# Patient Record
Sex: Female | Born: 1950 | Race: White | Hispanic: No | Smoking: Never smoker
Health system: Southern US, Community
[De-identification: ages and names within clinical notes are randomized; demographics above are authoritative.]

## PROBLEM LIST (undated history)

## (undated) DIAGNOSIS — W5911XA Bitten by nonvenomous snake, initial encounter: Secondary | ICD-10-CM

## (undated) DIAGNOSIS — K649 Unspecified hemorrhoids: Secondary | ICD-10-CM

## (undated) DIAGNOSIS — E876 Hypokalemia: Secondary | ICD-10-CM

## (undated) DIAGNOSIS — F200 Paranoid schizophrenia: Secondary | ICD-10-CM

## (undated) DIAGNOSIS — I10 Essential (primary) hypertension: Secondary | ICD-10-CM

## (undated) DIAGNOSIS — E2740 Unspecified adrenocortical insufficiency: Secondary | ICD-10-CM

## (undated) DIAGNOSIS — R531 Weakness: Secondary | ICD-10-CM

## (undated) DIAGNOSIS — C541 Malignant neoplasm of endometrium: Secondary | ICD-10-CM

## (undated) DIAGNOSIS — B377 Candidal sepsis: Secondary | ICD-10-CM

## (undated) DIAGNOSIS — G4700 Insomnia, unspecified: Secondary | ICD-10-CM

## (undated) DIAGNOSIS — I8291 Chronic embolism and thrombosis of unspecified vein: Secondary | ICD-10-CM

## (undated) HISTORY — PX: TUBAL LIGATION: SHX77

---

## 2002-02-19 ENCOUNTER — Emergency Department (HOSPITAL_COMMUNITY): Admission: EM | Admit: 2002-02-19 | Discharge: 2002-02-19 | Payer: Self-pay | Admitting: Emergency Medicine

## 2002-10-17 ENCOUNTER — Inpatient Hospital Stay (HOSPITAL_COMMUNITY): Admission: AD | Admit: 2002-10-17 | Discharge: 2002-10-26 | Payer: Self-pay | Admitting: Psychiatry

## 2002-11-02 ENCOUNTER — Encounter: Admission: RE | Admit: 2002-11-02 | Discharge: 2002-11-02 | Payer: Self-pay | Admitting: Psychiatry

## 2003-11-26 ENCOUNTER — Inpatient Hospital Stay (HOSPITAL_COMMUNITY): Admission: RE | Admit: 2003-11-26 | Discharge: 2003-12-07 | Payer: Self-pay | Admitting: Psychiatry

## 2009-07-12 ENCOUNTER — Emergency Department (HOSPITAL_BASED_OUTPATIENT_CLINIC_OR_DEPARTMENT_OTHER): Admission: EM | Admit: 2009-07-12 | Discharge: 2009-07-12 | Payer: Self-pay | Admitting: Emergency Medicine

## 2009-07-13 ENCOUNTER — Ambulatory Visit: Payer: Self-pay | Admitting: Diagnostic Radiology

## 2009-07-13 ENCOUNTER — Emergency Department (HOSPITAL_BASED_OUTPATIENT_CLINIC_OR_DEPARTMENT_OTHER): Admission: EM | Admit: 2009-07-13 | Discharge: 2009-07-13 | Payer: Self-pay | Admitting: Emergency Medicine

## 2010-07-16 LAB — DIFFERENTIAL
Basophils Absolute: 0.1 10*3/uL (ref 0.0–0.1)
Basophils Relative: 1 % (ref 0–1)
Monocytes Relative: 4 % (ref 3–12)
Neutro Abs: 10.4 10*3/uL — ABNORMAL HIGH (ref 1.7–7.7)

## 2010-07-16 LAB — URINALYSIS, ROUTINE W REFLEX MICROSCOPIC
Bilirubin Urine: NEGATIVE
Glucose, UA: NEGATIVE mg/dL
Hgb urine dipstick: NEGATIVE
Protein, ur: NEGATIVE mg/dL
pH: 5 (ref 5.0–8.0)

## 2010-07-16 LAB — COMPREHENSIVE METABOLIC PANEL
AST: 28 U/L (ref 0–37)
Albumin: 4.5 g/dL (ref 3.5–5.2)
BUN: 18 mg/dL (ref 6–23)
CO2: 24 mEq/L (ref 19–32)
Chloride: 106 mEq/L (ref 96–112)
GFR calc non Af Amer: 51 mL/min — ABNORMAL LOW (ref >60)
Glucose, Bld: 113 mg/dL — ABNORMAL HIGH (ref 70–99)
Total Bilirubin: 0.5 mg/dL (ref 0.3–1.2)
Total Protein: 8 g/dL (ref 6.0–8.3)

## 2010-07-16 LAB — CBC
HCT: 41.1 % (ref 36.0–46.0)
Hemoglobin: 13.9 g/dL (ref 12.0–15.0)
MCHC: 33.7 g/dL (ref 30.0–36.0)
MCV: 89.4 fL (ref 78.0–100.0)
RBC: 4.59 MIL/uL (ref 3.87–5.11)

## 2010-07-16 LAB — ETHANOL: Alcohol, Ethyl (B): <5 mg/dL (ref 0–10)

## 2010-07-16 LAB — POCT TOXICOLOGY PANEL

## 2010-09-07 NOTE — H&P (Signed)
NAME:  Emily Duarte, Emily Duarte                    ACCOUNT NO.:  [id number]   MEDICAL RECORD NO.:  [id number]                   PATIENT TYPE:  IPS   LOCATION:  0405                                 FACILITY:  [facility]   PHYSICIAN:  Jeanice Lim, M.D.              DATE OF BIRTH:  [birth year]   DATE OF ADMISSION:  10/17/2002  DATE OF DISCHARGE:                         PSYCHIATRIC ADMISSION ASSESSMENT   IDENTIFYING INFORMATION:  This is a 60 year old single white female  involuntarily committed on October 17, 2002.   HISTORY OF PRESENT ILLNESS:  The patient presents with psychotic symptoms.  Commitment papers report that patient has a history of schizophrenia, has  been noncompliant, is manic, grandiosity, and patient is afraid of people  that she lives with.  The patient states that she seen a mouse in her home  for about a month and this gets her very concerned.  She reports she has  been having these clandestine experiences with the [employer], who had taken her  from her work to [place] on several occasions.  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  She denies any specific  stressors in her life.  She reports that she has been sleeping  satisfactorily.  Her appetite has been fair.  Denies any hallucinations,  auditory or perceiveral hallucinations and denies any current suicidal or  homicidal ideation.   PAST PSYCHIATRIC HISTORY:  Second visit to [facility].  At  [facility] for about 10 months about five years ago, when she states she  was waiting for her disability.  She reports no current outpatient  treatment.  She reports that she was diagnosed as bipolar when she was at  [employer].   SOCIAL HISTORY:  This is a 60 year old single white female.  She has a 13-  year-old son.  She lives alone.  She is retired.  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  She reports no legal problems.   FAMILY HISTORY:  None.   ALCOHOL/DRUG  HISTORY:  She is a nonsmoker.  She states her last beer was  five years ago and denies any recreational drug use.   PRIMARY CARE PHYSICIAN:  None.   MEDICAL PROBLEMS:  None.   MEDICATIONS:  None.  She was on lithium in the past, where she states she  had an out-of-body experience.   ALLERGIES:  No known allergies.   PHYSICAL EXAMINATION:  Done at [facility] Emergency Department.  The patient  presents overweight with no complaints.  Does not appear in any acute  distress.  Last available vital signs were temperature 98.7, heart rate 115,  respirations 22, blood pressure 144/84.  She is 232 pounds.  She is 5 feet 7-  1/2 inches tall.   LABORATORY DATA:  CMET shows glucose 164.  Alcohol level less than 5.  Urine  drug screen was negative.   MENTAL STATUS  EXAM:  She is an alert, cooperative female.  Good eye contact.  Dressed rather [employer].  Speech is clear with some flight of ideas.  She is  articulate and hyperverbal.  Mood, patient states she feels great.  Her  affect is constricted.  Thought processes are with paranoid ideation,  grandiose and with some delusions.  Does not appear to be responding to  internal stimuli.  No suicidal and homicidal thoughts.  Cognitive function  intact.  Memory is fair.  Judgment is fair.  Insight is poor.   DIAGNOSES:   AXIS I:  1. Psychosis not otherwise specified.  2. Rule out bipolar, manic.   AXIS II:  Deferred.   AXIS III:  None.   AXIS IV:  Other psychosocial problems.   AXIS V:  Current 25; estimated this past year 72.   PLAN:  Involuntary commitment for psychotic symptoms, manic behavior.  Contract for safety.  Check every 15 minutes.  Will stabilize mood and  thinking so patient can be safe and functional.  Will initiate an  antipsychotic to decrease psychotic symptoms and add a mood stabilizer for  mood stability.  The patient is to be medication-compliant.  Will have a  family session with son prior to discharge.  The patient  is to follow up  with mental health.   TENTATIVE LENGTH OF STAY:  Three to five days.     Landry Corporal, N.P.                       Jeanice Lim, M.D.    JO/MEDQ  D:  10/18/2002  T:  10/18/2002  Job:  [id number]

## 2010-09-07 NOTE — Discharge Summary (Signed)
NAME:  LAQUETA, BONAVENTURA                    ACCOUNT NO.:  [id number]   MEDICAL RECORD NO.:  [id number]                   PATIENT TYPE:  IPS   LOCATION:  0405                                 FACILITY:  [facility]   PHYSICIAN:  Geoffery Lyons, M.D.                   DATE OF BIRTH:  10-04-1950   DATE OF ADMISSION:  10/17/2002  DATE OF DISCHARGE:  10/26/2002                                 DISCHARGE SUMMARY   CHIEF COMPLAINT AND PRESENT ILLNESS:  This was the first admission to [facility] for this 60 year old single white female  involuntarily committed.  Presented with psychotic symptoms.  History of a  psychotic disorder.  Noncompliant.  Evidenced grandiosity, some paranoia  associated to the people that she was living with.  She stated that she saw  a mouse in her home for about a month.  This got her very concerned having  claimed that there is some clandestine experience with the FBI who had taken  her from her work to [place] on several occasions.  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  Denies any hallucinations or  suicidal or homicidal ideation.   PAST PSYCHIATRIC HISTORY:  [facility] for about 10 months five years ago.  Diagnosed bipolar when she was there.   ALCOHOL/DRUG HISTORY:  Denies the use or abuse of any substances.   PAST MEDICAL HISTORY:  Noncontributory.   MEDICATIONS:  Had been on lithium in the past.   PHYSICAL EXAMINATION:  Performed and failed to show any acute findings.   MENTAL STATUS EXAMINATION:  Alert, cooperative female with good eye contact.  Dressed rather [employer].  Speech is clear.  Some flight of ideas.  Articulate.  Hyperverbal.  Mood claims she feels great.  Her affect is constricted.  Some  pressured speech can be interrupted.  Thought processes with evidence of  paranoid ideation, some grandiosity.  Did not appear to be responding to  internal stimuli.  Denied any suicidal or homicidal ideation.   Cognition  well-preserved.   ADMISSION DIAGNOSES:   AXIS I:  Rule out schizoaffective disorder.   AXIS II:  No diagnosis.   AXIS III:  No diagnosis.   AXIS IV:  Moderate.   AXIS V:  Global Assessment of Functioning upon admission 25; highest Global  Assessment of Functioning in the last year 60.   LABORATORY DATA:  Blood chemistry within normal limits.  Thyroid profile  within normal limits.   HOSPITAL COURSE:  She was admitted and started intensive individual and  group psychotherapy.  She was placed on Risperdal and Depakote.  Depakote  dose was adjusted up to 500 mg twice a day and Risperdal was increased as  tolerated up to 4 mg at night.  She initially was evidencing pressured  speech, circumstantiality, flight of ideas, matter of fact discourse,  grandiosity, paranoia, wanting to reopen her case at work.  Initially a lot  of thoughts coming to her mind.  Issues having to do with her son's in-laws.  Worried about placement, going back to the apartment where she saw the mouse  versus a new place.  Maybe some insight that she could not control her  thinking.  Still grandiose, having special abilities, high IQ, out of the  ordinary ability to persist, extraordinary life experiences, concerned about  conflict with the son as he felt that he was going to be in the middle  between his wife and the in-laws and her.  Had a session with the son that  went well.  He was supportive.  He found another place for her.  As the  hospitalization progressed and we adjusted her medication, gradually she  started to get better.  The underlying paranoia seemed to be baseline and,  according to the son, she was basically at her baseline.  Willing to  continue her medications.  Looking forward to a new place to live.  On September 26, 2002, she was much better.  She admitted that she was thinking clearer.  Was going to stay on the medications.  Issues with the daughter-in-law still  present but she  was amiable to maybe consider working things out.   DISCHARGE DIAGNOSES:   AXIS I:  Schizoaffective disorder, manic.   AXIS II:  No diagnosis.   AXIS III:  No diagnosis.   AXIS IV:  Moderate.   AXIS V:  Global Assessment of Functioning upon discharge 55.   DISCHARGE MEDICATIONS:  1. Depakote ER 500 mg twice a day.  2. Risperdal 4 mg at night.   FOLLOW UP:  [facility].                                               Geoffery Lyons, M.D.    IL/MEDQ  D:  11/24/2002  T:  11/24/2002  Job:  [id number]

## 2010-09-07 NOTE — Discharge Summary (Signed)
NAME:  Emily Duarte, Emily Duarte                    ACCOUNT NO.:  [id number]   MEDICAL RECORD NO.:  [id number]                   PATIENT TYPE:  IPS   LOCATION:  0401                                 FACILITY:  [facility]   PHYSICIAN:  Geoffery Lyons, M.D.                   DATE OF BIRTH:  10-22-1950   DATE OF ADMISSION:  11/26/2003  DATE OF DISCHARGE:  12/07/2003                                 DISCHARGE SUMMARY   CHIEF COMPLAINT AND PRESENT ILLNESS:  This was the second admission to [facility] for this 60 year old divorced white female  voluntarily admitted.  Presented for admission walking to [facility].  Claimed I'm here to get calmed down.  Risperdal did such a great  job last year.  Currently manic, delusional, pressured speech, loose  associations, paranoid ideas.  Can be cooperative.  Does need mental health  treatment.  She was reporting positive suicidal ideation with no plan.  About a week after her prior admission here, she quit her medications.  She  thinks that snakes are trying to get her.  Stated that she did  ______________ for [employer].  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  She was an inpatient at [facility] some time  ago in 1999.  She was inpatient at [facility] on October 17, 2002 to October 26, 2002.  Discharged on Risperdal and Depakote that she has not been taking.   PAST PSYCHIATRIC HISTORY:  As already stated, last hospitalization June and  July of 2004.  Discharged stable on Risperdal and Depakote.  She did not  comply.   ALCOHOL/DRUG HISTORY:  Denies the use or abuse of any substances.   PAST MEDICAL HISTORY:  Noncontributory.   MEDICATIONS:  None.   PHYSICAL EXAMINATION:  Performed and failed to show any acute findings.   MENTAL STATUS EXAM:  Alert female, somewhat unkempt, oriented, pressured  speech, circumstantial, tangential, flight of ideas, delusional, some  underlying paranoia, some  grandiosity but no evidence of hallucinations.  Cognition well-preserved.   ADMISSION DIAGNOSES:   AXIS I:  Schizoaffective disorder versus bipolar disorder with psychotic  features.   AXIS II:  No diagnosis.   AXIS III:  No diagnosis.   AXIS IV:  Moderate.   AXIS V:  Global Assessment of Functioning upon admission 25-30; highest  Global Assessment of Functioning in the last year 70.   LABORATORY DATA:  CBC within normal limits.  Blood chemistry within normal  limits.  Hemoglobin A1C 5.4.  Triglycerides 310.   HOSPITAL COURSE:  She was admitted and started in individual and group  psychotherapy.  She was given Ambien for sleep, Risperdal 0.25 mg twice a  day and 0.5 mg at night.  Risperdal was changed to 2 mg M-Tab at night.  That was increased to 2.5 mg and then 3 mg.  It was later increased to  Risperdal M-Tab 3.5 mg and she was started on Depakote ER 500 mg at bedtime.  The Risperdal was finally increased to 4 mg at night and Depakote to 750 mg.  Initially, endorsed difficulties and conflicts with her daughter-in-law.  Said that she went off medications and she was doing very well.  As of  recently, she claimed that she was bit by a snake.  There has been multiple  events in her thought content.  There is evidence of paranoia, grandiose  ideas, a lot of changing delusional content, some pressured speech.  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  Thought there was a plan to kidnap her and ask for  money.  Apparently, the daughter-in-law and her family was involved in on  it.  There was some developed paranoid ideation, pressured speech,  grandiosity.  But as we started increasing the medications as already  described, she definitely got better.  She started sleeping better.  She was  less pressured.  Got to a point where she could not find things to talk  about.  There was a family session with the son.  She was  agreeable to allow  her son in her life and wanted to give the daughter-in-law another chance.  She was challenging her own misperceptions and willing to look at the way  she was looking at things.  On August 11, 200t, she was much better, in more  contact with reality, marked decrease in her delusional ideas, no suicidal  ideation, no homicidal ideation, no hallucinations, marked decrease in the  delusions, willing to continue to be in a relationship with the son and the  son's wife and willing to continue taking her medications.   DISCHARGE DIAGNOSES:   AXIS I:  Schizoaffective disorder.   AXIS II:  No diagnosis.   AXIS III:  No diagnosis.   AXIS IV:  Moderate.   AXIS V:  Global Assessment of Functioning upon discharge 50-55.   DISCHARGE MEDICATIONS:  1.  Depakote ER 750 mg at night.  2.  Risperdal 4 mg at bedtime.   FOLLOW UP:  Dr. Lolly Mustache at [facility].                                               Geoffery Lyons, M.D.    IL/MEDQ  D:  12/28/2003  T:  12/30/2003  Job:  [id number]

## 2010-09-07 NOTE — H&P (Signed)
NAME:  Emily Duarte, SIEVERS                    ACCOUNT NO.:  [id number]   MEDICAL RECORD NO.:  [id number]                   PATIENT TYPE:  IPS   LOCATION:  0406                                 FACILITY:  [facility]   PHYSICIAN:  Vic Ripper, P.A.-C.         DATE OF BIRTH:  17-Sep-1950   DATE OF ADMISSION:  11/26/2003  DATE OF DISCHARGE:                         PSYCHIATRIC ADMISSION ASSESSMENT   PATIENT IDENTIFICATION:  This is a 60 year old divorced white female.  This  is a voluntary admission.   HISTORY OF PRESENT ILLNESS:  The patient apparently presented for admission  as a walk in yesterday here at [facility].  She stated, I'm  here to get calmed down.  Risperdal did such a great job last year.  She is  currently manic.  She is delusional with pressured speech, loose  associations, and paranoid ideation.  She can be cooperative.  She realizes  that she does need mental health treatment.  Yesterday, she was reporting  passive suicidal ideation with no plan.  She states that about a week after  her prior admission here, she quit her medications.  She thinks that snakes  are trying to get her.  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  Apparently, she was an inpatient at [facility] some time ago, it  states 1999.  She is willing to sign a release to get those records.  She  was an inpatient here October 17, 2002, to October 26, 2002.  She discharged on  Risperdal and Depakote but she has not been taking.  She denies any family  history for mental illness.   SUBSTANCE ABUSE HISTORY:  She denies any drug or substance abuse.   PAST MEDICAL HISTORY:  She denies having a primary care Jairo Bellew.  She said  Dr. _________ was her gynecologist years ago.  He has retired.  She denied  any medical problems and she denied being prescribed any medication.   DRUG ALLERGIES:  She also has no  known drug allergies.   PHYSICAL EXAMINATION:  GENERAL:  She did allow me a cursory physical  examination.  Her heart and lungs were clear to auscultation and percussion.  She had a wig on.  She would not let me look under the wig.  Her glasses  were broken.  But other than being obese, there were no remarkable physical  findings.   MENTAL STATUS EXAM:  She is alert.  She is unkempt.  She is somewhat  oriented.  She knows what year it is and where she is.  Her speech is  pressured.  She is tangential.  She has flight of ideas.  She delusional and  somewhat paranoid.  She points to two little cherry spots on her legs and  claims that this is from a snake.  Her thought process is not clear.  She is  not rational.  She has some judgment and insight.  She knew she could come  here to get help.  Her concentration and memory are poor; due to her flight  of ideas, she keeps changing.  Her intelligence is probably average.   ADMISSION DIAGNOSES:   AXIS I:  Schizoaffective disorder bipolar versus bipolar manic.   AXIS II:  Deferred.   AXIS III:  Obese.   AXIS IV:  Moderate to severe.   AXIS V:   INITIAL PLAN OF CARE:  The plan is to address her manic symptoms.  She was  started on Risperdal yesterday.  She does not want medication if she can  help it, so she says.  I will increase the dosage of Risperdal tonight to  try to help control her mania.   ESTIMATED LENGTH OF STAY:  Tentative length of stay is probably a week.                                               Vic Ripper, P.A.-C.    MD/MEDQ  D:  11/27/2003  T:  11/28/2003  Job:  [id number]

## 2012-08-06 ENCOUNTER — Encounter (HOSPITAL_COMMUNITY): Payer: Self-pay | Admitting: Emergency Medicine

## 2012-08-06 ENCOUNTER — Emergency Department (HOSPITAL_COMMUNITY)
Admission: EM | Admit: 2012-08-06 | Discharge: 2012-08-06 | Disposition: A | Discharge status: Other Acute Inpatient Hospital | Payer: Medicare Other | Source: Home / Self Care | Attending: Emergency Medicine | Admitting: Emergency Medicine

## 2012-08-06 ENCOUNTER — Inpatient Hospital Stay (HOSPITAL_COMMUNITY)
Admission: AD | Admit: 2012-08-06 | Discharge: 2012-08-14 | DRG: 885 | Disposition: A | Discharge status: Home / Self Care | Payer: Medicare Other | Source: Intra-hospital | Attending: Psychiatry | Admitting: Psychiatry

## 2012-08-06 DIAGNOSIS — L723 Sebaceous cyst: Secondary | ICD-10-CM | POA: Diagnosis present

## 2012-08-06 DIAGNOSIS — L02818 Cutaneous abscess of other sites: Secondary | ICD-10-CM | POA: Insufficient documentation

## 2012-08-06 DIAGNOSIS — F3163 Bipolar disorder, current episode mixed, severe, without psychotic features: Secondary | ICD-10-CM | POA: Diagnosis present

## 2012-08-06 DIAGNOSIS — F3289 Other specified depressive episodes: Secondary | ICD-10-CM | POA: Insufficient documentation

## 2012-08-06 DIAGNOSIS — M7989 Other specified soft tissue disorders: Secondary | ICD-10-CM

## 2012-08-06 DIAGNOSIS — F319 Bipolar disorder, unspecified: Secondary | ICD-10-CM

## 2012-08-06 DIAGNOSIS — H5316 Psychophysical visual disturbances: Secondary | ICD-10-CM | POA: Insufficient documentation

## 2012-08-06 DIAGNOSIS — F22 Delusional disorders: Secondary | ICD-10-CM | POA: Diagnosis present

## 2012-08-06 DIAGNOSIS — L03818 Cellulitis of other sites: Secondary | ICD-10-CM | POA: Insufficient documentation

## 2012-08-06 DIAGNOSIS — F329 Major depressive disorder, single episode, unspecified: Secondary | ICD-10-CM | POA: Insufficient documentation

## 2012-08-06 DIAGNOSIS — Z79899 Other long term (current) drug therapy: Secondary | ICD-10-CM

## 2012-08-06 DIAGNOSIS — F309 Manic episode, unspecified: Secondary | ICD-10-CM

## 2012-08-06 DIAGNOSIS — F3164 Bipolar disorder, current episode mixed, severe, with psychotic features: Principal | ICD-10-CM | POA: Diagnosis present

## 2012-08-06 LAB — COMPREHENSIVE METABOLIC PANEL
CO2: 25 mEq/L (ref 19–32)
GFR calc non Af Amer: 59 mL/min — ABNORMAL LOW (ref >90)
Glucose, Bld: 94 mg/dL (ref 70–99)
Sodium: 137 mEq/L (ref 135–145)
Total Protein: 7.9 g/dL (ref 6.0–8.3)

## 2012-08-06 LAB — ACETAMINOPHEN LEVEL: Acetaminophen (Tylenol), Serum: <15 ug/mL (ref 10–30)

## 2012-08-06 LAB — CBC
HCT: 37.8 % (ref 36.0–46.0)
Hemoglobin: 12.9 g/dL (ref 12.0–15.0)
MCHC: 34.1 g/dL (ref 30.0–36.0)
MCV: 85.9 fL (ref 78.0–100.0)
Platelets: 320 10*3/uL (ref 150–400)
RBC: 4.4 MIL/uL (ref 3.87–5.11)
RDW: 13.4 % (ref 11.5–15.5)
WBC: 8.9 10*3/uL (ref 4.0–10.5)

## 2012-08-06 LAB — RAPID URINE DRUG SCREEN, HOSP PERFORMED
Amphetamines: NOT DETECTED
Barbiturates: NOT DETECTED
Benzodiazepines: NOT DETECTED
Cocaine: NOT DETECTED
Tetrahydrocannabinol: NOT DETECTED

## 2012-08-06 MED ORDER — CEPHALEXIN 250 MG PO CAPS
500.0000 mg | ORAL_CAPSULE | Freq: Three times a day (TID) | ORAL | Status: DC
  Administered 2012-08-06 (×2): 500 mg via ORAL
  Filled 2012-08-06 (×2): qty 2

## 2012-08-06 MED ORDER — ONDANSETRON HCL 8 MG PO TABS: 4.0000 mg | ORAL_TABLET | Freq: Three times a day (TID) | ORAL | Status: DC | PRN

## 2012-08-06 MED ORDER — ACETAMINOPHEN 325 MG PO TABS: 650.0000 mg | ORAL_TABLET | ORAL | Status: DC | PRN

## 2012-08-06 MED ORDER — ALUM & MAG HYDROXIDE-SIMETH 200-200-20 MG/5ML PO SUSP: 30.0000 mL | ORAL | Status: DC | PRN

## 2012-08-06 NOTE — ED Notes (Signed)
Pt here to have cyst checked on her head; pt with son; pt obviously manic acting at present with flight of ideas; per son pt with hx of same; per son pt worsening x 3 weeks; pt sts has secluded herself in bathroom because she feels safe there

## 2012-08-06 NOTE — ED Notes (Signed)
Report given to [employer]. Pt transferred to Pod C.

## 2012-08-06 NOTE — ED Notes (Signed)
Bed avalable in [facility]

## 2012-08-06 NOTE — ED Provider Notes (Signed)
History    This chart was scribed for non-physician practitioner working with Emily Cooper III, MD by Sofie Rower, ED Scribe. This patient was seen in room TR10C/TR10C and the patient's care was started at 15:57PM.   CSN: [id number]  Arrival date & time 08/06/12  1452   First MD Initiated Contact with Patient 08/06/12 1557      Chief Complaint  Patient presents with  . Medical Clearance  . Wound Check    (Consider location/radiation/quality/duration/timing/severity/associated sxs/prior treatment) Patient is a 62 y.o. female presenting with wound check and mental health disorder. The history is provided by the patient. No language interpreter was used.  Wound Check This is a new problem. The current episode started more than 1 week ago. The problem occurs rarely. The problem has not changed since onset.Pertinent negatives include no chest pain, no abdominal pain, no headaches and no shortness of breath. Nothing aggravates the symptoms. Nothing relieves the symptoms.  Mental Health Problem Severity:  Moderate Most recent episode:  More than 2 days ago Episode history:  Continuous Timing:  Constant Progression:  Worsening Chronicity:  Chronic Associated symptoms: depression   Associated symptoms: no abdominal pain and no headaches     Emily Duarte is a 62 y.o. female , with a hx of depression and bipolar disorder, who presents to the Emergency Department complaining of gradual, progressively worsening, medical clearance, onset 10 months ago (June, 2013). The pt reports she was bit by an animal on her neck and face in June, 2013, which has caused her a severe amount of distress. The pt's distress has become progressively worse, causing her to lock herself within her bathroom, where she informs she feels safe. Reportedly, animal control has responded to the claim, however, has yet to locate any such animal which may be responsible for the incident. The pt also complains of gradual,  progressively worsening depression. The pt lives at home, alone.  The pt does not smoke, however, she does drink alcohol.   Psychologist is Dr. Yetta Barre.    History reviewed. No pertinent past medical history.  History reviewed. No pertinent past surgical history.  History reviewed. No pertinent family history.  History  Substance Use Topics  . Smoking status: Never Smoker   . Smokeless tobacco: Not on file  . Alcohol Use: Yes    OB History   Grav Para Term Preterm Abortions TAB SAB Ect Mult Living                  Review of Systems  Respiratory: Negative for shortness of breath.   Cardiovascular: Negative for chest pain.  Gastrointestinal: Negative for abdominal pain.  Neurological: Negative for headaches.  All other systems reviewed and are negative.    Allergies  Review of patient's allergies indicates no known allergies.  Home Medications  No current outpatient prescriptions on file.  BP 145/71  Pulse 89  Temp(Src) 97.6 F (36.4 C) (Oral)  Resp 2  SpO2 96%  Physical Exam  Nursing note and vitals reviewed. Constitutional: She is oriented to person, place, and time. She appears well-developed and well-nourished. No distress.  HENT:  Head: Normocephalic.  Abscess located at the right parietal-occipital region of the scalp.   Eyes: EOM are normal.  Neck: Neck supple. No tracheal deviation present.  Cardiovascular: Normal rate.   Pulmonary/Chest: Effort normal. No respiratory distress.  Musculoskeletal: Normal range of motion.  Neurological: She is alert and oriented to person, place, and time.  Skin: Skin is warm and  dry.  Psychiatric: She is actively hallucinating. She exhibits a depressed mood.    ED Course  Procedures (including critical care time)  DIAGNOSTIC STUDIES: Oxygen Saturation is 96% on room air, normal by my interpretation.    COORDINATION OF CARE:   4:10 PM- Treatment plan discussed with patient. Pt agrees with  treatment.  INCISION AND DRAINAGE PROCEDURE NOTE: Patient identification was confirmed and verbal consent was obtained. This procedure was performed by Felicie Morn (NP) at 5:25 PM. Site: scalp Sterile procedures observed  Needle size: 27 Anesthetic used (type and amt): Lidocaine with 2% epi, 2cc Blade size: 11 Drainage: moderate Complexity: Complex Site anesthetized, incision made over site, wound drained and explored loculations, rinsed with copious amounts of normal saline. Covered with sterile gauze.  Pt tolerated procedure well without complications.    Results for orders placed during the hospital encounter of 08/06/12  ACETAMINOPHEN LEVEL      Result Value Range   Acetaminophen (Tylenol), Serum <15.0  10 - 30 ug/mL  CBC      Result Value Range   WBC 8.9  4.0 - 10.5 K/uL   RBC 4.40  3.87 - 5.11 MIL/uL   Hemoglobin 12.9  12.0 - 15.0 g/dL   HCT 45.4  09.8 - 11.9 %   MCV 85.9  78.0 - 100.0 fL   MCH 29.3  26.0 - 34.0 pg   MCHC 34.1  30.0 - 36.0 g/dL   RDW 14.7  82.9 - 56.2 %   Platelets 320  150 - 400 K/uL  COMPREHENSIVE METABOLIC PANEL      Result Value Range   Sodium 137  135 - 145 mEq/L   Potassium 4.3  3.5 - 5.1 mEq/L   Chloride 101  96 - 112 mEq/L   CO2 25  19 - 32 mEq/L   Glucose, Bld 94  70 - 99 mg/dL   BUN 15  6 - 23 mg/dL   Creatinine, Ser 1.30  0.50 - 1.10 mg/dL   Calcium 9.1  8.4 - 86.5 mg/dL   Total Protein 7.9  6.0 - 8.3 g/dL   Albumin 4.3  3.5 - 5.2 g/dL   AST 18  0 - 37 U/L   ALT 13  0 - 35 U/L   Alkaline Phosphatase 102  39 - 117 U/L   Total Bilirubin 0.4  0.3 - 1.2 mg/dL   GFR calc non Af Amer 59 (*) >90 mL/min   GFR calc Af Amer 69 (*) >90 mL/min  ETHANOL      Result Value Range   Alcohol, Ethyl (B) <11  0 - 11 mg/dL  SALICYLATE LEVEL      Result Value Range   Salicylate Lvl <2.0 (*) 2.8 - 20.0 mg/dL  URINE RAPID DRUG SCREEN (HOSP PERFORMED)      Result Value Range   Opiates NONE DETECTED  NONE DETECTED   Cocaine NONE DETECTED  NONE  DETECTED   Benzodiazepines NONE DETECTED  NONE DETECTED   Amphetamines NONE DETECTED  NONE DETECTED   Tetrahydrocannabinol NONE DETECTED  NONE DETECTED   Barbiturates NONE DETECTED  NONE DETECTED       No results found.   No diagnosis found.  Patient in manic phase of bipolar disorder.  Flight of ideas.   Scalp abscess, I&D with antibiotic coverage. Patient seen and evaluated by ACT, patient accepted for admission at [facility]. Patient moved to pod C awaiting disposition, temporary orders entered.   MDM      I  personally performed the services described in this documentation, which was scribed in my presence. The recorded information has been reviewed and is accurate.     Jimmye Norman, NP 08/07/12 [phone]

## 2012-08-06 NOTE — BH Assessment (Addendum)
Assessment Note   Emily Duarte is an 62 y.o. female with a hx of Bipolar Disorder, who presents to the ED complaining of bites from a snake with onset 10 months ago (June, 2013 per pt). The pt reports she was bitten by an animal on her neck (a rattle snake) and face in June, 2013, which has caused her a severe amount of distress. The pt's distress has become progressively worse, causing her to lock herself within her bathroom, where she informs she feels safe. Reportedly, animal control and police have responded to the claim, however, has yet to locate any such animal which may be responsible for the incident. The pt stated she has several wounds that have been bleeding that she has treated with Tide and bleach.  Pt smells of body odor and bleach.  Pt appears disheveled and has rapid, pressured, and tangential speech with flight of ideas.  Pt appears to have somatic, persecutory, and grandiose delusions, believing she has "special art" in her room that cannot be moved and stating such things as "I have industry needs."  Pt reports she has been drinking "1 cap full" of Wild [place] Rose to help "alter my mental status" so she can tolerate the snake that keeps getting into her home to bite her.  Pt believes she was "designed" into her apartment, but she must now "abandon" because the snake is too overwhelming for her.  Pt stated he bedspread heals her and that she has to choose colors of "grapevine" and she has no choice in the colors that are in her bedspread.  Pt stated she is surrounded by "milions of dollars of atmosphere" and "I appreciate commerce."  Pt stated she feels comfort locked in her bathroom.  Pt admits to paranoia.  Pt stated she sees shadows that "are not what they appear."  Per her son, she has taken no medications since being at [facility] in 2005 and he brought her out of concern.  Pt was prescribed Risperdal and Depakote, stating the medicines worked "well" but that she felt too sedated.  Pt was  previously at [facility] in 1999 for psychosis as well.  The pt also complains of gradual, progressively worsening depression. Pt stated she has been eating and sleeping well, stating "I crash great."  Pt denies SI or HI.  The pt lives at home alone, but her son is present and supportive.  Pt knows she needs inpatient treatment and is in agreement with being admitted to an inpatient psychiatric facility.  Completed assessment, assessment request form, and faxed to [facility] to run for possible admission.  Updated ED staff.  Pt placed under IVC by EDP Ignacia Palma for her safety.   Axis I: 296.64 Bipolar Disorder, Most Recent Episode Mixed, Severe With Psychotic Features Axis II: Deferred Axis III: History reviewed. No pertinent past medical history. Axis IV: other psychosocial or environmental problems Axis V: 11-20 some danger of hurting self or others possible OR occasionally fails to maintain minimal personal hygiene OR gross impairment in communication  Past Medical History: History reviewed. No pertinent past medical history.  History reviewed. No pertinent past surgical history.  Family History: History reviewed. No pertinent family history.  Social History:  reports that she has never smoked. She does not have any smokeless tobacco history on file. She reports that  drinks alcohol. She reports that she does not use illicit drugs.  Additional Social History:  Alcohol / Drug Use Pain Medications: none Prescriptions: none Over the Counter: none  History of alcohol / drug use?: Yes Longest period of sobriety (when/how long): unknown Negative Consequences of Use:  (pt denies) Withdrawal Symptoms:  (pt denies) Substance #1 Name of Substance 1: ETOH - Red [place] Rose 1 - Age of First Use: 4 years ago per pt 1 - Amount (size/oz): "1 cap full in 4 bottles" 1 - Frequency: unknown 1 - Duration: 4 years per pt 1 - Last Use / Amount: unknown  CIWA: CIWA-Ar BP: 145/71 mmHg Pulse Rate: 89 COWS:     Allergies: No Known Allergies  Home Medications:  (Not in a hospital admission)  OB/GYN Status:  No LMP recorded.  General Assessment Data Location of Assessment: [facility] ED Living Arrangements: Alone Can pt return to current living arrangement?: Yes Admission Status: Voluntary Is patient capable of signing voluntary admission?: Yes Transfer from: Acute Hospital Referral Source: Self/Family/Friend  Education Status Is patient currently in school?: No  Risk to self Suicidal Ideation: No Suicidal Intent: No Is patient at risk for suicide?: No Suicidal Plan?: No Access to Means: No What has been your use of drugs/alcohol within the last 12 months?: pt reports she drinks "a cap full" of [place] Rose at times Previous Attempts/Gestures: No How many times?: 0 Other Self Harm Risks: pt denies Triggers for Past Attempts: None known Intentional Self Injurious Behavior: None Family Suicide History: No Recent stressful life event(s): Recent negative physical changes;Turmoil (Comment) (Abscess, off of medications, decompensation) Persecutory voices/beliefs?: Yes Depression: Yes Depression Symptoms: Despondent;Insomnia;Isolating Substance abuse history and/or treatment for substance abuse?: No Suicide prevention information given to non-admitted patients: Not applicable  Risk to Others Homicidal Ideation: No Thoughts of Harm to Others: No Current Homicidal Intent: No Current Homicidal Plan: No Access to Homicidal Means: No Identified Victim: pt denies History of harm to others?: No Assessment of Violence: None Noted Violent Behavior Description: na - pt calm, cooperative Does patient have access to weapons?: No Criminal Charges Pending?: No Does patient have a court date: No  Psychosis Hallucinations: Tactile;Visual;With command (sees shadows, rattlesnake, thinks rattlesnake biting her) Delusions: Grandiose;Persecutory;Somatic (Paranoia, feels "intelligent" snake is biting  her)  Mental Status Report Appear/Hygiene: Bizarre;Disheveled;Body odor;Other (Comment) (Smells of bleach) Eye Contact: Good Motor Activity: Freedom of movement;Unremarkable Speech: Rapid;Pressured;Tangential Level of Consciousness: Alert Mood: Anxious;Suspicious;Preoccupied Affect: Anxious;Apprehensive;Frightened;Preoccupied Anxiety Level: Moderate Thought Processes: Circumstantial;Tangential;Flight of Ideas Judgement: Impaired Orientation: Person;Place Obsessive Compulsive Thoughts/Behaviors: Severe  Cognitive Functioning Concentration: Decreased Memory: Remote Impaired;Recent Intact IQ: Average Insight: Poor Impulse Control: Fair Appetite: Fair Weight Loss: 0 Weight Gain: 0 Sleep: No Change Total Hours of Sleep:  ("I crash great") Vegetative Symptoms: Not bathing;Decreased grooming  ADLScreening [facility] Assessment Services) Patient's cognitive ability adequate to safely complete daily activities?: Yes Patient able to express need for assistance with ADLs?: Yes Independently performs ADLs?: Yes (appropriate for developmental age)  Abuse/Neglect [facility]) Physical Abuse: Denies Verbal Abuse: Denies Sexual Abuse: Denies  Prior Inpatient Therapy Prior Inpatient Therapy: Yes Prior Therapy Dates: 2004, 2012 Prior Therapy Facilty/Provider(s): [facility], [facility] Reason for Treatment: Psychosis/Bipolar Disorder - ERROR - Pt at [facility] in 1999 and [facility] 2004 and 2005!!  Prior Outpatient Therapy Prior Outpatient Therapy: Yes Prior Therapy Dates: Unknown Prior Therapy Facilty/Provider(s): Dr. Yetta Barre - PCP? Reason for Treatment: Med mgnt  ADL Screening (condition at time of admission) Patient's cognitive ability adequate to safely complete daily activities?: Yes Patient able to express need for assistance with ADLs?: Yes Independently performs ADLs?: Yes (appropriate for developmental age)  Home Assistive Devices/Equipment Home Assistive Devices/Equipment: None  Abuse/Neglect Assessment (Assessment to be complete while patient is alone) Physical Abuse: Denies Verbal Abuse: Denies Sexual Abuse: Denies Exploitation of patient/patient's resources: Denies Self-Neglect: Denies Values / Beliefs Cultural Requests During Hospitalization: None Spiritual Requests During Hospitalization: None Consults Spiritual Care Consult Needed: No Social Work Consult Needed: No Merchant navy officer (For Healthcare) Advance Directive: Patient does not have advance directive;Patient would not like information    Additional Information 1:1 In Past 12 Months?: No CIRT Risk: No Elopement Risk: No Does patient have medical clearance?: Yes     Disposition:  Disposition Initial Assessment Completed for this Encounter: Yes Disposition of Patient: Referred to;Inpatient treatment program Type of inpatient treatment program: Adult Patient referred to: Other (Comment) ([facility])  On Site Evaluation by:   Reviewed with Physician:  Bryson Ha, Rennis Harding 08/06/2012 6:06 PM

## 2012-08-06 NOTE — ED Notes (Signed)
Pt has large, crusty wound on top of her head. States she cut all her hair off because "that snake bit me on the head".

## 2012-08-06 NOTE — Progress Notes (Signed)
Patient accepted to [facility] by Bernadene Bell, NP. Rosey Bath, RN

## 2012-08-07 ENCOUNTER — Encounter (HOSPITAL_COMMUNITY): Payer: Self-pay

## 2012-08-07 DIAGNOSIS — M7989 Other specified soft tissue disorders: Secondary | ICD-10-CM | POA: Diagnosis present

## 2012-08-07 DIAGNOSIS — F3163 Bipolar disorder, current episode mixed, severe, without psychotic features: Secondary | ICD-10-CM | POA: Diagnosis present

## 2012-08-07 MED ORDER — RISPERIDONE 1 MG PO TABS
1.0000 mg | ORAL_TABLET | Freq: Every day | ORAL | Status: DC
  Administered 2012-08-07 – 2012-08-09 (×4): 1 mg via ORAL
  Filled 2012-08-07 (×6): qty 1

## 2012-08-07 MED ORDER — ACETAMINOPHEN 325 MG PO TABS: 650.0000 mg | ORAL_TABLET | Freq: Four times a day (QID) | ORAL | Status: DC | PRN

## 2012-08-07 MED ORDER — MAGNESIUM HYDROXIDE 400 MG/5ML PO SUSP: 30.0000 mL | Freq: Every day | ORAL | Status: DC | PRN

## 2012-08-07 MED ORDER — ALUM & MAG HYDROXIDE-SIMETH 200-200-20 MG/5ML PO SUSP: 30.0000 mL | ORAL | Status: DC | PRN

## 2012-08-07 MED ORDER — HYDROXYZINE HCL 25 MG PO TABS: 25.0000 mg | ORAL_TABLET | ORAL | Status: DC | PRN

## 2012-08-07 MED ORDER — DIVALPROEX SODIUM 250 MG PO DR TAB
250.0000 mg | DELAYED_RELEASE_TABLET | Freq: Two times a day (BID) | ORAL | Status: DC
  Administered 2012-08-07 – 2012-08-14 (×15): 250 mg via ORAL
  Filled 2012-08-07: qty 6
  Filled 2012-08-07 (×2): qty 1
  Filled 2012-08-07: qty 6
  Filled 2012-08-07 (×10): qty 1
  Filled 2012-08-07: qty 6
  Filled 2012-08-07 (×3): qty 1
  Filled 2012-08-07: qty 6
  Filled 2012-08-07 (×4): qty 1

## 2012-08-07 NOTE — ED Provider Notes (Signed)
Medical screening examination/treatment/procedure(s) were conducted as a shared visit with non-physician practitioner(s) and myself.  I personally evaluated the patient during the encounter Pt with delusions that a rattle snake has been biting her, she has been hiding in her bathroom from it.  PE shows a small abscess on her scalp, otherwise negative.  Involuntary commitment papers completed on pt.  ACT team agrees, will arrange psychiatric hospitalization.  [name], MD 08/07/12 [phone]

## 2012-08-07 NOTE — Progress Notes (Signed)
Nutrition Brief Note  Patient identified on the Malnutrition Screening Tool (MST) Report  Body mass index is 38.6 kg/(m^2). Patient meets criteria for obesity grade 2 based on current BMI.   Current diet order is regular, patient is consuming approximately very good% of meals at this time. Labs and medications reviewed.   Patient reports good intake here and prior to admit.  Stated UBW of 176 lbs with current weight of 225 lbs.  Disorganized comments.  Unable to get a reliable diet hx at this time.  Continue current diet.  No further nutrition interventions warranted at this time. If further nutrition issues arise, please consult RD.   Oran Rein, RD, LDN Clinical Inpatient Dietitian Pager:  [phone] Weekend and after hours pager:  [phone]

## 2012-08-07 NOTE — BHH Counselor (Signed)
Adult Comprehensive Assessment  Patient ID: Emily Duarte, female   DOB: 02/20/1951, 62 y.o.   MRN: [id number]  Information Source: Information source: Patient  Current Stressors:  Educational / Learning stressors: N/A Employment / Job issues: Yes  Occupational issues-not currently productive Family Relationships: N/A Surveyor, quantity / Lack of resources (include bankruptcy): N/A Housing / Lack of housing: N/A Physical health (include injuries & life threatening diseases): N/A Social relationships: Yes  None Substance abuse: N/A Bereavement / Loss: N/A  Living/Environment/Situation:  Living Arrangements: Alone Living conditions (as described by patient or guardian): apartment How long has patient lived in current situation?: location is great What is atmosphere in current home: Chaotic  Family History:  Marital status: Divorced Divorced, when?: 1991 What types of issues is patient dealing with in the relationship?: none Additional relationship information: single since then Does patient have children?: Yes How many children?: 1 (son was born in 35) How is patient's relationship with their children?: good  Childhood History:  By whom was/is the patient raised?: Both parents Additional childhood history information: raised in a loving home Description of patient's relationship with caregiver when they were a child: great Patient's description of current relationship with people who raised him/her: excellent,  They live in [place] Does patient have siblings?: No Did patient suffer any verbal/emotional/physical/sexual abuse as a child?: No Did patient suffer from severe childhood neglect?: No Has patient ever been sexually abused/assaulted/raped as an adolescent or adult?: No Was the patient ever a victim of a crime or a disaster?: No Witnessed domestic violence?: No Has patient been effected by domestic violence as an adult?: No  Education:  Highest grade of school  patient has completed: Business degree from [employer] Currently a Consulting civil engineer?: No Learning disability?: No  Employment/Work Situation:   Employment situation: Unemployed (retired) Patient's job has been impacted by current illness: No What is the longest time patient has a held a job?: 20 years Where was the patient employed at that time?: [employer] Has patient ever been in the [employer]?: No Has patient ever served in Buyer, retail?: No  Financial Resources:   Surveyor, quantity resources: Writer Does patient have a Lawyer or guardian?: No  Alcohol/Substance Abuse:   Has alcohol/substance abuse ever caused legal problems?: No  Social Support System:   Forensic psychologist System: Good Describe Community Support System: Freddie son Type of faith/religion: used to be big in [employer] does patient's faith help to cope with current illness?: my real faith is in metaphysics-I enjoy reading  Leisure/Recreation:   Leisure and Hobbies: watch TV  Strengths/Needs:   What things does the patient do well?: "I'm a philanthropist" In what areas does patient struggle / problems for patient: myself  Discharge Plan:   Does patient have access to transportation?: Yes (walk) Will patient be returning to same living situation after discharge?: Yes Currently receiving community mental health services: No If no, would patient like referral for services when discharged?:  ([place]) Does patient have financial barriers related to discharge medications?: No  Summary/Recommendations:   Summary and Recommendations (to be completed by the evaluator): Emily Duarte is a retired, 62 YO Caucasian female who has a significant history of major mental illness, but has done well for the last 8 years or so.  She has recently become delusional about snakes in the house, and is also disorganized and circumstantial in her thinking.  She has a son who is very involved and supoortive.  She can  benefit from crises  stabilization, medication management, therapeutic milieu and referral for services.  Emily Duarte B. 08/07/2012

## 2012-08-07 NOTE — Progress Notes (Signed)
Adult Psychoeducational Group Note  Date:  08/07/2012 Time:  8:00PM Group Topic/Focus:  Wrap-Up Group:   The focus of this group is to help patients review their daily goal of treatment and discuss progress on daily workbooks.  Participation Level:  Active  Participation Quality:  Appropriate and Attentive  Affect:  Appropriate  Cognitive:  Alert and Appropriate  Insight: Appropriate  Engagement in Group:  Engaged  Modes of Intervention:  Discussion  Additional Comments:  Pt. Was attentive and appropriate during tonight group. Pt stated she had a good day. Pt enjoyed visit from son. Pt stated that she was able to get some rest today. Pt is enjoying her stay.   Bing Plume D 08/07/2012, 11:18 PM

## 2012-08-07 NOTE — Progress Notes (Addendum)
Pt expressed concerns about "the rattlesnake in her house." She stated that her fear is it might not have the proper food to survive while she is here. Pt stated there is old pizza crust and Dorittos and maybe the snake would like that for food.Pt stated she set traps up in the house and saw the snake go in but when the police showed up the snake was gone. Pt showed nurse the snakes bites on her arm and none were seen. Pt does have a large boil like area on the back of her head. PA aware there is no dranaige from the boil area but there did appear to be dried blood on the surface. Pt denies any pain. Pt is obssessed with this snake in her house and that is all she talks about with the nurse. Pt does have  small reddened bumps on her chest but denies any itching. Pt is pleasant and cooperative and contracts for safety. No SI or HI.PA, Lloyd Huger , looked at pts head and will call in a consult.It does appear that boil like area on the parital part of head needs to be drained again. It is about the size of a golfball. Pts. Son did come to visit there pt. He was introduced to the pts counselor and was in their treatment team. Pt is visiting with her son,

## 2012-08-07 NOTE — Progress Notes (Signed)
Patient ID: Emily Duarte, female   DOB: [birth year], 62 y.o.   MRN: [id number]  Admission Note:  D:61 yr female who presents IVC in no acute distress for medication management and the treatment of Bi-polar, manic with psychotic features . Pt appears animated, and pre-occupied. Pt was manic, with flight of ideas, but was cooperative with admission process. Pt denies SI/HI/AVH and  Pain.Pt has Past medical Hx of  tubal Ligation per the pt, but may be essure procedure per pt, she did not know the name of the procedure, but explained what was done, pt states she does not have menstrual cycles anymore. Pt says she has snakes in her home and the only place they can't get to is in her bathroom, and the only way to clean the bite marks is with the Clorox and tide. Pt belongings were put in locker 24 (wig,purse, wallet, dress, and jewelry). Pt presented with loose associations, OCD behavior, and pressured speech.   A:Skin was assessed and found to be clear of any abnormal marks apart from a wound on top of head, and psoriasis on chest area. POC and unit policies explained and understanding verbalized. Consents obtained. Food and fluids offered, and  denied. 15 minutes checks for safety started. Pt was introduced to milieu by nursing staff.  Clinical research associate offered suppor   R: Pt had no additional questions or concerns. Pt went to room and fell asleep.  Pt was receptive to education about the milieu

## 2012-08-07 NOTE — Tx Team (Signed)
Initial Interdisciplinary Treatment Plan  PATIENT STRENGTHS: (choose at least two) Capable of independent living General fund of knowledge  PATIENT STRESSORS: Financial difficulties Medication change or noncompliance   PROBLEM LIST: Problem List/Patient Goals Date to be addressed Date deferred Reason deferred Estimated date of resolution  Bi-Polar-Manic 08/07/12     Risk For Suicide 08/07/12     Endangerment to self 08/07/12                                          DISCHARGE CRITERIA:  Improved stabilization in mood, thinking, and/or behavior Medical problems require only outpatient monitoring  PRELIMINARY DISCHARGE PLAN: Attend aftercare/continuing care group Outpatient therapy  PATIENT/FAMIILY INVOLVEMENT: This treatment plan has been presented to and reviewed with the patient, Emily Duarte.  The patient and family have been given the opportunity to ask questions and make suggestions.  Jacques Navy A 08/07/2012, 1:43 AM

## 2012-08-07 NOTE — Tx Team (Signed)
Interdisciplinary Treatment Plan Update   Date Reviewed:  08/07/2012  Time Reviewed:  11:53 AM  Progress in Treatment:   Attending groups: Yes Participating in groups: Yes Taking medication as prescribed: Yes  Tolerating medication: Yes Family/Significant other contact made: No Patient understands diagnosis: Yes As evidenced by asking for help with "anxiety" Discussing patient identified problems/goals with staff: Yes  See initial plan Medical problems stabilized or resolved: Yes Denies suicidal/homicidal ideation: Yes  In tx team Patient has not harmed self or others: Yes  For review of initial/current patient goals, please see plan of care.  Estimated Length of Stay:  4-5 days  Reason for Continuation of Hospitalization: Anxiety Delusions  Medication stabilization  New Problems/Goals identified:  N/A  Discharge Plan or Barriers:   return home, follow up outpt  Additional Comments:   Emily Duarte is an 62 y.o. female with a hx of Bipolar Disorder, who presents to the ED complaining of bites from a snake with onset 10 months ago (June, 2013 per pt). The pt reports she was bitten by an animal on her neck (a rattle snake) and face in June, 2013, which has caused her a severe amount of distress. The pt's distress has become progressively worse, causing her to lock herself within her bathroom, where she informs she feels safe. Reportedly, animal control and police have responded to the claim, however, has yet to locate any such animal which may be responsible for the incident. The pt stated she has several wounds that have been bleeding that she has treated with Tide and bleach. Pt smells of body odor and bleach. Pt appears disheveled and has rapid, pressured, and tangential speech with flight of ideas. Pt appears to have somatic, persecutory, and grandiose delusions, believing she has "special art" in her room that cannot be moved and stating such things as "I have industry  needs." Pt reports she has been drinking "1 cap full" of Wild [place] Rose to help "alter my mental status" so she can tolerate the snake that keeps getting into her home to bite her. Pt believes she was "designed" into her apartment, but she must now "abandon" because the snake is too overwhelming for her. Pt stated he bedspread heals her and that she has to choose colors of "grapevine" and she has no choice in the colors that are in her bedspread. Pt stated she is surrounded by "milions of dollars of atmosphere" and "I appreciate commerce." Pt stated she feels comfort locked in her bathroom. Pt admits to paranoia. Pt stated she sees shadows that "are not what they appear." Per her son, she has taken no medications since being at [facility] in 2005 and he brought her out of concern. Pt was prescribed Risperdal and Depakote, stating the medicines worked "well" but that she felt too sedated. Pt was previously at [facility] 1999 for psychosis as well. The pt also complains of gradual, progressively worsening depression. Pt stated she has been eating and sleeping well, stating "I crash great." Pt denies SI or HI. The pt lives at home alone, but her son is present and supportive. Pt knows she needs inpatient treatment and is in agreement with being admitted to an inpatient psychiatric facility   Attendees:  Signature: Thedore Mins, MD 08/07/2012 11:53 AM   Signature: Richelle Ito, LCSW 08/07/2012 11:53 AM  Signature: Verne Spurr, PA 08/07/2012 11:53 AM  Signature: Rodman Key, RN  08/07/2012 11:53 AM  Signature:  08/07/2012 11:53 AM  Signature:  08/07/2012 11:53  AM  Signature:   08/07/2012 11:53 AM  Signature:    Signature:    Signature:    Signature:    Signature:    Signature:      Scribe for Treatment Team:   Richelle Ito, LCSW  08/07/2012 11:53 AM

## 2012-08-07 NOTE — [facility] Suicide Risk Assessment (Signed)
Suicide Risk Assessment  Admission Assessment     Nursing information obtained from:  Patient Demographic factors:  Divorced or widowed;Caucasian;Low socioeconomic status;Living alone;Unemployed Current Mental Status:  NA Loss Factors:  Financial problems / change in socioeconomic status Historical Factors:  NA Risk Reduction Factors:  Sense of responsibility to family;Positive social support  CLINICAL FACTORS:   Previous Psychiatric Diagnoses and Treatments  COGNITIVE FEATURES THAT CONTRIBUTE TO RISK:  Closed-mindedness    SUICIDE RISK:   Minimal: No identifiable suicidal ideation.  Patients presenting with no risk factors but with morbid ruminations; may be classified as minimal risk based on the severity of the depressive symptoms  PLAN OF CARE:1. Admit for crisis management and stabilization. 2. Medication management to reduce current symptoms to base line and improve the  patient's overall level of functioning 3. Treat health problems as indicated. 4. Develop treatment plan to decrease risk of relapse upon discharge and the need for readmission. 5. Psycho-social education regarding relapse prevention and self care. 6. Health care follow up as needed for medical problems. 7. Restart home medications where appropriate.   I certify that inpatient services furnished can reasonably be expected to improve the patient's condition.  Emily Stallings,MD 08/07/2012, 10:26 AM

## 2012-08-07 NOTE — Clinical Social Work Note (Signed)
[facility] LCSW Group Therapy  08/07/2012 2:24 PM   Type of Therapy:  Group Therapy  Participation Level:  Active  Participation Quality:  Attentive  Affect:  Appropriate  Cognitive:  Appropriate  Insight:  Improving  Engagement in Therapy:  Engaged  Modes of Intervention:  Clarification, Education, Exploration and Socialization  Summary of Progress/Problems: Today's group focused on relapse prevention.  We defined the term, and then brainstormed on ways to prevent relapse.  Emily Duarte identified protective factors of her daughter in law, listening to music of the 80's and 90's, and cleaning her home.  She is fairly disorganized and with circumstantial thought.  Daryel Gerald B 08/07/2012 , 2:24 PM

## 2012-08-07 NOTE — Progress Notes (Signed)
Recreation Therapy Notes  Date: 04.18.2014 Time: 9:30am Location: [address] Day Room      Group Topic/Focus: Memory, Cognitive Training  Participation Level: Active  Participation Quality: Appropriate  Affect: Flat  Cognitive: Oriented   Additional Comments: Activity: Word Link & Memory Sequence Explanation: Word Link - Patients were asked to link words together (i.e. Red Tomato Soup). Patients were asked to link together as many words as possible. Memory Sequence - Patients were asked to create a sequence of actions for each member of the group to complete. The entire group had to complete the sequence before a new action as added on. Each patient got a turn to add to the sequence of actions. Patients participated from a seated position.   Patient actively participated in both activities. 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 Group was unable to process the memory sequence game so it was stopped after the 4th patient. Patient needed assistance completing the four action sequence created. Approximately 2 minutes after activity had been stopped, patient asked for clarification on sequence. LRT informed patient that the group had moved on to a discussion about the importance of routine and exercising our brains. Patient accepted LRT answer and listened to the group conversation.   Marykay Lex Kahealani Yankovich, LRT/CTRS  Kaytie Ratcliffe L 08/07/2012 11:35 AM
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
## 2012-08-07 NOTE — Progress Notes (Signed)
Patient ID: Emily Duarte, female   DOB: [birth year], 62 y.o.   MRN: [id number] D. The patient is pleasant and interacting in the milieu. Attended and actively participated in evening group. The topic of discussion was relapse prevention. She had a hard time relating to the topic and could not identify a coping skill or plan. However, agreed when others mentioned the importance of sleep. A. Met with patient 1:1. Reviewed and administered HS medications. Reviewed fall safety plan. R. The patient denied any a/v hallucinations. Stated she had a good day and that her son visited and brought her clean clothes from home.

## 2012-08-08 DIAGNOSIS — F3164 Bipolar disorder, current episode mixed, severe, with psychotic features: Principal | ICD-10-CM

## 2012-08-08 LAB — TSH: TSH: 1.699 u[IU]/mL (ref 0.350–4.500)

## 2012-08-08 NOTE — [facility] Group Notes (Signed)
[facility] Group Notes:  (Nursing/MHT/Case Management/Adjunct)  Date:  08/08/2012  Time:  0930  Type of Therapy:  Psychoeducational Skills  Participation Level:  Active  Participation Quality:  Appropriate and Sharing  Affect:  Appropriate  Cognitive:  Alert  Insight:  Appropriate  Engagement in Group:  Engaged and Supportive  Modes of Intervention:  Activity, Clarification, Discussion, Education, Problem-solving and Support  Summary of Progress/Problems:  Emily Duarte 08/08/2012, 10:38 AM

## 2012-08-08 NOTE — Progress Notes (Signed)
D: Visible in milieu but minimally interactive. Appears bright and is calm and cooperative. Offers no questions or concerns. Denies pain/discomfort. Feels like she is doing well. On a scale of 1-10 (1 being the best and 10 being the worst) she rated her depression and hopelessness a 2. When asked about seeing the rattlesnake on the unit, she replied she had not and feels safe here, but did express that she had had a bite wound recently lanced and it was doing well. Denies SI/HI/AVH and contracts for safety.   A: Safety has been maintained with q15 minute observation. Support and encouragement provided. Administered meds as ordered by MD. Encouraged participation.   R: Remains safe. Calm and pleasant. Compliant with meds and treatment goals. Offers no questions or concerns. Will continue current POC and q15 min obs.

## 2012-08-08 NOTE — Progress Notes (Signed)
Patient ID: Emily Duarte, female   DOB: 1950/11/25, 62 y.o.   MRN: [id number]    S:  Seen today. Reports fair sleep but thinks she is feeling depressed. Continues to be focussed on snake and disorganized at this time. Not sure why she is here and only talks about snake when asked today.     General Appearance: Disheveled   Eye Contact:: Good   Speech: Clear and Coherent   Volume: Normal   Mood: Euthymic   Affect: Congruent   Thought Process: Disorganized   Orientation: Full (Time, Place, and Person)   Thought Content: Delusions and Hallucinations: Auditory  Tactile  Visual   Suicidal Thoughts: No   Homicidal Thoughts: No   Memory: Immediate; Poor  Recent; Poor  Remote; Poor   Judgement: Impaired   Insight: Lacking   Psychomotor Activity: Normal   Concentration: Fair   Recall: Poor   Akathisia: No     Dx:  AXIS I: Bipolar disorder, most recent episode mixed, severe with psychotic features  AXIS II: Deferred  AXIS III: History reviewed. No pertinent past medical history.  AXIS IV: other psychosocial or environmental problems and problems with access to health care services  AXIS V: 31-40 impairment in reality testing    Treatment Plan/Recommendations:   Continue current meds

## 2012-08-08 NOTE — [facility] Group Notes (Signed)
[facility] LCSW Group Therapy  08/08/2012 11:15-11:55AM   Summary of Progress/Problems: The main focus of today's process group was for the patient to identify ways in which they have in the past sabotaged their own recovery and reasons they may have done this. We then worked to identify a specific plan to avoid this in the future when discharged from the hospital. There was a lively discussion about how to stay on medications using a pillbox or other reminders, as well as how to engage one's doctor as an important part of one's wellness plan. The patient expressed a number of disjointed ideas, and although she spoke fluently, some of the words were completely incongruent.  She stated she had learned in the earlier group today to identify negative attitudes that she herself engages in, and in doing this she realized she loves her neighbor.  She has never met her neighbor, and that person has moved out, but she wants to give this person/family $400 to help them because maybe that is why they moved.  She enjoyed hearing the busyness of the household next door and misses them, loves them.  Type of Therapy: Group Therapy  Participation Level:  Active  Participation Quality:  Attentive and Sharing  Affect:  Blunted  Cognitive:  Disorganized  Insight:  Off Topic  Engagement in Therapy:  Off Topic  Modes of Intervention:  Education and Exploration  Sarina Ser 08/08/2012, 5:00 PM

## 2012-08-09 NOTE — Progress Notes (Signed)
Adult Psychoeducational Group Note  Date:  08/09/2012 Time:  4:04 AM  Group Topic/Focus:  Wrap-Up Group:   The focus of this group is to help patients review their daily goal of treatment and discuss progress on daily workbooks.  Participation Level:  Active  Participation Quality:  Monopolizing  Affect:  Labile  Cognitive:  Lacking  Insight: Limited  Engagement in Group:  Monopolizing  Modes of Intervention:  Support  Additional Comments:  Patient attended and participated in group tonight. She reports having a good day. She advised that she believe she was improving. Today her son visited with her and she wanted to show him that she was better, that she was a stronger person that the person he brought into the hospital for help. The patient advised that she identified with the negative aspect of today's group "healthy coping skills".   Lita Mains [facility] 08/09/2012, 4:04 AM

## 2012-08-09 NOTE — [facility] Group Notes (Signed)
[facility] LCSW Group Therapy  08/09/2012 11:15AM-12:00PM  Summary of Progress/Problems:  The main focus of today's process group was to listen to a variety of genres of music and to identify that different types of music evoke different responses.  The patient then was able to identify personally what was soothing for them, as well as energizing.  Handouts were used to record feelings evoked, as well as how patient can personally use this knowledge in sleep habits, with depression, and with other symptoms.  The patient expressed fluently what emotions resulted from the various types of music played.  She stayed alert and engaged throughout group, although she had to consult her couch mate to discuss what he had written on the handout before she would answer CSW as to what she had written on hers.  Type of Therapy:  Music Therapy  Participation Level:  Active  Participation Quality:  Attentive  Affect:  Blunted  Cognitive:  Appropriate  Insight:  Developing/Improving  Engagement in Therapy:  Developing/Improving  Modes of Intervention:  Activity  Sarina Ser 08/09/2012, 12:42 PM

## 2012-08-09 NOTE — Progress Notes (Signed)
Patient ID: Emily Duarte, female   DOB: Sep 06, 1950, 62 y.o.   MRN: [id number] D)  Was in her room, awake but lying down, and agreed to come to group.  Started talking about how much she had gotten out of group earlier today, and how things were starting to make sense.  States she lives alone and having someone and something to think about has helped her.  Feels she has needed interaction, and that living alone, she misses that.  Also had a visit from her son, and she said he noticed how much of an improvement she has made.  She tried to explain about the coping skills they had discussed during the day, and felt the negative ones had applied to her but was trying to change them to be positive.  Overall, rated her day as excellent.  Came to med window afterward for hs meds, affect bright, was smiling, appreciative. A)  Will continue to monitor for safety, continue POC. R)  Safety maintained.

## 2012-08-09 NOTE — Progress Notes (Signed)
Patient ID: Emily Duarte, female   DOB: 10/24/1950, 62 y.o.   MRN: [id number] She has been up and down today interacting with peers and staff. She denies V/H .Says that her son is coming today and that they were going to talk.  Says that she is more like herself but does continue to have confused thoughts at times.

## 2012-08-09 NOTE — [facility] Group Notes (Signed)
[facility] Group Notes:  (Nursing/MHT/Case Management/Adjunct)  Date:  08/09/2012  Time:  10:11 PM  Date:  08/09/2012  Time:  8:48 PM  Type of Therapy:  Group Therapy  Participation Level:  Active  Participation Quality:  Appropriate  Affect:  Appropriate  Cognitive:  Appropriate  Insight:  Appropriate  Engagement in Group:  Engaged  Modes of Intervention:  Discussion and Education  Summary of Progress/Problems:  Pt's states that her goal for the day was to wake up today with the same great feeling that she had yesterday.  Pt states that she did not fully meet her goal, but will attempt to do so again tomorrow.

## 2012-08-09 NOTE — Progress Notes (Signed)
Patient ID: [name], female   DOB: 02-11-1951, 62 y.o.   MRN: [id number]   S:  Seen today. Reports fair sleep.  Reports better mood today. Less focussed on snake but still disorganized at this time.  Attending groups at this itme.     General Appearance: Disheveled   Eye Contact:: Good   Speech: Clear and Coherent   Volume: Normal   Mood: Euthymic   Affect: Congruent   Thought Process: Disorganized   Orientation: Full (Time, Place, and Person)   Thought Content: Delusions of sanke?   Suicidal Thoughts: No   Homicidal Thoughts: No   Memory: Immediate; Poor  Recent; Poor  Remote; Poor   Judgement: Impaired   Insight: Lacking   Psychomotor Activity: Normal   Concentration: Fair   Recall: Poor   Akathisia: No     Dx:  AXIS I: Bipolar disorder, most recent episode mixed, severe with psychotic features  AXIS II: Deferred  AXIS III: History reviewed. No pertinent past medical history.  AXIS IV: other psychosocial or environmental problems and problems with access to health care services  AXIS V: 31-40 impairment in reality testing    Treatment Plan/Recommendations:   Continue current meds

## 2012-08-10 DIAGNOSIS — F3163 Bipolar disorder, current episode mixed, severe, without psychotic features: Secondary | ICD-10-CM

## 2012-08-10 MED ORDER — RISPERIDONE 0.5 MG PO TABS
1.5000 mg | ORAL_TABLET | Freq: Every day | ORAL | Status: DC
  Administered 2012-08-10 – 2012-08-13 (×4): 1.5 mg via ORAL
  Filled 2012-08-10 (×2): qty 3
  Filled 2012-08-10: qty 9
  Filled 2012-08-10 (×3): qty 3
  Filled 2012-08-10: qty 9

## 2012-08-10 NOTE — Progress Notes (Signed)
Patient ID: Emily Duarte, female   DOB: 1950/11/17, 62 y.o.   MRN: [id number] D)  Was in her room most of the evening, but did come down to the dayroom for group.  She had gotten a shower, was afraid to wash the area around the cyst on the back of her head.  The cyst appears soft, and no drainage noted at this time.  Denies pain or discomfort.  States being here has been good for her, has been able to get away from her house and has been able to have people to talk to.  Seemed happy that she would be going to the ED in the am to have someone follow up on the cyst. A)  Will continue to monitor for safety, continue POC R)   Safety maintained.

## 2012-08-10 NOTE — Progress Notes (Signed)
Recreation Therapy Notes  Date: 04.21.2014  Time: 9:30am  Location: [address] Day Room   Group Topic/Focus: Self Expression   Participation Level:  Active   Participation Quality:  Appropriate   Affect:  Euthymic   Cognitive:  Appropriate   Additional Comments: Activity: Emotion Airplane; Explanation: Patients were given a piece of plain white paper and asked to make a paper airplane out of it. On the paper airplane patients were asked to list the following things on the outside flaps: something that you fear, something that angers you. Patients were asked to list the following things on the inside flaps: Something you want to accomplish, a goal for your hospital stay. Patients were then given time to decorate their paper airplane anyway they choose. Folk indie rock music was played in the background to enhance the therapeutic environment.   Patient made and decorated her airplane. Patient listed animals as fears of hers. Patient explained this to be specifically a fear of snakes because she had recently been bitten by a snake. Patient lifter her shirt sleeve to show LRT where snake had bitten her. Patient pointed to small red spot on her anterior forearm. Patient additionally stated this snake had bitten her in her neck and it had not healed properly because she did not "stop talking for two weeks." Patient described the snake as a small brown snake, coiled into a ball. Patient used her hands as a size reference showing LRT that the snake could fit in the "C" shape made by curling her fingers. Patient stated this snake moves around her home. Patient listed losing a receipt for a "beautiful thing" as something the angers her.  Patient listed enjoy a cup of coffee as a goal for her hospital stay. Patient was not able to identify something she would like to accomplish.    Marykay Lex Markayla Reichart, LRT/CTRS   Jearl Klinefelter 08/10/2012 12:07 PM

## 2012-08-10 NOTE — Progress Notes (Signed)
D: Patient in her room on approach.  Patient states everything is ok.  Patient states she got a chance to talk in group today.  Patient states her head does not hurt.  Patient cyst looked at by Clinical research associate.  Patient states she is learning about wellness and how to to care for herself when she gets home. Patient denies SI/HI and denies AVH. A: Staff to monitor Q 15 mins for safety.  Encouragement and support offered.  Scheduled medications administered per orders.  Packing still intact in back of head.  R: Patient remains safe on the unit.  Patient attended group tonight.  Patient cooperative and taking administered. medications.  Patient isolative in her room patient did come our of her room for group.

## 2012-08-10 NOTE — Clinical Social Work Note (Signed)
  Type of Therapy: Process Group Therapy  Participation Level:  Minimal  Participation Quality:  Attentive  Affect:  Flat  Cognitive:  Confused  Insight:  Limited  Engagement in Group:  Limited  Engagement in Therapy:  Limited  Modes of Intervention:  Activity, Clarification, Education, Problem-solving and Support  Summary of Progress/Problems: Today's group addressed the issue of overcoming obstacles.  Patients were asked to identify their biggest obstacle post d/c that stands in the way of their on-going success, and then problem solve as to how to manage this. [name] wondered about her meds and how to "incorporate them into a home environment."  She elaborated that here everything is "antiseptic" and she is reminded from nurses when it is medication time.  She accepted feedback from others that a pill box may help her organize herself at home.       Ida Rogue 08/10/2012   2:32 PM

## 2012-08-10 NOTE — Progress Notes (Signed)
D:  Pt did not fill out the self inventory sheet, denies SI/HI/AVH, preoccupied with "snake bites" pt seems to think that the cyst on her head was a result of being bit by a snake in her apartment, she states that she has been bit by it before "but it was too soon to be bit by it again", pt is disheveled, poor hygiene, encouraged pt to take a shower and cleanse the draining cyst on her head thoroughly, pt is calm and cooperative, denies pain at this time.  A:  Emotional support provided, Encouraged pt to continue with treatment plan and attend all group activities, q15 min checks maintained for safety.  R:  Pt is receptive, attends group, cooperative with staff and peers.

## 2012-08-10 NOTE — Consult Note (Signed)
WOC consult Note Reason for Consult: evaluate wound on the top of the patient head. She did have I & D in the ER 08/06/12. No dressing changes or care had been ordered from the ER. Had pt shower and wash her hair just prior to my evaluation. Wound type: appears to be sebaceous cyst, with reaccumulation of the drainage at the site.  May eventually need wide excision if packing does not resolve the area.  Measurement: 2cm x 2cm raised cyst like area with incision site that is 1cm deep Wound [id number] to visualize Drainage (amount, consistency, odor) blood with some particles of cyst like material within the drainage.  Periwound:some maceration of the surrounding tissue, some crusting of the surrounding hair  Dressing procedure/placement/frequency: iodoform packing 1/4", cut length approx. 6 cm in length and then packed the pocket of the wound and allowed for wick to hand out of the wound opening to allow for drainage.  Unfortunately with her hair it will be very difficult to apply any type of topper dressing. It will be very important to keep this area pack to allow for wicking of the drainage. Would suggest daily shampoo of the hair with repacking of the wound after the head and hair has been cleansed.  Iodoform packing strip labeled with patients name and given to the bedside nurse, along with sterile applicators and scissors.   Re consult if needed, will not follow at this time. Thanks  [employer], CWOCN [phone])

## 2012-08-10 NOTE — Care Management Utilization Note (Signed)
PER STATE REGULATIONS 482.30  THIS CHART WAS REVIEWED FOR MEDICAL NECESSITY WITH RESPECT TO THE PATIENT'S ADMISSION/ DURATION OF STAY.  NEXT REVIEW DATE: 08/13/12

## 2012-08-10 NOTE — Progress Notes (Signed)
The focus of this group is to help patients review their daily goal of treatment and discuss progress on daily workbooks. Pt attended the evening group session and responded to all discussion prompts from the Writer. Pt said that she learned a valuable skill today in that she was going to organize her medicine upon discharge home into a box with labels for each day of the week. Pt stated that she felt like this and other measures like this would help ensure she had success with her continued treatment. Pt smiled throughout group and appeared engaged in the discussion. Pt also offered encouraging comments to her peers.

## 2012-08-10 NOTE — Progress Notes (Signed)
[facility] MD Progress Note  08/10/2012 12:30 PM Emily Duarte  MRN:  [id number] Subjective:  "I"m not as chipper today as I was yesterday." "I'm doing a little better today after a talk with my son."  Objective: Patient remains disorganized but is clearing, less focused on "snakes or snakebites." today.  Wound care nurse in to pack open cyst. Diagnosis:  Bipolar 1 disorder, mixed, severe   ADL's:  Impaired  Sleep: Good  Appetite:  Good  Suicidal Ideation:  denies Homicidal Ideation:  denies AEB (as evidenced by): patient's report of improving sleep, resolving symptoms.  Psychiatric Specialty Exam: Review of Systems  Constitutional: Negative.  Negative for fever, chills, weight loss, malaise/fatigue and diaphoresis.  HENT: Negative for congestion and sore throat.   Eyes: Negative for blurred vision, double vision and photophobia.  Respiratory: Negative for cough, shortness of breath and wheezing.   Cardiovascular: Negative for chest pain, palpitations and PND.  Gastrointestinal: Negative for heartburn, nausea, vomiting, abdominal pain, diarrhea and constipation.  Musculoskeletal: Negative for myalgias, joint pain and falls.  Neurological: Negative for dizziness, tingling, tremors, sensory change, speech change, focal weakness, seizures, loss of consciousness, weakness and headaches.  Endo/Heme/Allergies: Negative for polydipsia. Does not bruise/bleed easily.  Psychiatric/Behavioral: Negative for depression, suicidal ideas, hallucinations, memory loss and substance abuse. The patient is not nervous/anxious and does not have insomnia.     Blood pressure 140/92, pulse 87, temperature 98.3 F (36.8 C), temperature source Oral, resp. rate 18, height 5\' 4"  (1.626 m), weight 102.059 kg (225 lb).Body mass index is 38.6 kg/(m^2).  General Appearance: Disheveled  Eye Contact::  Good  Speech:  Clear and Coherent  Volume:  Normal  Mood:  Euthymic  Affect:  Congruent  Thought Process:   Disorganized  Orientation:  Full (Time, Place, and Person)  Thought Content:  Delusions and Hallucinations: Auditory Visual  Suicidal Thoughts:  No  Homicidal Thoughts:  No  Memory:  Immediate;   Fair  Judgement:  Impaired  Insight:  Present  Psychomotor Activity:  Normal  Concentration:  Fair  Recall:  Fair  Akathisia:  No  Handed:  Right  AIMS (if indicated):     Assets:  Communication Skills Desire for Improvement Housing Physical Health Social Support  Sleep:  Number of Hours: 6.5   Current Medications: Current Facility-Administered Medications  Medication Dose Route Frequency Provider Last Rate Last Dose  . acetaminophen (TYLENOL) tablet 650 mg  650 mg Oral Q6H PRN Sanjuana Kava, NP      . alum & mag hydroxide-simeth (MAALOX/MYLANTA) 200-200-20 MG/5ML suspension 30 mL  30 mL Oral Q4H PRN Sanjuana Kava, NP      . divalproex (DEPAKOTE) DR tablet 250 mg  250 mg Oral BID Sanjuana Kava, NP   250 mg at 08/10/12 4098  . hydrOXYzine (ATARAX/VISTARIL) tablet 25 mg  25 mg Oral Q4H PRN Sanjuana Kava, NP      . magnesium hydroxide (MILK OF MAGNESIA) suspension 30 mL  30 mL Oral Daily PRN Sanjuana Kava, NP      . risperiDONE (RISPERDAL) tablet 1 mg  1 mg Oral QHS Sanjuana Kava, NP   1 mg at 08/09/12 2136    Lab Results:  Results for orders placed during the hospital encounter of 08/06/12 (from the past 48 hour(s))  VALPROIC ACID LEVEL     Status: None   Collection Time    08/09/12  6:57 AM      Result Value Range   Valproic  Acid Lvl 53.9  50.0 - 100.0 ug/mL    Physical Findings: AIMS: Facial and Oral Movements Muscles of Facial Expression: None, normal Lips and Perioral Area: None, normal Jaw: None, normal Tongue: None, normal,Extremity Movements Upper (arms, wrists, hands, fingers): None, normal Lower (legs, knees, ankles, toes): None, normal, Trunk Movements Neck, shoulders, hips: None, normal, Overall Severity Severity of abnormal movements (highest score from questions  above): None, normal Incapacitation due to abnormal movements: None, normal Patient's awareness of abnormal movements (rate only patient's report): No Awareness, Dental Status Current problems with teeth and/or dentures?: No Does patient usually wear dentures?: No  CIWA:  CIWA-Ar Total: 0 COWS:  COWS Total Score: 1  Treatment Plan Summary: Daily contact with patient to assess and evaluate symptoms and progress in treatment Medication management  Plan: 1. Will continue the current plan of care with the following changes as noted. Will increase Risperdal to     1.5mg  po qd at hs. 2. Will leave depakote at the current dose as the patient is at a therapeutic dose. 3. Will have dressing changes as noted by [facility] nurse.  Medical Decision Making Problem Points:  Established problem, stable/improving (1) and Review of last therapy session (1) Data Points:  Review or order medicine tests (1)  I certify that inpatient services furnished can reasonably be expected to improve the patient's condition.  Rona Ravens. Ishita Mcnerney RPAC 12:37 PM 08/10/2012

## 2012-08-11 NOTE — Progress Notes (Signed)
Patient ID: Emily Duarte, female   DOB: 1950-10-13, 63 y.o.   MRN: [id number] Patient presents with bright affect.  She reports she is sleeping and eating well.  She rates her depression as a 2.  Patient remains delusional; she believes that a snake bit her on the head.  She believes the snake may have been a pet of one of her neighbors and got into her house.  The cyst on her head was cleansed and packed.  The wound remains with drainage and is shrinking in size.  Patient tolerated the dressing well.  She denies any SI/HI/AVH.  Patient is attending groups and participating.  Continue to monitor medication management and MD orders.  Safety checks completed every 15 minutes per protocol.  Patient's behavior has been appropriate.

## 2012-08-11 NOTE — [facility] Group Notes (Signed)
[facility] LCSW Aftercare Discharge Planning Group Note   08/11/2012 10:03 AM  Participation Quality:  Engaged  Mood/Affect:  Appropriate  Depression Rating:  unknown  Anxiety Rating:  unknown  Thoughts of Suicide:  No Will you contract for safety?   NA  Current AVH:  Continues with confusion, disorganization  Plan for Discharge/Comments:  [name] had no new complaints today.  She feels she is slowly getting better.  Offered advice to others that was no well received.  Transportation Means: son  Supports: son   Emily Duarte

## 2012-08-11 NOTE — Progress Notes (Addendum)
Patient ID: Emily Duarte, female   DOB: 1950/10/13, 62 y.o.   MRN: [id number] [facility] MD Progress Note  08/11/2012 11:33 AM Emily Duarte  MRN:  [id number] Subjective:  "I'm doing fine."    Objective: Patient is up and dressed attending groups, going to meals. She is alert, focused, and active in the unit milieu. She is agreeable to going to an ALF upon discharge and tells me her son is working on this. She reports good sleep, no new symptoms, and states she is better each day.  Diagnosis:  Bipolar 1 disorder, mixed, severe   ADL's:  Impaired  Sleep: Good  Appetite:  Good  Suicidal Ideation:  denies Homicidal Ideation:  denies AEB (as evidenced by): patient's report of improving sleep, resolving symptoms.  Psychiatric Specialty Exam: Review of Systems  Constitutional: Negative.  Negative for fever, chills, weight loss, malaise/fatigue and diaphoresis.  HENT: Negative for congestion and sore throat.   Eyes: Negative for blurred vision, double vision and photophobia.  Respiratory: Negative for cough, shortness of breath and wheezing.   Cardiovascular: Negative for chest pain, palpitations and PND.  Gastrointestinal: Negative for heartburn, nausea, vomiting, abdominal pain, diarrhea and constipation.  Musculoskeletal: Negative for myalgias, joint pain and falls.  Neurological: Negative for dizziness, tingling, tremors, sensory change, speech change, focal weakness, seizures, loss of consciousness, weakness and headaches.  Endo/Heme/Allergies: Negative for polydipsia. Does not bruise/bleed easily.  Psychiatric/Behavioral: Negative for depression, suicidal ideas, hallucinations, memory loss and substance abuse. The patient is not nervous/anxious and does not have insomnia.     Blood pressure 137/96, pulse 89, temperature 97.6 F (36.4 C), temperature source Oral, resp. rate 17, height 5\' 4"  (1.626 m), weight 102.059 kg (225 lb).Body mass index is 38.6 kg/(m^2).  General  Appearance: fairly groomed  Patent attorney::  Good  Speech:  Clear and Coherent  Volume:  Normal  Mood:  Euthymic  Affect:  Congruent  Thought Process:  Clearing and linear  Orientation:  Full (Time, Place, and Person)  Thought Content:  Denies AVH, no longer focused on snakes or small animals.  Suicidal Thoughts:  No  Homicidal Thoughts:  No  Memory:  Immediate;   Fair  Judgement:  fair  Insight:  Present  Psychomotor Activity:  Normal  Concentration:  Fair  Recall:  Fair  Akathisia:  No  Handed:  Right  AIMS (if indicated):     Assets:  Communication Skills Desire for Improvement Housing Physical Health Social Support  Sleep:  Number of Hours: 6.25   Current Medications: Current Facility-Administered Medications  Medication Dose Route Frequency Provider Last Rate Last Dose  . acetaminophen (TYLENOL) tablet 650 mg  650 mg Oral Q6H PRN Sanjuana Kava, NP      . alum & mag hydroxide-simeth (MAALOX/MYLANTA) 200-200-20 MG/5ML suspension 30 mL  30 mL Oral Q4H PRN Sanjuana Kava, NP      . divalproex (DEPAKOTE) DR tablet 250 mg  250 mg Oral BID Sanjuana Kava, NP   250 mg at 08/11/12 0748  . hydrOXYzine (ATARAX/VISTARIL) tablet 25 mg  25 mg Oral Q4H PRN Sanjuana Kava, NP      . magnesium hydroxide (MILK OF MAGNESIA) suspension 30 mL  30 mL Oral Daily PRN Sanjuana Kava, NP      . risperiDONE (RISPERDAL) tablet 1.5 mg  1.5 mg Oral QHS Verne Spurr, PA-C   1.5 mg at 08/10/12 2128    Lab Results:  No results found for this or any previous  visit (from the past 48 hour(s)).  Physical Findings: Cyst is packed with visible tail, appears smaller, no erythema. AIMS: Facial and Oral Movements Muscles of Facial Expression: None, normal Lips and Perioral Area: None, normal Jaw: None, normal Tongue: None, normal,Extremity Movements Upper (arms, wrists, hands, fingers): None, normal Lower (legs, knees, ankles, toes): None, normal, Trunk Movements Neck, shoulders, hips: None, normal, Overall  Severity Severity of abnormal movements (highest score from questions above): None, normal Incapacitation due to abnormal movements: None, normal Patient's awareness of abnormal movements (rate only patient's report): No Awareness, Dental Status Current problems with teeth and/or dentures?: No Does patient usually wear dentures?: No  CIWA:  CIWA-Ar Total: 0 COWS:  COWS Total Score: 1  Treatment Plan Summary: Daily contact with patient to assess and evaluate symptoms and progress in treatment Medication management  Plan: 1. Will continue the current plan of care.  2. Anticipate d/c Wednesday or Thursday if continues to improve. 3. Will have dressing changes as noted by [facility] nurse.  Medical Decision Making Problem Points:  Established problem, stable/improving (1) and Review of last therapy session (1) Data Points:  Review or order medicine tests (1)  I certify that inpatient services furnished can reasonably be expected to improve the patient's condition.  Rona Ravens. Emily Duarte RPAC 11:33 AM 08/11/2012

## 2012-08-11 NOTE — Progress Notes (Signed)
The focus of this group is to help patients review their daily goal of treatment and discuss progress on daily workbooks. Pt attended the evening group session and responded to all prompts from the Writer, albeit with off-topic answers. Pt presented with disorganized thought process and discussed in group wanting to own a watch because her arm was sparkling, wanting to move to a new home and a previous career at a phone company. 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 Pt was pleasant and smiled throughout group.

## 2012-08-11 NOTE — Clinical Social Work Note (Signed)
[facility] LCSW Group Therapy  08/11/2012 , 2:20 PM   Type of Therapy:  Group Therapy  Participation Level:  Active  Participation Quality:  Attentive  Affect:  Appropriate  Cognitive: Confused  Insight:  Limited  Engagement in Therapy:  Engaged  Modes of Intervention:  Discussion, Exploration and Socialization  Summary of Progress/Problems: Today's group focused on the term Diagnosis.  Participants were asked to define the term, and then pronounce whether it is a negative, positive or neutral term.  Emily Duarte did not contribute spontaneously, but spoke when asked questions directly.  She related 2 rather lengthy stories that were unrelated to the questions asked her in everyone's mind but her own.  Limited insight, poor judgment, circumstantial thought.  Daryel Gerald B 08/11/2012 , 2:20 PM

## 2012-08-12 NOTE — Clinical Social Work Note (Signed)
[facility] [employer] Group Therapy  08/12/2012 , 2:08 PM    Type of Therapy:  [employer] Presentation  Participation Level:  Active  Participation Quality:  Attentive  Affect:  Blunted  Cognitive:  Oriented  Insight:  Limited  Engagement in Therapy:  Engaged  Modes of Intervention:  Discussion, Education and Socialization  Summary of Progress/Problems:  Onalee Hua from [employer] came to present his recovery story and play the guitar.  [name] listened attentively throughout.  She asked questions about the audition process at [facility], and related a story of a friend of hers who tried out.  Daryel Gerald B 08/12/2012 , 2:08 PM

## 2012-08-12 NOTE — Progress Notes (Signed)
D: Pt denies SI/HI/AVH.  Per pt, she feels like there is a "haze" over her. Pt stated that she is having difficulty gathering her thoughts quickly. Per pt, she recently switched rooms and have not adjusted to the direction of the bed and is use to sleeping in a certain direction. Per pt, if she lay at the foot of the bed she may be able to think quicker. Pt then went on the say that in her house the couch is positioned towards the t.v and that's the direction she watch t.v in. Pt then stated that she have animals living in her base board and she have been having a hard time explaining this to the landlord. Pt presents with flight of ideas and some delusions.  A: Medications administered as ordered per MD. Verbal support given. Pt encouraged to attend groups. Pt wound site assessed, cleaned and dressing changed. 15 minute checks performed for safety.  R: Pt is pleasant and receptive to treatment.  Pt attends groups and take meds.

## 2012-08-12 NOTE — Progress Notes (Signed)
Adult Psychoeducational Group Note  Date: 08/12/2012  Time: 11:00am  Group Topic/Focus:  Crisis Planning: The purpose of this group is to help patients create a crisis plan for use upon discharge or in the future, as needed.  Participation Level: Active  Participation Quality: Appropriate, Sharing and Supportive  Affect: Appropriate  Cognitive: Appropriate  Insight: Appropriate  Engagement in Group: Supportive  Modes of Intervention: Education, Problem-solving and Support  Additional Comments: none  Teondra Newburg M  08/12/2012, 5:19 PM  

## 2012-08-12 NOTE — Progress Notes (Signed)
Patient ID: Emily Duarte, female   DOB: June 20, 1950, 62 y.o.   MRN: [id number] [facility] MD Progress Note  08/12/2012 10:59 AM Emily Duarte  MRN:  [id number] Subjective:  "I am not good in my head, I am kind of hazy".  Objective: Patient reports that she is making progress, sleeping better, feeling less delusional,  depressed and anxious. However, she reports ongoing poor memory, lack of motivation and concentration. Patient is compliant with her medication with no adverse reactions reported so far.   Diagnosis:  Bipolar 1 disorder, mixed, severe   ADL's:  Impaired  Sleep: Good  Appetite:  Good  Suicidal Ideation:  denies Homicidal Ideation:  denies AEB (as evidenced by): patient's report of improving sleep, resolving symptoms.  Psychiatric Specialty Exam: Review of Systems  Constitutional: Negative.  Negative for fever, chills, weight loss, malaise/fatigue and diaphoresis.  HENT: Negative for congestion and sore throat.   Eyes: Negative for blurred vision, double vision and photophobia.  Respiratory: Negative for cough, shortness of breath and wheezing.   Cardiovascular: Negative for chest pain, palpitations and PND.  Gastrointestinal: Negative for heartburn, nausea, vomiting, abdominal pain, diarrhea and constipation.  Musculoskeletal: Negative for myalgias, joint pain and falls.  Neurological: Negative for dizziness, tingling, tremors, sensory change, speech change, focal weakness, seizures, loss of consciousness, weakness and headaches.  Endo/Heme/Allergies: Negative for polydipsia. Does not bruise/bleed easily.  Psychiatric/Behavioral: Negative for depression, suicidal ideas, hallucinations, memory loss and substance abuse. The patient is not nervous/anxious and does not have insomnia.     Blood pressure 135/83, pulse 99, temperature 97.1 F (36.2 C), temperature source Oral, resp. rate 18, height 5\' 4"  (1.626 m), weight 102.059 kg (225 lb).Body mass index is 38.6  kg/(m^2).  General Appearance: fairly groomed  Patent attorney::  Good  Speech:  Clear and Coherent  Volume:  Normal  Mood:  Dysphoric.  Affect:  Congruent  Thought Process:  linear  Orientation:  Full (Time, Place, and Person)  Thought Content:  Denies AVH, no longer focused on snakes or small animals.  Suicidal Thoughts:  No  Homicidal Thoughts:  No  Memory:  Immediate;   Fair  Judgement:  fair  Insight:  Present  Psychomotor Activity:  Normal  Concentration:  Fair  Recall:  Fair  Akathisia:  No  Handed:  Right  AIMS (if indicated):     Assets:  Communication Skills Desire for Improvement Housing Physical Health Social Support  Sleep:  Number of Hours: 6.75   Current Medications: Current Facility-Administered Medications  Medication Dose Route Frequency Provider Last Rate Last Dose  . acetaminophen (TYLENOL) tablet 650 mg  650 mg Oral Q6H PRN Sanjuana Kava, NP      . alum & mag hydroxide-simeth (MAALOX/MYLANTA) 200-200-20 MG/5ML suspension 30 mL  30 mL Oral Q4H PRN Sanjuana Kava, NP      . divalproex (DEPAKOTE) DR tablet 250 mg  250 mg Oral BID Sanjuana Kava, NP   250 mg at 08/12/12 0809  . hydrOXYzine (ATARAX/VISTARIL) tablet 25 mg  25 mg Oral Q4H PRN Sanjuana Kava, NP      . magnesium hydroxide (MILK OF MAGNESIA) suspension 30 mL  30 mL Oral Daily PRN Sanjuana Kava, NP      . risperiDONE (RISPERDAL) tablet 1.5 mg  1.5 mg Oral QHS Verne Spurr, PA-C   1.5 mg at 08/11/12 2212    Lab Results:  No results found for this or any previous visit (from the past 48 hour(s)).  Physical Findings: Cyst is packed with visible tail, appears smaller, no erythema. AIMS: Facial and Oral Movements Muscles of Facial Expression: None, normal Lips and Perioral Area: None, normal Jaw: None, normal Tongue: None, normal,Extremity Movements Upper (arms, wrists, hands, fingers): None, normal Lower (legs, knees, ankles, toes): None, normal, Trunk Movements Neck, shoulders, hips: None, normal,  Overall Severity Severity of abnormal movements (highest score from questions above): None, normal Incapacitation due to abnormal movements: None, normal Patient's awareness of abnormal movements (rate only patient's report): No Awareness, Dental Status Current problems with teeth and/or dentures?: No Does patient usually wear dentures?: No  CIWA:  CIWA-Ar Total: 0 COWS:  COWS Total Score: 1  Treatment Plan Summary: Daily contact with patient to assess and evaluate symptoms and progress in treatment Medication management  Plan: 1. Will continue patient on  current plan of care.  2. Possible placement in assisted living environment upon discharge. 3. ELOS 3-5 days.  Medical Decision Making Problem Points:  Established problem, stable/improving (1) and Review of last therapy session (1) Data Points:  Review or order medicine tests (1)  I certify that inpatient services furnished can reasonably be expected to improve the patient's condition.  Thedore Mins, MD 10:59 AM 08/12/2012

## 2012-08-12 NOTE — [facility] Group Notes (Signed)
[facility] LCSW Aftercare Discharge Planning Group Note   08/12/2012 9:49 AM  Participation Quality:  Appropriate  Mood/Affect:  Appropriate  Depression Rating:  N/a   Anxiety Rating:  N/a   Thoughts of Suicide:  No Will you contract for safety?   NA  Current AVH:  No although pt still demonstrating delusional thinking.   Plan for Discharge/Comments:  Pt's son is getting new apartment set up for her. [name] reported that she feels fine but is "hazy" in thinking and not as clearheaded as she had been over the past few days. She feels that she needs more time at the hospital and talked more about the "little animal" that scratches and bites her at her home. She stated that she feels safe here and does not think the animal will bother her while at the hospital.   Transportation Means: son/family  Supports: son/family  Smart, Ledell Peoples

## 2012-08-12 NOTE — Progress Notes (Signed)
Recreation Therapy Notes   Date: 04.23.2014  Time: 9:30am Location: [address] Day Room      Group Topic/Focus: Leisure Education & Memory  Participation Level: Active  Participation Quality: Appropriate  Affect: Euthymic  Cognitive: Oriented   Additional Comments: Activity: Leisure and Recreation Hot Potato & Memory Sequence ; Explanation: Leisure and Recreation Hot Potato: Dayna Ramus was played for this game. Round 1 - A small foam football (yellow and orange) was passed from patient to patient. When the music stopped patients were asked to state a leisure and recreation activity they enjoy participating in. Round 2 - A second small foam football (white and orange) was introduced into the rotation. When the music stopped if the patient was holding the yellow and orange football the patient stated a leisure and recreation activity, if the patient was holding the white and orange football the patient stated a benefit of leisure and recreation.; Memory Sequence - Patients were asked to create a sequence of actions. Actions were added to the sequence after the entire sequence had traveled around the room one time. Each patient was given the opportunity to add an action to the sequence. Sequence included the following actions: Touch nose, Clap once, Stomp both feet, Snap, Thumbs up, Tap heels together, Touch eye, Kick forward and Touch finger to knee.   Patient actively participated in group activities. Patient was able to state both a leisure and recreation activity she enjoys: walking and reading. Patient needed little assistance completing the sequence of actions created by group. Patient did not need assistance until the sequence exceeded 4 actions. Patient added an action to the sequence at the appropriate time.   Marykay Lex Audyn Dimercurio, LRT/CTRS  Jearl Klinefelter 08/12/2012 12:34 PM

## 2012-08-12 NOTE — Progress Notes (Signed)
Chaplain provided brief support with pt while rounding on unit.  Encounter occurred in pt room.   Pt welcoming of chaplain presence and talkative. Conversation was non-linear.  Pt spoke about home where she feels there are animals or rodents.  Is fearful that she has been bitten or attacked by rodents and is concerned about discharging back to this environment.  Chaplain worked with pt around fear and what she needs in order to feel safe in hospital environment.  Pt appreciates prayer and chaplain prayed with pt for rest and peace, as she describes not having been able to rest.   Pt describes being supported by her son, but feels he does not fully understand what she is experiencing.  Chaplain encouraged pt to be as open as possible with care team.

## 2012-08-12 NOTE — Progress Notes (Signed)
Patient ID: Emily Duarte, female   DOB: October 18, 1950, 62 y.o.   MRN: [id number]  D: Pt denies SI/HI/AVH. Pt is pleasant and cooperative and continues to be hyperverbal. Pt state sshe has been getting better since being here. Pt states the groups "are helping me". Pt state sshe feels better after her son and grandchildren came to visit.   A: Pt was offered support and encouragement. Pt was given scheduled medications. Pt was encourage to attend groups. Q 15 minute checks were done for safety.   R:Pt attends groups and interacts well with peers and staff. Pt is taking medication. Pt has no complaints at this time.Pt receptive to treatment and safety maintained on unit.

## 2012-08-13 NOTE — [facility] Group Notes (Signed)
[facility] LCSW Group Therapy  08/13/2012 2:38 PM  Type of Therapy:  Group Therapy  Participation Level:  Active  Participation Quality:  Attentive  Affect:  Appropriate  Cognitive:  Improving, but still demonstrating delusional thinking at times.   Insight:  Limited  Engagement in Therapy:  Engaged  Modes of Intervention:  Discussion, Education, Exploration, Socialization and Support  Summary of Progress/Problems: Today's topic of discussion was "Finding balance in life." [name] talked about what balance means to her and identified ways to maintain/acheive balance in addtion to listing things that sometimes cause her to lose balance. [name] stated that she sometimes has problems in her apartment and is unable to pick things up without dropping them. She noted that this is due to the oldness of her apartment and cracked windows (disorganized/delusional thinking noted here). [name] stated that taking medications and jogging help her to achieve balance. She proudly told the group that she started with taking walks to calm her nerves but began jogging recently.   Smart, Emily Duarte 08/13/2012, 2:38 PM

## 2012-08-13 NOTE — Progress Notes (Signed)
Patient ID: Emily Duarte, female   DOB: [birth year], 62 y.o.   MRN: [id number] Patient presents with clearer thought processes today; bright affect.  She denies any SI/HI/AVH.  She has not mentioned "the snakes."  Her head wound is healing nicely.  Patient is medication compliant.  She is progressing with her treatment here.  She is looking forward to moving in her new apartment that he son procured for her.  Patient has been attending groups and participating in her treatment.  Continue to monitor medication management and MD orders.  Safety checks completed every 15 minutes per protocol.  Patient's behavior has been appropriate.

## 2012-08-13 NOTE — [facility] Suicide Risk Assessment (Signed)
[facility] INPATIENT:  Family/Significant Other Suicide Prevention Education  Suicide Prevention Education:  Education Completed; No one has been identified by the patient as the family member/significant other with whom the patient will be residing, and identified as the person(s) who will aid the patient in the event of a mental health crisis (suicidal ideations/suicide attempt).  With written consent from the patient, the family member/significant other has been provided the following suicide prevention education, prior to the and/or following the discharge of the patient.  The suicide prevention education provided includes the following:  Suicide risk factors  Suicide prevention and interventions  National Suicide Hotline telephone number  [facility] assessment telephone number  [facility] Emergency Assistance 911  [facility] and/or Residential Mobile Crisis Unit telephone number  Request made of family/significant other to:  Remove weapons (e.g., guns, rifles, knives), all items previously/currently identified as safety concern.    Remove drugs/medications (over-the-counter, prescriptions, illicit drugs), all items previously/currently identified as a safety concern.  The family member/significant other verbalizes understanding of the suicide prevention education information provided.  The family member/significant other agrees to remove the items of safety concern listed above.  Emily Duarte did not endorse SI at the time of her admission, nor has she complained of suicidal thoughts while int h hospital.  No SPE required.  Daryel Gerald B 08/13/2012, 10:38 AM

## 2012-08-13 NOTE — Progress Notes (Signed)
Adult Psychoeducational Group Note  Date:  08/13/2012 Time:  0930 Group Topic/Focus:  Overcoming Stress:   The focus of this group is to define stress and help patients assess their triggers.  Participation Level:  Active  Participation Quality:  Appropriate  Affect:  Appropriate  Cognitive:  Oriented  Insight: Good  Engagement in Group:  Engaged  Modes of Intervention:  Discussion and Education  Additional Comments:  Pt attend group and was able to share positively.  Emily Duarte E 08/13/2012, 2:16 PM

## 2012-08-13 NOTE — Progress Notes (Signed)
Patient ID: Emily Duarte, female   DOB: 1950-07-22, 62 y.o.   MRN: [id number]  D: Pt denies SI/HI/AVH. Pt is pleasant and cooperative. Pt states "I'm feeling loads better". Pt doesn't know where she is going when  she leaves here, but she says she is ready. Pt  appears to be improving her overall affect/ behavior from her admission. Pt did not speak of the snakes tonight.   A: Pt was offered support and encouragement. Pt was given scheduled medications. Pt was encourage to attend groups. Q 15 minute checks were done for safety. Pt wound was clean, dry and shows no signs of infection.  R:Pt attends groups and interacts well with peers and staff. Pt is taking medication. Pt has no complaints at this time.Pt receptive to treatment and safety maintained on unit.

## 2012-08-13 NOTE — B[facility] Group Notes (Signed)
[facility] LCSW Aftercare Discharge Planning Group Note   08/13/2012 10:01 AM  Participation Quality:  Appropriate   Mood/Affect:  Appropriate  Depression Rating:  None   Anxiety Rating:  None   Thoughts of Suicide:  No Will you contract for safety?   NA  Current AVH:  No  Plan for Discharge/Comments:  Patient reported that she feels clearer today and presents with pleasant demeanor this morning. Doctor planning to discharge [facility] Friday. Attempt to contact pt's son will be made today to make him aware of plan. Pt plans to move into new apartment being set up by her son after discharge and will f/u at [facility] outpatient.   Transportation Means: son  Supports: Chiropractor, [employer]

## 2012-08-13 NOTE — Care Management Utilization Note (Signed)
PER STATE REGULATIONS 482.30  THIS CHART WAS REVIEWED FOR MEDICAL NECESSITY WITH RESPECT TO THE PATIENT'S ADMISSION/ DURATION OF STAY.  NEXT REVIEW DATE: 08/17/12

## 2012-08-13 NOTE — Progress Notes (Signed)
Patient ID: Emily Duarte, female   DOB: Mar 26, 1951, 62 y.o.   MRN: [id number] [facility] MD Progress Note  08/13/2012 10:12 AM Emily Duarte  MRN:  [id number] Subjective:  "I am doing better today, my memory is coming back".  Objective: Patient reports that she is making progress, feeling less foggy, less depressed and sleeping better. She reports that her motivation and memory are improving. She is looking forward to her son finding her an apartment upon discharge. Patient is compliant with her medication with no adverse reactions reported so far.   Diagnosis:  Bipolar 1 disorder, mixed, severe   ADL's:  fair  Sleep: Good  Appetite:  Good  Suicidal Ideation:  denies Homicidal Ideation:  denies AEB (as evidenced by): patient's report of improving sleep, resolving symptoms.  Psychiatric Specialty Exam: Review of Systems  Constitutional: Negative.  Negative for fever, chills, weight loss, malaise/fatigue and diaphoresis.  HENT: Negative for congestion and sore throat.   Eyes: Negative for blurred vision, double vision and photophobia.  Respiratory: Negative for cough, shortness of breath and wheezing.   Cardiovascular: Negative for chest pain, palpitations and PND.  Gastrointestinal: Negative for heartburn, nausea, vomiting, abdominal pain, diarrhea and constipation.  Musculoskeletal: Negative for myalgias, joint pain and falls.  Neurological: Negative for dizziness, tingling, tremors, sensory change, speech change, focal weakness, seizures, loss of consciousness, weakness and headaches.  Endo/Heme/Allergies: Negative for polydipsia. Does not bruise/bleed easily.  Psychiatric/Behavioral: Negative for depression, suicidal ideas, hallucinations, memory loss and substance abuse. The patient is not nervous/anxious and does not have insomnia.     Blood pressure 147/87, pulse 92, temperature 97.1 F (36.2 C), temperature source Oral, resp. rate 18, height 5\' 4"  (1.626 m), weight  102.059 kg (225 lb).Body mass index is 38.6 kg/(m^2).  General Appearance: fairly groomed  Patent attorney::  Good  Speech:  Clear and Coherent  Volume:  Normal  Mood:  Dysphoric.  Affect:  Congruent  Thought Process:  linear  Orientation:  Full (Time, Place, and Person)  Thought Content:  Denies AVH, no longer focused on snakes or small animals.  Suicidal Thoughts:  No  Homicidal Thoughts:  No  Memory:  Immediate;   Fair  Judgement:  fair  Insight:  Present  Psychomotor Activity:  Normal  Concentration:  Fair  Recall:  Fair  Akathisia:  No  Handed:  Right  AIMS (if indicated):     Assets:  Communication Skills Desire for Improvement Housing Physical Health Social Support  Sleep:  Number of Hours: 5.75   Current Medications: Current Facility-Administered Medications  Medication Dose Route Frequency Provider Last Rate Last Dose  . acetaminophen (TYLENOL) tablet 650 mg  650 mg Oral Q6H PRN Sanjuana Kava, NP      . alum & mag hydroxide-simeth (MAALOX/MYLANTA) 200-200-20 MG/5ML suspension 30 mL  30 mL Oral Q4H PRN Sanjuana Kava, NP      . divalproex (DEPAKOTE) DR tablet 250 mg  250 mg Oral BID Sanjuana Kava, NP   250 mg at 08/13/12 0755  . hydrOXYzine (ATARAX/VISTARIL) tablet 25 mg  25 mg Oral Q4H PRN Sanjuana Kava, NP      . magnesium hydroxide (MILK OF MAGNESIA) suspension 30 mL  30 mL Oral Daily PRN Sanjuana Kava, NP      . risperiDONE (RISPERDAL) tablet 1.5 mg  1.5 mg Oral QHS Verne Spurr, PA-C   1.5 mg at 08/12/12 2119    Lab Results:  No results found for this or any  previous visit (from the past 48 hour(s)).  Physical Findings: Cyst is packed with visible tail, appears smaller, no erythema. AIMS: Facial and Oral Movements Muscles of Facial Expression: None, normal Lips and Perioral Area: None, normal Jaw: None, normal Tongue: None, normal,Extremity Movements Upper (arms, wrists, hands, fingers): None, normal Lower (legs, knees, ankles, toes): None, normal, Trunk  Movements Neck, shoulders, hips: None, normal, Overall Severity Severity of abnormal movements (highest score from questions above): None, normal Incapacitation due to abnormal movements: None, normal Patient's awareness of abnormal movements (rate only patient's report): No Awareness, Dental Status Current problems with teeth and/or dentures?: No Does patient usually wear dentures?: No  CIWA:  CIWA-Ar Total: 0 COWS:  COWS Total Score: 1  Treatment Plan Summary: Daily contact with patient to assess and evaluate symptoms and progress in treatment Medication management  Plan: 1. Will continue patient on  current plan of care.  2. Possible placement in assisted living environment upon discharge. 3. ELOS 3-5 days. 4. Valproic acid level on 08/14/12  Medical Decision Making Problem Points:  Established problem, stable/improving (1) and Review of last therapy session (1) Data Points:  Review or order medicine tests (1)  I certify that inpatient services furnished can reasonably be expected to improve the patient's condition.  Thedore Mins, MD 10:12 AM 08/13/2012

## 2012-08-13 NOTE — Tx Team (Signed)
  Interdisciplinary Treatment Plan Update   Date Reviewed:  08/13/2012  Time Reviewed:  10:49 AM  Progress in Treatment:   Attending groups: Yes Participating in groups: Yes Taking medication as prescribed: Yes  Tolerating medication: Yes Family/Significant other contact made: Yes  Patient understands diagnosis: Yes  Discussing patient identified problems/goals with staff: Yes Medical problems stabilized or resolved: Yes Denies suicidal/homicidal ideation: Yes  In tx team Patient has not harmed self or others: Yes  For review of initial/current patient goals, please see plan of care.  Estimated Length of Stay:  1-3 days  Reason for Continuation of Hospitalization: Delusions  Medication stabilization  New Problems/Goals identified:  N/A  Discharge Plan or Barriers:   return home, follow up outpt  Additional Comments:  I called son today to let him know that in all liklihood his mother would be d/ced tomorrow.  He is prepared to take her home.  Attendees:  Signature: Thedore Mins, MD 08/13/2012 10:49 AM   Signature: Richelle Ito, LCSW 08/13/2012 10:49 AM  Signature: Verne Spurr, PA 08/13/2012 10:49 AM  Signature: Joslyn Devon, RN 08/13/2012 10:49 AM  Signature:  08/13/2012 10:49 AM  Signature:  08/13/2012 10:49 AM  Signature:   08/13/2012 10:49 AM  Signature:    Signature:    Signature:    Signature:    Signature:    Signature:      Scribe for Treatment Team:   Richelle Ito, LCSW  08/13/2012 10:49 AM

## 2012-08-13 NOTE — Progress Notes (Signed)
Pt. Attended wrap up group this pm.  Pt. Reported best part of day was when a musician played the guitar for them.  Pt states her goal is to get better every day and then talks about how the telephone co.  use to take her on seminars.  Unsure if this statement is part truth and part fiction or if pt. Is relating it to something she has seen or heard.  Pt. Is still convinced that she has a snake in her apartment and talks about feeding it and shows Clinical research associate places that it has bit her, then talks about leaving her door open when it was snowing.  Pt. Is actually describing a mouse when she says there is a snake in her apt. So again unsure if there is any truth at all in her story if she is possibly seeing a mouse run across the floor on occasion, because she said it is small and gray and darts across the room.  Pt. Is very calm, denies AH but admits at times that she is not sure about the snake and wants to get her mind clear.  Pt. Is calm and cooperative this pm and presently resting quietly.

## 2012-08-14 LAB — VALPROIC ACID LEVEL: Valproic Acid Lvl: 61.2 ug/mL (ref 50.0–100.0)

## 2012-08-14 MED ORDER — DIVALPROEX SODIUM 250 MG PO DR TAB: 250.0000 mg | DELAYED_RELEASE_TABLET | Freq: Two times a day (BID) | ORAL | Status: DC

## 2012-08-14 MED ORDER — RISPERIDONE 0.5 MG PO TABS: 1.5000 mg | ORAL_TABLET | Freq: Every day | ORAL | Status: DC

## 2012-08-14 NOTE — Progress Notes (Signed)
[facility] Adult Case Management Discharge Plan :  Will you be returning to the same living situation after discharge: No. Pt moving into new apt set up by son. At discharge, do you have transportation home?:Yes,  Hessie Diener (son) coming at 5PM Do you have the ability to pay for your medications:Yes,  medicare  Release of information consent forms completed and in the chart;  Patient's signature needed at discharge.  Patient to Follow up at: Follow-up Information   Follow up with [facility] On 08/31/2012. (Arrive at 8:30 for your 9:00 appointment with Dr Lolly Mustache )    Contact information:   [address]  [place] [phone]      Patient denies SI/HI:   Yes,  yes    Safety Planning and Suicide Prevention discussed:  Yes,  yes  Shey Yott, Ledell Peoples 08/14/2012, 10:27 AM

## 2012-08-14 NOTE — Discharge Summary (Signed)
Physician Discharge Summary Note  Patient:  Emily Duarte is an 62 y.o., female MRN:  [id number] DOB:  [birth year] Patient phone:  [phone] (home)  Patient address:   [address] [place] [address],   Date of Admission:  08/06/2012 Date of Discharge: 08/14/2012  Reason for Admission:  Delusional  Discharge Diagnoses: Principal Problem:   Bipolar 1 disorder, mixed, severe Active Problems:   Cyst of soft tissue  Review of Systems  Constitutional: Negative.  Negative for fever, chills, weight loss, malaise/fatigue and diaphoresis.  HENT: Negative for congestion and sore throat.   Eyes: Negative for blurred vision, double vision and photophobia.  Respiratory: Negative for cough, shortness of breath and wheezing.   Cardiovascular: Negative for chest pain, palpitations and PND.  Gastrointestinal: Negative for heartburn, nausea, vomiting, abdominal pain, diarrhea and constipation.  Musculoskeletal: Negative for myalgias, joint pain and falls.  Neurological: Negative for dizziness, tingling, tremors, sensory change, speech change, focal weakness, seizures, loss of consciousness, weakness and headaches.  Endo/Heme/Allergies: Negative for polydipsia. Does not bruise/bleed easily.  Psychiatric/Behavioral: Negative for depression, suicidal ideas, hallucinations, memory loss and substance abuse. The patient is not nervous/anxious and does not have insomnia.   Discharge Diagnoses:  AXIS I: Bipolar 1 disorder, mixed, severe  AXIS II: Deferred  AXIS III: History reviewed. No pertinent past medical history.  AXIS IV: other psychosocial or environmental problems and problems related to social environment  AXIS V: 61-70 mild symptoms   Level of Care:  OP  Hospital Course:  Bliss was admitted after presenting with her son to the emergency room reporting "snake bite" to her head, neck and arms. She was evaluated and treated for a sebaceous cyst to her scalp above the occiput.  The area was opened and drained and left open.  The patient received a telepsych evaluation that recommended in patient admission due to her delusional and psychotic state.   Ms. Hillesheim reported that a "pet rattlesnake" was coming into her house at night and biting her. Sometimes it was in the yard, sometimes it was in the walls. She did not have any worrisome bite marks or trauma to her skin other than the cyst to her scalp.  She has a history of Bipolar disorder but could not recall her last admission.  She had been isolating to her apartment and reported poor sleep.  There was also some concern about medication compliance as well.  She was given medical clearance and transferred to [facility] for further stabilization and crisis management. Her labs were unremarkable with the exception of a valproic acid level below sub-therapeutic level.      Jaclin was delusional but cooperative, anxious, but pleasant. She was oriented to the unit and evaluated by the clinical staff. For her current symptoms she was started on Risperdal which she had been on previously and reported as having done well. She was titrated to a single pm dose of 1.5mg  at hs. For anxiety she was given hydroxyzine 25mg  on a prn basis, and for mood stabilization she was continued on depakote DR 250mg  po BID.        Her cyst had sealed over but continued to drain, so WOC RN was consulted and the area was reopened and packed with iodeform gauze. Dressing changes were done daily without complications. [place] was encouraged to participate in unit programming and did so. She stated she had missed the company of other people since living in her apartment. Ms. Posas worked closely with her son and her  CM and agreed to move into an assisted living apartment with people nearer her own age.  Her son made all the arrangements and completed most of the move prior to her discharge. She was looking forward to her new arrangement.  Ayvah continued to  improve and was no longer fixated on "snakes and snake bites." She was organized and appropriate. She denied SI/HI and voiced no AVH. She was in much improved condition and was stable for discharge.       Consults:  wound care nurse Significant Diagnostic Studies:  CBC w diff, CMP, UDS, UA, BAL  Discharge Vitals:   Blood pressure 102/69, pulse 92, temperature 97.3 F (36.3 C), temperature source Oral, resp. rate 18, height 5\' 4"  (1.626 m), weight 102.059 kg (225 lb). Body mass index is 38.6 kg/(m^2). Lab Results:   No results found for this or any previous visit (from the past 72 hour(s)).  Physical Findings: AIMS: Facial and Oral Movements Muscles of Facial Expression: None, normal Lips and Perioral Area: None, normal Jaw: None, normal Tongue: None, normal,Extremity Movements Upper (arms, wrists, hands, fingers): None, normal Lower (legs, knees, ankles, toes): None, normal, Trunk Movements Neck, shoulders, hips: None, normal, Overall Severity Severity of abnormal movements (highest score from questions above): None, normal Incapacitation due to abnormal movements: None, normal Patient's awareness of abnormal movements (rate only patient's report): No Awareness, Dental Status Current problems with teeth and/or dentures?: No Does patient usually wear dentures?: No  CIWA:  CIWA-Ar Total: 0 COWS:  COWS Total Score: 1  Psychiatric Specialty Exam: See Psychiatric Specialty Exam and Suicide Risk Assessment completed by Attending Physician prior to discharge.  Discharge destination:  Home  Is patient on multiple antipsychotic therapies at discharge:  No   Has Patient had three or more failed trials of antipsychotic monotherapy by history:  No  Recommended Plan for Multiple Antipsychotic Therapies: Not applicable  Discharge Orders   Future Orders Complete By Expires     Diet - low sodium heart healthy  As directed     Discharge instructions  As directed     Comments:      Take  all of your medications as directed. Be sure to keep all of your follow up appointments.  If you are unable to keep your follow up appointment, call your Doctor's office to let them know, and reschedule.  Make sure that you have enough medication to last until your appointment. Be sure to get plenty of rest. Going to bed at the same time each night will help. Try to avoid sleeping during the day.  Increase your activity as tolerated. Regular exercise will help you to sleep better and improve your mental health. Eating a heart healthy diet is recommended. Try to avoid salty or fried foods. Be sure to avoid all alcohol and illegal drugs.    Increase activity slowly  As directed         Medication List    TAKE these medications     Indication   divalproex 250 MG DR tablet  Commonly known as:  DEPAKOTE  Take 1 tablet (250 mg total) by mouth 2 (two) times daily. For mood stabilization.   Indication:  Manic Phase of Manic-Depression     risperiDONE 0.5 MG tablet  Commonly known as:  RISPERDAL  Take 3 tablets (1.5 mg total) by mouth at bedtime. For mental clarity and psychosis.   Indication:  delusions           Follow-up Information  Follow up with [facility] On 08/31/2012. (Arrive at 8:30 for your 9:00 appointment with Dr Lolly Mustache )    Contact information:   [address]  [place] [phone]      Follow-up recommendations:   Activities: Resume activity as tolerated. Diet: Heart healthy low sodium diet Tests: Follow up testing will be determined by your out patient provider. Comments:    Total Discharge Time:  Greater than 30 minutes.  Signed: Rona Ravens. Evett Kassa RPAC 2:12 PM 08/14/2012

## 2012-08-14 NOTE — Tx Team (Signed)
Interdisciplinary Treatment Plan Update  Date Reviewed: 08/13/2012  Time Reviewed: 10:49 AM  Progress in Treatment:  Attending groups: Yes  Participating in groups: Yes  Taking medication as prescribed: Yes  Tolerating medication: Yes  Family/Significant other contact made: Yes, pt's son, Hessie Diener.  Patient understands diagnosis: Yes.  Discussing patient identified problems/goals with staff: Yes  Medical problems stabilized or resolved: Yes  Denies suicidal/homicidal ideation: Yes In tx team  Patient has not harmed self or others: Yes  For review of initial/current patient goals, please see plan of care.  Estimated Length of Stay: d/c today  Reason for Continuation of Hospitalization: None. D/c today. New Problems/Goals identified: N/A  Discharge Plan or Barriers: Moving into new apt set up by son, follow up at [facility] o/p.  Additional Comments: none Attendees:  Signature: Thedore Mins, MD  08/14/2012 10:30 AM   Signature: Trula Slade, MSW intern 08/14/2012 10:30 AM   Signature: Verne Spurr, PA  08/14/2012 10:30 AM   Signature: Joslyn Devon, RN  08/14/2012 10:30 AM   Signature:    Signature:    Signature:    Signature:    Signature:    Signature:    Signature:    Signature:    Signature:    Scribe for Treatment Team:  Trula Slade, MSW intern, 08/14/2012 10:30 AM

## 2012-08-14 NOTE — [facility] Suicide Risk Assessment (Signed)
Suicide Risk Assessment  Discharge Assessment     Demographic Factors:  Caucasian, Living alone and female  Mental Status Per Nursing Assessment::   On Admission:  NA  Current Mental Status by Physician: patient denies suicidal ideation,intent or plan.  Loss Factors: Decrease in vocational status, Decline in physical health and Financial problems/change in socioeconomic status  Historical Factors: NA  Risk Reduction Factors:   Positive social support and Positive therapeutic relationship  Continued Clinical Symptoms:  Resolving delusions  Cognitive Features That Contribute To Risk:  Closed-mindedness Polarized thinking    Suicide Risk:  Minimal: No identifiable suicidal ideation.  Patients presenting with no risk factors but with morbid ruminations; may be classified as minimal risk based on the severity of the depressive symptoms  Discharge Diagnoses:   AXIS I:  Bipolar 1 disorder, mixed, severe  AXIS II:  Deferred AXIS III:  History reviewed. No pertinent past medical history. AXIS IV:  other psychosocial or environmental problems and problems related to social environment AXIS V:  61-70 mild symptoms  Plan Of Care/Follow-up recommendations:  Activity:  as tolerated Diet:  healthy Tests:  Valproic acid level routinely. Current depakote level-53.9 Other:  patient to keep her after care appointment.  Is patient on multiple antipsychotic therapies at discharge:  No   Has Patient had three or more failed trials of antipsychotic monotherapy by history:  No  Recommended Plan for Multiple Antipsychotic Therapies: N/A  Sharesa Kemp,MD 08/14/2012, 9:46 AM

## 2012-08-14 NOTE — Progress Notes (Signed)
Nrsg DC Pt and her son are present for DC instructions with this Clinical research associate. After MD  Completed DC order and DC SRA, pt was given AVS, instructions discussed with both she and her son and they both stated they understood and will comply. Wound care performed to post aspect of pt's scalp ( saturated wick of nu-gauze  removed and sterile piece delicately re-packed down into  Scalp). No s / sx of infection identified ( fever, pain at site, increased drainage,) and pt and her son were both instructed on how to perform this care, ie wet - - to- dry wound care and son given remainder of nu-gauze bottle to continue with wound care. Pt denies SI within the past 24 hrs,  She rates her depression and hopelessness  " 2 / 2 " and states she is "getting better each day" . Pt was given prescriptions for meds as well as med samples and then all belongings previously secured  Were returned to her as well.

## 2012-08-14 NOTE — Progress Notes (Signed)
/  25/2014  Time: 11:56 AM  Group Topic/Focus:  Relapse Prevention Planning: The focus of this group is to define relapse and discuss the need for planning to combat relapse.  Participation Level: Active  Participation Quality: Appropriate, Sharing and Supportive  Affect: Appropriate and Excited  Cognitive: Appropriate  Insight: Appropriate  Engagement in Group: Engaged and Supportive  Modes of Intervention: Discussion, Education, Problem-solving and Support  Additional Comments: Pt was very cheerful ,pt fell asleep during group. pt is going home today  Gracy Racer  08/14/2012, 11:56 AM

## 2012-08-15 NOTE — [facility] Group Notes (Signed)
[facility] Group Notes: (Clinical Social Work)   08/15/2012      Type of Therapy:  Group Therapy   Participation Level:  Did Not Attend    Ambrose Mantle, LCSW 08/15/2012, 1:24 PM

## 2012-08-17 NOTE — Discharge Summary (Signed)
Seen and agreed. Rashawn Rolon, MD 

## 2012-08-19 NOTE — Progress Notes (Signed)
Patient Discharge Instructions:  Next Level Care Provider Has Access to the EMR, 08/19/12 Records provided to [facility] via [facility]/Epic access.  Jerelene Redden, 08/19/2012, 1:23 PM

## 2012-08-20 ENCOUNTER — Encounter (HOSPITAL_COMMUNITY): Payer: Self-pay | Admitting: *Deleted

## 2012-08-20 ENCOUNTER — Emergency Department (INDEPENDENT_AMBULATORY_CARE_PROVIDER_SITE_OTHER)
Admission: EM | Admit: 2012-08-20 | Discharge: 2012-08-20 | Disposition: A | Discharge status: Home / Self Care | Payer: Medicare Other | Source: Home / Self Care | Attending: Family Medicine | Admitting: Family Medicine

## 2012-08-20 DIAGNOSIS — L723 Sebaceous cyst: Secondary | ICD-10-CM

## 2012-08-20 MED ORDER — MUPIROCIN 2 % EX OINT: TOPICAL_OINTMENT | Freq: Three times a day (TID) | CUTANEOUS | Status: DC

## 2012-08-20 MED ORDER — DOXYCYCLINE HYCLATE 100 MG PO CAPS: 100.0000 mg | ORAL_CAPSULE | Freq: Two times a day (BID) | ORAL | Status: DC

## 2012-08-20 NOTE — ED Notes (Signed)
Pt  Has  A  Large mass  On back of head    Which has  Gotten  Bigger  Over  A  3  Month    Period     She  Sates  Recently  Released  From  [facility]     -   Pt  States      Has  No  pcp  - pt  States  The       Mass  Has  Been  Draining    And  Has  Crusty  Exudate   Son  States  Pt  Completed  Course  Of  antyi  Biotics  In the  Not  So  Distant past  For  This  Condition

## 2012-08-20 NOTE — ED Provider Notes (Signed)
History     CSN: [id number]  Arrival date & time 08/20/12  1357   First MD Initiated Contact with Patient 08/20/12 1451      Chief Complaint  Patient presents with  . Cyst    (Consider location/radiation/quality/duration/timing/severity/associated sxs/prior treatment) HPI Comments: 62 year old female nondiabetic. Here for wound check. Patient had incision and drainage of the scalp abscess on April 17. She was admitted on the same day at behavioral health due to a psychotic delusional episode. Apparently she has completed a cycle of antibiotics. And had packing replaced by the inpatient hospital nurse last time on April 25 as per patient and patient's son reports. Denies pain or swelling. Still yellow drainage from the wound.   History reviewed. No pertinent past medical history.  History reviewed. No pertinent past surgical history.  No family history on file.  History  Substance Use Topics  . Smoking status: Never Smoker   . Smokeless tobacco: Not on file  . Alcohol Use: Yes    OB History   Grav Para Term Preterm Abortions TAB SAB Ect Mult Living                  Review of Systems  Constitutional: Negative for fever and chills.  Skin: Positive for wound.  All other systems reviewed and are negative.    Allergies  Review of patient's allergies indicates no known allergies.  Home Medications   Current Outpatient Rx  Name  Route  Sig  Dispense  Refill  . divalproex (DEPAKOTE) 250 MG DR tablet   Oral   Take 1 tablet (250 mg total) by mouth 2 (two) times daily. For mood stabilization.   60 tablet   0   . doxycycline (VIBRAMYCIN) 100 MG capsule   Oral   Take 1 capsule (100 mg total) by mouth 2 (two) times daily.   20 capsule   0   . mupirocin ointment (BACTROBAN) 2 %   Topical   Apply topically 3 (three) times daily.   22 g   0   . risperiDONE (RISPERDAL) 0.5 MG tablet   Oral   Take 3 tablets (1.5 mg total) by mouth at bedtime. For mental clarity and  psychosis.   90 tablet   0     BP 134/67  Pulse 99  Temp(Src) 98.6 F (37 C) (Oral)  Resp 16  SpO2 100%  Physical Exam  Nursing note and vitals reviewed. Constitutional: She appears well-developed and well-nourished. No distress.  Cardiovascular: Normal heart sounds.   Pulmonary/Chest: Breath sounds normal.  Skin: She is not diaphoretic.  There is a 2x2 cm mass in in right pareto-occipital area that has been previously incised and drained. Has tight packing inside cavity. Area is not red or swollen and is minimally tender to palpation. Red blood drained after packing removed. Cavity explored and irrigated. Applied antibiotic ointment and dry dressing. Area was not repacked.   Psychiatric: She has a normal mood and affect. Her behavior is normal. Judgment and thought content normal.    ED Course  Procedures (including critical care time)  Labs Reviewed - No data to display No results found.   1. Infected sebaceous cyst of skin       MDM  Patient here for followup of infected sebaceous cyst that has been previously I&D initially on April 17 and treated with subsequent packing while inpatient at behavioral Hospital. My impression is that patient has had packing too tight and left for several days at a  time without close revision. Not allowing granulation tissue to fill the cavity. I decided to remove the packing today and apply antibiotic ointment and dry dressing. Did not repacked. Prescribed doxycycline and topical mupirocin. Wound care instructions were discussed with patient and provided in writing. She was asked to return if redness, swelling or persistent  drainage. She might require broader surgical resection if failure to resolve. Surgical referral as needed.        Sharin Grave, MD 08/21/12 1037

## 2012-08-31 ENCOUNTER — Ambulatory Visit (HOSPITAL_COMMUNITY): Payer: Self-pay | Admitting: Psychiatry

## 2012-09-15 ENCOUNTER — Encounter (HOSPITAL_COMMUNITY): Payer: Self-pay | Admitting: Psychiatry

## 2012-09-15 ENCOUNTER — Ambulatory Visit (INDEPENDENT_AMBULATORY_CARE_PROVIDER_SITE_OTHER): Payer: Medicare Other | Admitting: Psychiatry

## 2012-09-15 VITALS — BP 122/70 | HR 78 | Ht 64.0 in | Wt 230.0 lb

## 2012-09-15 DIAGNOSIS — F319 Bipolar disorder, unspecified: Secondary | ICD-10-CM

## 2012-09-15 MED ORDER — RISPERIDONE 0.5 MG PO TABS: 1.5000 mg | ORAL_TABLET | Freq: Every day | ORAL | Status: DC

## 2012-09-15 MED ORDER — DIVALPROEX SODIUM 500 MG PO DR TAB: 500.0000 mg | DELAYED_RELEASE_TABLET | Freq: Every day | ORAL | Status: DC
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
## 2012-09-21 ENCOUNTER — Ambulatory Visit (INDEPENDENT_AMBULATORY_CARE_PROVIDER_SITE_OTHER): Payer: Medicare Other | Admitting: Psychiatry

## 2012-09-21 DIAGNOSIS — F209 Schizophrenia, unspecified: Secondary | ICD-10-CM

## 2012-09-21 DIAGNOSIS — F259 Schizoaffective disorder, unspecified: Secondary | ICD-10-CM

## 2012-09-22 ENCOUNTER — Encounter (HOSPITAL_COMMUNITY): Payer: Self-pay | Admitting: Psychiatry

## 2012-09-22 NOTE — Progress Notes (Signed)
Patient ID: Emily Duarte, female   DOB: July 12, 1950, 62 y.o.   MRN: [id number] Presenting Problem Chief Complaint: schizoaffective disorder  What are the main stressors in your life right now, how long? Hallucinations  2   Previous mental health services Have you ever been treated for a mental health problem, when, where, by whom? Yes    Are you currently seeing a therapist or counselor, counselor's name? No   Have you ever had a mental health hospitalization, how many times, length of stay? Yes    Risk factors for Suicide Demographic factors:  Divorced or widowed Current mental status: none Loss factors: none Historical factors: none Risk Reduction factors: Positive social support Clinical factors:  Schizophrenia:   Paranoid or undifferentiated type Cognitive features that contribute to risk: Loss of executive function    SUICIDE RISK:  Minimal: No identifiable suicidal ideation.  Patients presenting with no risk factors but with morbid ruminations; may be classified as minimal risk based on the severity of the depressive symptoms  Medical history Medical treatment and/or problems, explain: No  Do you have any issues with chronic pain?  No    Social/family history Have you been married, how many times?  once  Do you have children?  One son, Emily Duarte   Who lives in your current household? Temporarily lives with son, plans to live independently and return to her apartment  Military history: No   Religious/spiritual involvement:  What religion/faith base are you? deferred  Family of origin (childhood history)  Where were you born? [place] Where did you grow up? [place], returned to [place] as adolescent  Do you have siblings? Yes     Are your parents alive? No   Social supports (personal and professional): son, sister-in-law  Education How many grades have you completed? high school diploma/GED Did you have any problems in school, what type? No     Employment (financial issues) Pt. Worked for the [employer] company for 26 years before retiring in 1993.  Legal history none  Trauma/Abuse history: None reported  Substance use None reported  Mental Status: General Appearance Emily Duarte:  Bizarre Eye Contact:  Fair Motor Behavior:  Normal Speech:  Normal Level of Consciousness:  Alert Mood:  Euthymic Affect:  Appropriate Anxiety Level:  minimal Thought Process:  Tangential and Loose Thought Content:  Delusions Perception:  Hallucinations Judgment:  Poor Insight:  Absent  Diagnosis AXIS I Schizoaffective Disorder  AXIS II No diagnosis  AXIS III No past medical history on file.  AXIS IV other psychosocial or environmental problems  AXIS V 21-30 behavior considerably influenced by delusions or hallucinations OR serious impairment in judgment, communication OR inability to function in almost all areas   Plan: Pt. To return in 1-2 weeks for continued assessment. Pt. Was pleasant and cooperative, continues to be focused on hallucinations related to snake in her home and belief that it has bitten her repeatedly. Pt. Signed consent for me to speak with her son Emily Duarte.  _________________________________________         Boneta Lucks, Ph.D., NCC, [facility]

## 2012-09-29 ENCOUNTER — Ambulatory Visit (HOSPITAL_COMMUNITY): Payer: Self-pay | Admitting: Psychiatry

## 2012-10-02 ENCOUNTER — Ambulatory Visit (HOSPITAL_COMMUNITY): Payer: Self-pay | Admitting: Psychiatry

## 2012-10-07 ENCOUNTER — Ambulatory Visit (HOSPITAL_COMMUNITY): Payer: Self-pay | Admitting: Psychiatry

## 2015-08-02 ENCOUNTER — Ambulatory Visit (HOSPITAL_COMMUNITY)
Admission: RE | Admit: 2015-08-02 | Discharge: 2015-08-02 | Disposition: A | Discharge status: Home / Self Care | Payer: Medicare Other | Attending: Psychiatry | Admitting: Psychiatry

## 2015-08-02 NOTE — BH Assessment (Addendum)
Assessment Note  Emily Duarte is a 66 y.o. female with a history of Schizoaffective Disorder who presented to [facility] as a voluntary walk-in.  She came at the recommendation of her son because she is being bitten by insects at night.  A review of Pt's history showed that Pt has complained before of being bitten by insects and other animals.  Pt explained that in her previous apartment, she was "menaced" by a snake.  Pt reported today that she has a rash on her wrist that may have been caused by an insect coming through space between her front door and the floor.  Pt showed author the rash -- it was a small red welt like a mosquito bite.  Pt explained at length that she was bothered by insects at her old apartment, and she is concerned that she is going to be bothered by insects at her new location.  Pt denied suicidal or homicidal ideation; she denied self-injury; she also denied auditory/visual hallucination.  When asked what help [facility] could provide, Pt indicated that she felt she might need help in getting back on her medication.  Pt said that she has been prescribed Depakote in the past, but is not taking it.  Her prescription is out and needs to be refilled.  During assessment, Pt presented as pleasant.  She was dressed in street clothes and had excessive make-up and accessories.  She reported mood as "fine" and affect was pleasant and preoccupied with the possibility of insects biting her.  She denied depressive symptoms, suicidal ideation, homicidal ideation, auditory/visual hallucination, and self-injury.  Thought processes seemed tangential and thought content indicated the long-standing delusion around insects.  Pt's speech was normal in rate, rhythm, and volume.  Memory and concentration were intact.  Insight, judgment, and impulse control were fair to poor.    Pt's son indicated that he is suspicious mother scratched her chest and leg to replicate insect bites -- Pt did not show author these  scratches during assessment.  Consulted with Catalina Pizza, DNP, who indicated that Pt does not meet inpatient criteria since Pt is not suicidal, homicidal, hallucinating.  Advised son of IVC process.  Diagnosis: Schizoaffective Disorder  Past Medical History: No past medical history on file.  No past surgical history on file.  Family History:  Family History  Problem Relation Age of Onset  . Schizophrenia Sister     Social History:  reports that she has never smoked. She does not have any smokeless tobacco history on file. She reports that she drinks alcohol. She reports that she does not use illicit drugs.  Additional Social History:  Alcohol / Drug Use Pain Medications: See PTA Prescriptions: See PTA -- Pt indicated she is non-compliant with Depakote Over the Counter: See PTA History of alcohol / drug use?: No history of alcohol / drug abuse  CIWA:   COWS:    Allergies: No Known Allergies  Home Medications:  (Not in a hospital admission)  OB/GYN Status:  No LMP recorded. Patient is postmenopausal.  General Assessment Data Location of Assessment: [facility] TTS Assessment: In system Is this a Tele or Face-to-Face Assessment?: Face-to-Face Is this an Initial Assessment or a Re-assessment for this encounter?: Initial Assessment Marital status: Divorced Is patient pregnant?: No Pregnancy Status: No Living Arrangements: Alone Can pt return to current living arrangement?: Yes Admission Status: Voluntary Is patient capable of signing voluntary admission?: Yes Referral Source: Self/Family/Friend Insurance type: Medicare  Medical Screening Exam ([place])  Medical Exam completed: No Reason for MSE not completed: Patient Refused  Crisis Care Plan Living Arrangements: Alone  Education Status Is patient currently in school?: No  Risk to self with the past 6 months Suicidal Ideation: No Has patient been a risk to self within the past 6 months  prior to admission? : No Suicidal Intent: No Has patient had any suicidal intent within the past 6 months prior to admission? : No Is patient at risk for suicide?: No Suicidal Plan?: No Has patient had any suicidal plan within the past 6 months prior to admission? : No Access to Means: No Previous Attempts/Gestures: No Other Self Harm Risks: Non-compliant with meds Intentional Self Injurious Behavior: None (Pt denied; son indicated that Pt scratched chest/leg) Family Suicide History: No Persecutory voices/beliefs?: No Depression: No Substance abuse history and/or treatment for substance abuse?: No Suicide prevention information given to non-admitted patients: Not applicable  Risk to Others within the past 6 months Homicidal Ideation: No Does patient have any lifetime risk of violence toward others beyond the six months prior to admission? : No Thoughts of Harm to Others: No Current Homicidal Intent: No Current Homicidal Plan: No Access to Homicidal Means: No History of harm to others?: No Assessment of Violence: None Noted Does patient have access to weapons?: No Criminal Charges Pending?: No Does patient have a court date: No Is patient on probation?: No  Psychosis Hallucinations: None noted Delusions: Somatic (Pt believes animals biting her at night; "menaced" by snake)  Mental Status Report Appearance/Hygiene: Excess makeup, Excess accessories Eye Contact: Good Motor Activity: Unremarkable Speech: Tangential Level of Consciousness: Alert Mood: Pleasant Affect: Preoccupied Anxiety Level: None Thought Processes: Tangential Judgement: Partial Orientation: Time, Place, Person Obsessive Compulsive Thoughts/Behaviors: None  Cognitive Functioning Concentration: Fair Memory: Recent Intact, Remote Intact IQ: Average Insight: Fair Impulse Control: Fair Appetite: Good Sleep: No Change Vegetative Symptoms: None  ADLScreening [facility] Assessment Services) Patient's  cognitive ability adequate to safely complete daily activities?: Yes Patient able to express need for assistance with ADLs?: Yes Independently performs ADLs?: Yes (appropriate for developmental age)  Prior Inpatient Therapy Prior Inpatient Therapy: Yes Prior Therapy Dates: 2004, 2005, 2014 Prior Therapy Facilty/Provider(s): [facility] Reason for Treatment: Schizoaffective  Prior Outpatient Therapy Prior Outpatient Therapy: No Does patient have an ACCT team?: No Does patient have Intensive In-House Services?  : No Does patient have [facility] services? : No Does patient have P4CC services?: No  ADL Screening (condition at time of admission) Patient's cognitive ability adequate to safely complete daily activities?: Yes Is the patient deaf or have difficulty hearing?: No Does the patient have difficulty seeing, even when wearing glasses/contacts?: No Does the patient have difficulty concentrating, remembering, or making decisions?: No Patient able to express need for assistance with ADLs?: Yes Does the patient have difficulty dressing or bathing?: No Independently performs ADLs?: Yes (appropriate for developmental age) Does the patient have difficulty walking or climbing stairs?: No Weakness of Legs: None Weakness of Arms/Hands: None       Abuse/Neglect Assessment (Assessment to be complete while patient is alone) Physical Abuse: Denies Verbal Abuse: Denies Sexual Abuse: Denies Exploitation of patient/patient's resources: Denies Self-Neglect: Denies Values / Beliefs Cultural Requests During Hospitalization: None Spiritual Requests During Hospitalization: None Consults Spiritual Care Consult Needed: No Social Work Consult Needed: No Regulatory affairs officer (For Healthcare) Does patient have an advance directive?: No Would patient like information on creating an advanced directive?: No - patient declined information    Additional Information 1:1 In Past 12  Months?: No CIRT Risk:  No Elopement Risk: No Does patient have medical clearance?: No     Disposition:  Disposition Initial Assessment Completed for this Encounter: Yes Disposition of Patient: Other dispositions Other disposition(s): Other (Comment) (Per Catalina Pizza, DNP, Pt does not meet inpt criteria)  On Site Evaluation by:   Reviewed with Physician:    Marlowe Aschoff 08/02/2015 6:53 PM

## 2015-09-27 ENCOUNTER — Ambulatory Visit (HOSPITAL_COMMUNITY): Payer: Self-pay | Admitting: Psychiatry

## 2016-01-11 ENCOUNTER — Encounter: Payer: Self-pay | Admitting: Internal Medicine

## 2016-08-01 ENCOUNTER — Encounter (HOSPITAL_COMMUNITY): Payer: Self-pay | Admitting: *Deleted

## 2016-08-01 ENCOUNTER — Inpatient Hospital Stay (HOSPITAL_COMMUNITY)
Admission: AD | Admit: 2016-08-01 | Discharge: 2016-08-01 | Disposition: A | Discharge status: Home / Self Care | Payer: Medicare Other | Source: Ambulatory Visit | Attending: Obstetrics and Gynecology | Admitting: Obstetrics and Gynecology

## 2016-08-01 DIAGNOSIS — N950 Postmenopausal bleeding: Secondary | ICD-10-CM | POA: Diagnosis not present

## 2016-08-01 DIAGNOSIS — N841 Polyp of cervix uteri: Secondary | ICD-10-CM | POA: Diagnosis not present

## 2016-08-01 DIAGNOSIS — Z780 Asymptomatic menopausal state: Secondary | ICD-10-CM | POA: Insufficient documentation

## 2016-08-01 DIAGNOSIS — N939 Abnormal uterine and vaginal bleeding, unspecified: Secondary | ICD-10-CM | POA: Diagnosis present

## 2016-08-01 HISTORY — DX: Essential (primary) hypertension: I10

## 2016-08-01 HISTORY — DX: Bitten by nonvenomous snake, initial encounter: W59.11XA

## 2016-08-01 HISTORY — DX: Unspecified hemorrhoids: K64.9

## 2016-08-01 LAB — URINALYSIS, ROUTINE W REFLEX MICROSCOPIC

## 2016-08-01 LAB — URINALYSIS, MICROSCOPIC (REFLEX)

## 2016-08-01 NOTE — MAU Note (Signed)
c/o intermittent vaginal bleeding for past 10 days; denies any pain or itching; denies any vaginal burning; denies sexual acitivity;

## 2016-08-01 NOTE — Discharge Instructions (Signed)
Abnormal Uterine Bleeding Abnormal uterine bleeding can affect women at various stages in life, including teenagers, women in their reproductive years, pregnant women, and women who have reached menopause. Several kinds of uterine bleeding are considered abnormal, including:  Bleeding or spotting between periods.  Bleeding after sexual intercourse.  Bleeding that is heavier or more than normal.  Periods that last longer than usual.  Bleeding after menopause. Many cases of abnormal uterine bleeding are minor and simple to treat, while others are more serious. Any type of abnormal bleeding should be evaluated by your health care provider. Treatment will depend on the cause of the bleeding. Follow these instructions at home: Monitor your condition for any changes. The following actions may help to alleviate any discomfort you are experiencing:  Avoid the use of tampons and douches as directed by your health care provider.  Change your pads frequently. You should get regular pelvic exams and Pap tests. Keep all follow-up appointments for diagnostic tests as directed by your health care provider. Contact a health care provider if:  Your bleeding lasts more than 1 week.  You feel dizzy at times. Get help right away if:  You pass out.  You are changing pads every 15 to 30 minutes.  You have abdominal pain.  You have a fever.  You become sweaty or weak.  You are passing large blood clots from the vagina.  You start to feel nauseous and vomit. This information is not intended to replace advice given to you by your health care provider. Make sure you discuss any questions you have with your health care provider. Document Released: 04/08/2005 Document Revised: 09/20/2015 Document Reviewed: 11/05/2012 Elsevier Interactive Patient Education  2017 Elsevier Inc.  

## 2016-08-01 NOTE — MAU Provider Note (Signed)
History     CSN: [id number]  Arrival date and time: 08/01/16 1121   First Provider Initiated Contact with Patient 08/01/16 1222      Chief Complaint  Patient presents with  . Vaginal Bleeding   66 y.o. postmenopausal female here with VB x10 days. She reports seeing some blood from the vaginal area. She cannot quantify the amt. Bleeding has been intermittent. She used a cream to the vaginal area that was previously given to her for a snake bite. She feels the bleeding is caused by a walking trip she took back in December which was 2 blocks, and this was excessive distance for her. She is not sexually active and hasn't had anything in the vagina. She completed menopause 20 years ago. Hx of SVD x1 and BTL.    Past Medical History:  Diagnosis Date  . Hemorrhoid   . Hypertension   . Snake bite     Past Surgical History:  Procedure Laterality Date  . TUBAL LIGATION      Family History  Problem Relation Age of Onset  . Schizophrenia Sister     Social History  Substance Use Topics  . Smoking status: Never Smoker  . Smokeless tobacco: Never Used  . Alcohol use Yes     Comment: has an [place] mash every day    Allergies: No Known Allergies  No prescriptions prior to admission.    Review of Systems  Constitutional: Negative for fever.  Gastrointestinal: Negative for abdominal pain.  Genitourinary: Positive for vaginal bleeding. Negative for pelvic pain.   Physical Exam   Blood pressure 121/63, pulse (!) 117, temperature 99.3 F (37.4 C), temperature source Oral, resp. rate 18.  Physical Exam  Constitutional: She is oriented to person, place, and time. She appears well-developed and well-nourished. No distress.  HENT:  Head: Normocephalic and atraumatic.  Neck: Normal range of motion.  Respiratory: Effort normal. No respiratory distress.  Genitourinary:  Genitourinary Comments: External: no lesions or erythema to vulva Vagina: smooth, parous, scant bloody discharge,  small 1 cm mass protruding from os, easily bled with fox swab contact    Musculoskeletal: Normal range of motion.  Neurological: She is alert and oriented to person, place, and time.  Skin: Skin is warm and dry.  Psychiatric: She has a normal mood and affect.   Results for orders placed or performed during the hospital encounter of 08/01/16 (from the past 24 hour(s))  Urinalysis, Routine w reflex microscopic     Status: Abnormal   Collection Time: 08/01/16 11:50 AM  Result Value Ref Range   Color, Urine RED (A) YELLOW   APPearance CLOUDY (A) CLEAR   Specific Gravity, Urine  1.005 - 1.030    TEST NOT REPORTED DUE TO COLOR INTERFERENCE OF URINE PIGMENT   pH  5.0 - 8.0    TEST NOT REPORTED DUE TO COLOR INTERFERENCE OF URINE PIGMENT   Glucose, UA (A) NEGATIVE mg/dL    TEST NOT REPORTED DUE TO COLOR INTERFERENCE OF URINE PIGMENT   Hgb urine dipstick (A) NEGATIVE    TEST NOT REPORTED DUE TO COLOR INTERFERENCE OF URINE PIGMENT   Bilirubin Urine (A) NEGATIVE    TEST NOT REPORTED DUE TO COLOR INTERFERENCE OF URINE PIGMENT   Ketones, ur (A) NEGATIVE mg/dL    TEST NOT REPORTED DUE TO COLOR INTERFERENCE OF URINE PIGMENT   Protein, ur (A) NEGATIVE mg/dL    TEST NOT REPORTED DUE TO COLOR INTERFERENCE OF URINE PIGMENT   Nitrite (A) NEGATIVE  TEST NOT REPORTED DUE TO COLOR INTERFERENCE OF URINE PIGMENT   Leukocytes, UA (A) NEGATIVE    TEST NOT REPORTED DUE TO COLOR INTERFERENCE OF URINE PIGMENT  Urinalysis, Microscopic (reflex)     Status: Abnormal   Collection Time: 08/01/16 11:50 AM  Result Value Ref Range   RBC / HPF TOO NUMEROUS TO COUNT 0 - 5 RBC/hpf   WBC, UA TOO NUMEROUS TO COUNT 0 - 5 WBC/hpf   Bacteria, UA MANY (A) NONE SEEN   Squamous Epithelial / LPF 0-5 (A) NONE SEEN    MAU Course  Procedures  MDM Labs ordered and reviewwed. Consult with Dr. Rip Harbour. Exam re-performed by Dr. Rip Harbour. Monsels solution applied to cervical polyp. No further bleeding. Will arrange outpt [place] and f/u  in clinic for possible EBX. Stable for discharge home.  Assessment and Plan   1. Postmenopausal bleeding   2. Cervical polyp    Discharge home Outpatient [place] in 1 week Follow up in [place] after [place]  Allergies as of 08/01/2016   No Known Allergies     Medication List    STOP taking these medications   divalproex 500 MG DR tablet Commonly known as:  Letitia Caul, CNM 08/01/2016, 2:46 PM

## 2016-08-02 ENCOUNTER — Encounter (HOSPITAL_COMMUNITY): Payer: Self-pay | Admitting: Emergency Medicine

## 2016-08-02 ENCOUNTER — Emergency Department (HOSPITAL_COMMUNITY)
Admission: EM | Admit: 2016-08-02 | Discharge: 2016-08-06 | Disposition: A | Discharge status: Home / Self Care | Payer: Medicare Other | Attending: Emergency Medicine | Admitting: Emergency Medicine

## 2016-08-02 DIAGNOSIS — F3163 Bipolar disorder, current episode mixed, severe, without psychotic features: Secondary | ICD-10-CM | POA: Diagnosis present

## 2016-08-02 DIAGNOSIS — Z5181 Encounter for therapeutic drug level monitoring: Secondary | ICD-10-CM | POA: Diagnosis not present

## 2016-08-02 DIAGNOSIS — F325 Major depressive disorder, single episode, in full remission: Secondary | ICD-10-CM | POA: Diagnosis not present

## 2016-08-02 DIAGNOSIS — I10 Essential (primary) hypertension: Secondary | ICD-10-CM | POA: Diagnosis not present

## 2016-08-02 DIAGNOSIS — Z79899 Other long term (current) drug therapy: Secondary | ICD-10-CM | POA: Diagnosis not present

## 2016-08-02 DIAGNOSIS — R05 Cough: Secondary | ICD-10-CM

## 2016-08-02 DIAGNOSIS — Z046 Encounter for general psychiatric examination, requested by authority: Secondary | ICD-10-CM | POA: Diagnosis present

## 2016-08-02 DIAGNOSIS — R059 Cough, unspecified: Secondary | ICD-10-CM

## 2016-08-02 DIAGNOSIS — M7989 Other specified soft tissue disorders: Secondary | ICD-10-CM | POA: Diagnosis not present

## 2016-08-02 DIAGNOSIS — Z818 Family history of other mental and behavioral disorders: Secondary | ICD-10-CM | POA: Diagnosis not present

## 2016-08-02 LAB — CBC
HCT: 36.2 % (ref 36.0–46.0)
HEMOGLOBIN: 11.7 g/dL — AB (ref 12.0–15.0)
MCH: 25.8 pg — ABNORMAL LOW (ref 26.0–34.0)
MCHC: 32.3 g/dL (ref 30.0–36.0)
MCV: 79.9 fL (ref 78.0–100.0)
Platelets: 686 10*3/uL — ABNORMAL HIGH (ref 150–400)
RBC: 4.53 MIL/uL (ref 3.87–5.11)
RDW: 15.2 % (ref 11.5–15.5)
WBC: 12.8 10*3/uL — AB (ref 4.0–10.5)

## 2016-08-02 LAB — COMPREHENSIVE METABOLIC PANEL
ALT: 12 U/L — ABNORMAL LOW (ref 14–54)
AST: 23 U/L (ref 15–41)
Albumin: 3.6 g/dL (ref 3.5–5.0)
Alkaline Phosphatase: 89 U/L (ref 38–126)
Anion gap: 13 (ref 5–15)
BUN: 15 mg/dL (ref 6–20)
CHLORIDE: 96 mmol/L — AB (ref 101–111)
CO2: 26 mmol/L (ref 22–32)
Calcium: 9.4 mg/dL (ref 8.9–10.3)
Creatinine, Ser: 1.14 mg/dL — ABNORMAL HIGH (ref 0.44–1.00)
GFR calc Af Amer: 56 mL/min — ABNORMAL LOW (ref >60)
GFR, EST NON AFRICAN AMERICAN: 49 mL/min — AB (ref >60)
Glucose, Bld: 131 mg/dL — ABNORMAL HIGH (ref 65–99)
POTASSIUM: 3.6 mmol/L (ref 3.5–5.1)
SODIUM: 135 mmol/L (ref 135–145)
Total Bilirubin: 0.5 mg/dL (ref 0.3–1.2)
Total Protein: 9 g/dL — ABNORMAL HIGH (ref 6.5–8.1)

## 2016-08-02 LAB — ACETAMINOPHEN LEVEL: Acetaminophen (Tylenol), Serum: <10 ug/mL — ABNORMAL LOW (ref 10–30)

## 2016-08-02 LAB — RAPID URINE DRUG SCREEN, HOSP PERFORMED
AMPHETAMINES: NOT DETECTED
BENZODIAZEPINES: NOT DETECTED
Barbiturates: NOT DETECTED
Cocaine: NOT DETECTED
OPIATES: NOT DETECTED
TETRAHYDROCANNABINOL: NOT DETECTED

## 2016-08-02 LAB — ETHANOL

## 2016-08-02 LAB — SALICYLATE LEVEL

## 2016-08-02 NOTE — ED Triage Notes (Signed)
Pt verbalizes "have been bumping into things" and "I know it has something to do with the weather." Pt continues to verbalizes "I was told to go and walked 2 minutes because the weather was nice." Pt has flight of ideas; this Probation officer cannot assess medical complaint related to such. Pt denies A/VH or SI/HI.

## 2016-08-02 NOTE — ED Notes (Signed)
Patient awake, resting in bed and watching tv. Patient confused on assessment and guarded. Pt having difficulty expressing herself. Respirations regular and unlabored.

## 2016-08-02 NOTE — ED Notes (Signed)
Bed: [facility] Expected date:  Expected time:  Means of arrival:  Comments: 

## 2016-08-02 NOTE — ED Provider Notes (Signed)
[place] [facility] DEPT Provider Note   CSN: [id number] Arrival date & time: 08/02/16  1200     History   Chief Complaint Chief Complaint  Patient presents with  . Medical Clearance    HPI Emily Duarte is a 66 y.o. female.  HPI Pt has a hx of bipolar disorder and brought to the ER for evaluation of psychiatric illness. Flight of ideas during hx. Reports "there is no Bess Kinds on the TV during the winter". Also reports "that she needs to stay very quiet and can't have very bright colors on the walls".  Reports not going out into public much lately. Reports compliance with her risperdal and depakote. See Dr Ronnald Ramp, psychiatry at [facility].    Past Medical History:  Diagnosis Date  . Hemorrhoid   . Hypertension   . Snake bite     Patient Active Problem List   Diagnosis Date Noted  . Bipolar 1 disorder, mixed, severe ([place]) 08/07/2012  . Cyst of soft tissue 08/07/2012    Past Surgical History:  Procedure Laterality Date  . TUBAL LIGATION      OB History    Gravida Para Term Preterm AB Living   1 1       1    SAB TAB Ectopic Multiple Live Births           1       Home Medications    Prior to Admission medications   Not on File    Family History Family History  Problem Relation Age of Onset  . Schizophrenia Sister     Social History Social History  Substance Use Topics  . Smoking status: Never Smoker  . Smokeless tobacco: Never Used  . Alcohol use Yes     Comment: has an [place] mash every day     Allergies   Patient has no known allergies.   Review of Systems Review of Systems  Unable to perform ROS: Psychiatric disorder     Physical Exam Updated Vital Signs BP (!) 151/93 (BP Location: Left Arm)   Pulse (!) 117   Temp 98.3 F (36.8 C) (Oral)   Resp 18   SpO2 95%   Physical Exam  Constitutional: She is oriented to person, place, and time. She appears well-developed and well-nourished. No distress.  HENT:  Head: Normocephalic and  atraumatic.  Eyes: EOM are normal.  Neck: Normal range of motion.  Cardiovascular: Normal rate, regular rhythm and normal heart sounds.   Pulmonary/Chest: Effort normal and breath sounds normal.  Abdominal: Soft. She exhibits no distension. There is no tenderness.  Musculoskeletal: Normal range of motion.  Neurological: She is alert and oriented to person, place, and time.  Skin: Skin is warm and dry.  Psychiatric: Her mood appears anxious. Her speech is rapid and/or pressured and tangential. She expresses no homicidal and no suicidal ideation.  Nursing note and vitals reviewed.    ED Treatments / Results  Labs (all labs ordered are listed, but only abnormal results are displayed) Labs Reviewed  COMPREHENSIVE METABOLIC PANEL - Abnormal; Notable for the following:       Result Value   Chloride 96 (*)    Glucose, Bld 131 (*)    Creatinine, Ser 1.14 (*)    Total Protein 9.0 (*)    ALT 12 (*)    GFR calc non Af Amer 49 (*)    GFR calc Af Amer 56 (*)    All other components within normal limits  ACETAMINOPHEN LEVEL -  Abnormal; Notable for the following:    Acetaminophen (Tylenol), Serum <10 (*)    All other components within normal limits  CBC - Abnormal; Notable for the following:    WBC 12.8 (*)    Hemoglobin 11.7 (*)    MCH 25.8 (*)    Platelets 686 (*)    All other components within normal limits  ETHANOL  SALICYLATE LEVEL  RAPID URINE DRUG SCREEN, HOSP PERFORMED    EKG  EKG Interpretation None       Radiology No results found.  Procedures Procedures (including critical care time)  Medications Ordered in ED Medications - No data to display   Initial Impression / Assessment and Plan / ED Course  I have reviewed the triage vital signs and the nursing notes.  Pertinent labs & imaging results that were available during my care of the patient were reviewed by me and considered in my medical decision making (see chart for details).    Medically clear. Will  need psychiatric evaluation  Final Clinical Impressions(s) / ED Diagnoses   Final diagnoses:  None    New Prescriptions New Prescriptions   No medications on file     Jola Schmidt, MD 08/02/16 1528

## 2016-08-02 NOTE — BH Assessment (Signed)
Tele Assessment Note   Emily Duarte is an 66 y.o. female presenting to the ED with increased paranoia, delusions, flight of ideas, poor eye contact, anxious mood, impaired judgement and insight. Patient was focused on her lack of bowel movements over the past several days. It is unclear to this clinician if the patient has a true medical issue. The patient would rapidly shift from one thought to the next: referenced drinking wild irish rose daily, watching Emily Duarte and snakes following her in quick succession.   The patient denies SI, HI or A/V. 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 She has visual hallucinations of a snake. She believes the snake followed her to her last apartment and got in the apartment. The patient lives alone. Her son, Emily Duarte, moved her to another apartment where she now believes the snake is outside and not inside her apartment. The patient admittedly does not see a psychiatrist or therapist. She states, "I don't need it." The patient believes she needs rest and a quiet place. Her son reports she is compliant with medication when in the hospital but will not take medication on her own.   The patient admits to drinking wine daily. Its is unclear how much she actually drinks. The patient's son is not sure how much she drinks but states he thinks one bottle per week, but sometimes more. The patient was last at [facility] psychiatric inpatient about a year ago and has multiple admissions in the past.   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 Epic records indicated increased paranoia over the last several years and a decrease in functioning level. Her son reports she will do well for a while but every 2 or 3 months she has rambling speech and flight of ideas. States its gotten worse over the last few days. His mother asked to come to the hospital.   [name] recommends inpatient.     Diagnosis: Paranoid Schizophrenia  Past Medical History:  Past Medical History:  Diagnosis Date  . Hemorrhoid   . Hypertension   . Snake bite     Past Surgical History:  Procedure Laterality Date  . TUBAL LIGATION      Family History:  Family History  Problem Relation Age of Onset  . Schizophrenia Sister     Social History:  reports that she has never smoked. She has never used smokeless tobacco. She reports that she drinks alcohol. She reports that she does not use drugs.  Additional Social History:  Alcohol / Drug Use Pain Medications: see MAR Prescriptions: see MAR Over the Counter: see MAR History of alcohol / drug use?: Yes Substance #1 Name of Substance 1: alcohol 1 - Amount (size/oz): unknown- drinks wild irish rose 1 - Frequency: daily 1 - Duration: 20 yrs 1 - Last Use / Amount: last night  CIWA: CIWA-Ar BP: (!) 151/93 Pulse Rate: (!) 117 COWS:    PATIENT STRENGTHS: (choose at least two) Average or above average intelligence General fund of knowledge  Allergies: No Known Allergies  Home Medications:  (Not in a hospital admission)  OB/GYN Status:  No LMP recorded. Patient is postmenopausal.  General Assessment Data Location of Assessment: [facility] TTS Assessment: In system Is this a Tele or Face-to-Face Assessment?: Tele Assessment Is this an Initial Assessment or a Re-assessment for this encounter?: Initial Assessment Marital status: Single Is patient pregnant?: No  Pregnancy Status: No Living Arrangements: Alone Can pt return to current living arrangement?: Yes Admission Status: Voluntary Is patient capable of signing voluntary admission?: Yes Referral Source: Self/Family/Friend Insurance type: MCR  Medical Screening Exam ([place]) Medical Exam completed: Yes  Crisis Care Plan Living Arrangements: Alone Name of Psychiatrist: n/a Name of Therapist: n/a  Education Status Is patient currently  in school?: No Highest grade of school patient has completed: some master's courses  Risk to self with the past 6 months Suicidal Ideation: No Has patient been a risk to self within the past 6 months prior to admission? : No Suicidal Intent: No Has patient had any suicidal intent within the past 6 months prior to admission? : No Is patient at risk for suicide?: No Suicidal Plan?: No Has patient had any suicidal plan within the past 6 months prior to admission? : No Access to Means: No What has been your use of drugs/alcohol within the last 12 months?: daily alcohol use Previous Attempts/Gestures: No How many times?: 0 Intentional Self Injurious Behavior: None Family Suicide History: Unknown Persecutory voices/beliefs?: No Depression: No Substance abuse history and/or treatment for substance abuse?: Yes Suicide prevention information given to non-admitted patients: Not applicable  Risk to Others within the past 6 months Homicidal Ideation: No Does patient have any lifetime risk of violence toward others beyond the six months prior to admission? : No Thoughts of Harm to Others: No Current Homicidal Intent: No Current Homicidal Plan: No Access to Homicidal Means: No History of harm to others?: No Does patient have access to weapons?: No Criminal Charges Pending?: No Does patient have a court date: No Is patient on probation?: No  Psychosis Hallucinations: Visual Delusions: Grandiose  Mental Status Report Appearance/Hygiene: Unremarkable Eye Contact: Poor Motor Activity: Freedom of movement Speech: Rapid, Tangential Level of Consciousness: Alert Mood: Anxious Affect: Blunted Anxiety Level: Moderate Thought Processes: Flight of Ideas Judgement: Impaired Orientation: Person, Place, Situation Obsessive Compulsive Thoughts/Behaviors: Unable to Assess  Cognitive Functioning Concentration: Decreased Memory: Recent Intact, Remote Intact IQ: Average Insight: Poor Impulse  Control: Poor Appetite: Fair Sleep: No Change  ADLScreening [facility] Assessment Services) Patient's cognitive ability adequate to safely complete daily activities?: Yes Patient able to express need for assistance with ADLs?: Yes Independently performs ADLs?: Yes (appropriate for developmental age)  Prior Inpatient Therapy Prior Inpatient Therapy: Yes Prior Therapy Dates: 2017, 2016 Prior Therapy Facilty/Provider(s): [facility], [facility] Reason for Treatment: Paranoid Schizophrenia  Prior Outpatient Therapy Prior Outpatient Therapy: No Does patient have an ACCT team?: No Does patient have Intensive In-House Services?  : No Does patient have [facility] services? : No Does patient have P4CC services?: No  ADL Screening (condition at time of admission) Patient's cognitive ability adequate to safely complete daily activities?: Yes Is the patient deaf or have difficulty hearing?: No Does the patient have difficulty seeing, even when wearing glasses/contacts?: No Does the patient have difficulty concentrating, remembering, or making decisions?: No Patient able to express need for assistance with ADLs?: Yes Does the patient have difficulty dressing or bathing?: No Independently performs ADLs?: Yes (appropriate for developmental age)       Abuse/Neglect Assessment (Assessment to be complete while patient is alone) Physical Abuse: Denies Verbal Abuse: Denies Sexual Abuse: Denies     Regulatory affairs officer (For Healthcare) Does Patient Have a Medical Advance Directive?: No    Additional Information 1:1 In Past 12 Months?: No CIRT Risk: No Elopement Risk: No Does patient have medical clearance?: No     Disposition:  Disposition Initial Assessment Completed for this Encounter: Yes Disposition of Patient: Inpatient treatment program Type of inpatient treatment program: Adult  [facility] 08/02/2016 6:27 PM

## 2016-08-03 DIAGNOSIS — Z818 Family history of other mental and behavioral disorders: Secondary | ICD-10-CM | POA: Diagnosis not present

## 2016-08-03 DIAGNOSIS — Z79899 Other long term (current) drug therapy: Secondary | ICD-10-CM

## 2016-08-03 DIAGNOSIS — F3163 Bipolar disorder, current episode mixed, severe, without psychotic features: Secondary | ICD-10-CM | POA: Diagnosis not present

## 2016-08-03 DIAGNOSIS — M7989 Other specified soft tissue disorders: Secondary | ICD-10-CM

## 2016-08-03 MED ORDER — RISPERIDONE 1 MG PO TABS
1.0000 mg | ORAL_TABLET | Freq: Every day | ORAL | Status: DC
  Administered 2016-08-03 – 2016-08-04 (×2): 1 mg via ORAL
  Filled 2016-08-03 (×2): qty 1

## 2016-08-03 MED ORDER — DIVALPROEX SODIUM ER 500 MG PO TB24
500.0000 mg | ORAL_TABLET | Freq: Every day | ORAL | Status: DC
  Administered 2016-08-03: 500 mg via ORAL
  Filled 2016-08-03: qty 1

## 2016-08-03 NOTE — ED Notes (Signed)
Hourly rounding reveals patient sleeping in room. No complaints, stable, in no acute distress. Q15 minute rounds and monitoring via Security Cameras to continue. 

## 2016-08-03 NOTE — Consult Note (Signed)
[facility] Consult   Reason for Consult:  Psychiatric Evaluation Referring Physician:  EDP Patient Identification: Emily Duarte MRN:  [id number] Principal Diagnosis: Bipolar 1 disorder, mixed, severe ([place]) Diagnosis:   Patient Active Problem List   Diagnosis Date Noted  . Bipolar 1 disorder, mixed, severe ([place]) [F31.63] 08/07/2012  . Cyst of soft tissue [M79.89] 08/07/2012    Total Time spent with patient: 1 hour  Subjective:   Emily Duarte is a 66 y.o. female patient who states "I arrived at the decision that I needed to be in hospital."   HPI:  Per behavioral health therapeutic triage assessment, Emily Duarte is an 66 y.o. female presenting to the ED with increased paranoia, delusions, flight of ideas, poor eye contact, anxious mood, impaired judgement and insight. Patient was focused on her lack of bowel movements over the past several days. It is unclear to this clinician if the patient has a true medical issue. The patient would rapidly shift from one thought to the next: referenced drinking wild irish rose daily, watching Bess Kinds and snakes following her in quick succession. The patient denies SI, HI or A/V. 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 She has visual hallucinations of a snake. She believes the snake followed her to her last apartment and got in the apartment. The patient lives alone. Her son, [name], moved her to another apartment where she now believes the snake is outside and not inside her apartment. The patient admittedly does not see a psychiatrist or therapist. She states, "I don't need it." The patient believes she needs rest and a quiet place. Her son reports she is compliant with medication when in the hospital but will not take medication on her own. The patient admits to drinking wine daily. Its is unclear how much she actually drinks. The patient's son is not sure how much she  drinks but states he thinks one bottle per week, but sometimes more. The patient was last at [facility] psychiatric inpatient about a year ago and has multiple admissions in the past. 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 Epic records indicated increased paranoia over the last several years and a decrease in functioning level. Her son reports she will do well for a while but every 2 or 3 months she has rambling speech and flight of ideas. States its gotten worse over the last few days. His mother asked to come to the hospital.   [facility] Evaluation: Chart and nursing notes reviewed. Pt is seen face-to-face today with Dr. Modesta Messing. Pt states "I went to the grocery store for cabbage but I didn't make cole slaw. I got constipated and got a strawberry-sized hemorrhoid. I take [employer] to make a [employer]." She states the [employer] "downshifts me" and the grocery store was out of stock for 3 months and she tried the white and that didn't help her. She reports prior inpatient hospitalizations at [facility] "[facility]" and at [facility] in 2017 "because a snake was following me."  A review of her medical record indicates the patient was seen She denies paranoia. She reports a past history of bipolar disorder and has been on depakote and risperdal. She states she only takes depakote and risperdal "when the [employer] help." States she restarted Depakote 3 days ago. She is able to accurately state the month, year, and  president. Denies previous history of suicide attempts. Denies suicidal and homicidal ideation, intent and plan. Denies access to weapons.   Past Psychiatric History: Bipolar disorder  Risk to Self: Suicidal Ideation: No Suicidal Intent: No Is patient at risk for suicide?: No Suicidal Plan?: No Access to Means: No What has been your use of drugs/alcohol within the last 12 months?: daily alcohol use How  many times?: 0 Intentional Self Injurious Behavior: None Risk to Others: Homicidal Ideation: No Thoughts of Harm to Others: No Current Homicidal Intent: No Current Homicidal Plan: No Access to Homicidal Means: No History of harm to others?: No Does patient have access to weapons?: No Criminal Charges Pending?: No Does patient have a court date: No Prior Inpatient Therapy: Prior Inpatient Therapy: Yes Prior Therapy Dates: 2017, 2016 Prior Therapy Facilty/Provider(s): [facility], [facility] Reason for Treatment: Paranoid Schizophrenia Prior Outpatient Therapy: Prior Outpatient Therapy: No Does patient have an ACCT team?: No Does patient have Intensive In-House Services?  : No Does patient have [facility] services? : No Does patient have P4CC services?: No  Past Medical History:  Past Medical History:  Diagnosis Date  . Hemorrhoid   . Hypertension   . Snake bite     Past Surgical History:  Procedure Laterality Date  . TUBAL LIGATION     Family History:  Family History  Problem Relation Age of Onset  . Schizophrenia Sister    Family Psychiatric  History: unknown Social History:  History  Alcohol Use  . Yes    Comment: has an [place] mash every day     History  Drug Use No    Social History   Social History  . Marital status: Married    Spouse name: N/A  . Number of children: N/A  . Years of education: N/A   Social History Main Topics  . Smoking status: Never Smoker  . Smokeless tobacco: Never Used  . Alcohol use Yes     Comment: has an [place] mash every day  . Drug use: No  . Sexual activity: No   Other Topics Concern  . None   Social History Narrative  . None   Additional Social History:    Allergies:  No Known Allergies  Labs:  Results for orders placed or performed during the hospital encounter of 08/02/16 (from the past 48 hour(s))  Rapid urine drug screen (hospital performed)     Status: None   Collection Time: 08/02/16 12:44 PM  Result Value Ref  Range   Opiates NONE DETECTED NONE DETECTED   Cocaine NONE DETECTED NONE DETECTED   Benzodiazepines NONE DETECTED NONE DETECTED   Amphetamines NONE DETECTED NONE DETECTED   Tetrahydrocannabinol NONE DETECTED NONE DETECTED   Barbiturates NONE DETECTED NONE DETECTED    Comment:        DRUG SCREEN FOR MEDICAL PURPOSES ONLY.  IF CONFIRMATION IS NEEDED FOR ANY PURPOSE, NOTIFY LAB WITHIN 5 DAYS.        LOWEST DETECTABLE LIMITS FOR URINE DRUG SCREEN Drug Class       Cutoff (ng/mL) Amphetamine      1000 Barbiturate      200 Benzodiazepine   790 Tricyclics       240 Opiates          300 Cocaine          300 THC              50   Comprehensive metabolic panel     Status: Abnormal   Collection  Time: 08/02/16 12:51 PM  Result Value Ref Range   Sodium 135 135 - 145 mmol/L   Potassium 3.6 3.5 - 5.1 mmol/L   Chloride 96 (L) 101 - 111 mmol/L   CO2 26 22 - 32 mmol/L   Glucose, Bld 131 (H) 65 - 99 mg/dL   BUN 15 6 - 20 mg/dL   Creatinine, Ser 1.14 (H) 0.44 - 1.00 mg/dL   Calcium 9.4 8.9 - 10.3 mg/dL   Total Protein 9.0 (H) 6.5 - 8.1 g/dL   Albumin 3.6 3.5 - 5.0 g/dL   AST 23 15 - 41 U/L   ALT 12 (L) 14 - 54 U/L   Alkaline Phosphatase 89 38 - 126 U/L   Total Bilirubin 0.5 0.3 - 1.2 mg/dL   GFR calc non Af Amer 49 (L) >60 mL/min   GFR calc Af Amer 56 (L) >60 mL/min    Comment: (NOTE) The eGFR has been calculated using the CKD EPI equation. This calculation has not been validated in all clinical situations. eGFR's persistently <60 mL/min signify possible Chronic Kidney Disease.    Anion gap 13 5 - 15  Ethanol     Status: None   Collection Time: 08/02/16 12:51 PM  Result Value Ref Range   Alcohol, Ethyl (B) <5 <5 mg/dL    Comment:        LOWEST DETECTABLE LIMIT FOR SERUM ALCOHOL IS 5 mg/dL FOR MEDICAL PURPOSES ONLY   Salicylate level     Status: None   Collection Time: 08/02/16 12:51 PM  Result Value Ref Range   Salicylate Lvl <6.0 2.8 - 30.0 mg/dL  Acetaminophen level      Status: Abnormal   Collection Time: 08/02/16 12:51 PM  Result Value Ref Range   Acetaminophen (Tylenol), Serum <10 (L) 10 - 30 ug/mL    Comment:        THERAPEUTIC CONCENTRATIONS VARY SIGNIFICANTLY. A RANGE OF 10-30 ug/mL MAY BE AN EFFECTIVE CONCENTRATION FOR MANY PATIENTS. HOWEVER, SOME ARE BEST TREATED AT CONCENTRATIONS OUTSIDE THIS RANGE. ACETAMINOPHEN CONCENTRATIONS >150 ug/mL AT 4 HOURS AFTER INGESTION AND >50 ug/mL AT 12 HOURS AFTER INGESTION ARE OFTEN ASSOCIATED WITH TOXIC REACTIONS.   cbc     Status: Abnormal   Collection Time: 08/02/16 12:51 PM  Result Value Ref Range   WBC 12.8 (H) 4.0 - 10.5 K/uL   RBC 4.53 3.87 - 5.11 MIL/uL   Hemoglobin 11.7 (L) 12.0 - 15.0 g/dL   HCT 36.2 36.0 - 46.0 %   MCV 79.9 78.0 - 100.0 fL   MCH 25.8 (L) 26.0 - 34.0 pg   MCHC 32.3 30.0 - 36.0 g/dL   RDW 15.2 11.5 - 15.5 %   Platelets 686 (H) 150 - 400 K/uL    No current facility-administered medications for this encounter.    Current Outpatient Prescriptions  Medication Sig Dispense Refill  . Divalproex Sodium (DEPAKOTE PO) Take by mouth 2 (two) times daily.    Marland Kitchen RISPERIDONE PO Take by mouth 2 (two) times daily.      Musculoskeletal: Strength & Muscle Tone: unable to assess; pt in bed during evaluation  Gait & Station: unable to assess; pt in bed during evaluation  Patient leans: unable to assess; pt in bed during evaluation   Psychiatric Specialty Exam: Physical Exam  Nursing note and vitals reviewed.   ROS  Blood pressure 118/61, pulse 92, temperature 98.2 F (36.8 C), temperature source Oral, resp. rate 18, SpO2 92 %.There is no height or weight on file  to calculate BMI.  General Appearance: Fairly Groomed  Eye Contact:  Fair  Speech:  Clear and Coherent and Pressured  Volume:  Normal  Mood:  Dysphoric  Affect:  Labile  Thought Process:  Disorganized  Orientation:  Full (Time, Place, and Person)  Thought Content:  Tangential  Suicidal Thoughts:  No  Homicidal  Thoughts:  No  Memory:  Immediate;   Fair Recent;   Fair  Judgement:  Impaired  Insight:  Lacking  Psychomotor Activity:  Normal  Concentration:  Concentration: Fair and Attention Span: Fair  Recall:  [employer] of Knowledge:  Fair  Language:  Good  Akathisia:  No  Handed:    AIMS (if indicated):     Assets:  Communication Skills Desire for Improvement Financial Resources/Insurance Housing Physical Health  ADL's:  Intact  Cognition:  WNL  Sleep:       Case discussed with Dr. Modesta Messing; recommendations are: Treatment Plan Summary: Daily contact with patient to assess and evaluate symptoms and progress in treatment and Medication management  Start Risperdal 1 mg QHS Start Depakote 500 mg QHS  Disposition: Recommend psychiatric Inpatient admission when medically cleared.  Serena Colonel, PMHNP-BC, FNP-BC [place] 08/03/2016 12:58 PM

## 2016-08-03 NOTE — ED Notes (Signed)
Patient transferred to [facility] from [place].  She is very pleasant on approach, but also very disorganized and tangential.  She talks about how the grocery store ran out of [employer] red and she was not able to calm herself down.  She tried [employer] and it did not work like the red.  She also talked about how she had a large hemorrhoid recently and forgot how to treat it.  She was also talking about cabbage, but I really was not able to follow that part of the conversation.  She states she has Depakote and Risperdal at home, but she only takes them when she feels like she needs it.  I attempted to educate patient about the need to take these medications every day at a scheduled time.  She is not really in a place at this time to be able to verbalize understanding of this.  She was oriented to her surroundings and to the unit.

## 2016-08-03 NOTE — ED Notes (Signed)
Calls placed to patients 2 pharmacies of record- [facility] and [facility].  They both state that they have never filled prescriptions for this patient.  Double checked with patient to see where she fills prescriptions and [facility] stated [facility] on [address].  Manus Gunning, NP was notified.

## 2016-08-03 NOTE — ED Notes (Signed)
Report to include situation, background, assessment and recommendations from [facility]. Patient sleeping, respirations regular and unlabored. Q15 minute rounds and security camera observation to continue.

## 2016-08-04 ENCOUNTER — Emergency Department (HOSPITAL_COMMUNITY): Payer: Medicare Other

## 2016-08-04 MED ORDER — DIVALPROEX SODIUM ER 250 MG PO TB24
750.0000 mg | ORAL_TABLET | Freq: Every day | ORAL | Status: DC
Start: 2016-08-04 — End: 2016-08-06
  Administered 2016-08-04 – 2016-08-05 (×2): 750 mg via ORAL
  Filled 2016-08-04 (×2): qty 1

## 2016-08-04 NOTE — ED Notes (Signed)
Hourly rounding reveals patient sleeping in room. No complaints, stable, in no acute distress. Q15 minute rounds and monitoring via Security Cameras to continue. 

## 2016-08-04 NOTE — BH Assessment (Addendum)
Reassessment:   Writer met with patient face to face. Patient is a female presenting to the ED 08/02/2016 with increased paranoia, delusions, flight of ideas, poor eye contact, anxious mood, impaired judgement and insight. Today patient was focused on her lack of bowel movements over the past several days. . The patient would continues to shift from one thought to the next: referenced drinking wild irish rose daily, watching Bess Kinds and snakes following her in quick succession. She repeats multiple times and appears to have great concern about a snake. The patient denies SI, HI or A/V.  She has visual hallucinations of a snake. She believes the snake followed her to her last apartment and got in the apartment. The patient lives alone. Her son, [name], moved her to another apartment where she now believes the snake is outside and not inside her apartment currently. The patient admittedly does not see a psychiatrist or therapist. She states, "I don't need it." The patient believes she needs rest and a quiet place. Her son reports she is compliant with medication when in the hospital but will not take medication on her own. The patient admits to drinking wine daily. Its is unclear how much.

## 2016-08-04 NOTE — ED Notes (Signed)
DAR NOTE: Patient presents with anxious affect and depressed mood. Pt stayed in the room the whole shift. Denies pain, auditory and visual hallucinations. Maintained on routine safety checks.  Medications given as prescribed. Support and encouragement offered as needed.  Offered no complaint.

## 2016-08-04 NOTE — ED Notes (Signed)
Report to include situation, background, assessment and recommendations from [facility]. Patient sleeping, respirations regular and unlabored. Q15 minute rounds and security camera observation to continue.

## 2016-08-05 DIAGNOSIS — F3163 Bipolar disorder, current episode mixed, severe, without psychotic features: Secondary | ICD-10-CM | POA: Diagnosis not present

## 2016-08-05 DIAGNOSIS — Z818 Family history of other mental and behavioral disorders: Secondary | ICD-10-CM | POA: Diagnosis not present

## 2016-08-05 MED ORDER — RISPERIDONE 0.5 MG PO TABS
0.5000 mg | ORAL_TABLET | Freq: Two times a day (BID) | ORAL | Status: DC
Start: 2016-08-05 — End: 2016-08-06
  Administered 2016-08-05: 0.5 mg via ORAL
  Filled 2016-08-05: qty 1

## 2016-08-05 NOTE — Progress Notes (Signed)
Pt has been accepted by: [facility] Number for report is: [phone] Pt's room number will be: 151-1 Pt's unit will be: [facility]  CSW will update RN and EDP.  Facility wants to know that pt is voluntary or involuntary.    Alphonse Guild. Sendy Pluta, Latanya Presser, LCAS Clinical Social Worker Ph: [phone]

## 2016-08-05 NOTE — Progress Notes (Signed)
08/05/16 1353:  LRT went to pt room to offer activities, pt was sleep.  Victorino Sparrow, LRT/CTRS

## 2016-08-05 NOTE — ED Notes (Signed)
Hourly rounding reveals patient sleeping in room. No complaints, stable, in no acute distress. Q15 minute rounds and monitoring via Security Cameras to continue. 

## 2016-08-05 NOTE — ED Notes (Signed)
Pt sleeping at present, no distress noted, calm & cooperative at present.  Monitoring for safety, Q 15 min checks in effect. 

## 2016-08-05 NOTE — ED Notes (Signed)
This patient is pleasant and cooperative.  She is having flight of ideas. She denies Si, H/I, and AVH.  15 minute checks and video monitoring in place.

## 2016-08-05 NOTE — BH Assessment (Signed)
[facility] Assessment Progress Note  Per Corena Pilgrim, MD, this pt continues to require psychiatric hospitalization at this time.  The following facilities have been contacted to seek placement for this pt, with results as noted:  Beds available, information sent, decision pending:  [place]:  [place], [place] Triage Specialist [phone]

## 2016-08-05 NOTE — ED Notes (Signed)
Although patient has been in bed all day, when encouraged to shower she was willing and able.  Pt is pleasant and cooperative.  She is very soft spoken.  15 minute checks and video monitoring continue.

## 2016-08-05 NOTE — Consult Note (Signed)
[facility] Psych ED Progress Note  08/05/2016 11:04 AM Emily Duarte  MRN:  [id number]   Subjective: " I don't know why I am here.''  Objective: Patient was seen, interviewed, chart reviewed and case discussed with treatment team. Patient remains delusional, anxious, tangential with flight of ideas. She is focused on being constipated few days ago, jump into talking about  drinking wild irish rose daily, watching Bess Kinds and seeing snakes followingher around.Patient's thought process is very disorganized and trains of thought is difficult to follow.  Principal Problem: Bipolar 1 disorder, mixed, severe ([place]) Diagnosis:   Patient Active Problem List   Diagnosis Date Noted  . Bipolar 1 disorder, mixed, severe ([place]) [F31.63] 08/07/2012  . Cyst of soft tissue [M79.89] 08/07/2012   Total Time spent with patient: 25 minutes  Past Psychiatric History: Paranoid schizophrenia, Bipolar disorder disorder  Past Medical History:  Past Medical History:  Diagnosis Date  . Hemorrhoid   . Hypertension   . Snake bite     Past Surgical History:  Procedure Laterality Date  . TUBAL LIGATION     Family History:  Family History  Problem Relation Age of Onset  . Schizophrenia Sister    Family Psychiatric  History: as above Social History:  History  Alcohol Use  . Yes    Comment: has an [place] mash every day     History  Drug Use No    Social History   Social History  . Marital status: Married    Spouse name: N/A  . Number of children: N/A  . Years of education: N/A   Social History Main Topics  . Smoking status: Never Smoker  . Smokeless tobacco: Never Used  . Alcohol use Yes     Comment: has an [place] mash every day  . Drug use: No  . Sexual activity: No   Other Topics Concern  . None   Social History Narrative  . None    Sleep: Fair  Appetite:  Fair  Current Medications: Current Facility-Administered Medications  Medication Dose Route Frequency Provider Last  Rate Last Dose  . divalproex (DEPAKOTE ER) 24 hr tablet 750 mg  750 mg Oral QHS Norman Clay, MD   750 mg at 08/04/16 2102  . risperiDONE (RISPERDAL) tablet 1 mg  1 mg Oral QHS Lurena Nida, NP   1 mg at 08/04/16 2102   Current Outpatient Prescriptions  Medication Sig Dispense Refill  . Divalproex Sodium (DEPAKOTE PO) Take by mouth 2 (two) times daily.    Marland Kitchen RISPERIDONE PO Take by mouth 2 (two) times daily.      Lab Results: No results found for this or any previous visit (from the past 48 hour(s)).  Blood Alcohol level:  Lab Results  Component Value Date   ETH <5 08/02/2016   ETH <11 08/06/2012    Physical Findings: AIMS:  , ,  ,  ,    CIWA:    COWS:     Musculoskeletal: Strength & Muscle Tone: within normal limits Gait & Station: normal Patient leans: N/A  Psychiatric Specialty Exam: Physical Exam  Psychiatric: Judgment normal. Her mood appears anxious. Her speech is delayed and tangential. She is slowed and withdrawn. Thought content is paranoid and delusional. Cognition and memory are normal. She exhibits a depressed mood.    Review of Systems  Constitutional: Negative.   HENT: Negative.   Eyes: Negative.   Respiratory: Negative.   Cardiovascular: Negative.   Gastrointestinal: Negative.   Genitourinary: Negative.  Musculoskeletal: Negative.   Skin: Negative.   Neurological: Negative.   Endo/Heme/Allergies: Negative.   Psychiatric/Behavioral: Positive for depression. The patient is nervous/anxious.     Blood pressure (!) 114/59, pulse 94, temperature 98.8 F (37.1 C), temperature source Oral, resp. rate 16, SpO2 100 %.There is no height or weight on file to calculate BMI.  General Appearance: Casual  Eye Contact:  Minimal  Speech:  Pressured and Slow  Volume:  Decreased  Mood:  Depressed and Dysphoric  Affect:  Restricted  Thought Process:  Disorganized  Orientation:  Full (Time, Place, and Person)  Thought Content:  Illogical, Delusions, Hallucinations:  Visual and Paranoid Ideation  Suicidal Thoughts:  No  Homicidal Thoughts:  No  Memory:  Immediate;   Fair Recent;   Fair Remote;   Fair  Judgement:  Poor  Insight:  Shallow  Psychomotor Activity:  Decreased  Concentration:  Concentration: Fair and Attention Span: Fair  Recall:  [employer] of Knowledge:  Fair  Language:  Good  Akathisia:  No  Handed:  Right  AIMS (if indicated):     Assets:  Communication Skills Social Support  ADL's:  Intact  Cognition:  WNL  Sleep:   fair      Treatment Plan Summary: Daily contact with patient to assess and evaluate symptoms and progress in treatment and Medication management  Change Risperdal to 0.5 mg bid for delusions/psychosis Continue Depakote ER 750mg  qhs for Bipolar disorder.  Corena Pilgrim, MD 08/05/2016, 11:04 AM

## 2016-08-05 NOTE — Progress Notes (Signed)
CSW received a call from [place] at [facility] asking if pt needs a bed and any indication of alcohol levels.  CSW stated a bed is needed and on 4/13 pt has alcohol level of <5.  Mayer Camel will call back with a decision.  Alphonse Guild. Zarrah Loveland, Latanya Presser, LCAS Clinical Social Worker Ph: [phone]

## 2016-08-06 NOTE — ED Notes (Signed)
[facility] transport requested.  Pickup for 7.30am to [facility].

## 2016-08-06 NOTE — ED Notes (Signed)
Report called to RN Eston Esters, [facility].  Pending [facility] transport.

## 2016-08-08 ENCOUNTER — Ambulatory Visit (HOSPITAL_COMMUNITY): Payer: Medicare Other

## 2016-08-11 HISTORY — PX: ENDOMETRIAL BIOPSY: SHX622

## 2016-08-19 ENCOUNTER — Ambulatory Visit (INDEPENDENT_AMBULATORY_CARE_PROVIDER_SITE_OTHER): Payer: Medicare Other | Admitting: Obstetrics and Gynecology

## 2016-08-19 ENCOUNTER — Encounter: Payer: Self-pay | Admitting: Obstetrics and Gynecology

## 2016-08-19 DIAGNOSIS — C541 Malignant neoplasm of endometrium: Secondary | ICD-10-CM | POA: Insufficient documentation

## 2016-08-19 NOTE — Progress Notes (Signed)
Emily Duarte has need Dx with uterine cancer since her [facility] visit and has appt with Gyn Onc at [facility] on May 06/2016.

## 2016-08-22 ENCOUNTER — Encounter: Payer: Self-pay | Admitting: Radiation Oncology

## 2016-08-23 NOTE — Progress Notes (Signed)
GYN Location of Tumor / Histology: endometrial cancer  Emily Duarte presented with symptoms of: postmenopausal bleeding which she was an inpatient for suicidal ideation.  Biopsies revealed:   08/11/16 1. Endocervix, curettings and polyp. (*please see comment below). --High-grade carcinoma, undifferentiated.  2. Endometrial biopsy:(*please see comment below). --High-grade carcinoma, undifferentiated. --Weakly positive for estrogen receptors suggestive of endometrial origin; Negative for progesterone receptors and neutral mucin..   Past/Anticipated interventions by Gyn/Onc surgery, if any: currently not surgically resectable.  Per Dr. Rhodia Albright - patient should have pelvic radiation/chemotherapy as soon as possible and then reassess for surgical resection.  Past/Anticipated interventions by medical oncology, if any: chemotherapy recommended.    Weight changes, if any: no  Bowel/Bladder complaints, if any: no  Nausea/Vomiting, if any: no  Pain issues, if any:  no  SAFETY ISSUES:  Prior radiation? no  Pacemaker/ICD? no  Possible current pregnancy? no  Is the patient on methotrexate? no  Current Complaints / other details:  Patient reports having a small amount of brown vaginal discharge.  Patient is here with her son.  She has a family history of colon cancer in her mother and sister.  BP 100/62 (BP Location: Right Arm, Patient Position: Sitting)   Pulse 97   Temp 98 F (36.7 C) (Oral)   Ht 5\' 4"  (1.626 m)   Wt 200 lb 12.8 oz (91.1 kg)   SpO2 97%   BMI 34.47 kg/m    Wt Readings from Last 3 Encounters:  08/28/16 200 lb 12.8 oz (91.1 kg)  08/19/16 208 lb 1.6 oz (94.4 kg)

## 2016-08-28 ENCOUNTER — Ambulatory Visit
Admission: RE | Admit: 2016-08-28 | Discharge: 2016-08-28 | Disposition: A | Discharge status: Home / Self Care | Payer: Medicare Other | Source: Ambulatory Visit | Attending: Radiation Oncology | Admitting: Radiation Oncology

## 2016-08-28 ENCOUNTER — Encounter: Payer: Self-pay | Admitting: Radiation Oncology

## 2016-08-28 VITALS — BP 100/62 | HR 97 | Temp 98.0°F | Ht 64.0 in | Wt 200.8 lb

## 2016-08-28 DIAGNOSIS — I10 Essential (primary) hypertension: Secondary | ICD-10-CM | POA: Insufficient documentation

## 2016-08-28 DIAGNOSIS — C541 Malignant neoplasm of endometrium: Secondary | ICD-10-CM

## 2016-08-28 DIAGNOSIS — F200 Paranoid schizophrenia: Secondary | ICD-10-CM | POA: Insufficient documentation

## 2016-08-28 DIAGNOSIS — Z79899 Other long term (current) drug therapy: Secondary | ICD-10-CM | POA: Insufficient documentation

## 2016-08-28 HISTORY — DX: Paranoid schizophrenia: F20.0

## 2016-08-28 HISTORY — DX: Malignant neoplasm of endometrium: C54.1

## 2016-08-28 LAB — COMPREHENSIVE METABOLIC PANEL
ALBUMIN: 3.2 g/dL — AB (ref 3.5–5.0)
ALK PHOS: 97 U/L (ref 40–150)
ALT: 13 U/L (ref 0–55)
ANION GAP: 12 meq/L — AB (ref 3–11)
AST: 23 U/L (ref 5–34)
BUN: 17.6 mg/dL (ref 7.0–26.0)
CALCIUM: 9.6 mg/dL (ref 8.4–10.4)
CHLORIDE: 99 meq/L (ref 98–109)
CO2: 27 mEq/L (ref 22–29)
Creatinine: 1.2 mg/dL — ABNORMAL HIGH (ref 0.6–1.1)
EGFR: 47 mL/min/{1.73_m2} — ABNORMAL LOW (ref >90)
Glucose: 99 mg/dl (ref 70–140)
Potassium: 4.1 mEq/L (ref 3.5–5.1)
Sodium: 139 mEq/L (ref 136–145)
Total Bilirubin: 1.11 mg/dL (ref 0.20–1.20)
Total Protein: 8.6 g/dL — ABNORMAL HIGH (ref 6.4–8.3)

## 2016-08-28 LAB — CBC WITH DIFFERENTIAL/PLATELET
BASO%: 0.8 % (ref 0.0–2.0)
Basophils Absolute: 0.1 10*3/uL (ref 0.0–0.1)
EOS%: 1.5 % (ref 0.0–7.0)
Eosinophils Absolute: 0.2 10*3/uL (ref 0.0–0.5)
HEMATOCRIT: 36.3 % (ref 34.8–46.6)
HEMOGLOBIN: 11.6 g/dL (ref 11.6–15.9)
LYMPH#: 2.2 10*3/uL (ref 0.9–3.3)
LYMPH%: 21.7 % (ref 14.0–49.7)
MCH: 26 pg (ref 25.1–34.0)
MCHC: 32 g/dL (ref 31.5–36.0)
MCV: 81 fL (ref 79.5–101.0)
MONO#: 0.8 10*3/uL (ref 0.1–0.9)
MONO%: 7.9 % (ref 0.0–14.0)
NEUT#: 6.9 10*3/uL — ABNORMAL HIGH (ref 1.5–6.5)
NEUT%: 68.1 % (ref 38.4–76.8)
PLATELETS: 466 10*3/uL — AB (ref 145–400)
RBC: 4.48 10*6/uL (ref 3.70–5.45)
RDW: 17.6 % — AB (ref 11.2–14.5)
WBC: 10.1 10*3/uL (ref 3.9–10.3)

## 2016-08-28 LAB — MAGNESIUM: MAGNESIUM: 2.2 mg/dL (ref 1.5–2.5)

## 2016-08-28 NOTE — Progress Notes (Signed)
Please see the Nurse Progress Note in the MD Initial Consult Encounter for this patient. 

## 2016-08-28 NOTE — Progress Notes (Signed)
Radiation Oncology         [phone] ________________________________  Initial Outpatient Consultation  Name: Emily Duarte MRN: [id number]  Date: 08/28/2016  DOB: [birth year]  CC:Ricke Hey, MD  Fay Records, MD   REFERRING PHYSICIAN: Fay Records, MD  DIAGNOSIS: Inoperable high-grade undifferentiated endometrial cancer  HISTORY OF PRESENT ILLNESS::Emily Duarte is a 66 y.o. female who was admitted to  [facility] on 08/01/16 with postmenopausal vaginal bleeding lasting 10 days. However, she was transferred to [facility] on 08/06/16 for suicidal ideation. There, GYN was consulted and ordered biopsy. Pt was transferred to medical unit for blood transfusion.  US pelvis transabdominal and transvaginal w/ doppler on 08/10/16 revealed probably anterior exophytic fundal fibroid measuring 4.5 cm. Right ovary was not visible.Left ovary unremarkable.  Biopsy was performed on 08/11/16 revealing high-grade undifferentiated carcinoma weakly positive for estrogen receptors suggestive of endometrial origin, negative for progesterone receptors and neutral mucin. Also noted were endocervix curettings and polyp indicative of high-grade carcinoma, undifferentiated.   A CT abdomen/pelvis was performed on 08/14/16 revealing uterine mass and abnormality consistent with hx of endometrial carcinoma. Bilateral pelvic and retroperitoneal  Adenopathy consistent with nodal metastasis.   The patient was seen by Dr. Fay Records at [facility].  On examination the uterus was noted to be enlarged with the generous lower uterine segment measuring approximately 6 cm concerning for endocervical/cervical extension. In addition there was some concern of anterior vaginal wall involvement. Given these findings the patient was not felt to be a candidate for surgical resection. In addition on imaging the patient was felt to have bilateral pelvic lymphadenopathy and likely  periaortic lymphadenopathy.  Dr. Rhodia Albright advised patient should have pelvic radiation/chemotherapy as soon as possible in light of bleeding issues with reassessment for surgical resection thereafter.  Pt was referred to radiation oncology for consult and evaluation. Patient lives in the [place] area. Her son also lives in this area.  PREVIOUS RADIATION THERAPY: No  PAST MEDICAL HISTORY:  has a past medical history of Endometrial cancer ([place]); Hemorrhoid; Hypertension; Paranoid schizophrenia ([place]); and Snake bite.    PAST SURGICAL HISTORY: Past Surgical History:  Procedure Laterality Date  . ENDOMETRIAL BIOPSY  08/11/2016  . TUBAL LIGATION      FAMILY HISTORY: family history includes Colon cancer in her mother and sister; Schizophrenia in her sister. The patient's son also reports psychiatric illness  SOCIAL HISTORY:  reports that she has never smoked. She has never used smokeless tobacco. She reports that she does not drink alcohol or use drugs. Patient was very functional until mid 28s when her psychiatric illness developed. 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  ALLERGIES: Patient has no known allergies.  MEDICATIONS:  Current Outpatient Prescriptions  Medication Sig Dispense Refill  . divalproex (DEPAKOTE ER) 500 MG 24 hr tablet Take by mouth daily.     Marland Kitchen omega-3 acid ethyl esters (LOVAZA) 1 g capsule Take by mouth.    Marland Kitchen HYDROcodone-acetaminophen (NORCO/VICODIN) 5-325 MG tablet Take 1 tablet by mouth twice a day as needed for pain or three times daily as needed    . RISPERIDONE PO Take by mouth 2 (two) times daily.     No current facility-administered medications for this encounter.     REVIEW OF SYSTEMS:  REVIEW OF SYSTEMS: A 10+ POINT REVIEW OF SYSTEMS WAS OBTAINED including neurology, dermatology, psychiatry, cardiac, respiratory, lymph, extremities, GI, GU, musculoskeletal, constitutional, reproductive, HEENT.  All pertinent positives  are noted in the HPI. All others are negative.    PHYSICAL EXAM:  height is 5\' 4"  (1.626 m) and weight is 200 lb 12.8 oz (91.1 kg). Her oral temperature is 98 F (36.7 C). Her blood pressure is 100/62 and her pulse is 97. Her oxygen saturation is 97%.   General: Alert and oriented, in no acute distress, She is accompanied by her son who seems to be very supportive HEENT: Head is normocephalic. Extraocular movements are intact. Oropharynx is clear. Neck: Neck is supple, no palpable cervical or supraclavicular lymphadenopathy. Heart: Regular in rate and rhythm with no murmurs, rubs, or gallops. Chest: Clear to auscultation bilaterally, with no rhonchi, wheezes, or rales. Abdomen: Soft, nontender, nondistended, with no rigidity or guarding. Extremities: No cyanosis or edema. Lymphatics: see Neck Exam Skin: No concerning lesions. Musculoskeletal: symmetric strength and muscle tone throughout. Neurologic: Cranial nerves II through XII are grossly intact. No obvious focalities.  Coordination is intact. Psychiatric: The patient speech was tangential and pressured. She would occasionally answer questions appropriately. Very pleasant and cooperative with the examination No inguinal adenopathy appreciated. On pelvic examination the external genitalia are unremarkable. A speculum exam was performed and serous sanguinous fluid was noted in the vaginal vault. A mass was noted in the region of the cervix on speculum examination. On bimanual rectovaginal examination the patient was noted to have an enlarged uterus with probable extension into the cervix with cervical area measuring approximately 5-6 cm in size. Question of anterior vaginal wall extension on exam. Lesion bled easily during the examination.    ECOG = 1  LABORATORY DATA:  Lab Results  Component Value Date   WBC 12.8 (H) 08/02/2016   HGB 11.7 (L) 08/02/2016   HCT 36.2 08/02/2016   MCV 79.9 08/02/2016   PLT 686 (H) 08/02/2016   NEUTROABS  10.4 (H) 07/12/2009   Lab Results  Component Value Date   NA 135 08/02/2016   K 3.6 08/02/2016   CL 96 (L) 08/02/2016   CO2 26 08/02/2016   GLUCOSE 131 (H) 08/02/2016   CREATININE 1.14 (H) 08/02/2016   CALCIUM 9.4 08/02/2016      RADIOGRAPHY: Dg Chest 2 View  Result Date: 08/04/2016 CLINICAL DATA:  Cough. EXAM: CHEST  2 VIEW COMPARISON:  07/13/2009 chest radiograph FINDINGS: The cardiomediastinal silhouette is unremarkable. There is no evidence of focal airspace disease, pulmonary edema, suspicious pulmonary nodule/mass, pleural effusion, or pneumothorax. No acute bony abnormalities are identified. IMPRESSION: No active cardiopulmonary disease. Electronically Signed   By: Margarette Canada M.D.   On: 08/04/2016 14:11      IMPRESSION:  Inoperable high-grade undifferentiated endometrial cancer. Patient would be a good candidate for radiation therapy directed to the pelvis and periaortic region.  I discussed the course of treatment side effects and potential long-term toxicities with the patient and her son. The patient appears to understand and wishes to proceed with planned course of treatment.  PLAN: patient will proceed with simulation in the near future and treatments to begin the next week. Anticipate approximately 5-1/2-6 weeks of radiation therapy as part of her management. If the patient continues to be unresectable then may consider a uterine high-dose rate implant (iridium 192) with Heyman capsules for more durable control. Patient will also be referred to medical oncology.    ------------------------------------------------  Blair Promise, PhD, MD   This document serves as a record of services personally performed by Gery Pray, MD. It was created on his behalf by  Linward Natal, a trained medical scribe. The creation of this record is based on the scribe's personal observations and the provider's statements to them. This document has been checked and approved by the attending  provider.

## 2016-08-29 ENCOUNTER — Telehealth: Payer: Self-pay | Admitting: *Deleted

## 2016-08-29 NOTE — Telephone Encounter (Signed)
XXXX 

## 2016-08-29 NOTE — Telephone Encounter (Signed)
I have called and spoke to the daughter in law. They are aware of the date and time for tomorrow

## 2016-08-29 NOTE — Telephone Encounter (Signed)
CALLED PATIENT TO INFORM OF APPT. WITH DR. Alvy Bimler ON 08-30-16 @ 2 PM, PT. AWARE OF APPT.

## 2016-08-30 ENCOUNTER — Telehealth: Payer: Self-pay | Admitting: Hematology and Oncology

## 2016-08-30 ENCOUNTER — Ambulatory Visit (HOSPITAL_BASED_OUTPATIENT_CLINIC_OR_DEPARTMENT_OTHER): Payer: Medicare Other | Admitting: Hematology and Oncology

## 2016-08-30 ENCOUNTER — Encounter: Payer: Self-pay | Admitting: Hematology and Oncology

## 2016-08-30 VITALS — BP 124/63 | HR 87 | Temp 98.7°F | Resp 18 | Ht 64.0 in | Wt 209.4 lb

## 2016-08-30 DIAGNOSIS — F3163 Bipolar disorder, current episode mixed, severe, without psychotic features: Secondary | ICD-10-CM

## 2016-08-30 DIAGNOSIS — C541 Malignant neoplasm of endometrium: Secondary | ICD-10-CM | POA: Diagnosis present

## 2016-08-30 NOTE — Assessment & Plan Note (Signed)
The patient had with severe depression recently to the point of suicidal. Today, she denies suicidal ideation. She will continue Depakote for mood stabilizer

## 2016-08-30 NOTE — Progress Notes (Signed)
START OFF PATHWAY REGIMEN - Uterine   [id number]:Carboplatin AUC=6 + Paclitaxel 175 mg/m2 q21 Days:   A cycle is every 21 days:     Paclitaxel      Carboplatin   **Always confirm dose/schedule in your pharmacy ordering system**    Patient Characteristics: High Grade Undifferentiated/Leiomyosarcoma, Medically Inoperable, First Line, Stage III, IV, Symptomatic AJCC N Category: N1 Patient Status: Medically Inoperable AJCC M Category: M0 AJCC 8 Stage Grouping: IIIC AJCC T Category: T3 Line of therapy: First Line Would you be surprised if this patient died  in the next year? I would be surprised if this patient died in the next year Asymptomatic or Symptomatic? Symptomatic  Intent of Therapy: Curative Intent, Discussed with Patient

## 2016-08-30 NOTE — Progress Notes (Signed)
[facility] CONSULT NOTE  Patient Care Team: Ricke Hey, MD as PCP - General (Family Medicine)  CHIEF COMPLAINTS/PURPOSE OF CONSULTATION:  Newly diagnosed endometrial cancer, unresectable  HISTORY OF PRESENTING ILLNESS:  Emily Duarte 66 y.o. female is here because of recent diagnosis of endometrial cancer. The patient herself is a poor historian. She has 1 son and he is the dedicated healthcare power of attorney.  His name is [place].  Deveron Furlong is present throughout the visit and was able to provide good history. I reviewed her records extensively. This patient had recent postmenopausal bleeding and was admitted to a psychiatric hospital for severe depression and suicidal ideation.  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 However, she was transferred to [facility] on 08/06/16 for suicidal ideation. There, GYN was consulted and ordered biopsy. Pt was transferred to medical unit for blood transfusion.       08/10/2016 Imaging    US pelvis:  The uterus is prominent and heterogeneous measuring 10.9 x 6.3 x 6.1 cm. There is a probable anterior exophytic fundal fibroid measuring 4.5 cm. The endometrial stripe is normal in thickness at 6.4 mm. I see no fluid in the canal  Right ovary is not visible. Left ovary 2.3 x 1.7 x 1.9 cm with normal echogenicity. Color flow and spectral Doppler: Normal arterial inflow and venous outflow.  There is no significant free fluid.   I see no adnexal masses.      08/11/2016 - 08/15/2016 Hospital Admission    She was admitted voluntarily to Psychiatry unit 08/06/2016 for suicidal ideation.She was evaluated in an outside facility due to vaginal bleeding and endometrial and endocervical biopsies  performed. Pathology revealed a poorly differentiated cancer with positive estrogen receptors felt to be suggestive of endometrial cancer. The patient received 2 units of prbcs total during her stay for anemia.        08/11/2016 Pathology Results       1. Endocervix, curettings and polyp. (*please see comment below). --High-grade carcinoma, undifferentiated.  2. Endometrial biopsy:(*please see comment below). --High-grade carcinoma, undifferentiated. --Weakly positive for estrogen receptors suggestive of endometrial origin; Negative for progesterone receptors and neutral mucin..       08/14/2016 Imaging    CT imaging 1. Uterine mass and abnormality consistent with clinical history of endometrial carcinoma as described above with bilateral pelvic and retroperitoneal adenopathy consistent with nodal metastasis. 2. No evidence of nonnodal distant metastasis. 3. Nonspecific incidental finding of hypertrophy of the pylorus muscle. 4. incidental finding of cholelithiasis      The patient continues to have heavy menstruation daily.  She change her pad every 8 hours.  She denies passage of clots and denies pelvic pain. She has no recent changes in appetite or weight  MEDICAL HISTORY:  Past Medical History:  Diagnosis Date  . Endometrial cancer ([place])   . Hemorrhoid   . Hypertension   . Paranoid schizophrenia ([place])   . Snake bite     SURGICAL HISTORY: Past Surgical History:  Procedure Laterality Date  . ENDOMETRIAL BIOPSY  08/11/2016  . TUBAL LIGATION      SOCIAL HISTORY: Social History   Social History  . Marital status: Married    Spouse name: N/A  . Number of children: 1  . Years of education: N/A  Occupational History  . Retired    Social History Main Topics  . Smoking status: Never Smoker  . Smokeless tobacco: Never Used  . Alcohol use No  . Drug use: No  . Sexual activity: No   Other Topics Concern  . Not on file   Social History Narrative  . No narrative  on file    FAMILY HISTORY: Family History  Problem Relation Age of Onset  . Colon cancer Mother        late 47s  . Schizophrenia Sister   . Colon cancer Sister        late 15 s    ALLERGIES:  has No Known Allergies.  MEDICATIONS:  Current Outpatient Prescriptions  Medication Sig Dispense Refill  . divalproex (DEPAKOTE ER) 500 MG 24 hr tablet Take by mouth daily.     Marland Kitchen HYDROcodone-acetaminophen (NORCO/VICODIN) 5-325 MG tablet Take 1 tablet by mouth twice a day as needed for pain or three times daily as needed    . omega-3 acid ethyl esters (LOVAZA) 1 g capsule Take by mouth.    Marland Kitchen RISPERIDONE PO Take by mouth 2 (two) times daily.     No current facility-administered medications for this visit.     REVIEW OF SYSTEMS:   Constitutional: Denies fevers, chills or abnormal night sweats Eyes: Denies blurriness of vision, double vision or watery eyes Ears, nose, mouth, throat, and face: Denies mucositis or sore throat Respiratory: Denies cough, dyspnea or wheezes Cardiovascular: Denies palpitation, chest discomfort or lower extremity swelling Gastrointestinal:  Denies nausea, heartburn or change in bowel habits Skin: Denies abnormal skin rashes Lymphatics: Denies new lymphadenopathy or easy bruising Neurological:Denies numbness, tingling or new weaknesses Behavioral/Psych: Mood is stable, no new changes  All other systems were reviewed with the patient and are negative.  PHYSICAL EXAMINATION: ECOG PERFORMANCE STATUS: 1 - Symptomatic but completely ambulatory  Vitals:   08/30/16 1403  BP: 124/63  Pulse: 87  Resp: 18  Temp: 98.7 F (37.1 C)   Filed Weights   08/30/16 1403  Weight: 209 lb 6.4 oz (95 kg)    GENERAL:alert, no distress and comfortable.  Poor personal hygiene SKIN: skin color, texture, turgor are normal, no rashes or significant lesions EYES: normal, conjunctiva are pink and non-injected, sclera clear OROPHARYNX:no exudate, no erythema and lips, buccal  mucosa, and tongue normal  NECK: supple, thyroid normal size, non-tender, without nodularity LYMPH:  no palpable lymphadenopathy in the cervical, axillary or inguinal LUNGS: clear to auscultation and percussion with normal breathing effort HEART: regular rate & rhythm and no murmurs and no lower extremity edema ABDOMEN:abdomen soft, non-tender and normal bowel sounds Musculoskeletal:no cyanosis of digits and no clubbing  PSYCH: alert & oriented x 3 with fluent speech NEURO: no focal motor/sensory deficits  LABORATORY DATA:  I have reviewed the data as listed Lab Results  Component Value Date   WBC 10.1 08/28/2016   HGB 11.6 08/28/2016   HCT 36.3 08/28/2016   MCV 81.0 08/28/2016   PLT 466 (H) 08/28/2016    Recent Labs  08/02/16 1251 08/28/16 1456  NA 135 139  K 3.6 4.1  CL 96*  --   CO2 26 27  GLUCOSE 131* 99  BUN 15 17.6  CREATININE 1.14* 1.2*  CALCIUM 9.4 9.6  GFRNONAA 49*  --   GFRAA 56*  --   PROT 9.0* 8.6*  ALBUMIN 3.6 3.2*  AST 23 23  ALT 12* 13  ALKPHOS 89 97  BILITOT 0.5 1.11  RADIOGRAPHIC STUDIES: I have personally reviewed the radiological images as listed and agreed with the findings in the report. Dg Chest 2 View  Result Date: 08/04/2016 CLINICAL DATA:  Cough. EXAM: CHEST  2 VIEW COMPARISON:  07/13/2009 chest radiograph FINDINGS: The cardiomediastinal silhouette is unremarkable. There is no evidence of focal airspace disease, pulmonary edema, suspicious pulmonary nodule/mass, pleural effusion, or pneumothorax. No acute bony abnormalities are identified. IMPRESSION: No active cardiopulmonary disease. Electronically Signed   By: Margarette Canada M.D.   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 The patient appears to have poor understanding of the overall situation. I recommend CT scan of the chest to complete staging I recommend chemotherapy with carboplatin  and Taxol for 3 cycles before radiation I recommend port placement and chemo education class I tried to reach out to the radiation oncologist to discuss this but he is away I will try to talk to him again next week I will see  her back next week for further discussion. Given personal history of endometrial cancer and strong family history of colon cancer, I recommend genetic counseling and she agreed to proceed  Bipolar 1 disorder, mixed, severe ([place]) The patient had with severe depression recently to the point of suicidal. Today, she denies suicidal ideation. She will continue Depakote for mood stabilizer    All questions were answered. The patient knows to call the clinic with any problems, questions or concerns. I spent 60 minutes counseling the patient face to face. The total time spent in the appointment was 80 minutes and more than 50% was on counseling.     Heath Lark, MD 08/30/2016 4:52 PM

## 2016-08-30 NOTE — Telephone Encounter (Signed)
Scheduled appt per 5/11 los. - unable to schedule 5/21 treatment - to contact patient when treatment is scheduled.

## 2016-08-30 NOTE — Assessment & Plan Note (Addendum)
I reviewed the guidelines with the patient and her son. The patient appears to have poor understanding of the overall situation. I recommend CT scan of the chest to complete staging I recommend chemotherapy with carboplatin and Taxol for 3 cycles before radiation I recommend port placement and chemo education class I tried to reach out to the radiation oncologist to discuss this but he is away I will try to talk to him again next week I will see  her back next week for further discussion. Given personal history of endometrial cancer and strong family history of colon cancer, I recommend genetic counseling and she agreed to proceed

## 2016-09-03 ENCOUNTER — Ambulatory Visit: Admission: RE | Admit: 2016-09-03 | Payer: Medicare Other | Source: Ambulatory Visit | Admitting: Radiation Oncology

## 2016-09-03 ENCOUNTER — Ambulatory Visit: Payer: Medicare Other

## 2016-09-03 ENCOUNTER — Telehealth: Payer: Self-pay | Admitting: Hematology and Oncology

## 2016-09-03 ENCOUNTER — Other Ambulatory Visit: Payer: Self-pay | Admitting: Hematology and Oncology

## 2016-09-03 NOTE — Telephone Encounter (Signed)
I spoke with the patient and family. There is no further report of vaginal bleeding. I have discussed the case with radiation oncologist. He is in agreement that we should proceed with 3 cycles of chemotherapy first before radiation. I will alert the radiation department to cancel her appointment today.

## 2016-09-04 ENCOUNTER — Ambulatory Visit (HOSPITAL_COMMUNITY)
Admission: RE | Admit: 2016-09-04 | Discharge: 2016-09-04 | Disposition: A | Discharge status: Home / Self Care | Payer: Medicare Other | Source: Ambulatory Visit | Attending: Hematology and Oncology | Admitting: Hematology and Oncology

## 2016-09-04 ENCOUNTER — Encounter: Payer: Self-pay | Admitting: *Deleted

## 2016-09-04 ENCOUNTER — Other Ambulatory Visit: Payer: Self-pay | Admitting: Radiology

## 2016-09-04 ENCOUNTER — Encounter (HOSPITAL_COMMUNITY): Payer: Self-pay

## 2016-09-04 ENCOUNTER — Telehealth: Payer: Self-pay | Admitting: Hematology and Oncology

## 2016-09-04 DIAGNOSIS — C541 Malignant neoplasm of endometrium: Secondary | ICD-10-CM | POA: Diagnosis present

## 2016-09-04 DIAGNOSIS — I517 Cardiomegaly: Secondary | ICD-10-CM | POA: Insufficient documentation

## 2016-09-04 DIAGNOSIS — J90 Pleural effusion, not elsewhere classified: Secondary | ICD-10-CM | POA: Diagnosis not present

## 2016-09-04 DIAGNOSIS — R918 Other nonspecific abnormal finding of lung field: Secondary | ICD-10-CM | POA: Diagnosis not present

## 2016-09-04 MED ORDER — IOPAMIDOL (ISOVUE-300) INJECTION 61%
INTRAVENOUS | Status: AC
Start: 2016-09-04 — End: 2016-09-04
  Administered 2016-09-04: 75 mL
  Filled 2016-09-04: qty 75

## 2016-09-04 NOTE — Patient Instructions (Signed)
Confirmed appt with family member who answered pts cell phone.  Arrive in Radiology at 10:00.

## 2016-09-04 NOTE — Telephone Encounter (Signed)
Spoke with son - aware that appts have been added - will pick up new schedule next visit.

## 2016-09-05 ENCOUNTER — Ambulatory Visit (HOSPITAL_COMMUNITY)
Admission: RE | Admit: 2016-09-05 | Discharge: 2016-09-05 | Disposition: A | Discharge status: Home / Self Care | Payer: Medicare Other | Source: Ambulatory Visit | Attending: Hematology and Oncology | Admitting: Hematology and Oncology

## 2016-09-05 ENCOUNTER — Encounter (HOSPITAL_COMMUNITY): Payer: Self-pay

## 2016-09-05 ENCOUNTER — Other Ambulatory Visit: Payer: Self-pay | Admitting: Hematology and Oncology

## 2016-09-05 DIAGNOSIS — C541 Malignant neoplasm of endometrium: Secondary | ICD-10-CM | POA: Insufficient documentation

## 2016-09-05 DIAGNOSIS — F200 Paranoid schizophrenia: Secondary | ICD-10-CM | POA: Diagnosis not present

## 2016-09-05 DIAGNOSIS — I10 Essential (primary) hypertension: Secondary | ICD-10-CM | POA: Diagnosis not present

## 2016-09-05 HISTORY — PX: IR FLUORO GUIDE PORT INSERTION RIGHT: IMG5741

## 2016-09-05 HISTORY — PX: IR US GUIDE VASC ACCESS RIGHT: IMG2390

## 2016-09-05 LAB — COMPREHENSIVE METABOLIC PANEL
ALBUMIN: 3 g/dL — AB (ref 3.5–5.0)
ALK PHOS: 335 U/L — AB (ref 38–126)
ALT: 88 U/L — ABNORMAL HIGH (ref 14–54)
ANION GAP: 9 (ref 5–15)
AST: 147 U/L — ABNORMAL HIGH (ref 15–41)
BUN: 13 mg/dL (ref 6–20)
CALCIUM: 8.7 mg/dL — AB (ref 8.9–10.3)
CHLORIDE: 106 mmol/L (ref 101–111)
CO2: 24 mmol/L (ref 22–32)
Creatinine, Ser: 0.9 mg/dL (ref 0.44–1.00)
GFR calc Af Amer: >60 mL/min (ref >60)
GFR calc non Af Amer: >60 mL/min (ref >60)
Glucose, Bld: 94 mg/dL (ref 65–99)
POTASSIUM: 3.9 mmol/L (ref 3.5–5.1)
SODIUM: 139 mmol/L (ref 135–145)
Total Bilirubin: 0.6 mg/dL (ref 0.3–1.2)
Total Protein: 7.3 g/dL (ref 6.5–8.1)

## 2016-09-05 LAB — CBC WITH DIFFERENTIAL/PLATELET
BASOS ABS: 0 10*3/uL (ref 0.0–0.1)
BASOS PCT: 0 %
Eosinophils Absolute: 0.3 10*3/uL (ref 0.0–0.7)
Eosinophils Relative: 4 %
HEMATOCRIT: 31.2 % — AB (ref 36.0–46.0)
HEMOGLOBIN: 9.8 g/dL — AB (ref 12.0–15.0)
LYMPHS ABS: 1.3 10*3/uL (ref 0.7–4.0)
LYMPHS PCT: 19 %
MCH: 26 pg (ref 26.0–34.0)
MCHC: 31.4 g/dL (ref 30.0–36.0)
MCV: 82.8 fL (ref 78.0–100.0)
MONOS PCT: 7 %
Monocytes Absolute: 0.4 10*3/uL (ref 0.1–1.0)
NEUTROS ABS: 4.6 10*3/uL (ref 1.7–7.7)
Neutrophils Relative %: 70 %
Platelets: 390 10*3/uL (ref 150–400)
RBC: 3.77 MIL/uL — ABNORMAL LOW (ref 3.87–5.11)
RDW: 17.4 % — AB (ref 11.5–15.5)
WBC: 6.6 10*3/uL (ref 4.0–10.5)

## 2016-09-05 LAB — PROTIME-INR
INR: 1.15
Prothrombin Time: 14.7 seconds (ref 11.4–15.2)

## 2016-09-05 MED ORDER — MIDAZOLAM HCL 2 MG/2ML IJ SOLN
INTRAMUSCULAR | Status: AC | PRN
  Administered 2016-09-05 (×2): 1 mg via INTRAVENOUS

## 2016-09-05 MED ORDER — FENTANYL CITRATE (PF) 100 MCG/2ML IJ SOLN
INTRAMUSCULAR | Status: AC
  Filled 2016-09-05: qty 6

## 2016-09-05 MED ORDER — CEFAZOLIN SODIUM-DEXTROSE 2-4 GM/100ML-% IV SOLN
2.0000 g | INTRAVENOUS | Status: AC
Start: 2016-09-05 — End: 2016-09-05
  Administered 2016-09-05: 2 g via INTRAVENOUS

## 2016-09-05 MED ORDER — LIDOCAINE-EPINEPHRINE (PF) 2 %-1:200000 IJ SOLN
INTRAMUSCULAR | Status: AC | PRN
  Administered 2016-09-05: 10 mL via INTRADERMAL

## 2016-09-05 MED ORDER — HEPARIN SOD (PORK) LOCK FLUSH 100 UNIT/ML IV SOLN
INTRAVENOUS | Status: AC
  Administered 2016-09-05: 15:00:00
  Filled 2016-09-05: qty 5

## 2016-09-05 MED ORDER — MIDAZOLAM HCL 2 MG/2ML IJ SOLN
INTRAMUSCULAR | Status: AC
  Filled 2016-09-05: qty 8

## 2016-09-05 MED ORDER — LIDOCAINE-EPINEPHRINE (PF) 2 %-1:200000 IJ SOLN
INTRAMUSCULAR | Status: AC
  Administered 2016-09-05: 15:00:00
  Filled 2016-09-05: qty 20

## 2016-09-05 MED ORDER — FENTANYL CITRATE (PF) 100 MCG/2ML IJ SOLN
INTRAMUSCULAR | Status: AC | PRN
  Administered 2016-09-05: 50 ug via INTRAVENOUS
  Administered 2016-09-05: 25 ug via INTRAVENOUS

## 2016-09-05 MED ORDER — SODIUM CHLORIDE 0.9 % IV SOLN
INTRAVENOUS | Status: DC
  Administered 2016-09-05: 11:00:00 via INTRAVENOUS

## 2016-09-05 MED ORDER — CEFAZOLIN SODIUM-DEXTROSE 2-4 GM/100ML-% IV SOLN
INTRAVENOUS | Status: AC
  Filled 2016-09-05: qty 100

## 2016-09-05 NOTE — Consult Note (Signed)
Chief Complaint: Patient was seen in consultation today for Port-A-Cath placement  Referring Physician(s): Lilly  Supervising Physician: Sandi Mariscal  Patient Status: [facility] - Out-pt  History of Present Illness: Emily Duarte is a 66 y.o. female with history of newly diagnosed endometrial carcinoma who presents today for Port-A-Cath placement for chemotherapy.  Past Medical History:  Diagnosis Date  . Endometrial cancer ([place])   . Hemorrhoid   . Hypertension   . Paranoid schizophrenia ([place])   . Snake bite     Past Surgical History:  Procedure Laterality Date  . ENDOMETRIAL BIOPSY  08/11/2016  . TUBAL LIGATION      Allergies: Patient has no known allergies.  Medications: Prior to Admission medications   Medication Sig Start Date End Date Taking? Authorizing Provider  divalproex (DEPAKOTE ER) 500 MG 24 hr tablet Take by mouth daily.  08/15/16 08/15/17  [provider]  HYDROcodone-acetaminophen (NORCO/VICODIN) 5-325 MG tablet Take 1 tablet by mouth twice a day as needed for pain or three times daily as needed 07/15/16   [provider]  omega-3 acid ethyl esters (LOVAZA) 1 g capsule Take by mouth. 08/14/15   [provider]  RISPERIDONE PO Take by mouth 2 (two) times daily.    [provider]     Family History  Problem Relation Age of Onset  . Colon cancer Mother        late 38s  . Schizophrenia Sister   . Colon cancer Sister        late 56 s    Social History   Social History  . Marital status: Married    Spouse name: N/A  . Number of children: 1  . Years of education: N/A   Occupational History  . Retired    Social History Main Topics  . Smoking status: Never Smoker  . Smokeless tobacco: Never Used  . Alcohol use No  . Drug use: No  . Sexual activity: No   Other Topics Concern  . Not on file   Social History Narrative  . No narrative on file      Review of Systems currently denies fever,  headache, chest pain, dyspnea, cough, abdominal/back pain, nausea, vomiting. She has had some recent vaginal bleeding.  Vital Signs: BP (!) 143/87 (BP Location: Right Arm)   Pulse 92   Temp 98.2 F (36.8 C) (Oral)   Resp 16   SpO2 97%   Physical Exam awake, alert. Chest with slightly diminished breath sounds right base, left clear. Heart regular rate and rhythm. Abdomen obese, soft, positive bowel sounds, nontender. Extremities with full range of motion.  Mallampati Score:     Imaging: Ct Chest W Contrast  Result Date: 09/04/2016 CLINICAL DATA:  66 year old female with history of endometrial carcinoma diagnosed in May 2018. Evaluate for metastatic disease to the chest. EXAM: CT CHEST WITH CONTRAST TECHNIQUE: Multidetector CT imaging of the chest was performed during intravenous contrast administration. CONTRAST:  53mL ISOVUE-300 IOPAMIDOL (ISOVUE-300) INJECTION 61% COMPARISON:  No priors. FINDINGS: Cardiovascular: Heart size is mildly enlarged. There is no significant pericardial fluid, thickening or pericardial calcification. No significant atherosclerotic disease identified in the thoracic aorta. No coronary artery calcifications. Mediastinum/Nodes: No pathologically enlarged mediastinal or hilar lymph nodes. Esophagus is unremarkable in appearance. No axillary lymphadenopathy. Lungs/Pleura: Trace right pleural effusions lying dependently. No left pleural effusion. 4 mm nodule in the anterior aspect of the right upper lobe (image 54 of series 5). No other larger more suspicious appearing  pulmonary nodules or masses are noted. Linear scarring in the inferior segment of the lingula. No acute consolidative airspace disease. No pleural effusions. Upper Abdomen: Unremarkable. Musculoskeletal: There are no aggressive appearing lytic or blastic lesions noted in the visualized portions of the skeleton. IMPRESSION: 1. 4 mm nodule in the anterior aspect of the right upper lobe. This is highly nonspecific.  Although metastatic disease is not excluded, it is not strongly favored. Attention under routine followup examinations is recommended to ensure the stability or resolution of this finding. 2. Trace right pleural effusion lying dependently is simple in appearance. 3. Mild cardiomegaly. Electronically Signed   By: Vinnie Langton M.D.   On: 09/04/2016 16:23    Labs:  CBC:  Recent Labs  08/02/16 1251 08/28/16 1456  WBC 12.8* 10.1  HGB 11.7* 11.6  HCT 36.2 36.3  PLT 686* 466*    COAGS: No results for input(s): INR, APTT in the last 8760 hours.  BMP:  Recent Labs  08/02/16 1251 08/28/16 1456  NA 135 139  K 3.6 4.1  CL 96*  --   CO2 26 27  GLUCOSE 131* 99  BUN 15 17.6  CALCIUM 9.4 9.6  CREATININE 1.14* 1.2*  GFRNONAA 49*  --   GFRAA 56*  --     LIVER FUNCTION TESTS:  Recent Labs  08/02/16 1251 08/28/16 1456  BILITOT 0.5 1.11  AST 23 23  ALT 12* 13  ALKPHOS 89 97  PROT 9.0* 8.6*  ALBUMIN 3.6 3.2*    TUMOR MARKERS: No results for input(s): AFPTM, CEA, CA199, CHROMGRNA in the last 8760 hours.  Assessment and Plan: 66 y.o. female with history of newly diagnosed endometrial carcinoma who presents today for Port-A-Cath placement for chemotherapy.Risks and benefits discussed with the patient including, but not limited to bleeding, infection, pneumothorax, or fibrin sheath development and need for additional procedures. All of the patient's questions were answered, patient is agreeable to proceed. Consent signed and in chart.      Thank you for this interesting consult.  I greatly enjoyed meeting Emily Duarte and look forward to participating in their care.  A copy of this report was sent to the requesting provider on this date.  Electronically Signed: D. Nash Mantis 09/05/2016, 10:45 AM   I spent a total of 20 minutes  in face to face in clinical consultation, greater than 50% of which was counseling/coordinating care for Port-A-Cath  placement

## 2016-09-05 NOTE — Sedation Documentation (Signed)
Patient is resting comfortably. 

## 2016-09-05 NOTE — Progress Notes (Addendum)
Patient here for pre op IR. Patient stated she thought she was here for a cat scan. She states was not aware of a portacath placement today. Granddaughter with patient today (her son is coming shortly). Patient said she ate an apple this am at 0700 (as she told radiology at check in) then she told me she remembered she did NOT have an apple this am and her last food was at 1800 last night. Spoke with adult granddaughter and she said she or her dad will be with patient for 24 hours post. Patient agreeable for me to start with pre op labs and iv. Patient with history of paranoid schizophrenia and recently was admitted for sucidial thoughts but states none today.

## 2016-09-05 NOTE — Progress Notes (Signed)
Per Dr. Alvy Bimler at [facility] labs ordered to be drawn today while patient is here pre procedure for IR procedure. Were able to see labs ordered by Dr. Alvy Bimler (CBC, CMP, CA125) but not able to release them. Reentered orders above. Cancelled BMP ordered by IR pre procedure because CMP ordered for today.

## 2016-09-05 NOTE — Discharge Instructions (Signed)
In 24 hours may remove bandage and shower. Pat dry gently.        Implanted Port Insertion, Care After This sheet gives you information about how to care for yourself after your procedure. Your health care provider may also give you more specific instructions. If you have problems or questions, contact your health care provider. What can I expect after the procedure? After your procedure, it is common to have:  Discomfort at the port insertion site.  Bruising on the skin over the port. This should improve over 3-4 days. Follow these instructions at home: [facility] care   After your port is placed, you will get a manufacturer's information card. The card has information about your port. Keep this card with you at all times.  Take care of the port as told by your health care provider. Ask your health care provider if you or a family member can get training for taking care of the port at home. A home health care nurse may also take care of the port.  Make sure to remember what type of port you have. Incision care   Follow instructions from your health care provider about how to take care of your port insertion site. Make sure you:  Wash your hands with soap and water before you change your bandage (dressing). If soap and water are not available, use hand sanitizer.  Change your dressing as told by your health care provider.  Leave stitches (sutures), skin glue, or adhesive strips in place. These skin closures may need to stay in place for 2 weeks or longer. If adhesive strip edges start to loosen and curl up, you may trim the loose edges. Do not remove adhesive strips completely unless your health care provider tells you to do that.  Check your port insertion site every day for signs of infection. Check for:  More redness, swelling, or pain.  More fluid or blood.  Warmth.  Pus or a bad smell. General instructions   Do not take baths, swim, or use a hot tub until your health care  provider approves.  Do not lift anything that is heavier than 10 lb (4.5 kg) for a week, or as told by your health care provider.  Ask your health care provider when it is okay to:  Return to work or school.  Resume usual physical activities or sports.  Do not drive for 24 hours if you were given a medicine to help you relax (sedative).  Take over-the-counter and prescription medicines only as told by your health care provider.  Wear a medical alert bracelet in case of an emergency. This will tell any health care providers that you have a port.  Keep all follow-up visits as told by your health care provider. This is important. Contact a health care provider if:  You cannot flush your port with saline as directed, or you cannot draw blood from the port.  You have a fever or chills.  You have more redness, swelling, or pain around your port insertion site.  You have more fluid or blood coming from your port insertion site.  Your port insertion site feels warm to the touch.  You have pus or a bad smell coming from the port insertion site. Get help right away if:  You have chest pain or shortness of breath.  You have bleeding from your port that you cannot control. Summary  Take care of the port as told by your health care provider.  Change your  dressing as told by your health care provider.  Keep all follow-up visits as told by your health care provider. This information is not intended to replace advice given to you by your health care provider. Make sure you discuss any questions you have with your health care provider. Document Released: 01/27/2013 Document Revised: 02/28/2016 Document Reviewed: 02/28/2016 Elsevier Interactive Patient Education  2017 [employer] Moderate Conscious Sedation, Adult Sedation is the use of medicines to promote relaxation and relieve discomfort and anxiety. Moderate conscious sedation is a type of sedation. Under moderate conscious  sedation, you are less alert than normal, but you are still able to respond to instructions, touch, or both. Moderate conscious sedation is used during short medical and dental procedures. It is milder than deep sedation, which is a type of sedation under which you cannot be easily woken up. It is also milder than general anesthesia, which is the use of medicines to make you unconscious. Moderate conscious sedation allows you to return to your regular activities sooner. Tell a health care provider about:  Any allergies you have.  All medicines you are taking, including vitamins, herbs, eye drops, creams, and over-the-counter medicines.  Use of steroids (by mouth or creams).  Any problems you or family members have had with sedatives and anesthetic medicines.  Any blood disorders you have.  Any surgeries you have had.  Any medical conditions you have, such as sleep apnea.  Whether you are pregnant or may be pregnant.  Any use of cigarettes, alcohol, marijuana, or street drugs. What are the risks? Generally, this is a safe procedure. However, problems may occur, including:  Getting too much medicine (oversedation).  Nausea.  Allergic reaction to medicines.  Trouble breathing. If this happens, a breathing tube may be used to help with breathing. It will be removed when you are awake and breathing on your own.  Heart trouble.  Lung trouble. What happens before the procedure? Staying hydrated  Follow instructions from your health care provider about hydration, which may include:  Up to 2 hours before the procedure - you may continue to drink clear liquids, such as water, clear fruit juice, black coffee, and plain tea. Eating and drinking restrictions  Follow instructions from your health care provider about eating and drinking, which may include:  8 hours before the procedure - stop eating heavy meals or foods such as meat, fried foods, or fatty foods.  6 hours before the  procedure - stop eating light meals or foods, such as toast or cereal.  6 hours before the procedure - stop drinking milk or drinks that contain milk.  2 hours before the procedure - stop drinking clear liquids. Medicine   Ask your health care provider about:  Changing or stopping your regular medicines. This is especially important if you are taking diabetes medicines or blood thinners.  Taking medicines such as aspirin and ibuprofen. These medicines can thin your blood. Do not take these medicines before your procedure if your health care provider instructs you not to. Tests and exams   You will have a physical exam.  You may have blood tests done to show:  How well your kidneys and liver are working.  How well your blood can clot. General instructions   Plan to have someone take you home from the hospital or clinic.  If you will be going home right after the procedure, plan to have someone with you for 24 hours. What happens during the procedure?  An IV tube  will be inserted into one of your veins.  Medicine to help you relax (sedative) will be given through the IV tube.  The medical or dental procedure will be performed. What happens after the procedure?  Your blood pressure, heart rate, breathing rate, and blood oxygen level will be monitored often until the medicines you were given have worn off.  Do not drive for 24 hours. This information is not intended to replace advice given to you by your health care provider. Make sure you discuss any questions you have with your health care provider. Document Released: 01/01/2001 Document Revised: 09/12/2015 Document Reviewed: 07/29/2015 Elsevier Interactive Patient Education  2017 [employer].

## 2016-09-05 NOTE — Procedures (Signed)
Pre Procedure Dx: Endometrial Cancer Post Procedural Dx: Same  Successful placement of right IJ approach port-a-cath with tip at the superior caval atrial junction. The catheter is ready for immediate use.  Estimated Blood Loss: Minimal  Complications: None immediate.  Jay Gottlieb Zuercher, MD Pager #: [phone]   

## 2016-09-06 ENCOUNTER — Telehealth: Payer: Self-pay | Admitting: Hematology and Oncology

## 2016-09-06 ENCOUNTER — Encounter: Payer: Self-pay | Admitting: Hematology and Oncology

## 2016-09-06 ENCOUNTER — Other Ambulatory Visit: Payer: Medicare Other

## 2016-09-06 ENCOUNTER — Ambulatory Visit (HOSPITAL_BASED_OUTPATIENT_CLINIC_OR_DEPARTMENT_OTHER): Payer: Medicare Other | Admitting: Hematology and Oncology

## 2016-09-06 ENCOUNTER — Other Ambulatory Visit: Payer: Self-pay

## 2016-09-06 VITALS — BP 152/65 | HR 88 | Temp 97.8°F | Resp 18 | Ht 64.0 in | Wt 211.0 lb

## 2016-09-06 DIAGNOSIS — C541 Malignant neoplasm of endometrium: Secondary | ICD-10-CM

## 2016-09-06 DIAGNOSIS — R748 Abnormal levels of other serum enzymes: Secondary | ICD-10-CM

## 2016-09-06 DIAGNOSIS — F3163 Bipolar disorder, current episode mixed, severe, without psychotic features: Secondary | ICD-10-CM

## 2016-09-06 DIAGNOSIS — D638 Anemia in other chronic diseases classified elsewhere: Secondary | ICD-10-CM | POA: Insufficient documentation

## 2016-09-06 LAB — CA 125: CA 125: 1150 U/mL — ABNORMAL HIGH (ref 0.0–38.1)

## 2016-09-06 MED ORDER — LIDOCAINE-PRILOCAINE 2.5-2.5 % EX CREA: TOPICAL_CREAM | CUTANEOUS | 3 refills | Status: DC

## 2016-09-06 MED ORDER — DEXAMETHASONE 4 MG PO TABS: ORAL_TABLET | ORAL | 1 refills | Status: DC

## 2016-09-06 MED ORDER — ONDANSETRON HCL 8 MG PO TABS: 8.0000 mg | ORAL_TABLET | Freq: Two times a day (BID) | ORAL | 1 refills | Status: DC | PRN

## 2016-09-06 MED ORDER — PROCHLORPERAZINE MALEATE 10 MG PO TABS: 10.0000 mg | ORAL_TABLET | Freq: Four times a day (QID) | ORAL | 1 refills | Status: DC | PRN

## 2016-09-06 NOTE — Telephone Encounter (Signed)
Appointments scheduled per 09/06/16 los. Patient was given a copy of the AVS report and appointment schedule per 09/06/16 los.

## 2016-09-06 NOTE — Assessment & Plan Note (Signed)
The cause of her anemia is multifactorial, likely anemia chronic illness and recent vaginal bleeding She is not symptomatic We will monitor closely

## 2016-09-06 NOTE — Assessment & Plan Note (Signed)
Her mental health illness is stable. It is not clear to me that the patient has full understanding of the gravity of her situation Her son is a dedicated medical power of attorney

## 2016-09-06 NOTE — Assessment & Plan Note (Signed)
I reviewed the CT imaging with the patient and her son We discussed the goals of care is with the intent of eliminating all active cancer. I am willing to treat her with high-dose carboplatin and Taxol every 3 weeks with plan to repeat CT imaging after 3 cycles to determine the next course of action The risks, benefits, side effects of treatment including pancytopenia, allergic reaction, peripheral neuropathy, hair loss, nausea or vomiting, risk of infections and others were discussed with the patient and her son and they agreed to proceed Recommend premedication with dexamethasone, 40 mg 1 before treatment on Monday I will see her back prior to cycle 2 of treatment

## 2016-09-06 NOTE — Progress Notes (Signed)
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 However, she was transferred to [facility] on 08/06/16 for suicidal ideation. There, GYN was consulted and ordered biopsy. Pt was transferred to medical unit for blood transfusion.       08/10/2016 Imaging    US pelvis:  The uterus is prominent and heterogeneous measuring 10.9 x 6.3 x 6.1 cm. There is a probable anterior exophytic fundal fibroid measuring 4.5 cm. The endometrial stripe is normal in thickness at 6.4 mm. I see no fluid in the canal  Right ovary is not visible. Left ovary 2.3 x 1.7 x 1.9 cm with normal echogenicity. Color flow and spectral Doppler: Normal arterial inflow and venous outflow.  There is no significant free fluid.   I see no adnexal masses.      08/11/2016 - 08/15/2016 Hospital Admission    She was admitted voluntarily to Psychiatry unit 08/06/2016 for suicidal ideation.She was evaluated in an outside facility due to vaginal bleeding and endometrial and endocervical biopsies performed. Pathology revealed a poorly differentiated cancer with positive estrogen receptors felt to be suggestive of endometrial cancer. The patient received 2 units of prbcs total during her stay for anemia.        08/11/2016 Pathology Results       1. Endocervix, curettings and polyp. (*please see comment below). --High-grade carcinoma, undifferentiated.  2. Endometrial biopsy:(*please see comment below). --High-grade carcinoma, undifferentiated. --Weakly positive for estrogen receptors suggestive of endometrial origin; Negative for progesterone receptors and neutral mucin..       08/14/2016 Imaging    CT imaging 1. Uterine  mass and abnormality consistent with clinical history of endometrial carcinoma as described above with bilateral pelvic and retroperitoneal adenopathy consistent with nodal metastasis. 2. No evidence of nonnodal distant metastasis. 3. Nonspecific incidental finding of hypertrophy of the pylorus muscle. 4. incidental finding of cholelithiasis      09/04/2016 Imaging    CT chest: 1. 4 mm nodule in the anterior aspect of the right upper lobe. This is highly nonspecific. Although metastatic disease is not excluded, it is not strongly favored. Attention under routine followup examinations is recommended to ensure the stability or resolution of this finding. 2. Trace right pleural effusion lying dependently is simple in appearance. 3. Mild cardiomegaly.      09/05/2016 Procedure    Successful placement of a right internal jugular approach power injectable Port-A-Cath. The catheter is ready for immediate use.      09/05/2016 Tumor Marker    Patient's tumor was tested for the following markers: CA125 Results of the tumor marker test revealed 1150       INTERVAL HISTORY: Please see below for problem oriented charting. She returns with her son for chemotherapy consent prior to starting of treatment She denies vaginal bleeding She described recent event where she uses a chemical compound at home mixing bleach along with detergent to cleanse her vagina area She denies recent new medications  REVIEW OF SYSTEMS:   Constitutional: Denies fevers, chills or abnormal weight loss Eyes: Denies blurriness of vision Ears, nose, mouth, throat, and face: Denies mucositis or sore throat Respiratory: Denies cough, dyspnea or wheezes Cardiovascular: Denies palpitation, chest discomfort or lower extremity swelling Gastrointestinal:  Denies  nausea, heartburn or change in bowel habits Skin: Denies abnormal skin rashes Lymphatics: Denies new lymphadenopathy or easy bruising Neurological:Denies numbness, tingling  or new weaknesses Behavioral/Psych: Mood is stable, no new changes  All other systems were reviewed with the patient and are negative.  I have reviewed the past medical history, past surgical history, social history and family history with the patient and they are unchanged from previous note.  ALLERGIES:  has No Known Allergies.  MEDICATIONS:  Current Outpatient Prescriptions  Medication Sig Dispense Refill  . divalproex (DEPAKOTE ER) 500 MG 24 hr tablet Take by mouth daily.     Marland Kitchen HYDROcodone-acetaminophen (NORCO/VICODIN) 5-325 MG tablet Take 1 tablet by mouth twice a day as needed for pain or three times daily as needed    . ibuprofen (ADVIL,MOTRIN) 200 MG tablet Take 200 mg by mouth every 6 (six) hours as needed.    Marland Kitchen omega-3 acid ethyl esters (LOVAZA) 1 g capsule Take by mouth.    . dexamethasone (DECADRON) 4 MG tablet Take 10 tablets at 6 am on the day of chemo only, every 3 weeks 30 tablet 1  . lidocaine-prilocaine (EMLA) cream Apply to affected area once 30 g 3  . ondansetron (ZOFRAN) 8 MG tablet Take 1 tablet (8 mg total) by mouth 2 (two) times daily as needed for refractory nausea / vomiting. Start on day 3 after chemo. 30 tablet 1  . prochlorperazine (COMPAZINE) 10 MG tablet Take 1 tablet (10 mg total) by mouth every 6 (six) hours as needed (Nausea or vomiting). 30 tablet 1   No current facility-administered medications for this visit.     PHYSICAL EXAMINATION: ECOG PERFORMANCE STATUS: 1 - Symptomatic but completely ambulatory  Vitals:   09/06/16 1122  BP: (!) 152/65  Pulse: 88  Resp: 18  Temp: 97.8 F (36.6 C)   Filed Weights   09/06/16 1122  Weight: 211 lb (95.7 kg)    GENERAL:alert, no distress and comfortable.  Noted poor personal hygiene SKIN: skin color, texture, turgor are normal, no rashes or significant lesions EYES: normal, Conjunctiva are pink and non-injected, sclera clear OROPHARYNX:no exudate, no erythema and lips, buccal mucosa, and tongue normal   NECK: supple, thyroid normal size, non-tender, without nodularity LYMPH:  no palpable lymphadenopathy in the cervical, axillary or inguinal LUNGS: clear to auscultation and percussion with normal breathing effort HEART: regular rate & rhythm and no murmurs and no lower extremity edema ABDOMEN:abdomen soft, non-tender and normal bowel sounds Musculoskeletal:no cyanosis of digits and no clubbing  NEURO: alert & oriented x 3 with fluent speech, no focal motor/sensory deficits  LABORATORY DATA:  I have reviewed the data as listed    Component Value Date/Time   NA 139 09/05/2016 1026   NA 139 08/28/2016 1456   K 3.9 09/05/2016 1026   K 4.1 08/28/2016 1456   CL 106 09/05/2016 1026   CO2 24 09/05/2016 1026   CO2 27 08/28/2016 1456   GLUCOSE 94 09/05/2016 1026   GLUCOSE 99 08/28/2016 1456   BUN 13 09/05/2016 1026   BUN 17.6 08/28/2016 1456   CREATININE 0.90 09/05/2016 1026   CREATININE 1.2 (H) 08/28/2016 1456   CALCIUM 8.7 (L) 09/05/2016 1026   CALCIUM 9.6 08/28/2016 1456   PROT 7.3 09/05/2016 1026   PROT 8.6 (H) 08/28/2016 1456   ALBUMIN 3.0 (L) 09/05/2016 1026   ALBUMIN 3.2 (L) 08/28/2016 1456   AST 147 (H) 09/05/2016 1026   AST 23 08/28/2016 1456   ALT 88 (H) 09/05/2016  1026   ALT 13 08/28/2016 1456   ALKPHOS 335 (H) 09/05/2016 1026   ALKPHOS 97 08/28/2016 1456   BILITOT 0.6 09/05/2016 1026   BILITOT 1.11 08/28/2016 1456   GFRNONAA >60 09/05/2016 1026   GFRAA >60 09/05/2016 1026    No results found for: SPEP, UPEP  Lab Results  Component Value Date   WBC 6.6 09/05/2016   NEUTROABS 4.6 09/05/2016   HGB 9.8 (L) 09/05/2016   HCT 31.2 (L) 09/05/2016   MCV 82.8 09/05/2016   PLT 390 09/05/2016      Chemistry      Component Value Date/Time   NA 139 09/05/2016 1026   NA 139 08/28/2016 1456   K 3.9 09/05/2016 1026   K 4.1 08/28/2016 1456   CL 106 09/05/2016 1026   CO2 24 09/05/2016 1026   CO2 27 08/28/2016 1456   BUN 13 09/05/2016 1026   BUN 17.6 08/28/2016 1456    CREATININE 0.90 09/05/2016 1026   CREATININE 1.2 (H) 08/28/2016 1456      Component Value Date/Time   CALCIUM 8.7 (L) 09/05/2016 1026   CALCIUM 9.6 08/28/2016 1456   ALKPHOS 335 (H) 09/05/2016 1026   ALKPHOS 97 08/28/2016 1456   AST 147 (H) 09/05/2016 1026   AST 23 08/28/2016 1456   ALT 88 (H) 09/05/2016 1026   ALT 13 08/28/2016 1456   BILITOT 0.6 09/05/2016 1026   BILITOT 1.11 08/28/2016 1456       RADIOGRAPHIC STUDIES: I reviewed CT chest with the patient and her son I have personally reviewed the radiological images as listed and agreed with the findings in the report. Ct Chest W Contrast  Result Date: 09/04/2016 CLINICAL DATA:  66 year old female with history of endometrial carcinoma diagnosed in May 2018. Evaluate for metastatic disease to the chest. EXAM: CT CHEST WITH CONTRAST TECHNIQUE: Multidetector CT imaging of the chest was performed during intravenous contrast administration. CONTRAST:  73m ISOVUE-300 IOPAMIDOL (ISOVUE-300) INJECTION 61% COMPARISON:  No priors. FINDINGS: Cardiovascular: Heart size is mildly enlarged. There is no significant pericardial fluid, thickening or pericardial calcification. No significant atherosclerotic disease identified in the thoracic aorta. No coronary artery calcifications. Mediastinum/Nodes: No pathologically enlarged mediastinal or hilar lymph nodes. Esophagus is unremarkable in appearance. No axillary lymphadenopathy. Lungs/Pleura: Trace right pleural effusions lying dependently. No left pleural effusion. 4 mm nodule in the anterior aspect of the right upper lobe (image 54 of series 5). No other larger more suspicious appearing pulmonary nodules or masses are noted. Linear scarring in the inferior segment of the lingula. No acute consolidative airspace disease. No pleural effusions. Upper Abdomen: Unremarkable. Musculoskeletal: There are no aggressive appearing lytic or blastic lesions noted in the visualized portions of the skeleton.  IMPRESSION: 1. 4 mm nodule in the anterior aspect of the right upper lobe. This is highly nonspecific. Although metastatic disease is not excluded, it is not strongly favored. Attention under routine followup examinations is recommended to ensure the stability or resolution of this finding. 2. Trace right pleural effusion lying dependently is simple in appearance. 3. Mild cardiomegaly. Electronically Signed   By: DVinnie LangtonM.D.   On: 09/04/2016 16:23   Ir UK[place]Guide Vasc Access Right  Result Date: 09/05/2016 INDICATION: History of endometrial cancer. In need of durable intravenous access for chemotherapy administration. EXAM: IMPLANTED PORT A CATH PLACEMENT WITH ULTRASOUND AND FLUOROSCOPIC GUIDANCE COMPARISON:  Chest CT - 09/04/2016 MEDICATIONS: Ancef 2 gm IV; The antibiotic was administered within an appropriate time interval prior to skin puncture.  ANESTHESIA/SEDATION: Moderate (conscious) sedation was employed during this procedure. A total of Versed 2 mg and Fentanyl 100 mcg was administered intravenously. Moderate Sedation Time: 24 minutes. The patient's level of consciousness and vital signs were monitored continuously by radiology nursing throughout the procedure under my direct supervision. CONTRAST:  None FLUOROSCOPY TIME:  1 minutes, 36 seconds (31 mGy) COMPLICATIONS: None immediate. PROCEDURE: The procedure, risks, benefits, and alternatives were explained to the patient. Questions regarding the procedure were encouraged and answered. The patient understands and consents to the procedure. The right neck and chest were prepped with chlorhexidine in a sterile fashion, and a sterile drape was applied covering the operative field. Maximum barrier sterile technique with sterile gowns and gloves were used for the procedure. A timeout was performed prior to the initiation of the procedure. Local anesthesia was provided with 1% lidocaine with epinephrine. After creating a small venotomy incision, a  micropuncture kit was utilized to access the internal jugular vein under direct, real-time ultrasound guidance. Ultrasound image documentation was performed. The microwire was kinked to measure appropriate catheter length. A subcutaneous port pocket was then created along the upper chest wall utilizing a combination of sharp and blunt dissection. The pocket was irrigated with sterile saline. A single lumen ISP power injectable port was chosen for placement. The 8 Fr catheter was tunneled from the port pocket site to the venotomy incision. The port was placed in the pocket. The external catheter was trimmed to appropriate length. At the venotomy, an 8 Fr peel-away sheath was placed over a guidewire under fluoroscopic guidance. The catheter was then placed through the sheath and the sheath was removed. Final catheter positioning was confirmed and documented with a fluoroscopic spot radiograph. The port was accessed with a Huber needle, aspirated and flushed with heparinized saline. The venotomy site was closed with an interrupted 4-0 Vicryl suture. The port pocket incision was closed with interrupted 2-0 Vicryl suture and the skin was opposed with a running subcuticular 4-0 Vicryl suture. Dermabond and Steri-strips were applied to both incisions. Dressings were placed. The patient tolerated the procedure well without immediate post procedural complication. FINDINGS: After catheter placement, the tip lies within the superior cavoatrial junction. The catheter aspirates and flushes normally and is ready for immediate use. IMPRESSION: Successful placement of a right internal jugular approach power injectable Port-A-Cath. The catheter is ready for immediate use. Electronically Signed   By: Sandi Mariscal M.D.   On: 09/05/2016 15:28   Ir Fluoro Guide Port Insertion Right  Result Date: 09/05/2016 INDICATION: History of endometrial cancer. In need of durable intravenous access for chemotherapy administration. EXAM: IMPLANTED  PORT A CATH PLACEMENT WITH ULTRASOUND AND FLUOROSCOPIC GUIDANCE COMPARISON:  Chest CT - 09/04/2016 MEDICATIONS: Ancef 2 gm IV; The antibiotic was administered within an appropriate time interval prior to skin puncture. ANESTHESIA/SEDATION: Moderate (conscious) sedation was employed during this procedure. A total of Versed 2 mg and Fentanyl 100 mcg was administered intravenously. Moderate Sedation Time: 24 minutes. The patient's level of consciousness and vital signs were monitored continuously by radiology nursing throughout the procedure under my direct supervision. CONTRAST:  None FLUOROSCOPY TIME:  1 minutes, 36 seconds (31 mGy) COMPLICATIONS: None immediate. PROCEDURE: The procedure, risks, benefits, and alternatives were explained to the patient. Questions regarding the procedure were encouraged and answered. The patient understands and consents to the procedure. The right neck and chest were prepped with chlorhexidine in a sterile fashion, and a sterile drape was applied covering the operative field. Maximum barrier sterile  technique with sterile gowns and gloves were used for the procedure. A timeout was performed prior to the initiation of the procedure. Local anesthesia was provided with 1% lidocaine with epinephrine. After creating a small venotomy incision, a micropuncture kit was utilized to access the internal jugular vein under direct, real-time ultrasound guidance. Ultrasound image documentation was performed. The microwire was kinked to measure appropriate catheter length. A subcutaneous port pocket was then created along the upper chest wall utilizing a combination of sharp and blunt dissection. The pocket was irrigated with sterile saline. A single lumen ISP power injectable port was chosen for placement. The 8 Fr catheter was tunneled from the port pocket site to the venotomy incision. The port was placed in the pocket. The external catheter was trimmed to appropriate length. At the venotomy, an 8  Fr peel-away sheath was placed over a guidewire under fluoroscopic guidance. The catheter was then placed through the sheath and the sheath was removed. Final catheter positioning was confirmed and documented with a fluoroscopic spot radiograph. The port was accessed with a Huber needle, aspirated and flushed with heparinized saline. The venotomy site was closed with an interrupted 4-0 Vicryl suture. The port pocket incision was closed with interrupted 2-0 Vicryl suture and the skin was opposed with a running subcuticular 4-0 Vicryl suture. Dermabond and Steri-strips were applied to both incisions. Dressings were placed. The patient tolerated the procedure well without immediate post procedural complication. FINDINGS: After catheter placement, the tip lies within the superior cavoatrial junction. The catheter aspirates and flushes normally and is ready for immediate use. IMPRESSION: Successful placement of a right internal jugular approach power injectable Port-A-Cath. The catheter is ready for immediate use. Electronically Signed   By: Sandi Mariscal M.D.   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 I am willing to treat her with high-dose carboplatin and Taxol every 3 weeks with plan to repeat CT imaging after 3 cycles to determine the next course of action The risks, benefits, side effects of treatment including pancytopenia, allergic reaction, peripheral neuropathy, hair loss, nausea or vomiting, risk of infections and others were discussed with the patient and her son and they agreed to proceed Recommend premedication with dexamethasone, 40 mg 1 before treatment on Monday I will see her back prior to cycle 2 of treatment  Elevated liver enzymes I have reviewed the recent blood work with the patient and her son There is no  good explanation of why she have abnormal liver enzymes Certainly, it could be due to rapid disease progression versus chemical exposure based on her recent history of douching I will monitored carefully I have also reviewed with the pharmacist to make sure that she does not require dose adjustment with the current treatment and I was notified that dose adjustment is not necessary due to normal bilirubin.  Bipolar 1 disorder, mixed, severe ([place]) Her mental health illness is stable. It is not clear to me that the patient has full understanding of the gravity of her situation Her son is a dedicated medical power of attorney  Anemia, chronic disease The cause of her anemia is multifactorial, likely anemia chronic illness and recent vaginal bleeding She is not symptomatic We will monitor closely   Orders Placed This Encounter  Procedures  . CBC with Differential    Standing  Status:   Standing    Number of Occurrences:   20    Standing Expiration Date:   09/07/2017  . Comprehensive metabolic panel    Standing Status:   Standing    Number of Occurrences:   20    Standing Expiration Date:   09/07/2017   All questions were answered. The patient knows to call the clinic with any problems, questions or concerns. No barriers to learning was detected. I spent 30 minutes counseling the patient face to face. The total time spent in the appointment was 40 minutes and more than 50% was on counseling and review of test results     Heath Lark, MD 09/06/2016 12:51 PM

## 2016-09-06 NOTE — Assessment & Plan Note (Signed)
I have reviewed the recent blood work with the patient and her son There is no good explanation of why she have abnormal liver enzymes Certainly, it could be due to rapid disease progression versus chemical exposure based on her recent history of douching I will monitored carefully I have also reviewed with the pharmacist to make sure that she does not require dose adjustment with the current treatment and I was notified that dose adjustment is not necessary due to normal bilirubin.

## 2016-09-09 ENCOUNTER — Ambulatory Visit (HOSPITAL_BASED_OUTPATIENT_CLINIC_OR_DEPARTMENT_OTHER): Payer: Medicare Other

## 2016-09-09 VITALS — BP 136/78 | HR 76 | Temp 97.9°F | Resp 18

## 2016-09-09 DIAGNOSIS — Z5111 Encounter for antineoplastic chemotherapy: Secondary | ICD-10-CM | POA: Diagnosis present

## 2016-09-09 DIAGNOSIS — C541 Malignant neoplasm of endometrium: Secondary | ICD-10-CM | POA: Diagnosis not present

## 2016-09-09 MED ORDER — HEPARIN SOD (PORK) LOCK FLUSH 100 UNIT/ML IV SOLN
500.0000 [IU] | Freq: Once | INTRAVENOUS | Status: AC | PRN
  Administered 2016-09-09: 500 [IU]
  Filled 2016-09-09: qty 5

## 2016-09-09 MED ORDER — PALONOSETRON HCL INJECTION 0.25 MG/5ML
INTRAVENOUS | Status: AC
  Filled 2016-09-09: qty 5

## 2016-09-09 MED ORDER — SODIUM CHLORIDE 0.9% FLUSH
10.0000 mL | INTRAVENOUS | Status: DC | PRN
  Administered 2016-09-09: 10 mL
  Filled 2016-09-09: qty 10

## 2016-09-09 MED ORDER — DIPHENHYDRAMINE HCL 25 MG PO CAPS
ORAL_CAPSULE | ORAL | Status: AC
  Filled 2016-09-09: qty 2

## 2016-09-09 MED ORDER — PALONOSETRON HCL INJECTION 0.25 MG/5ML
0.2500 mg | Freq: Once | INTRAVENOUS | Status: AC
  Administered 2016-09-09: 0.25 mg via INTRAVENOUS

## 2016-09-09 MED ORDER — PACLITAXEL CHEMO INJECTION 300 MG/50ML
175.0000 mg/m2 | Freq: Once | INTRAVENOUS | Status: AC
  Administered 2016-09-09: 360 mg via INTRAVENOUS
  Filled 2016-09-09: qty 60

## 2016-09-09 MED ORDER — SODIUM CHLORIDE 0.9 % IV SOLN
Freq: Once | INTRAVENOUS | Status: AC
  Administered 2016-09-09: 08:00:00 via INTRAVENOUS

## 2016-09-09 MED ORDER — DIPHENHYDRAMINE HCL 50 MG/ML IJ SOLN
50.0000 mg | Freq: Once | INTRAMUSCULAR | Status: AC
  Administered 2016-09-09: 50 mg via INTRAVENOUS

## 2016-09-09 MED ORDER — DIPHENHYDRAMINE HCL 50 MG/ML IJ SOLN
INTRAMUSCULAR | Status: AC
  Filled 2016-09-09: qty 1

## 2016-09-09 MED ORDER — SODIUM CHLORIDE 0.9 % IV SOLN
20.0000 mg | Freq: Once | INTRAVENOUS | Status: AC
  Administered 2016-09-09: 20 mg via INTRAVENOUS
  Filled 2016-09-09: qty 2

## 2016-09-09 MED ORDER — FAMOTIDINE IN NACL 20-0.9 MG/50ML-% IV SOLN
20.0000 mg | Freq: Once | INTRAVENOUS | Status: AC
  Administered 2016-09-09: 20 mg via INTRAVENOUS

## 2016-09-09 MED ORDER — CARBOPLATIN CHEMO INJECTION 600 MG/60ML
641.4000 mg | Freq: Once | INTRAVENOUS | Status: AC
  Administered 2016-09-09: 640 mg via INTRAVENOUS
  Filled 2016-09-09: qty 64

## 2016-09-09 MED ORDER — FAMOTIDINE IN NACL 20-0.9 MG/50ML-% IV SOLN
INTRAVENOUS | Status: AC
  Filled 2016-09-09: qty 50

## 2016-09-09 NOTE — Patient Instructions (Signed)
[facility] Discharge Instructions for Patients Receiving Chemotherapy  Today you received the following chemotherapy agents Carboplatin & Paclitaxel (Taxol)   To help prevent nausea and vomiting after your treatment, we encourage you to take your nausea medication as prescribed.   If you develop nausea and vomiting that is not controlled by your nausea medication, call the clinic.   BELOW ARE SYMPTOMS THAT SHOULD BE REPORTED IMMEDIATELY:  *FEVER GREATER THAN 100.5 F  *CHILLS WITH OR WITHOUT FEVER  NAUSEA AND VOMITING THAT IS NOT CONTROLLED WITH YOUR NAUSEA MEDICATION  *UNUSUAL SHORTNESS OF BREATH  *UNUSUAL BRUISING OR BLEEDING  TENDERNESS IN MOUTH AND THROAT WITH OR WITHOUT PRESENCE OF ULCERS  *URINARY PROBLEMS  *BOWEL PROBLEMS  UNUSUAL RASH Items with * indicate a potential emergency and should be followed up as soon as possible.  Feel free to call the clinic you have any questions or concerns. The clinic phone number is [phone].  Please show the [facility] at check-in to the Emergency Department and triage nurse.  Carboplatin injection What is this medicine? CARBOPLATIN (KAR boe pla tin) is a chemotherapy drug. It targets fast dividing cells, like cancer cells, and causes these cells to die. This medicine is used to treat ovarian cancer and many other cancers. This medicine may be used for other purposes; ask your health care provider or pharmacist if you have questions. COMMON BRAND NAME(S): Paraplatin What should I tell my health care provider before I take this medicine? They need to know if you have any of these conditions: -blood disorders -hearing problems -kidney disease -recent or ongoing radiation therapy -an unusual or allergic reaction to carboplatin, cisplatin, other chemotherapy, other medicines, foods, dyes, or preservatives -pregnant or trying to get pregnant -breast-feeding How should I use this medicine? This drug is  usually given as an infusion into a vein. It is administered in a hospital or clinic by a specially trained health care professional. Talk to your pediatrician regarding the use of this medicine in children. Special care may be needed. Overdosage: If you think you have taken too much of this medicine contact a poison control center or emergency room at once. NOTE: This medicine is only for you. Do not share this medicine with others. What if I miss a dose? It is important not to miss a dose. Call your doctor or health care professional if you are unable to keep an appointment. What may interact with this medicine? -medicines for seizures -medicines to increase blood counts like filgrastim, pegfilgrastim, sargramostim -some antibiotics like amikacin, gentamicin, neomycin, streptomycin, tobramycin -vaccines Talk to your doctor or health care professional before taking any of these medicines: -acetaminophen -aspirin -ibuprofen -ketoprofen -naproxen This list may not describe all possible interactions. Give your health care provider a list of all the medicines, herbs, non-prescription drugs, or dietary supplements you use. Also tell them if you smoke, drink alcohol, or use illegal drugs. Some items may interact with your medicine. What should I watch for while using this medicine? Your condition will be monitored carefully while you are receiving this medicine. You will need important blood work done while you are taking this medicine. This drug may make you feel generally unwell. This is not uncommon, as chemotherapy can affect healthy cells as well as cancer cells. Report any side effects. Continue your course of treatment even though you feel ill unless your doctor tells you to stop. In some cases, you may be given additional medicines to help with side  effects. Follow all directions for their use. Call your doctor or health care professional for advice if you get a fever, chills or sore throat,  or other symptoms of a cold or flu. Do not treat yourself. This drug decreases your body's ability to fight infections. Try to avoid being around people who are sick. This medicine may increase your risk to bruise or bleed. Call your doctor or health care professional if you notice any unusual bleeding. Be careful brushing and flossing your teeth or using a toothpick because you may get an infection or bleed more easily. If you have any dental work done, tell your dentist you are receiving this medicine. Avoid taking products that contain aspirin, acetaminophen, ibuprofen, naproxen, or ketoprofen unless instructed by your doctor. These medicines may hide a fever. Do not become pregnant while taking this medicine. Women should inform their doctor if they wish to become pregnant or think they might be pregnant. There is a potential for serious side effects to an unborn child. Talk to your health care professional or pharmacist for more information. Do not breast-feed an infant while taking this medicine. What side effects may I notice from receiving this medicine? Side effects that you should report to your doctor or health care professional as soon as possible: -allergic reactions like skin rash, itching or hives, swelling of the face, lips, or tongue -signs of infection - fever or chills, cough, sore throat, pain or difficulty passing urine -signs of decreased platelets or bleeding - bruising, pinpoint red spots on the skin, black, tarry stools, nosebleeds -signs of decreased red blood cells - unusually weak or tired, fainting spells, lightheadedness -breathing problems -changes in hearing -changes in vision -chest pain -high blood pressure -low blood counts - This drug may decrease the number of white blood cells, red blood cells and platelets. You may be at increased risk for infections and bleeding. -nausea and vomiting -pain, swelling, redness or irritation at the injection site -pain,  tingling, numbness in the hands or feet -problems with balance, talking, walking -trouble passing urine or change in the amount of urine Side effects that usually do not require medical attention (report to your doctor or health care professional if they continue or are bothersome): -hair loss -loss of appetite -metallic taste in the mouth or changes in taste This list may not describe all possible side effects. Call your doctor for medical advice about side effects. You may report side effects to FDA at 1-800-FDA-1088. Where should I keep my medicine? This drug is given in a hospital or clinic and will not be stored at home. NOTE: This sheet is a summary. It may not cover all possible information. If you have questions about this medicine, talk to your doctor, pharmacist, or health care provider.  2018 [employer] Standard (2007-07-14 14:38:05)   Paclitaxel injection What is this medicine? PACLITAXEL (PAK li TAX el) is a chemotherapy drug. It targets fast dividing cells, like cancer cells, and causes these cells to die. This medicine is used to treat ovarian cancer, breast cancer, and other cancers. This medicine may be used for other purposes; ask your health care provider or pharmacist if you have questions. COMMON BRAND NAME(S): Onxol, Taxol What should I tell my health care provider before I take this medicine? They need to know if you have any of these conditions: -blood disorders -irregular heartbeat -infection (especially a virus infection such as chickenpox, cold sores, or herpes) -liver disease -previous or ongoing radiation therapy -an unusual or  allergic reaction to paclitaxel, alcohol, polyoxyethylated castor oil, other chemotherapy agents, other medicines, foods, dyes, or preservatives -pregnant or trying to get pregnant -breast-feeding How should I use this medicine? This drug is given as an infusion into a vein. It is administered in a hospital or clinic by a  specially trained health care professional. Talk to your pediatrician regarding the use of this medicine in children. Special care may be needed. Overdosage: If you think you have taken too much of this medicine contact a poison control center or emergency room at once. NOTE: This medicine is only for you. Do not share this medicine with others. What if I miss a dose? It is important not to miss your dose. Call your doctor or health care professional if you are unable to keep an appointment. What may interact with this medicine? Do not take this medicine with any of the following medications: -disulfiram -metronidazole This medicine may also interact with the following medications: -cyclosporine -diazepam -ketoconazole -medicines to increase blood counts like filgrastim, pegfilgrastim, sargramostim -other chemotherapy drugs like cisplatin, doxorubicin, epirubicin, etoposide, teniposide, vincristine -quinidine -testosterone -vaccines -verapamil Talk to your doctor or health care professional before taking any of these medicines: -acetaminophen -aspirin -ibuprofen -ketoprofen -naproxen This list may not describe all possible interactions. Give your health care provider a list of all the medicines, herbs, non-prescription drugs, or dietary supplements you use. Also tell them if you smoke, drink alcohol, or use illegal drugs. Some items may interact with your medicine. What should I watch for while using this medicine? Your condition will be monitored carefully while you are receiving this medicine. You will need important blood work done while you are taking this medicine. This medicine can cause serious allergic reactions. To reduce your risk you will need to take other medicine(s) before treatment with this medicine. If you experience allergic reactions like skin rash, itching or hives, swelling of the face, lips, or tongue, tell your doctor or health care professional right away. In  some cases, you may be given additional medicines to help with side effects. Follow all directions for their use. This drug may make you feel generally unwell. This is not uncommon, as chemotherapy can affect healthy cells as well as cancer cells. Report any side effects. Continue your course of treatment even though you feel ill unless your doctor tells you to stop. Call your doctor or health care professional for advice if you get a fever, chills or sore throat, or other symptoms of a cold or flu. Do not treat yourself. This drug decreases your body's ability to fight infections. Try to avoid being around people who are sick. This medicine may increase your risk to bruise or bleed. Call your doctor or health care professional if you notice any unusual bleeding. Be careful brushing and flossing your teeth or using a toothpick because you may get an infection or bleed more easily. If you have any dental work done, tell your dentist you are receiving this medicine. Avoid taking products that contain aspirin, acetaminophen, ibuprofen, naproxen, or ketoprofen unless instructed by your doctor. These medicines may hide a fever. Do not become pregnant while taking this medicine. Women should inform their doctor if they wish to become pregnant or think they might be pregnant. There is a potential for serious side effects to an unborn child. Talk to your health care professional or pharmacist for more information. Do not breast-feed an infant while taking this medicine. Men are advised not to father a child  while receiving this medicine. This product may contain alcohol. Ask your pharmacist or healthcare provider if this medicine contains alcohol. Be sure to tell all healthcare providers you are taking this medicine. Certain medicines, like metronidazole and disulfiram, can cause an unpleasant reaction when taken with alcohol. The reaction includes flushing, headache, nausea, vomiting, sweating, and increased  thirst. The reaction can last from 30 minutes to several hours. What side effects may I notice from receiving this medicine? Side effects that you should report to your doctor or health care professional as soon as possible: -allergic reactions like skin rash, itching or hives, swelling of the face, lips, or tongue -low blood counts - This drug may decrease the number of white blood cells, red blood cells and platelets. You may be at increased risk for infections and bleeding. -signs of infection - fever or chills, cough, sore throat, pain or difficulty passing urine -signs of decreased platelets or bleeding - bruising, pinpoint red spots on the skin, black, tarry stools, nosebleeds -signs of decreased red blood cells - unusually weak or tired, fainting spells, lightheadedness -breathing problems -chest pain -high or low blood pressure -mouth sores -nausea and vomiting -pain, swelling, redness or irritation at the injection site -pain, tingling, numbness in the hands or feet -slow or irregular heartbeat -swelling of the ankle, feet, hands Side effects that usually do not require medical attention (report to your doctor or health care professional if they continue or are bothersome): -bone pain -complete hair loss including hair on your head, underarms, pubic hair, eyebrows, and eyelashes -changes in the color of fingernails -diarrhea -loosening of the fingernails -loss of appetite -muscle or joint pain -red flush to skin -sweating This list may not describe all possible side effects. Call your doctor for medical advice about side effects. You may report side effects to FDA at 1-800-FDA-1088. Where should I keep my medicine? This drug is given in a hospital or clinic and will not be stored at home. NOTE: This sheet is a summary. It may not cover all possible information. If you have questions about this medicine, talk to your doctor, pharmacist, or health care provider.  2018  [employer] Standard (2015-02-07 19:58:00)

## 2016-09-10 ENCOUNTER — Telehealth: Payer: Self-pay

## 2016-09-10 NOTE — Telephone Encounter (Signed)
Called to see how she was doing. Son answered phone and said she is doing well.

## 2016-09-10 NOTE — Telephone Encounter (Signed)
-----   Message from Thomasene Lot, RN sent at 09/09/2016 12:03 PM EDT ----- Regarding: Dr Alvy Bimler 1st tx f/u call 1st tx f/u call

## 2016-09-30 ENCOUNTER — Ambulatory Visit (HOSPITAL_BASED_OUTPATIENT_CLINIC_OR_DEPARTMENT_OTHER): Payer: Medicare Other | Admitting: Hematology and Oncology

## 2016-09-30 ENCOUNTER — Ambulatory Visit: Payer: Medicare Other

## 2016-09-30 ENCOUNTER — Encounter: Payer: Self-pay | Admitting: Hematology and Oncology

## 2016-09-30 ENCOUNTER — Ambulatory Visit (HOSPITAL_BASED_OUTPATIENT_CLINIC_OR_DEPARTMENT_OTHER): Payer: Medicare Other

## 2016-09-30 ENCOUNTER — Telehealth: Payer: Self-pay | Admitting: Hematology and Oncology

## 2016-09-30 ENCOUNTER — Other Ambulatory Visit (HOSPITAL_BASED_OUTPATIENT_CLINIC_OR_DEPARTMENT_OTHER): Payer: Medicare Other

## 2016-09-30 DIAGNOSIS — C541 Malignant neoplasm of endometrium: Secondary | ICD-10-CM

## 2016-09-30 DIAGNOSIS — R748 Abnormal levels of other serum enzymes: Secondary | ICD-10-CM | POA: Diagnosis not present

## 2016-09-30 DIAGNOSIS — Z5111 Encounter for antineoplastic chemotherapy: Secondary | ICD-10-CM

## 2016-09-30 DIAGNOSIS — D638 Anemia in other chronic diseases classified elsewhere: Secondary | ICD-10-CM

## 2016-09-30 DIAGNOSIS — Z95828 Presence of other vascular implants and grafts: Secondary | ICD-10-CM

## 2016-09-30 LAB — CBC WITH DIFFERENTIAL/PLATELET
BASO%: 0.7 % (ref 0.0–2.0)
Basophils Absolute: 0.1 10*3/uL (ref 0.0–0.1)
EOS ABS: 0.1 10*3/uL (ref 0.0–0.5)
EOS%: 0.9 % (ref 0.0–7.0)
HCT: 32.9 % — ABNORMAL LOW (ref 34.8–46.6)
HEMOGLOBIN: 10.7 g/dL — AB (ref 11.6–15.9)
LYMPH#: 1.6 10*3/uL (ref 0.9–3.3)
LYMPH%: 13.4 % — AB (ref 14.0–49.7)
MCH: 27.4 pg (ref 25.1–34.0)
MCHC: 32.6 g/dL (ref 31.5–36.0)
MCV: 84 fL (ref 79.5–101.0)
MONO#: 0.4 10*3/uL (ref 0.1–0.9)
MONO%: 3.6 % (ref 0.0–14.0)
NEUT%: 81.4 % — ABNORMAL HIGH (ref 38.4–76.8)
NEUTROS ABS: 9.6 10*3/uL — AB (ref 1.5–6.5)
PLATELETS: 365 10*3/uL (ref 145–400)
RBC: 3.92 10*6/uL (ref 3.70–5.45)
RDW: 20.6 % — AB (ref 11.2–14.5)
WBC: 11.8 10*3/uL — AB (ref 3.9–10.3)

## 2016-09-30 LAB — COMPREHENSIVE METABOLIC PANEL
ALBUMIN: 3.4 g/dL — AB (ref 3.5–5.0)
ALK PHOS: 118 U/L (ref 40–150)
ALT: 10 U/L (ref 0–55)
ANION GAP: 11 meq/L (ref 3–11)
AST: 15 U/L (ref 5–34)
BILIRUBIN TOTAL: 0.3 mg/dL (ref 0.20–1.20)
BUN: 13.6 mg/dL (ref 7.0–26.0)
CO2: 24 meq/L (ref 22–29)
CREATININE: 1 mg/dL (ref 0.6–1.1)
Calcium: 9.6 mg/dL (ref 8.4–10.4)
Chloride: 103 mEq/L (ref 98–109)
EGFR: 62 mL/min/{1.73_m2} — ABNORMAL LOW (ref >90)
Glucose: 99 mg/dl (ref 70–140)
Potassium: 4.5 mEq/L (ref 3.5–5.1)
Sodium: 138 mEq/L (ref 136–145)
TOTAL PROTEIN: 7.6 g/dL (ref 6.4–8.3)

## 2016-09-30 MED ORDER — FAMOTIDINE IN NACL 20-0.9 MG/50ML-% IV SOLN
INTRAVENOUS | Status: AC
  Filled 2016-09-30: qty 50

## 2016-09-30 MED ORDER — SODIUM CHLORIDE 0.9% FLUSH
10.0000 mL | INTRAVENOUS | Status: DC | PRN
  Administered 2016-09-30: 10 mL
  Filled 2016-09-30: qty 10

## 2016-09-30 MED ORDER — PACLITAXEL CHEMO INJECTION 300 MG/50ML
175.0000 mg/m2 | Freq: Once | INTRAVENOUS | Status: AC
  Administered 2016-09-30: 360 mg via INTRAVENOUS
  Filled 2016-09-30: qty 60

## 2016-09-30 MED ORDER — DIPHENHYDRAMINE HCL 50 MG/ML IJ SOLN
50.0000 mg | Freq: Once | INTRAMUSCULAR | Status: AC
  Administered 2016-09-30: 50 mg via INTRAVENOUS

## 2016-09-30 MED ORDER — SODIUM CHLORIDE 0.9% FLUSH
10.0000 mL | INTRAVENOUS | Status: DC | PRN
  Administered 2016-09-30: 10 mL via INTRAVENOUS
  Filled 2016-09-30: qty 10

## 2016-09-30 MED ORDER — DIPHENHYDRAMINE HCL 50 MG/ML IJ SOLN
INTRAMUSCULAR | Status: AC
  Filled 2016-09-30: qty 1

## 2016-09-30 MED ORDER — HEPARIN SOD (PORK) LOCK FLUSH 100 UNIT/ML IV SOLN
500.0000 [IU] | Freq: Once | INTRAVENOUS | Status: AC | PRN
  Administered 2016-09-30: 500 [IU]
  Filled 2016-09-30: qty 5

## 2016-09-30 MED ORDER — SODIUM CHLORIDE 0.9 % IV SOLN
641.4000 mg | Freq: Once | INTRAVENOUS | Status: AC
  Administered 2016-09-30: 640 mg via INTRAVENOUS
  Filled 2016-09-30: qty 64

## 2016-09-30 MED ORDER — PALONOSETRON HCL INJECTION 0.25 MG/5ML
0.2500 mg | Freq: Once | INTRAVENOUS | Status: AC
  Administered 2016-09-30: 0.25 mg via INTRAVENOUS

## 2016-09-30 MED ORDER — PALONOSETRON HCL INJECTION 0.25 MG/5ML
INTRAVENOUS | Status: AC
  Filled 2016-09-30: qty 5

## 2016-09-30 MED ORDER — SODIUM CHLORIDE 0.9 % IV SOLN
20.0000 mg | Freq: Once | INTRAVENOUS | Status: AC
  Administered 2016-09-30: 20 mg via INTRAVENOUS
  Filled 2016-09-30: qty 2

## 2016-09-30 MED ORDER — SODIUM CHLORIDE 0.9 % IV SOLN
Freq: Once | INTRAVENOUS | Status: AC
  Administered 2016-09-30: 11:00:00 via INTRAVENOUS

## 2016-09-30 MED ORDER — FAMOTIDINE IN NACL 20-0.9 MG/50ML-% IV SOLN
20.0000 mg | Freq: Once | INTRAVENOUS | Status: AC
  Administered 2016-09-30: 20 mg via INTRAVENOUS

## 2016-09-30 NOTE — Telephone Encounter (Signed)
Scheduled appt per 6/11 los - Gave patient AVS and calender per 6/11 los.  

## 2016-09-30 NOTE — Assessment & Plan Note (Signed)
The cause of her anemia is multifactorial, likely anemia chronic illness and recent vaginal bleeding She is not symptomatic We will monitor closely

## 2016-09-30 NOTE — Progress Notes (Signed)
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 However, she was transferred to [facility] on 08/06/16 for suicidal ideation. There, GYN was consulted and ordered biopsy. Pt was transferred to medical unit for blood transfusion.       08/10/2016 Imaging    US pelvis:  The uterus is prominent and heterogeneous measuring 10.9 x 6.3 x 6.1 cm. There is a probable anterior exophytic fundal fibroid measuring 4.5 cm. The endometrial stripe is normal in thickness at 6.4 mm. I see no fluid in the canal  Right ovary is not visible. Left ovary 2.3 x 1.7 x 1.9 cm with normal echogenicity. Color flow and spectral Doppler: Normal arterial inflow and venous outflow.  There is no significant free fluid.   I see no adnexal masses.      08/11/2016 - 08/15/2016 Hospital Admission    She was admitted voluntarily to Psychiatry unit 08/06/2016 for suicidal ideation.She was evaluated in an outside facility due to vaginal bleeding and endometrial and endocervical biopsies performed. Pathology revealed a poorly differentiated cancer with positive estrogen receptors felt to be suggestive of endometrial cancer. The patient received 2 units of prbcs total during her stay for anemia.        08/11/2016 Pathology Results       1. Endocervix, curettings and polyp. (*please see comment below). --High-grade carcinoma, undifferentiated.  2. Endometrial biopsy:(*please see comment below). --High-grade carcinoma, undifferentiated. --Weakly positive for estrogen receptors suggestive of endometrial origin; Negative for progesterone receptors and neutral mucin..       08/14/2016 Imaging    CT imaging 1. Uterine  mass and abnormality consistent with clinical history of endometrial carcinoma as described above with bilateral pelvic and retroperitoneal adenopathy consistent with nodal metastasis. 2. No evidence of nonnodal distant metastasis. 3. Nonspecific incidental finding of hypertrophy of the pylorus muscle. 4. incidental finding of cholelithiasis      09/04/2016 Imaging    CT chest: 1. 4 mm nodule in the anterior aspect of the right upper lobe. This is highly nonspecific. Although metastatic disease is not excluded, it is not strongly favored. Attention under routine followup examinations is recommended to ensure the stability or resolution of this finding. 2. Trace right pleural effusion lying dependently is simple in appearance. 3. Mild cardiomegaly.      09/05/2016 Procedure    Successful placement of a right internal jugular approach power injectable Port-A-Cath. The catheter is ready for immediate use.      09/05/2016 Tumor Marker    Patient's tumor was tested for the following markers: CA125 Results of the tumor marker test revealed 1150      09/09/2016 -  Chemotherapy    She received chemotherapy with carboplatin and Taxol       INTERVAL HISTORY: Please see below for problem oriented charting. She returns with her son prior to cycle 2 of chemotherapy She had no side effects from treatment No nausea, mucositis or peripheral neuropathy She denies recent vaginal bleeding.  Overall, she tolerated treatment very well  REVIEW OF SYSTEMS:   Constitutional: Denies fevers, chills or abnormal weight loss Eyes: Denies blurriness of vision Ears, nose, mouth, throat, and face: Denies mucositis or sore throat Respiratory: Denies cough, dyspnea or wheezes Cardiovascular:  Denies palpitation, chest discomfort or lower extremity swelling Gastrointestinal:  Denies nausea, heartburn or change in bowel habits Skin: Denies abnormal skin rashes Lymphatics: Denies new lymphadenopathy or easy  bruising Neurological:Denies numbness, tingling or new weaknesses Behavioral/Psych: Mood is stable, no new changes  All other systems were reviewed with the patient and are negative.  I have reviewed the past medical history, past surgical history, social history and family history with the patient and they are unchanged from previous note.  ALLERGIES:  has No Known Allergies.  MEDICATIONS:  Current Outpatient Prescriptions  Medication Sig Dispense Refill  . dexamethasone (DECADRON) 4 MG tablet Take 10 tablets at 6 am on the day of chemo only, every 3 weeks 30 tablet 1  . divalproex (DEPAKOTE ER) 500 MG 24 hr tablet Take by mouth daily.     Marland Kitchen HYDROcodone-acetaminophen (NORCO/VICODIN) 5-325 MG tablet Take 1 tablet by mouth twice a day as needed for pain or three times daily as needed    . ibuprofen (ADVIL,MOTRIN) 200 MG tablet Take 200 mg by mouth every 6 (six) hours as needed.    . lidocaine-prilocaine (EMLA) cream Apply to affected area once 30 g 3  . omega-3 acid ethyl esters (LOVAZA) 1 g capsule Take by mouth.    . ondansetron (ZOFRAN) 8 MG tablet Take 1 tablet (8 mg total) by mouth 2 (two) times daily as needed for refractory nausea / vomiting. Start on day 3 after chemo. 30 tablet 1  . prochlorperazine (COMPAZINE) 10 MG tablet Take 1 tablet (10 mg total) by mouth every 6 (six) hours as needed (Nausea or vomiting). 30 tablet 1   No current facility-administered medications for this visit.    Facility-Administered Medications Ordered in Other Visits  Medication Dose Route Frequency Provider Last Rate Last Dose  . CARBOplatin (PARAPLATIN) 640 mg in sodium chloride 0.9 % 250 mL chemo infusion  640 mg Intravenous Once Alvy Bimler, Nayelie Gionfriddo, MD      . PACLitaxel (TAXOL) 360 mg in dextrose 5 % 500 mL chemo infusion (> 69m/m2)  175 mg/m2 (Treatment Plan Recorded) Intravenous Once GHeath Lark MD 187 mL/hr at 09/30/16 1152 360 mg at 09/30/16 1152    PHYSICAL EXAMINATION: ECOG PERFORMANCE STATUS: 1 -  Symptomatic but completely ambulatory  Vitals:   09/30/16 1010  BP: 121/64  Pulse: 79  Resp: 20  Temp: 98.5 F (36.9 C)   Filed Weights   09/30/16 1010  Weight: 204 lb 8 oz (92.8 kg)    GENERAL:alert, no distress and comfortable SKIN: skin color, texture, turgor are normal, no rashes or significant lesions EYES: normal, Conjunctiva are pink and non-injected, sclera clear OROPHARYNX:no exudate, no erythema and lips, buccal mucosa, and tongue normal  NECK: supple, thyroid normal size, non-tender, without nodularity LYMPH:  no palpable lymphadenopathy in the cervical, axillary or inguinal LUNGS: clear to auscultation and percussion with normal breathing effort HEART: regular rate & rhythm and no murmurs and no lower extremity edema ABDOMEN:abdomen soft, non-tender and normal bowel sounds Musculoskeletal:no cyanosis of digits and no clubbing  NEURO: alert & oriented x 3 with fluent speech, no focal motor/sensory deficits  LABORATORY DATA:  I have reviewed the data as listed    Component Value Date/Time   NA 138 09/30/2016 0938   K 4.5 09/30/2016 0938   CL 106 09/05/2016 1026   CO2 24 09/30/2016 0938   GLUCOSE 99 09/30/2016 0938   BUN 13.6 09/30/2016 0938   CREATININE 1.0 09/30/2016 0938   CALCIUM 9.6 09/30/2016 04970  PROT 7.6 09/30/2016 0938   ALBUMIN 3.4 (L) 09/30/2016 0938   AST 15 09/30/2016 0938   ALT 10 09/30/2016 0938   ALKPHOS 118 09/30/2016 0938   BILITOT 0.30 09/30/2016 0938   GFRNONAA >60 09/05/2016 1026   GFRAA >60 09/05/2016 1026    No results found for: SPEP, UPEP  Lab Results  Component Value Date   WBC 11.8 (H) 09/30/2016   NEUTROABS 9.6 (H) 09/30/2016   HGB 10.7 (L) 09/30/2016   HCT 32.9 (L) 09/30/2016   MCV 84.0 09/30/2016   PLT 365 09/30/2016      Chemistry      Component Value Date/Time   NA 138 09/30/2016 0938   K 4.5 09/30/2016 0938   CL 106 09/05/2016 1026   CO2 24 09/30/2016 0938   BUN 13.6 09/30/2016 0938   CREATININE 1.0  09/30/2016 0938      Component Value Date/Time   CALCIUM 9.6 09/30/2016 0938   ALKPHOS 118 09/30/2016 0938   AST 15 09/30/2016 0938   ALT 10 09/30/2016 0938   BILITOT 0.30 09/30/2016 0938       RADIOGRAPHIC STUDIES: I have personally reviewed the radiological images as listed and agreed with the findings in the report. Ct Chest W Contrast  Result Date: 09/04/2016 CLINICAL DATA:  66 year old female with history of endometrial carcinoma diagnosed in May 2018. Evaluate for metastatic disease to the chest. EXAM: CT CHEST WITH CONTRAST TECHNIQUE: Multidetector CT imaging of the chest was performed during intravenous contrast administration. CONTRAST:  70m ISOVUE-300 IOPAMIDOL (ISOVUE-300) INJECTION 61% COMPARISON:  No priors. FINDINGS: Cardiovascular: Heart size is mildly enlarged. There is no significant pericardial fluid, thickening or pericardial calcification. No significant atherosclerotic disease identified in the thoracic aorta. No coronary artery calcifications. Mediastinum/Nodes: No pathologically enlarged mediastinal or hilar lymph nodes. Esophagus is unremarkable in appearance. No axillary lymphadenopathy. Lungs/Pleura: Trace right pleural effusions lying dependently. No left pleural effusion. 4 mm nodule in the anterior aspect of the right upper lobe (image 54 of series 5). No other larger more suspicious appearing pulmonary nodules or masses are noted. Linear scarring in the inferior segment of the lingula. No acute consolidative airspace disease. No pleural effusions. Upper Abdomen: Unremarkable. Musculoskeletal: There are no aggressive appearing lytic or blastic lesions noted in the visualized portions of the skeleton. IMPRESSION: 1. 4 mm nodule in the anterior aspect of the right upper lobe. This is highly nonspecific. Although metastatic disease is not excluded, it is not strongly favored. Attention under routine followup examinations is recommended to ensure the stability or resolution  of this finding. 2. Trace right pleural effusion lying dependently is simple in appearance. 3. Mild cardiomegaly. Electronically Signed   By: DVinnie LangtonM.D.   On: 09/04/2016 16:23   Ir UK[place]Guide Vasc Access Right  Result Date: 09/05/2016 INDICATION: History of endometrial cancer. In need of durable intravenous access for chemotherapy administration. EXAM: IMPLANTED PORT A CATH PLACEMENT WITH ULTRASOUND AND FLUOROSCOPIC GUIDANCE COMPARISON:  Chest CT - 09/04/2016 MEDICATIONS: Ancef 2 gm IV; The antibiotic was administered within an appropriate time interval prior to skin puncture. ANESTHESIA/SEDATION: Moderate (conscious) sedation was employed during this procedure. A total of Versed 2 mg and Fentanyl 100 mcg was administered intravenously. Moderate Sedation Time: 24 minutes. The patient's level of consciousness and vital signs were monitored continuously by radiology nursing throughout the procedure under my direct supervision. CONTRAST:  None FLUOROSCOPY TIME:  1 minutes, 36 seconds (31 mGy) COMPLICATIONS: None immediate. PROCEDURE: The procedure, risks, benefits, and alternatives  were explained to the patient. Questions regarding the procedure were encouraged and answered. The patient understands and consents to the procedure. The right neck and chest were prepped with chlorhexidine in a sterile fashion, and a sterile drape was applied covering the operative field. Maximum barrier sterile technique with sterile gowns and gloves were used for the procedure. A timeout was performed prior to the initiation of the procedure. Local anesthesia was provided with 1% lidocaine with epinephrine. After creating a small venotomy incision, a micropuncture kit was utilized to access the internal jugular vein under direct, real-time ultrasound guidance. Ultrasound image documentation was performed. The microwire was kinked to measure appropriate catheter length. A subcutaneous port pocket was then created along the  upper chest wall utilizing a combination of sharp and blunt dissection. The pocket was irrigated with sterile saline. A single lumen ISP power injectable port was chosen for placement. The 8 Fr catheter was tunneled from the port pocket site to the venotomy incision. The port was placed in the pocket. The external catheter was trimmed to appropriate length. At the venotomy, an 8 Fr peel-away sheath was placed over a guidewire under fluoroscopic guidance. The catheter was then placed through the sheath and the sheath was removed. Final catheter positioning was confirmed and documented with a fluoroscopic spot radiograph. The port was accessed with a Huber needle, aspirated and flushed with heparinized saline. The venotomy site was closed with an interrupted 4-0 Vicryl suture. The port pocket incision was closed with interrupted 2-0 Vicryl suture and the skin was opposed with a running subcuticular 4-0 Vicryl suture. Dermabond and Steri-strips were applied to both incisions. Dressings were placed. The patient tolerated the procedure well without immediate post procedural complication. FINDINGS: After catheter placement, the tip lies within the superior cavoatrial junction. The catheter aspirates and flushes normally and is ready for immediate use. IMPRESSION: Successful placement of a right internal jugular approach power injectable Port-A-Cath. The catheter is ready for immediate use. Electronically Signed   By: Sandi Mariscal M.D.   On: 09/05/2016 15:28   Ir Fluoro Guide Port Insertion Right  Result Date: 09/05/2016 INDICATION: History of endometrial cancer. In need of durable intravenous access for chemotherapy administration. EXAM: IMPLANTED PORT A CATH PLACEMENT WITH ULTRASOUND AND FLUOROSCOPIC GUIDANCE COMPARISON:  Chest CT - 09/04/2016 MEDICATIONS: Ancef 2 gm IV; The antibiotic was administered within an appropriate time interval prior to skin puncture. ANESTHESIA/SEDATION: Moderate (conscious) sedation was  employed during this procedure. A total of Versed 2 mg and Fentanyl 100 mcg was administered intravenously. Moderate Sedation Time: 24 minutes. The patient's level of consciousness and vital signs were monitored continuously by radiology nursing throughout the procedure under my direct supervision. CONTRAST:  None FLUOROSCOPY TIME:  1 minutes, 36 seconds (31 mGy) COMPLICATIONS: None immediate. PROCEDURE: The procedure, risks, benefits, and alternatives were explained to the patient. Questions regarding the procedure were encouraged and answered. The patient understands and consents to the procedure. The right neck and chest were prepped with chlorhexidine in a sterile fashion, and a sterile drape was applied covering the operative field. Maximum barrier sterile technique with sterile gowns and gloves were used for the procedure. A timeout was performed prior to the initiation of the procedure. Local anesthesia was provided with 1% lidocaine with epinephrine. After creating a small venotomy incision, a micropuncture kit was utilized to access the internal jugular vein under direct, real-time ultrasound guidance. Ultrasound image documentation was performed. The microwire was kinked to measure appropriate catheter length. A subcutaneous port  pocket was then created along the upper chest wall utilizing a combination of sharp and blunt dissection. The pocket was irrigated with sterile saline. A single lumen ISP power injectable port was chosen for placement. The 8 Fr catheter was tunneled from the port pocket site to the venotomy incision. The port was placed in the pocket. The external catheter was trimmed to appropriate length. At the venotomy, an 8 Fr peel-away sheath was placed over a guidewire under fluoroscopic guidance. The catheter was then placed through the sheath and the sheath was removed. Final catheter positioning was confirmed and documented with a fluoroscopic spot radiograph. The port was accessed with  a Huber needle, aspirated and flushed with heparinized saline. The venotomy site was closed with an interrupted 4-0 Vicryl suture. The port pocket incision was closed with interrupted 2-0 Vicryl suture and the skin was opposed with a running subcuticular 4-0 Vicryl suture. Dermabond and Steri-strips were applied to both incisions. Dressings were placed. The patient tolerated the procedure well without immediate post procedural complication. FINDINGS: After catheter placement, the tip lies within the superior cavoatrial junction. The catheter aspirates and flushes normally and is ready for immediate use. IMPRESSION: Successful placement of a right internal jugular approach power injectable Port-A-Cath. The catheter is ready for immediate use. Electronically Signed   By: Sandi Mariscal M.D.   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  I plan to give her 3 cycles of chemo before repeat imaging study  Anemia, chronic disease The cause of her anemia is multifactorial, likely anemia chronic illness and recent vaginal bleeding She is not symptomatic We will monitor closely  Elevated liver enzymes Her liver enzymes has improved since treatment. We will continue close monitoring   No orders of the defined types were placed in this encounter.  All questions were answered. The patient knows to call the clinic with any problems, questions or concerns. No barriers to learning was detected. I spent 15 minutes counseling the patient face to face. The total time spent in the appointment was 20 minutes and more than 50% was on counseling and review of test results     Heath Lark, MD 09/30/2016 12:34 PM

## 2016-09-30 NOTE — Assessment & Plan Note (Signed)
Her liver enzymes has improved since treatment. We will continue close monitoring

## 2016-09-30 NOTE — Assessment & Plan Note (Signed)
She tolerated chemotherapy well he well without major side effects She have improvement of blood count We will proceed with cycle 2 without dose adjustment Due to the upcoming holidays, she is willing to delay cycle 3 by 1 week before we resume.  I plan to give her 3 cycles of chemo before repeat imaging study

## 2016-09-30 NOTE — Patient Instructions (Signed)
[facility] Discharge Instructions for Patients Receiving Chemotherapy  Today you received the following chemotherapy agents Taxol/Carbo  To help prevent nausea and vomiting after your treatment, we encourage you to take your nausea medication as needed   If you develop nausea and vomiting that is not controlled by your nausea medication, call the clinic.   BELOW ARE SYMPTOMS THAT SHOULD BE REPORTED IMMEDIATELY:  *FEVER GREATER THAN 100.5 F  *CHILLS WITH OR WITHOUT FEVER  NAUSEA AND VOMITING THAT IS NOT CONTROLLED WITH YOUR NAUSEA MEDICATION  *UNUSUAL SHORTNESS OF BREATH  *UNUSUAL BRUISING OR BLEEDING  TENDERNESS IN MOUTH AND THROAT WITH OR WITHOUT PRESENCE OF ULCERS  *URINARY PROBLEMS  *BOWEL PROBLEMS  UNUSUAL RASH Items with * indicate a potential emergency and should be followed up as soon as possible.  Feel free to call the clinic you have any questions or concerns. The clinic phone number is [phone].  Please show the [place] at check-in to the Emergency Department and triage nurse.

## 2016-10-01 LAB — CA 125: Cancer Antigen (CA) 125: 338.1 U/mL — ABNORMAL HIGH (ref 0.0–38.1)

## 2016-10-14 ENCOUNTER — Encounter: Payer: Self-pay | Admitting: Gynecologic Oncology

## 2016-10-14 NOTE — Progress Notes (Unsigned)
Gynecologic Oncology Multi-Disciplinary Disposition Conference Note  Date of the Conference: October 14, 2016  Patient Name: Emily Duarte   Stage/Disposition:  High grade endometrial cancer.  Disposition is to Radiation therapy and chemotherapy. Recommendation is for patient to be seen by her GYN Oncologist (   This Multidisciplinary conference took place involving physicians from Gynecologic Oncology, Medical Oncology, Radiation Oncology, Pathology, Radiology along with the Gynecologic Oncology Nurse Practitioner and RN.  Comprehensive assessment of the patient's malignancy, staging, need for surgery, chemotherapy, radiation therapy, and need for further testing were reviewed. Supportive measures, both inpatient and following discharge were also discussed. The recommended plan of care is documented. Greater than 35 minutes were spent correlating and coordinating this patient's care.

## 2016-10-14 NOTE — Progress Notes (Signed)
Gynecologic Oncology Multi-Disciplinary Disposition Conference Note  Date of the Conference: October 14, 2016  Patient Name: Emily Duarte   Stage/Disposition:  High grade endometrial cancer.  Disposition is to radiation therapy and chemotherapy.  Recommendation is for patient to be seen by her GYN Oncologist (Dr. Rhodia Albright at [facility]) for evaluation of response to therapy. Recommend MSI testing if not performed already.  This Multidisciplinary conference took place involving physicians from [place], [place], Radiation Oncology, Pathology, Radiology along with the Gynecologic Oncology Nurse Practitioner and RN.  Comprehensive assessment of the patient's malignancy, staging, need for surgery, chemotherapy, radiation therapy, and need for further testing were reviewed. Supportive measures, both inpatient and following discharge were also discussed. The recommended plan of care is documented. Greater than 35 minutes were spent correlating and coordinating this patient's care.

## 2016-10-28 ENCOUNTER — Other Ambulatory Visit (HOSPITAL_BASED_OUTPATIENT_CLINIC_OR_DEPARTMENT_OTHER): Payer: Medicare Other

## 2016-10-28 ENCOUNTER — Ambulatory Visit (HOSPITAL_BASED_OUTPATIENT_CLINIC_OR_DEPARTMENT_OTHER): Payer: Medicare Other

## 2016-10-28 ENCOUNTER — Ambulatory Visit (HOSPITAL_BASED_OUTPATIENT_CLINIC_OR_DEPARTMENT_OTHER): Payer: Medicare Other | Admitting: Hematology and Oncology

## 2016-10-28 ENCOUNTER — Encounter: Payer: Self-pay | Admitting: Hematology and Oncology

## 2016-10-28 ENCOUNTER — Ambulatory Visit: Payer: Medicare Other

## 2016-10-28 ENCOUNTER — Telehealth: Payer: Self-pay | Admitting: Hematology and Oncology

## 2016-10-28 VITALS — BP 136/65 | HR 89 | Temp 98.6°F | Resp 18 | Ht 64.0 in | Wt 198.7 lb

## 2016-10-28 DIAGNOSIS — C541 Malignant neoplasm of endometrium: Secondary | ICD-10-CM

## 2016-10-28 DIAGNOSIS — Z5111 Encounter for antineoplastic chemotherapy: Secondary | ICD-10-CM

## 2016-10-28 DIAGNOSIS — D638 Anemia in other chronic diseases classified elsewhere: Secondary | ICD-10-CM

## 2016-10-28 LAB — COMPREHENSIVE METABOLIC PANEL
ALBUMIN: 3.4 g/dL — AB (ref 3.5–5.0)
ALK PHOS: 117 U/L (ref 40–150)
ALT: 10 U/L (ref 0–55)
AST: 15 U/L (ref 5–34)
Anion Gap: 8 mEq/L (ref 3–11)
BUN: 11 mg/dL (ref 7.0–26.0)
CHLORIDE: 104 meq/L (ref 98–109)
CO2: 26 mEq/L (ref 22–29)
Calcium: 9.7 mg/dL (ref 8.4–10.4)
Creatinine: 0.9 mg/dL (ref 0.6–1.1)
EGFR: 65 mL/min/{1.73_m2} — ABNORMAL LOW (ref >90)
GLUCOSE: 104 mg/dL (ref 70–140)
POTASSIUM: 4.4 meq/L (ref 3.5–5.1)
SODIUM: 138 meq/L (ref 136–145)
Total Bilirubin: 0.44 mg/dL (ref 0.20–1.20)
Total Protein: 7.6 g/dL (ref 6.4–8.3)

## 2016-10-28 LAB — CBC WITH DIFFERENTIAL/PLATELET
BASO%: 0.6 % (ref 0.0–2.0)
BASOS ABS: 0.1 10*3/uL (ref 0.0–0.1)
EOS%: 2.3 % (ref 0.0–7.0)
Eosinophils Absolute: 0.2 10*3/uL (ref 0.0–0.5)
HCT: 34 % — ABNORMAL LOW (ref 34.8–46.6)
HEMOGLOBIN: 11.3 g/dL — AB (ref 11.6–15.9)
LYMPH%: 11.2 % — ABNORMAL LOW (ref 14.0–49.7)
MCH: 28.1 pg (ref 25.1–34.0)
MCHC: 33.2 g/dL (ref 31.5–36.0)
MCV: 84.7 fL (ref 79.5–101.0)
MONO#: 0.3 10*3/uL (ref 0.1–0.9)
MONO%: 3.2 % (ref 0.0–14.0)
NEUT#: 8.3 10*3/uL — ABNORMAL HIGH (ref 1.5–6.5)
NEUT%: 82.7 % — ABNORMAL HIGH (ref 38.4–76.8)
Platelets: 392 10*3/uL (ref 145–400)
RBC: 4.01 10*6/uL (ref 3.70–5.45)
RDW: 19.1 % — AB (ref 11.2–14.5)
WBC: 10 10*3/uL (ref 3.9–10.3)
lymph#: 1.1 10*3/uL (ref 0.9–3.3)

## 2016-10-28 MED ORDER — PALONOSETRON HCL INJECTION 0.25 MG/5ML
INTRAVENOUS | Status: AC
  Filled 2016-10-28: qty 5

## 2016-10-28 MED ORDER — HEPARIN SOD (PORK) LOCK FLUSH 100 UNIT/ML IV SOLN
500.0000 [IU] | Freq: Once | INTRAVENOUS | Status: AC | PRN
  Administered 2016-10-28: 500 [IU]
  Filled 2016-10-28: qty 5

## 2016-10-28 MED ORDER — SODIUM CHLORIDE 0.9% FLUSH
10.0000 mL | INTRAVENOUS | Status: DC | PRN
  Administered 2016-10-28: 10 mL
  Filled 2016-10-28: qty 10

## 2016-10-28 MED ORDER — DIPHENHYDRAMINE HCL 50 MG/ML IJ SOLN
50.0000 mg | Freq: Once | INTRAMUSCULAR | Status: AC
  Administered 2016-10-28: 50 mg via INTRAVENOUS

## 2016-10-28 MED ORDER — DIPHENHYDRAMINE HCL 50 MG/ML IJ SOLN
INTRAMUSCULAR | Status: AC
  Filled 2016-10-28: qty 1

## 2016-10-28 MED ORDER — SODIUM CHLORIDE 0.9 % IV SOLN
641.4000 mg | Freq: Once | INTRAVENOUS | Status: AC
  Administered 2016-10-28: 640 mg via INTRAVENOUS
  Filled 2016-10-28: qty 64

## 2016-10-28 MED ORDER — FAMOTIDINE IN NACL 20-0.9 MG/50ML-% IV SOLN
INTRAVENOUS | Status: AC
  Filled 2016-10-28: qty 50

## 2016-10-28 MED ORDER — FAMOTIDINE IN NACL 20-0.9 MG/50ML-% IV SOLN
20.0000 mg | Freq: Once | INTRAVENOUS | Status: AC
  Administered 2016-10-28: 20 mg via INTRAVENOUS

## 2016-10-28 MED ORDER — DEXAMETHASONE SODIUM PHOSPHATE 100 MG/10ML IJ SOLN
20.0000 mg | Freq: Once | INTRAMUSCULAR | Status: AC
  Administered 2016-10-28: 20 mg via INTRAVENOUS
  Filled 2016-10-28: qty 2

## 2016-10-28 MED ORDER — DEXTROSE 5 % IV SOLN
175.0000 mg/m2 | Freq: Once | INTRAVENOUS | Status: AC
  Administered 2016-10-28: 360 mg via INTRAVENOUS
  Filled 2016-10-28: qty 60

## 2016-10-28 MED ORDER — PALONOSETRON HCL INJECTION 0.25 MG/5ML
0.2500 mg | Freq: Once | INTRAVENOUS | Status: AC
Start: 2016-10-28 — End: 2016-10-28
  Administered 2016-10-28: 0.25 mg via INTRAVENOUS

## 2016-10-28 MED ORDER — SODIUM CHLORIDE 0.9% FLUSH
10.0000 mL | Freq: Once | INTRAVENOUS | Status: AC
  Administered 2016-10-28: 10 mL
  Filled 2016-10-28: qty 10

## 2016-10-28 MED ORDER — SODIUM CHLORIDE 0.9 % IV SOLN
Freq: Once | INTRAVENOUS | Status: AC
  Administered 2016-10-28: 10:00:00 via INTRAVENOUS

## 2016-10-28 NOTE — Assessment & Plan Note (Signed)
She tolerated chemotherapy well he well without major side effects She have improvement of blood count We will proceed with cycle 3 without dose adjustment I plan to repeat imaging study after 3 cycles of treatment and will discuss with GI and surgery to see if surgery is appropriate She had no further vaginal bleeding

## 2016-10-28 NOTE — Progress Notes (Signed)
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 However, she was transferred to [facility] on 08/06/16 for suicidal ideation. There, GYN was consulted and ordered biopsy. Pt was transferred to medical unit for blood transfusion.       08/10/2016 Imaging    US pelvis:  The uterus is prominent and heterogeneous measuring 10.9 x 6.3 x 6.1 cm. There is a probable anterior exophytic fundal fibroid measuring 4.5 cm. The endometrial stripe is normal in thickness at 6.4 mm. I see no fluid in the canal  Right ovary is not visible. Left ovary 2.3 x 1.7 x 1.9 cm with normal echogenicity. Color flow and spectral Doppler: Normal arterial inflow and venous outflow.  There is no significant free fluid.   I see no adnexal masses.      08/11/2016 - 08/15/2016 Hospital Admission    She was admitted voluntarily to Psychiatry unit 08/06/2016 for suicidal ideation.She was evaluated in an outside facility due to vaginal bleeding and endometrial and endocervical biopsies performed. Pathology revealed a poorly differentiated cancer with positive estrogen receptors felt to be suggestive of endometrial cancer. The patient received 2 units of prbcs total during her stay for anemia.        08/11/2016 Pathology Results       1. Endocervix, curettings and polyp. (*please see comment below). --High-grade carcinoma, undifferentiated.  2. Endometrial biopsy:(*please see comment below). --High-grade carcinoma, undifferentiated. --Weakly positive for estrogen receptors suggestive of endometrial origin; Negative for progesterone receptors and neutral mucin..       08/14/2016 Imaging    CT imaging 1. Uterine  mass and abnormality consistent with clinical history of endometrial carcinoma as described above with bilateral pelvic and retroperitoneal adenopathy consistent with nodal metastasis. 2. No evidence of nonnodal distant metastasis. 3. Nonspecific incidental finding of hypertrophy of the pylorus muscle. 4. incidental finding of cholelithiasis      09/04/2016 Imaging    CT chest: 1. 4 mm nodule in the anterior aspect of the right upper lobe. This is highly nonspecific. Although metastatic disease is not excluded, it is not strongly favored. Attention under routine followup examinations is recommended to ensure the stability or resolution of this finding. 2. Trace right pleural effusion lying dependently is simple in appearance. 3. Mild cardiomegaly.      09/05/2016 Procedure    Successful placement of a right internal jugular approach power injectable Port-A-Cath. The catheter is ready for immediate use.      09/05/2016 Tumor Marker    Patient's tumor was tested for the following markers: CA125 Results of the tumor marker test revealed 1150      09/09/2016 -  Chemotherapy    She received chemotherapy with carboplatin and Taxol      09/30/2016 Tumor Marker    Patient's tumor was tested for the following markers: CA125 Results of the tumor marker test revealed 338.1       INTERVAL HISTORY: Please see below for problem oriented charting. She is seen for chemotherapy, prior to cycle 3 of treatment She tolerated treatment well Denies nausea, fatigue, peripheral neuropathy or side effects of treatment Her appetite is stable although she lost some weight Denies changes in bowel habits or abdominal bloating  REVIEW OF  SYSTEMS:   Constitutional: Denies fevers, chills or abnormal weight loss Eyes: Denies blurriness of vision Ears, nose, mouth, throat, and face: Denies mucositis or sore throat Respiratory: Denies cough, dyspnea or wheezes Cardiovascular: Denies palpitation, chest discomfort  or lower extremity swelling Gastrointestinal:  Denies nausea, heartburn or change in bowel habits Skin: Denies abnormal skin rashes Lymphatics: Denies new lymphadenopathy or easy bruising Neurological:Denies numbness, tingling or new weaknesses Behavioral/Psych: Mood is stable, no new changes  All other systems were reviewed with the patient and are negative.  I have reviewed the past medical history, past surgical history, social history and family history with the patient and they are unchanged from previous note.  ALLERGIES:  has No Known Allergies.  MEDICATIONS:  Current Outpatient Prescriptions  Medication Sig Dispense Refill  . dexamethasone (DECADRON) 4 MG tablet Take 10 tablets at 6 am on the day of chemo only, every 3 weeks 30 tablet 1  . divalproex (DEPAKOTE ER) 500 MG 24 hr tablet Take by mouth daily.     Marland Kitchen HYDROcodone-acetaminophen (NORCO/VICODIN) 5-325 MG tablet Take 1 tablet by mouth twice a day as needed for pain or three times daily as needed    . ibuprofen (ADVIL,MOTRIN) 200 MG tablet Take 200 mg by mouth every 6 (six) hours as needed.    . lidocaine-prilocaine (EMLA) cream Apply to affected area once 30 g 3  . omega-3 acid ethyl esters (LOVAZA) 1 g capsule Take by mouth.    . ondansetron (ZOFRAN) 8 MG tablet Take 1 tablet (8 mg total) by mouth 2 (two) times daily as needed for refractory nausea / vomiting. Start on day 3 after chemo. 30 tablet 1  . prochlorperazine (COMPAZINE) 10 MG tablet Take 1 tablet (10 mg total) by mouth every 6 (six) hours as needed (Nausea or vomiting). 30 tablet 1   No current facility-administered medications for this visit.    Facility-Administered Medications Ordered in Other Visits  Medication Dose Route Frequency Provider Last Rate Last Dose  . sodium chloride flush (NS) 0.9 % injection 10 mL  10 mL Intracatheter PRN Alvy Bimler, Herny Scurlock, MD   10 mL at 10/28/16 1426    PHYSICAL EXAMINATION: ECOG PERFORMANCE STATUS: 1 - Symptomatic but completely  ambulatory  Vitals:   10/28/16 0916  BP: 136/65  Pulse: 89  Resp: 18  Temp: 98.6 F (37 C)   Filed Weights   10/28/16 0916  Weight: 198 lb 11.2 oz (90.1 kg)    GENERAL:alert, no distress and comfortable SKIN: skin color, texture, turgor are normal, no rashes or significant lesions EYES: normal, Conjunctiva are pink and non-injected, sclera clear OROPHARYNX:no exudate, no erythema and lips, buccal mucosa, and tongue normal  NECK: supple, thyroid normal size, non-tender, without nodularity LYMPH:  no palpable lymphadenopathy in the cervical, axillary or inguinal LUNGS: clear to auscultation and percussion with normal breathing effort HEART: regular rate & rhythm and no murmurs and no lower extremity edema ABDOMEN:abdomen soft, non-tender and normal bowel sounds Musculoskeletal:no cyanosis of digits and no clubbing  NEURO: alert & oriented x 3 with fluent speech, no focal motor/sensory deficits  LABORATORY DATA:  I have reviewed the data as listed    Component Value Date/Time   NA 138 10/28/2016 0838   K 4.4 10/28/2016 0838   CL 106 09/05/2016 1026   CO2 26 10/28/2016 0838   GLUCOSE 104 10/28/2016 0838   BUN 11.0 10/28/2016 0838   CREATININE 0.9 10/28/2016 0838   CALCIUM 9.7 10/28/2016 0838   PROT 7.6 10/28/2016  2703   ALBUMIN 3.4 (L) 10/28/2016 0838   AST 15 10/28/2016 0838   ALT 10 10/28/2016 0838   ALKPHOS 117 10/28/2016 0838   BILITOT 0.44 10/28/2016 0838   GFRNONAA >60 09/05/2016 1026   GFRAA >60 09/05/2016 1026    No results found for: SPEP, UPEP  Lab Results  Component Value Date   WBC 10.0 10/28/2016   NEUTROABS 8.3 (H) 10/28/2016   HGB 11.3 (L) 10/28/2016   HCT 34.0 (L) 10/28/2016   MCV 84.7 10/28/2016   PLT 392 10/28/2016      Chemistry      Component Value Date/Time   NA 138 10/28/2016 0838   K 4.4 10/28/2016 0838   CL 106 09/05/2016 1026   CO2 26 10/28/2016 0838   BUN 11.0 10/28/2016 0838   CREATININE 0.9 10/28/2016 0838      Component  Value Date/Time   CALCIUM 9.7 10/28/2016 0838   ALKPHOS 117 10/28/2016 0838   AST 15 10/28/2016 0838   ALT 10 10/28/2016 0838   BILITOT 0.44 10/28/2016 0838       RADIOGRAPHIC STUDIES: I have personally reviewed the radiological images as listed and agreed with the findings in the report. No results found.  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 CT ABDOMEN PELVIS W CONTRAST    Standing Status:   Future    Standing Expiration Date:   01/28/2018    Scheduling Instructions:     Please call son for appt details: [phone]    Order Specific Question:   Reason for Exam (SYMPTOM  OR DIAGNOSIS REQUIRED)    Answer:   staging uterine ca    Order Specific Question:   Preferred imaging location?    Answer:   [facility]   All questions were answered. The patient knows to call the clinic with any problems, questions or concerns. No barriers to learning was detected. I spent 15 minutes counseling the patient face to face. The total time spent in the appointment was 20 minutes and more than 50% was on counseling and review of test results     Heath Lark, MD 10/28/2016 2:48 PM

## 2016-10-28 NOTE — Telephone Encounter (Signed)
Scheduled appt per 7/9 los - Gave patient AVS and calender per los.  

## 2016-10-28 NOTE — Patient Instructions (Signed)
[place] Discharge Instructions for Patients Receiving Chemotherapy  Today you received the following chemotherapy agents: Taxol and Carboplatin.  To help prevent nausea and vomiting after your treatment, we encourage you to take your nausea medication as needed   If you develop nausea and vomiting that is not controlled by your nausea medication, call the clinic.   BELOW ARE SYMPTOMS THAT SHOULD BE REPORTED IMMEDIATELY:  *FEVER GREATER THAN 100.5 F  *CHILLS WITH OR WITHOUT FEVER  NAUSEA AND VOMITING THAT IS NOT CONTROLLED WITH YOUR NAUSEA MEDICATION  *UNUSUAL SHORTNESS OF BREATH  *UNUSUAL BRUISING OR BLEEDING  TENDERNESS IN MOUTH AND THROAT WITH OR WITHOUT PRESENCE OF ULCERS  *URINARY PROBLEMS  *BOWEL PROBLEMS  UNUSUAL RASH Items with * indicate a potential emergency and should be followed up as soon as possible.  Feel free to call the clinic you have any questions or concerns. The clinic phone number is [phone].  Please show the [facility] at check-in to the Emergency Department and triage nurse.

## 2016-10-28 NOTE — Assessment & Plan Note (Signed)
The cause of her anemia is multifactorial, likely anemia chronic illness and recent vaginal bleeding She is not symptomatic We will monitor closely

## 2016-10-29 LAB — CA 125: CANCER ANTIGEN (CA) 125: 210.9 U/mL — AB (ref 0.0–38.1)

## 2016-10-30 ENCOUNTER — Telehealth: Payer: Self-pay | Admitting: *Deleted

## 2016-10-30 NOTE — Telephone Encounter (Signed)
Per staff message I have scheduled appt for the patient to see Dr. Denman George on July 23rd at 10:15am.

## 2016-11-08 ENCOUNTER — Ambulatory Visit (HOSPITAL_COMMUNITY)
Admission: RE | Admit: 2016-11-08 | Discharge: 2016-11-08 | Disposition: A | Discharge status: Home / Self Care | Payer: Medicare Other | Source: Ambulatory Visit | Attending: Hematology and Oncology | Admitting: Hematology and Oncology

## 2016-11-08 DIAGNOSIS — C541 Malignant neoplasm of endometrium: Secondary | ICD-10-CM | POA: Diagnosis present

## 2016-11-08 DIAGNOSIS — K8020 Calculus of gallbladder without cholecystitis without obstruction: Secondary | ICD-10-CM | POA: Diagnosis not present

## 2016-11-08 MED ORDER — IOPAMIDOL (ISOVUE-300) INJECTION 61%
100.0000 mL | Freq: Once | INTRAVENOUS | Status: AC | PRN
Start: 2016-11-08 — End: 2016-11-08
  Administered 2016-11-08: 100 mL via INTRAVENOUS

## 2016-11-08 MED ORDER — IOPAMIDOL (ISOVUE-300) INJECTION 61%
INTRAVENOUS | Status: AC
  Filled 2016-11-08: qty 100

## 2016-11-11 ENCOUNTER — Ambulatory Visit: Payer: Medicare Other | Attending: Gynecologic Oncology | Admitting: Gynecologic Oncology

## 2016-11-11 ENCOUNTER — Encounter: Payer: Self-pay | Admitting: Gynecologic Oncology

## 2016-11-11 VITALS — BP 122/74 | HR 86 | Temp 98.1°F | Resp 18 | Ht 64.0 in | Wt 194.6 lb

## 2016-11-11 DIAGNOSIS — C541 Malignant neoplasm of endometrium: Secondary | ICD-10-CM

## 2016-11-11 NOTE — Patient Instructions (Signed)
Plan to follow up with Dr. Alvy Bimler as scheduled.  We will plan to see you at the completion of chemotherapy.  Please call for any questions or concerns.

## 2016-11-12 ENCOUNTER — Encounter: Payer: Self-pay | Admitting: Gynecologic Oncology

## 2016-11-12 NOTE — Progress Notes (Signed)
Gynecologic Oncology Multi-Disciplinary Disposition Conference Note  Date of the Conference: November 11, 2016  Patient Name: Emily Duarte  Referring Provider: Dr. Gery Pray Primary GYN Oncologist: Dr. Everitt Amber  Stage/Disposition:  Stage IV poorly differentiated endometrial cancer.  Disposition is to 3 additional cycles of carb/tax with subsequent restaging imaging.  At that time, consider surgery vs radiation with goal of optimizing local pelvic control.  Order MSI/ER/PR on specimen obtained at [facility].   This Multidisciplinary conference took place involving physicians from [place], [place], Radiation Oncology, Pathology, Radiology along with the Gynecologic Oncology Nurse Practitioner and RN.  Comprehensive assessment of the patient's malignancy, staging, need for surgery, chemotherapy, radiation therapy, and need for further testing were reviewed. Supportive measures, both inpatient and following discharge were also discussed. The recommended plan of care is documented. Greater than 35 minutes were spent correlating and coordinating this patient's care.

## 2016-11-12 NOTE — Progress Notes (Unsigned)
Consult Note: Gyn-Onc  Consult was requested by Dr. Alvy Bimler and Dr Sondra Come for the evaluation of Emily Duarte 66 y.o. female  CC:  Chief Complaint  Patient presents with  . Endometrial cancer [facility])    Assessment/Plan:  Emily Duarte  is a 66 y.o.  year old with stage IV poorly differentiated endometrial cancer in the setting of paranoid schizophrenia. The patient is having a good partial response to the first 3 cycles of chemotherapy.  However, she still has a burden of disease in her peritoneal cavity and nodal basins, and on exam, the parametrium feels involved by tumor. Therefore, I do not recommend stopping therapy at this point for surgery.  I recommend continuing with 3 additional cycles of carb/tax with subsequent restaging imaging and I'll see her again at that point for an exam. Pending the results of the exam and scan we will decide upon surgery vs radiation (with the goal of optimizing local pelvic control). I discussed with the patient and her son that surgery and radiation have no role in impacting survival in stage IV endometrial cancer. Their role is primarily to control the tumor in the pelvis and control symptoms from that such as bleeding. Therefore it is important to not delay or suspend the systemic therapy at this time which has the potential to impact survival from the cancer.  If on repeat exam after 3 additional cycles she has complete peritoneal response but peristent uterine and parametrial disease, I would recommend radiation. If on repeat exam after 3 additional cycles she has complete peritoneal response and regression of parametrial disease, I would recommend surgery. If on repeat exam after 3 additional cycles she has persistent peritoneal and nodal disease I would recommend continuing with chemotherapy.   HPI: Emily Duarte is a 66 year old woman who is seen in consultation at the request of Dr Alvy Bimler and Sondra Come for stage IV endometrial  cancer.  The patient was [facility] in [place] in *** for vaginal bleeding. During that time examination was concerning for potential endometrial cancer. Due to her acute psychiatric issues (paranoid schizophrenia) she was transferred to [facility] for inpatient psychiatric care. At that location she received an endometrial biopsy on *** which revealed *** and a consultation with Dr Tamera Stands who felt that she had locally advanced disease with vaginal involvement and recommended radiation and chemotherapy with subsequent surgery.   She was eventually discharged from hospital and presented to [facility] to receive care with Dr Sondra Come on ***.     Interval History: ***  Current Meds:  Outpatient Encounter Prescriptions as of 11/11/2016  Medication Sig  . dexamethasone (DECADRON) 4 MG tablet Take 10 tablets at 6 am on the day of chemo only, every 3 weeks  . divalproex (DEPAKOTE ER) 500 MG 24 hr tablet Take by mouth daily.   Marland Kitchen HYDROcodone-acetaminophen (NORCO/VICODIN) 5-325 MG tablet Take 1 tablet by mouth twice a day as needed for pain or three times daily as needed  . ibuprofen (ADVIL,MOTRIN) 200 MG tablet Take 200 mg by mouth every 6 (six) hours as needed.  . lidocaine-prilocaine (EMLA) cream Apply to affected area once  . omega-3 acid ethyl esters (LOVAZA) 1 g capsule Take by mouth.  . ondansetron (ZOFRAN) 8 MG tablet Take 1 tablet (8 mg total) by mouth 2 (two) times daily as needed for refractory nausea / vomiting. Start on day 3 after chemo.  . prochlorperazine (COMPAZINE) 10 MG tablet Take 1 tablet (10  mg total) by mouth every 6 (six) hours as needed (Nausea or vomiting).   No facility-administered encounter medications on file as of 11/11/2016.     Allergy: No Known Allergies  Social Hx:   Social History   Social History  . Marital status: Married    Spouse name: N/A  . Number of children: 1  . Years of education: N/A   Occupational History  . Retired     Social History Main Topics  . Smoking status: Never Smoker  . Smokeless tobacco: Never Used  . Alcohol use No  . Drug use: No  . Sexual activity: No   Other Topics Concern  . Not on file   Social History Narrative  . No narrative on file    Past Surgical Hx:  Past Surgical History:  Procedure Laterality Date  . ENDOMETRIAL BIOPSY  08/11/2016  . IR FLUORO GUIDE PORT INSERTION RIGHT  09/05/2016  . IR US GUIDE VASC ACCESS RIGHT  09/05/2016  . TUBAL LIGATION      Past Medical Hx:  Past Medical History:  Diagnosis Date  . Endometrial cancer ([place])   . Hemorrhoid   . Hypertension   . Paranoid schizophrenia ([place])   . Snake bite     Past Gynecological History:  *** No LMP recorded. Patient is postmenopausal.  Family Hx:  Family History  Problem Relation Age of Onset  . Colon cancer Mother        late 21s  . Schizophrenia Sister   . Colon cancer Sister        late 90 s    Review of Systems:  Constitutional  Feels well,  ***  ENT Normal appearing ears and nares bilaterally Skin/Breast  No rash, sores, jaundice, itching, dryness Cardiovascular  No chest pain, shortness of breath, or edema  Pulmonary  No cough or wheeze.  Gastro Intestinal  No nausea, vomitting, or diarrhoea. No bright red blood per rectum, no abdominal pain, change in bowel movement, or constipation.  Genito Urinary  No frequency, urgency, dysuria, *** Musculo Skeletal  No myalgia, arthralgia, joint swelling or pain  Neurologic  No weakness, numbness, change in gait,  Psychology  No depression, anxiety, insomnia.   Vitals:  Blood pressure 122/74, pulse 86, temperature 98.1 F (36.7 C), temperature source Oral, resp. rate 18, height 5\' 4"  (1.626 m), weight 194 lb 9.6 oz (88.3 kg), SpO2 98 %.  Physical Exam: WD in NAD Neck  Supple NROM, without any enlargements.  Lymph Node Survey No cervical supraclavicular or inguinal adenopathy Cardiovascular  Pulse normal rate, regularity and  rhythm. S1 and S2 normal.  Lungs  Clear to auscultation bilateraly, without wheezes/crackles/rhonchi. Good air movement.  Skin  No rash/lesions/breakdown  Psychiatry  Alert and oriented to person, place, and time  Abdomen  Normoactive bowel sounds, abdomen soft, non-tender and obese without evidence of hernia. *** Back No CVA tenderness Genito Urinary  Vulva/vagina: Normal external female genitalia. ***  No lesions. No discharge or bleeding.  Bladder/urethra:  No lesions or masses, well supported bladder  Vagina: ***  Cervix: Normal appearing, no lesions.  Uterus: *** Small, mobile, no parametrial involvement or nodularity.  Adnexa: *** masses. Rectal  Good tone, no masses no cul de sac nodularity.  Extremities  No bilateral cyanosis, clubbing or edema.   Donaciano Eva, MD  11/12/2016, 7:22 AM

## 2016-11-13 ENCOUNTER — Encounter: Payer: Self-pay | Admitting: Gynecologic Oncology

## 2016-11-13 NOTE — Progress Notes (Signed)
Spoke with path to have MSI, ER/PR ordered on patient's path specimen from [facility].

## 2016-11-14 ENCOUNTER — Ambulatory Visit (HOSPITAL_BASED_OUTPATIENT_CLINIC_OR_DEPARTMENT_OTHER): Payer: Medicare Other | Admitting: Hematology and Oncology

## 2016-11-14 ENCOUNTER — Ambulatory Visit (HOSPITAL_BASED_OUTPATIENT_CLINIC_OR_DEPARTMENT_OTHER): Payer: Medicare Other

## 2016-11-14 ENCOUNTER — Telehealth: Payer: Self-pay | Admitting: *Deleted

## 2016-11-14 ENCOUNTER — Encounter: Payer: Self-pay | Admitting: Hematology and Oncology

## 2016-11-14 ENCOUNTER — Other Ambulatory Visit (HOSPITAL_BASED_OUTPATIENT_CLINIC_OR_DEPARTMENT_OTHER): Payer: Medicare Other

## 2016-11-14 ENCOUNTER — Telehealth: Payer: Self-pay | Admitting: Hematology and Oncology

## 2016-11-14 DIAGNOSIS — C541 Malignant neoplasm of endometrium: Secondary | ICD-10-CM | POA: Diagnosis present

## 2016-11-14 DIAGNOSIS — R748 Abnormal levels of other serum enzymes: Secondary | ICD-10-CM | POA: Diagnosis not present

## 2016-11-14 DIAGNOSIS — D638 Anemia in other chronic diseases classified elsewhere: Secondary | ICD-10-CM

## 2016-11-14 LAB — COMPREHENSIVE METABOLIC PANEL
ALT: 89 U/L — ABNORMAL HIGH (ref 0–55)
ANION GAP: 10 meq/L (ref 3–11)
AST: 103 U/L — ABNORMAL HIGH (ref 5–34)
Albumin: 3.1 g/dL — ABNORMAL LOW (ref 3.5–5.0)
Alkaline Phosphatase: 205 U/L — ABNORMAL HIGH (ref 40–150)
BUN: 6.6 mg/dL — ABNORMAL LOW (ref 7.0–26.0)
CHLORIDE: 104 meq/L (ref 98–109)
CO2: 27 meq/L (ref 22–29)
Calcium: 9 mg/dL (ref 8.4–10.4)
Creatinine: 0.9 mg/dL (ref 0.6–1.1)
EGFR: 71 mL/min/{1.73_m2} — AB (ref >90)
GLUCOSE: 103 mg/dL (ref 70–140)
POTASSIUM: 3.4 meq/L — AB (ref 3.5–5.1)
SODIUM: 141 meq/L (ref 136–145)
TOTAL PROTEIN: 7.3 g/dL (ref 6.4–8.3)
Total Bilirubin: 0.42 mg/dL (ref 0.20–1.20)

## 2016-11-14 LAB — CBC WITH DIFFERENTIAL/PLATELET
BASO%: 0.4 % (ref 0.0–2.0)
Basophils Absolute: 0 10*3/uL (ref 0.0–0.1)
EOS ABS: 0.1 10*3/uL (ref 0.0–0.5)
EOS%: 0.8 % (ref 0.0–7.0)
HCT: 34.4 % — ABNORMAL LOW (ref 34.8–46.6)
HGB: 10.9 g/dL — ABNORMAL LOW (ref 11.6–15.9)
LYMPH%: 33.1 % (ref 14.0–49.7)
MCH: 28.2 pg (ref 25.1–34.0)
MCHC: 31.7 g/dL (ref 31.5–36.0)
MCV: 89.1 fL (ref 79.5–101.0)
MONO#: 0.5 10*3/uL (ref 0.1–0.9)
MONO%: 7.1 % (ref 0.0–14.0)
NEUT%: 58.6 % (ref 38.4–76.8)
NEUTROS ABS: 4.4 10*3/uL (ref 1.5–6.5)
PLATELETS: 189 10*3/uL (ref 145–400)
RBC: 3.86 10*6/uL (ref 3.70–5.45)
RDW: 17 % — ABNORMAL HIGH (ref 11.2–14.5)
WBC: 7.5 10*3/uL (ref 3.9–10.3)
lymph#: 2.5 10*3/uL (ref 0.9–3.3)

## 2016-11-14 MED ORDER — HEPARIN SOD (PORK) LOCK FLUSH 100 UNIT/ML IV SOLN
500.0000 [IU] | Freq: Once | INTRAVENOUS | Status: AC
  Administered 2016-11-14: 500 [IU]
  Filled 2016-11-14: qty 5

## 2016-11-14 MED ORDER — SODIUM CHLORIDE 0.9% FLUSH
10.0000 mL | Freq: Once | INTRAVENOUS | Status: AC
  Administered 2016-11-14: 10 mL
  Filled 2016-11-14: qty 10

## 2016-11-14 NOTE — Telephone Encounter (Signed)
-----   Message from Heath Lark, MD sent at 11/14/2016  1:54 PM EDT ----- Regarding: LFT a bit high PLs call the son. LFT a bit high likely because we draw it sooner than her chemo on Monday I recommend recheck labs again Monday morning before we proceed Please add labs only appt early on Monday before chemo and he needs to bring her in sooner. You need to place scheduling msg ----- Message ----- From: Interface, Lab In Three Zero One Sent: 11/14/2016   1:16 PM To: Heath Lark, MD

## 2016-11-14 NOTE — Telephone Encounter (Signed)
Gave patient avs report and appointments for July thru September.  °

## 2016-11-14 NOTE — Assessment & Plan Note (Addendum)
She tolerated chemotherapy well he well without major side effects She have improvement of blood count and tumor marker CT imaging study show very good response to treatment After significant discussion at tumor board, our plan would be to proceed with 3 more cycles of chemotherapy before repeat CT imaging and to consider after that whether radiation treatment or surgical resection would be appropriate.

## 2016-11-14 NOTE — Telephone Encounter (Signed)
Son notified of message below. Will bring at 0800 on Monday for lab and flush before chemo.  Msg to scheduler

## 2016-11-14 NOTE — Assessment & Plan Note (Signed)
This is likely due to premature blood draw so soon after recent chemo She is not symptomatic Recommend reasons check blood work next week As long as AST and ALT are still within acceptable range with normal bilirubin, we will proceed with treatment without dose adjustment

## 2016-11-15 NOTE — Progress Notes (Signed)
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 However, she was transferred to [facility] on 08/06/16 for suicidal ideation. There, GYN was consulted and ordered biopsy. Pt was transferred to medical unit for blood transfusion.       08/10/2016 Imaging    US pelvis:  The uterus is prominent and heterogeneous measuring 10.9 x 6.3 x 6.1 cm. There is a probable anterior exophytic fundal fibroid measuring 4.5 cm. The endometrial stripe is normal in thickness at 6.4 mm. I see no fluid in the canal  Right ovary is not visible. Left ovary 2.3 x 1.7 x 1.9 cm with normal echogenicity. Color flow and spectral Doppler: Normal arterial inflow and venous outflow.  There is no significant free fluid.   I see no adnexal masses.      08/11/2016 - 08/15/2016 Hospital Admission    She was admitted voluntarily to Psychiatry unit 08/06/2016 for suicidal ideation.She was evaluated in an outside facility due to vaginal bleeding and endometrial and endocervical biopsies performed. Pathology revealed a poorly differentiated cancer with positive estrogen receptors felt to be suggestive of endometrial cancer. The patient received 2 units of prbcs total during her stay for anemia.        08/11/2016 Pathology Results       1. Endocervix, curettings and polyp. (*please see comment below). --High-grade carcinoma, undifferentiated.  2. Endometrial biopsy:(*please see comment below). --High-grade carcinoma, undifferentiated. --Weakly positive for estrogen receptors suggestive of endometrial origin; Negative for progesterone receptors and neutral mucin..       08/14/2016 Imaging    CT imaging 1. Uterine  mass and abnormality consistent with clinical history of endometrial carcinoma as described above with bilateral pelvic and retroperitoneal adenopathy consistent with nodal metastasis. 2. No evidence of nonnodal distant metastasis. 3. Nonspecific incidental finding of hypertrophy of the pylorus muscle. 4. incidental finding of cholelithiasis      09/04/2016 Imaging    CT chest: 1. 4 mm nodule in the anterior aspect of the right upper lobe. This is highly nonspecific. Although metastatic disease is not excluded, it is not strongly favored. Attention under routine followup examinations is recommended to ensure the stability or resolution of this finding. 2. Trace right pleural effusion lying dependently is simple in appearance. 3. Mild cardiomegaly.      09/05/2016 Procedure    Successful placement of a right internal jugular approach power injectable Port-A-Cath. The catheter is ready for immediate use.      09/05/2016 Tumor Marker    Patient's tumor was tested for the following markers: CA125 Results of the tumor marker test revealed 1150      09/09/2016 -  Chemotherapy    She received chemotherapy with carboplatin and Taxol      09/30/2016 Tumor Marker    Patient's tumor was tested for the following markers: CA125 Results of the tumor marker test revealed 338.1      10/28/2016 Tumor Marker    Patient's tumor was tested for the following markers: CA125 Results of the tumor marker test revealed 210.9      11/08/2016 Imaging    CT scan abdomen and pelvis 1. Soft tissue nodularity throughout the omental and perihepatic fat worrisome for peritoneal carcinomatosis. No ascites. 2.  No other definitive evidence of metastatic disease. There are prominent left pelvic sidewall and right inguinal lymph nodes. 3. Nonspecific findings in the uterus, likely related to treated tumor and fibroids. Prior pelvic imaging unavailable. 4. Cholelithiasis.       INTERVAL HISTORY: Please see below for  problem oriented charting. She returns with her son for further follow-up She tolerated chemo very well Denies mucositis, significant nausea or vomiting Her bowel habits has been stable She denies recent vaginal bleeding She denies significant peripheral neuropathy  REVIEW OF SYSTEMS:   Constitutional: Denies fevers, chills or abnormal weight loss Eyes: Denies blurriness of vision Ears, nose, mouth, throat, and face: Denies mucositis or sore throat Respiratory: Denies cough, dyspnea or wheezes Cardiovascular: Denies palpitation, chest discomfort or lower extremity swelling Gastrointestinal:  Denies nausea, heartburn or change in bowel habits Skin: Denies abnormal skin rashes Lymphatics: Denies new lymphadenopathy or easy bruising Neurological:Denies numbness, tingling or new weaknesses Behavioral/Psych: Mood is stable, no new changes  All other systems were reviewed with the patient and are negative.  I have reviewed the past medical history, past surgical history, social history and family history with the patient and they are unchanged from previous note.  ALLERGIES:  has No Known Allergies.  MEDICATIONS:  Current Outpatient Prescriptions  Medication Sig Dispense Refill  . dexamethasone (DECADRON) 4 MG tablet Take 10 tablets at 6 am on the day of chemo only, every 3 weeks 30 tablet 1  . divalproex (DEPAKOTE ER) 500 MG 24 hr tablet Take by mouth daily.     Marland Kitchen HYDROcodone-acetaminophen (NORCO/VICODIN) 5-325 MG tablet Take 1 tablet by mouth twice a day as needed for pain or three times daily as needed    . ibuprofen (ADVIL,MOTRIN) 200 MG tablet Take 200 mg by mouth every 6 (six) hours as needed.    . lidocaine-prilocaine (EMLA) cream Apply to affected area once 30 g 3  . omega-3 acid ethyl esters (LOVAZA) 1 g capsule Take by mouth.    . ondansetron (ZOFRAN) 8 MG tablet Take 1 tablet (8 mg total) by mouth 2 (two) times daily as needed for refractory nausea / vomiting. Start on day 3  after chemo. 30 tablet 1  . prochlorperazine (COMPAZINE) 10 MG tablet Take 1 tablet (10 mg total) by mouth every 6 (six) hours as needed (Nausea or vomiting). 30 tablet 1   No current facility-administered medications for this visit.     PHYSICAL EXAMINATION: ECOG PERFORMANCE STATUS: 1 - Symptomatic but completely ambulatory  Vitals:   11/14/16 1317  BP: 136/63  Pulse: 95  Resp: 17  Temp: 98.7 F (37.1 C)   Filed Weights   11/14/16 1317  Weight: 196 lb 11.2 oz (89.2 kg)    GENERAL:alert, no distress and comfortable SKIN: skin color, texture, turgor are normal, no rashes or significant lesions EYES: normal, Conjunctiva are pink and non-injected, sclera clear OROPHARYNX:no exudate, no erythema and lips, buccal mucosa, and tongue normal  NECK: supple, thyroid normal size, non-tender, without nodularity LYMPH:  no palpable lymphadenopathy in the cervical, axillary or inguinal LUNGS: clear to auscultation and percussion with normal breathing effort HEART: regular rate & rhythm and no murmurs and no lower extremity edema ABDOMEN:abdomen soft, non-tender and normal bowel sounds Musculoskeletal:no cyanosis of digits and no clubbing  NEURO: alert & oriented x 3 with fluent speech, no focal motor/sensory deficits  LABORATORY DATA:  I have reviewed the data as listed    Component Value Date/Time   NA 141 11/14/2016 1301  K 3.4 (L) 11/14/2016 1301   CL 106 09/05/2016 1026   CO2 27 11/14/2016 1301   GLUCOSE 103 11/14/2016 1301   BUN 6.6 (L) 11/14/2016 1301   CREATININE 0.9 11/14/2016 1301   CALCIUM 9.0 11/14/2016 1301   PROT 7.3 11/14/2016 1301   ALBUMIN 3.1 (L) 11/14/2016 1301   AST 103 (H) 11/14/2016 1301   ALT 89 (H) 11/14/2016 1301   ALKPHOS 205 (H) 11/14/2016 1301   BILITOT 0.42 11/14/2016 1301   GFRNONAA >60 09/05/2016 1026   GFRAA >60 09/05/2016 1026    No results found for: SPEP, UPEP  Lab Results  Component Value Date   WBC 7.5 11/14/2016   NEUTROABS 4.4  11/14/2016   HGB 10.9 (L) 11/14/2016   HCT 34.4 (L) 11/14/2016   MCV 89.1 11/14/2016   PLT 189 11/14/2016      Chemistry      Component Value Date/Time   NA 141 11/14/2016 1301   K 3.4 (L) 11/14/2016 1301   CL 106 09/05/2016 1026   CO2 27 11/14/2016 1301   BUN 6.6 (L) 11/14/2016 1301   CREATININE 0.9 11/14/2016 1301      Component Value Date/Time   CALCIUM 9.0 11/14/2016 1301   ALKPHOS 205 (H) 11/14/2016 1301   AST 103 (H) 11/14/2016 1301   ALT 89 (H) 11/14/2016 1301   BILITOT 0.42 11/14/2016 1301       RADIOGRAPHIC STUDIES: I reviewed imaging study with the patient and her son I have personally reviewed the radiological images as listed and agreed with the findings in the report. Ct Abdomen Pelvis W Contrast  Result Date: 11/08/2016 CLINICAL DATA:  Endometrial/ uterine adenocarcinoma diagnosed in 2018. Chemotherapy in progress. Radiation therapy completed. EXAM: CT ABDOMEN AND PELVIS WITH CONTRAST TECHNIQUE: Multidetector CT imaging of the abdomen and pelvis was performed using the standard protocol following bolus administration of intravenous contrast. CONTRAST:  114mL ISOVUE-300 IOPAMIDOL (ISOVUE-300) INJECTION 61% COMPARISON:  Chest CT 09/04/2016. No previous abdominopelvic imaging available. FINDINGS: There is mild breathing artifact on the images through the lower chest and upper abdomen. Lower chest: 2 mm subpleural right lower lobe nodule on image number 2 is unchanged from the previous study. The lung bases are otherwise clear. There is no significant pleural or pericardial effusion. Hepatobiliary: The liver is normal in density without focal abnormality. There are several peripherally calcified gallstones. No evidence of gallbladder wall thickening, surrounding inflammation or biliary dilatation. Pancreas: Unremarkable. No pancreatic ductal dilatation or surrounding inflammatory changes. Spleen: Normal in size without focal abnormality. Adrenals/Urinary Tract: Both adrenal  glands appear normal. Both kidneys appear normal. There is no evidence of renal mass, urinary tract calculus or hydronephrosis. Mild bladder wall thickening is present, probably related to previous pelvic irradiation. Stomach/Bowel: No evidence of bowel wall thickening, distention or surrounding inflammatory change. Vascular/Lymphatic: There are no enlarged abdominal lymph nodes. There is a 10 mm left pelvic sidewall node on image 71. There is a mildly prominent right inguinal node, measuring 9 mm short axis on image 75. No significant vascular findings. Reproductive: There is an anterior uterine mass measuring 4.6 cm on image 67, likely a fibroid. There is mild additional myometrial heterogeneity. No significant endometrial thickening is seen. There is ill-defined low-density in the cervix, nonspecific. No adnexal mass. Other: There is soft tissue nodularity throughout the omental fat suspicious for peritoneal carcinomatosis. This demonstrates some extension into the fat anterior to the liver. No ascites. Musculoskeletal: No acute or significant osseous findings. 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 IMPRESSION: 1. Soft tissue nodularity throughout the omental and perihepatic fat worrisome for peritoneal carcinomatosis. No ascites. 2. No other definitive evidence of metastatic disease. There are prominent left pelvic sidewall and right inguinal lymph nodes. 3. Nonspecific findings in the uterus, likely related to treated tumor and fibroids. Prior pelvic imaging unavailable. 4. Cholelithiasis. Electronically Signed   By: Richardean Sale M.D.   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   Elevated liver enzymes This is likely due to premature blood draw so soon after recent chemo She is not symptomatic Recommend reasons check blood work next week As long as AST and ALT are still within acceptable range with normal bilirubin, we will proceed with treatment without dose adjustment  Anemia, chronic disease The cause of her anemia is multifactorial, likely anemia chronic illness and from recent chemotherapy She is not symptomatic We will monitor closely   No orders of the defined types were placed in this encounter.  All questions were answered. The patient knows to call the clinic with any problems, questions or concerns. No barriers to learning was detected. I spent 20 minutes counseling the patient face to face. The total time spent in the appointment was 25 minutes and more than 50% was on counseling and review of test results     Heath Lark, MD 11/15/2016 8:41 AM

## 2016-11-15 NOTE — Assessment & Plan Note (Signed)
The cause of her anemia is multifactorial, likely anemia chronic illness and from recent chemotherapy She is not symptomatic We will monitor closely 

## 2016-11-18 ENCOUNTER — Other Ambulatory Visit (HOSPITAL_BASED_OUTPATIENT_CLINIC_OR_DEPARTMENT_OTHER): Payer: Medicare Other

## 2016-11-18 ENCOUNTER — Telehealth: Payer: Self-pay

## 2016-11-18 ENCOUNTER — Ambulatory Visit (HOSPITAL_BASED_OUTPATIENT_CLINIC_OR_DEPARTMENT_OTHER): Payer: Medicare Other

## 2016-11-18 VITALS — BP 129/71 | HR 74 | Temp 98.5°F | Resp 17

## 2016-11-18 DIAGNOSIS — Z5111 Encounter for antineoplastic chemotherapy: Secondary | ICD-10-CM | POA: Diagnosis present

## 2016-11-18 DIAGNOSIS — C541 Malignant neoplasm of endometrium: Secondary | ICD-10-CM | POA: Diagnosis not present

## 2016-11-18 LAB — COMPREHENSIVE METABOLIC PANEL
ALT: 39 U/L (ref 0–55)
AST: 26 U/L (ref 5–34)
Albumin: 3.3 g/dL — ABNORMAL LOW (ref 3.5–5.0)
Alkaline Phosphatase: 191 U/L — ABNORMAL HIGH (ref 40–150)
Anion Gap: 9 mEq/L (ref 3–11)
BUN: 6.8 mg/dL — AB (ref 7.0–26.0)
CHLORIDE: 105 meq/L (ref 98–109)
CO2: 26 meq/L (ref 22–29)
CREATININE: 0.9 mg/dL (ref 0.6–1.1)
Calcium: 9.6 mg/dL (ref 8.4–10.4)
EGFR: 69 mL/min/{1.73_m2} — ABNORMAL LOW (ref >90)
Glucose: 111 mg/dl (ref 70–140)
POTASSIUM: 3.6 meq/L (ref 3.5–5.1)
SODIUM: 140 meq/L (ref 136–145)
Total Bilirubin: 0.49 mg/dL (ref 0.20–1.20)
Total Protein: 7.6 g/dL (ref 6.4–8.3)

## 2016-11-18 LAB — CBC WITH DIFFERENTIAL/PLATELET
BASO%: 0.2 % (ref 0.0–2.0)
BASOS ABS: 0 10*3/uL (ref 0.0–0.1)
EOS%: 0.4 % (ref 0.0–7.0)
Eosinophils Absolute: 0 10*3/uL (ref 0.0–0.5)
HCT: 34.7 % — ABNORMAL LOW (ref 34.8–46.6)
HGB: 11 g/dL — ABNORMAL LOW (ref 11.6–15.9)
LYMPH#: 0.9 10*3/uL (ref 0.9–3.3)
LYMPH%: 9.3 % — AB (ref 14.0–49.7)
MCH: 28.3 pg (ref 25.1–34.0)
MCHC: 31.7 g/dL (ref 31.5–36.0)
MCV: 89.2 fL (ref 79.5–101.0)
MONO#: 0.2 10*3/uL (ref 0.1–0.9)
MONO%: 1.9 % (ref 0.0–14.0)
NEUT#: 8.7 10*3/uL — ABNORMAL HIGH (ref 1.5–6.5)
NEUT%: 88.2 % — AB (ref 38.4–76.8)
Platelets: 243 10*3/uL (ref 145–400)
RBC: 3.89 10*6/uL (ref 3.70–5.45)
RDW: 17.1 % — AB (ref 11.2–14.5)
WBC: 9.9 10*3/uL (ref 3.9–10.3)

## 2016-11-18 MED ORDER — FAMOTIDINE IN NACL 20-0.9 MG/50ML-% IV SOLN
20.0000 mg | Freq: Once | INTRAVENOUS | Status: AC
  Administered 2016-11-18: 20 mg via INTRAVENOUS

## 2016-11-18 MED ORDER — PALONOSETRON HCL INJECTION 0.25 MG/5ML
0.2500 mg | Freq: Once | INTRAVENOUS | Status: AC
  Administered 2016-11-18: 0.25 mg via INTRAVENOUS

## 2016-11-18 MED ORDER — DIPHENHYDRAMINE HCL 50 MG/ML IJ SOLN
50.0000 mg | Freq: Once | INTRAMUSCULAR | Status: AC
  Administered 2016-11-18: 50 mg via INTRAVENOUS

## 2016-11-18 MED ORDER — PACLITAXEL CHEMO INJECTION 300 MG/50ML
175.0000 mg/m2 | Freq: Once | INTRAVENOUS | Status: AC
  Administered 2016-11-18: 360 mg via INTRAVENOUS
  Filled 2016-11-18: qty 60

## 2016-11-18 MED ORDER — HEPARIN SOD (PORK) LOCK FLUSH 100 UNIT/ML IV SOLN
500.0000 [IU] | Freq: Once | INTRAVENOUS | Status: AC | PRN
  Administered 2016-11-18: 500 [IU]
  Filled 2016-11-18: qty 5

## 2016-11-18 MED ORDER — SODIUM CHLORIDE 0.9% FLUSH
10.0000 mL | INTRAVENOUS | Status: DC | PRN
  Administered 2016-11-18: 10 mL
  Filled 2016-11-18: qty 10

## 2016-11-18 MED ORDER — DIPHENHYDRAMINE HCL 50 MG/ML IJ SOLN
INTRAMUSCULAR | Status: AC
  Filled 2016-11-18: qty 1

## 2016-11-18 MED ORDER — FAMOTIDINE IN NACL 20-0.9 MG/50ML-% IV SOLN
INTRAVENOUS | Status: AC
  Filled 2016-11-18: qty 50

## 2016-11-18 MED ORDER — SODIUM CHLORIDE 0.9 % IV SOLN
641.4000 mg | Freq: Once | INTRAVENOUS | Status: AC
  Administered 2016-11-18: 640 mg via INTRAVENOUS
  Filled 2016-11-18: qty 64

## 2016-11-18 MED ORDER — SODIUM CHLORIDE 0.9 % IV SOLN
Freq: Once | INTRAVENOUS | Status: AC
  Administered 2016-11-18: 10:00:00 via INTRAVENOUS

## 2016-11-18 MED ORDER — PALONOSETRON HCL INJECTION 0.25 MG/5ML
INTRAVENOUS | Status: AC
  Filled 2016-11-18: qty 5

## 2016-11-18 MED ORDER — SODIUM CHLORIDE 0.9 % IV SOLN
20.0000 mg | Freq: Once | INTRAVENOUS | Status: AC
  Administered 2016-11-18: 20 mg via INTRAVENOUS
  Filled 2016-11-18: qty 2

## 2016-11-18 NOTE — Patient Instructions (Signed)
[facility] Discharge Instructions for Patients Receiving Chemotherapy  Today you received the following chemotherapy agents:  Carboplatin, Taxol  To help prevent nausea and vomiting after your treatment, we encourage you to take your nausea medication as prescribed.   If you develop nausea and vomiting that is not controlled by your nausea medication, call the clinic.   BELOW ARE SYMPTOMS THAT SHOULD BE REPORTED IMMEDIATELY:  *FEVER GREATER THAN 100.5 F  *CHILLS WITH OR WITHOUT FEVER  NAUSEA AND VOMITING THAT IS NOT CONTROLLED WITH YOUR NAUSEA MEDICATION  *UNUSUAL SHORTNESS OF BREATH  *UNUSUAL BRUISING OR BLEEDING  TENDERNESS IN MOUTH AND THROAT WITH OR WITHOUT PRESENCE OF ULCERS  *URINARY PROBLEMS  *BOWEL PROBLEMS  UNUSUAL RASH Items with * indicate a potential emergency and should be followed up as soon as possible.  Feel free to call the clinic you have any questions or concerns. The clinic phone number is [phone].  Please show the CHEMO ALERT CARD at check-in to the Emergency Department and triage nurse.   

## 2016-11-18 NOTE — Telephone Encounter (Signed)
Attempted call son, voicemail box not set up. Talked with patient in infusion, she states that she is okay with Rx until Dr. Alvy Bimler is back in the office on Friday.

## 2016-11-18 NOTE — Telephone Encounter (Signed)
Son called and left message.  Wants to get a Rx for Norco, usually gets it from Dr. Alyson Ingles, no longer seeing. Wanting to get this Rx from Dr. Alvy Bimler.

## 2016-11-22 ENCOUNTER — Other Ambulatory Visit: Payer: Self-pay | Admitting: Hematology and Oncology

## 2016-11-22 MED ORDER — HYDROCODONE-ACETAMINOPHEN 5-325 MG PO TABS: 1.0000 | ORAL_TABLET | Freq: Four times a day (QID) | ORAL | 0 refills | Status: DC | PRN

## 2016-11-22 NOTE — Telephone Encounter (Signed)
Called with below message. 

## 2016-11-22 NOTE — Telephone Encounter (Signed)
Ready for pick up

## 2016-12-09 ENCOUNTER — Other Ambulatory Visit (HOSPITAL_BASED_OUTPATIENT_CLINIC_OR_DEPARTMENT_OTHER): Payer: Medicare Other

## 2016-12-09 ENCOUNTER — Ambulatory Visit (HOSPITAL_BASED_OUTPATIENT_CLINIC_OR_DEPARTMENT_OTHER): Payer: Medicare Other | Admitting: Hematology and Oncology

## 2016-12-09 ENCOUNTER — Ambulatory Visit: Payer: Medicare Other

## 2016-12-09 ENCOUNTER — Telehealth: Payer: Self-pay | Admitting: Hematology and Oncology

## 2016-12-09 ENCOUNTER — Other Ambulatory Visit: Payer: Self-pay | Admitting: Hematology and Oncology

## 2016-12-09 ENCOUNTER — Encounter: Payer: Self-pay | Admitting: Hematology and Oncology

## 2016-12-09 ENCOUNTER — Ambulatory Visit (HOSPITAL_BASED_OUTPATIENT_CLINIC_OR_DEPARTMENT_OTHER): Payer: Medicare Other

## 2016-12-09 DIAGNOSIS — Z5111 Encounter for antineoplastic chemotherapy: Secondary | ICD-10-CM | POA: Diagnosis not present

## 2016-12-09 DIAGNOSIS — C541 Malignant neoplasm of endometrium: Secondary | ICD-10-CM

## 2016-12-09 DIAGNOSIS — T451X5A Adverse effect of antineoplastic and immunosuppressive drugs, initial encounter: Secondary | ICD-10-CM

## 2016-12-09 DIAGNOSIS — G620 Drug-induced polyneuropathy: Secondary | ICD-10-CM | POA: Insufficient documentation

## 2016-12-09 DIAGNOSIS — D638 Anemia in other chronic diseases classified elsewhere: Secondary | ICD-10-CM

## 2016-12-09 LAB — CBC WITH DIFFERENTIAL/PLATELET
BASO%: 0.1 % (ref 0.0–2.0)
BASOS ABS: 0 10*3/uL (ref 0.0–0.1)
EOS ABS: 0 10*3/uL (ref 0.0–0.5)
EOS%: 0.2 % (ref 0.0–7.0)
HEMATOCRIT: 33.2 % — AB (ref 34.8–46.6)
HGB: 10.6 g/dL — ABNORMAL LOW (ref 11.6–15.9)
LYMPH#: 0.8 10*3/uL — AB (ref 0.9–3.3)
LYMPH%: 8.1 % — ABNORMAL LOW (ref 14.0–49.7)
MCH: 28.3 pg (ref 25.1–34.0)
MCHC: 31.9 g/dL (ref 31.5–36.0)
MCV: 88.8 fL (ref 79.5–101.0)
MONO#: 0.1 10*3/uL (ref 0.1–0.9)
MONO%: 1.3 % (ref 0.0–14.0)
NEUT#: 8.5 10*3/uL — ABNORMAL HIGH (ref 1.5–6.5)
NEUT%: 90.3 % — AB (ref 38.4–76.8)
PLATELETS: 263 10*3/uL (ref 145–400)
RBC: 3.74 10*6/uL (ref 3.70–5.45)
RDW: 17 % — ABNORMAL HIGH (ref 11.2–14.5)
WBC: 9.4 10*3/uL (ref 3.9–10.3)

## 2016-12-09 LAB — COMPREHENSIVE METABOLIC PANEL
ALT: 13 U/L (ref 0–55)
ANION GAP: 9 meq/L (ref 3–11)
AST: 21 U/L (ref 5–34)
Albumin: 3.1 g/dL — ABNORMAL LOW (ref 3.5–5.0)
Alkaline Phosphatase: 122 U/L (ref 40–150)
BILIRUBIN TOTAL: 0.47 mg/dL (ref 0.20–1.20)
BUN: 7 mg/dL (ref 7.0–26.0)
CALCIUM: 9.4 mg/dL (ref 8.4–10.4)
CHLORIDE: 103 meq/L (ref 98–109)
CO2: 27 mEq/L (ref 22–29)
CREATININE: 0.8 mg/dL (ref 0.6–1.1)
EGFR: 73 mL/min/{1.73_m2} — ABNORMAL LOW (ref >90)
Glucose: 113 mg/dl (ref 70–140)
Potassium: 3.4 mEq/L — ABNORMAL LOW (ref 3.5–5.1)
Sodium: 139 mEq/L (ref 136–145)
Total Protein: 7.5 g/dL (ref 6.4–8.3)

## 2016-12-09 MED ORDER — HEPARIN SOD (PORK) LOCK FLUSH 100 UNIT/ML IV SOLN
500.0000 [IU] | Freq: Once | INTRAVENOUS | Status: AC | PRN
  Administered 2016-12-09: 500 [IU]
  Filled 2016-12-09: qty 5

## 2016-12-09 MED ORDER — PACLITAXEL CHEMO INJECTION 300 MG/50ML
131.2500 mg/m2 | Freq: Once | INTRAVENOUS | Status: AC
  Administered 2016-12-09: 270 mg via INTRAVENOUS
  Filled 2016-12-09: qty 45

## 2016-12-09 MED ORDER — SODIUM CHLORIDE 0.9 % IV SOLN
20.0000 mg | Freq: Once | INTRAVENOUS | Status: AC
  Administered 2016-12-09: 20 mg via INTRAVENOUS
  Filled 2016-12-09: qty 2

## 2016-12-09 MED ORDER — SODIUM CHLORIDE 0.9% FLUSH
10.0000 mL | INTRAVENOUS | Status: DC | PRN
  Administered 2016-12-09: 10 mL
  Filled 2016-12-09: qty 10

## 2016-12-09 MED ORDER — FAMOTIDINE IN NACL 20-0.9 MG/50ML-% IV SOLN
20.0000 mg | Freq: Once | INTRAVENOUS | Status: AC
  Administered 2016-12-09: 20 mg via INTRAVENOUS

## 2016-12-09 MED ORDER — SODIUM CHLORIDE 0.9% FLUSH
10.0000 mL | Freq: Once | INTRAVENOUS | Status: AC
  Administered 2016-12-09: 10 mL
  Filled 2016-12-09: qty 10

## 2016-12-09 MED ORDER — SODIUM CHLORIDE 0.9 % IV SOLN
Freq: Once | INTRAVENOUS | Status: AC
  Administered 2016-12-09: 11:00:00 via INTRAVENOUS

## 2016-12-09 MED ORDER — PALONOSETRON HCL INJECTION 0.25 MG/5ML
0.2500 mg | Freq: Once | INTRAVENOUS | Status: AC
  Administered 2016-12-09: 0.25 mg via INTRAVENOUS

## 2016-12-09 MED ORDER — SODIUM CHLORIDE 0.9 % IV SOLN
640.0000 mg | Freq: Once | INTRAVENOUS | Status: AC
  Administered 2016-12-09: 640 mg via INTRAVENOUS
  Filled 2016-12-09: qty 64

## 2016-12-09 MED ORDER — FAMOTIDINE IN NACL 20-0.9 MG/50ML-% IV SOLN
INTRAVENOUS | Status: AC
  Filled 2016-12-09: qty 50

## 2016-12-09 MED ORDER — PALONOSETRON HCL INJECTION 0.25 MG/5ML
INTRAVENOUS | Status: AC
  Filled 2016-12-09: qty 5

## 2016-12-09 MED ORDER — DIPHENHYDRAMINE HCL 50 MG/ML IJ SOLN
INTRAMUSCULAR | Status: AC
  Filled 2016-12-09: qty 1

## 2016-12-09 MED ORDER — DIPHENHYDRAMINE HCL 50 MG/ML IJ SOLN
50.0000 mg | Freq: Once | INTRAMUSCULAR | Status: AC
  Administered 2016-12-09: 50 mg via INTRAVENOUS

## 2016-12-09 NOTE — Patient Instructions (Signed)
[facility] Discharge Instructions for Patients Receiving Chemotherapy  Today you received the following chemotherapy agents Taxol/Carboplatin  To help prevent nausea and vomiting after your treatment, we encourage you to take your nausea medication    If you develop nausea and vomiting that is not controlled by your nausea medication, call the clinic.   BELOW ARE SYMPTOMS THAT SHOULD BE REPORTED IMMEDIATELY:  *FEVER GREATER THAN 100.5 F  *CHILLS WITH OR WITHOUT FEVER  NAUSEA AND VOMITING THAT IS NOT CONTROLLED WITH YOUR NAUSEA MEDICATION  *UNUSUAL SHORTNESS OF BREATH  *UNUSUAL BRUISING OR BLEEDING  TENDERNESS IN MOUTH AND THROAT WITH OR WITHOUT PRESENCE OF ULCERS  *URINARY PROBLEMS  *BOWEL PROBLEMS  UNUSUAL RASH Items with * indicate a potential emergency and should be followed up as soon as possible.  Feel free to call the clinic you have any questions or concerns. The clinic phone number is [phone].  Please show the CHEMO ALERT CARD at check-in to the Emergency Department and triage nurse.   

## 2016-12-09 NOTE — Assessment & Plan Note (Addendum)
She tolerated chemotherapy well he well without major side effects She had mild vaginal bleeding 2 weeks ago, resolved.  She had nothing since then.  She denies pelvic pain. CT imaging study from July 2018 showed very good response to treatment After significant discussion at tumor board, our plan would be to proceed with 3 more cycles of chemotherapy before repeat CT imaging and to consider then whether radiation treatment or surgical resection would be appropriate. I will see her back prior to cycle 6 of treatment. I plan to repeat staging CT scan of the chest, abdomen and pelvis before the end of September We will get her case presented again at the GYN oncology tumor board on September 24th to determine the plan for surgery or radiation.

## 2016-12-09 NOTE — Assessment & Plan Note (Signed)
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  We will monitor closely. 

## 2016-12-09 NOTE — Telephone Encounter (Signed)
Per 8/20 los. - Return for No new orders. 

## 2016-12-09 NOTE — Assessment & Plan Note (Signed)
The cause of her anemia is multifactorial, likely anemia chronic illness and from recent chemotherapy She is not symptomatic We will monitor closely 

## 2016-12-09 NOTE — Progress Notes (Signed)
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 However, she was transferred to [facility] on 08/06/16 for suicidal ideation. There, GYN was consulted and ordered biopsy. Pt was transferred to medical unit for blood transfusion.       08/10/2016 Imaging    US pelvis:  The uterus is prominent and heterogeneous measuring 10.9 x 6.3 x 6.1 cm. There is a probable anterior exophytic fundal fibroid measuring 4.5 cm. The endometrial stripe is normal in thickness at 6.4 mm. I see no fluid in the canal  Right ovary is not visible. Left ovary 2.3 x 1.7 x 1.9 cm with normal echogenicity. Color flow and spectral Doppler: Normal arterial inflow and venous outflow.  There is no significant free fluid.   I see no adnexal masses.      08/11/2016 - 08/15/2016 Hospital Admission    She was admitted voluntarily to Psychiatry unit 08/06/2016 for suicidal ideation.She was evaluated in an outside facility due to vaginal bleeding and endometrial and endocervical biopsies performed. Pathology revealed a poorly differentiated cancer with positive estrogen receptors felt to be suggestive of endometrial cancer. The patient received 2 units of prbcs total during her stay for anemia.        08/11/2016 Pathology Results       1. Endocervix, curettings and polyp. (*please see comment below). --High-grade carcinoma, undifferentiated.  2. Endometrial biopsy:(*please see comment below). --High-grade carcinoma, undifferentiated. --Weakly positive for estrogen receptors suggestive of endometrial origin; Negative for progesterone receptors and neutral mucin..       08/14/2016 Imaging    CT imaging 1. Uterine  mass and abnormality consistent with clinical history of endometrial carcinoma as described above with bilateral pelvic and retroperitoneal adenopathy consistent with nodal metastasis. 2. No evidence of nonnodal distant metastasis. 3. Nonspecific incidental finding of hypertrophy of the pylorus muscle. 4. incidental finding of cholelithiasis      09/04/2016 Imaging    CT chest: 1. 4 mm nodule in the anterior aspect of the right upper lobe. This is highly nonspecific. Although metastatic disease is not excluded, it is not strongly favored. Attention under routine followup examinations is recommended to ensure the stability or resolution of this finding. 2. Trace right pleural effusion lying dependently is simple in appearance. 3. Mild cardiomegaly.      09/05/2016 Procedure    Successful placement of a right internal jugular approach power injectable Port-A-Cath. The catheter is ready for immediate use.      09/05/2016 Tumor Marker    Patient's tumor was tested for the following markers: CA125 Results of the tumor marker test revealed 1150      09/09/2016 -  Chemotherapy    She received chemotherapy with carboplatin and Taxol      09/30/2016 Tumor Marker    Patient's tumor was tested for the following markers: CA125 Results of the tumor marker test revealed 338.1      10/28/2016 Tumor Marker    Patient's tumor was tested for the following markers: CA125 Results of the tumor marker test revealed 210.9      11/08/2016 Imaging    CT scan abdomen and pelvis 1. Soft tissue nodularity throughout the omental and perihepatic fat worrisome for peritoneal carcinomatosis. No ascites. 2.  No other definitive evidence of metastatic disease. There are prominent left pelvic sidewall and right inguinal lymph nodes. 3. Nonspecific findings in the uterus, likely related to treated tumor and fibroids. Prior pelvic imaging unavailable. 4. Cholelithiasis.       INTERVAL HISTORY: Please see below for  problem oriented charting. She returns for cycle 5 of treatment. She has very mild peripheral neuropathy from treatment She complain of recent fatigue, resolved. She denies nausea or vomiting. No recent constipation. She had brief episode of vaginal bleeding 2 weeks ago, resolved spontaneously.  REVIEW OF SYSTEMS:   Constitutional: Denies fevers, chills or abnormal weight loss Eyes: Denies blurriness of vision Ears, nose, mouth, throat, and face: Denies mucositis or sore throat Respiratory: Denies cough, dyspnea or wheezes Cardiovascular: Denies palpitation, chest discomfort or lower extremity swelling Gastrointestinal:  Denies nausea, heartburn or change in bowel habits Skin: Denies abnormal skin rashes Lymphatics: Denies new lymphadenopathy or easy bruising Neurological:Denies numbness, tingling or new weaknesses Behavioral/Psych: Mood is stable, no new changes  All other systems were reviewed with the patient and are negative.  I have reviewed the past medical history, past surgical history, social history and family history with the patient and they are unchanged from previous note.  ALLERGIES:  has No Known Allergies.  MEDICATIONS:  Current Outpatient Prescriptions  Medication Sig Dispense Refill  . dexamethasone (DECADRON) 4 MG tablet Take 10 tablets at 6 am on the day of chemo only, every 3 weeks 30 tablet 1  . divalproex (DEPAKOTE ER) 500 MG 24 hr tablet Take by mouth daily.     Marland Kitchen HYDROcodone-acetaminophen (NORCO/VICODIN) 5-325 MG tablet Take 1 tablet by mouth twice a day as needed for pain or three times daily as needed    . HYDROcodone-acetaminophen (NORCO/VICODIN) 5-325 MG tablet Take 1 tablet by mouth every 6 (six) hours as needed for moderate pain. 30 tablet 0  . ibuprofen (ADVIL,MOTRIN) 200 MG tablet Take 200 mg by mouth every 6 (six) hours as needed.    . lidocaine-prilocaine (EMLA) cream Apply to affected area once 30 g 3  . omega-3 acid ethyl esters (LOVAZA) 1 g  capsule Take by mouth.    . ondansetron (ZOFRAN) 8 MG tablet Take 1 tablet (8 mg total) by mouth 2 (two) times daily as needed for refractory nausea / vomiting. Start on day 3 after chemo. 30 tablet 1  . prochlorperazine (COMPAZINE) 10 MG tablet Take 1 tablet (10 mg total) by mouth every 6 (six) hours as needed (Nausea or vomiting). 30 tablet 1   No current facility-administered medications for this visit.     PHYSICAL EXAMINATION: ECOG PERFORMANCE STATUS: 1 - Symptomatic but completely ambulatory  Vitals:   12/09/16 1023  BP: 122/76  Pulse: 88  Resp: 18  Temp: 98.2 F (36.8 C)  SpO2: 97%   Filed Weights   12/09/16 1023  Weight: 193 lb 14.4 oz (88 kg)    GENERAL:alert, no distress and comfortable SKIN: skin color, texture, turgor are normal, no rashes or significant lesions EYES: normal, Conjunctiva are pink and non-injected, sclera clear OROPHARYNX:no exudate, no erythema and lips, buccal mucosa, and tongue normal  NECK: supple, thyroid normal size, non-tender, without nodularity LYMPH:  no palpable lymphadenopathy in the cervical, axillary or inguinal LUNGS: clear to auscultation and percussion with normal breathing effort HEART: regular rate & rhythm and no murmurs and no lower extremity edema ABDOMEN:abdomen soft, non-tender and normal bowel sounds Musculoskeletal:no cyanosis of digits and no clubbing  NEURO: alert & oriented  x 3 with fluent speech, no focal motor/sensory deficits  LABORATORY DATA:  I have reviewed the data as listed    Component Value Date/Time   NA 140 11/18/2016 0826   K 3.6 11/18/2016 0826   CL 106 09/05/2016 1026   CO2 26 11/18/2016 0826   GLUCOSE 111 11/18/2016 0826   BUN 6.8 (L) 11/18/2016 0826   CREATININE 0.9 11/18/2016 0826   CALCIUM 9.6 11/18/2016 0826   PROT 7.6 11/18/2016 0826   ALBUMIN 3.3 (L) 11/18/2016 0826   AST 26 11/18/2016 0826   ALT 39 11/18/2016 0826   ALKPHOS 191 (H) 11/18/2016 0826   BILITOT 0.49 11/18/2016 0826    GFRNONAA >60 09/05/2016 1026   GFRAA >60 09/05/2016 1026    No results found for: SPEP, UPEP  Lab Results  Component Value Date   WBC 9.4 12/09/2016   NEUTROABS 8.5 (H) 12/09/2016   HGB 10.6 (L) 12/09/2016   HCT 33.2 (L) 12/09/2016   MCV 88.8 12/09/2016   PLT 263 12/09/2016      Chemistry      Component Value Date/Time   NA 140 11/18/2016 0826   K 3.6 11/18/2016 0826   CL 106 09/05/2016 1026   CO2 26 11/18/2016 0826   BUN 6.8 (L) 11/18/2016 0826   CREATININE 0.9 11/18/2016 0826      Component Value Date/Time   CALCIUM 9.6 11/18/2016 0826   ALKPHOS 191 (H) 11/18/2016 0826   AST 26 11/18/2016 0826   ALT 39 11/18/2016 0826   BILITOT 0.49 11/18/2016 0826     ASSESSMENT & PLAN:  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  She had nothing since then.  She denies pelvic pain. CT imaging study from July 2018 showed very good response to treatment After significant discussion at tumor board, our plan would be to proceed with 3 more cycles of chemotherapy before repeat CT imaging and to consider then whether radiation treatment or surgical resection would be appropriate. I will see her back prior to cycle 6 of treatment. I plan to repeat staging CT scan of the chest, abdomen and pelvis before the end of September We will get her case presented again at the GYN oncology tumor board on September 24th to determine the plan for surgery or radiation.   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  We will monitor closely.   Orders Placed This Encounter  Procedures  . CT Abdomen  W Contrast    Standing Status:   Future    Standing Expiration Date:   12/09/2017    Order Specific Question:   If indicated for the ordered procedure, I authorize the administration of contrast media per Radiology protocol    Answer:   Yes    Order Specific Question:   Preferred imaging location?    Answer:   [facility]    Order Specific Question:   Radiology Contrast Protocol - do NOT remove file path    Answer:   \\[email or web address]  . CT Chest W Contrast    Standing Status:   Future    Standing Expiration Date:   12/09/2017    Order Specific Question:  If indicated for the ordered procedure, I authorize the administration of contrast media per Radiology protocol    Answer:   Yes    Order Specific Question:   Preferred imaging location?    Answer:   [facility]    Order Specific Question:   Radiology Contrast Protocol - do NOT remove file path    Answer:   \\[email or web address]   All questions were answered. The patient knows to call the clinic with any problems, questions or concerns. No barriers to learning was detected. I spent 20 minutes counseling the patient face to face. The total time spent in the appointment was 25 minutes and more than 50% was on counseling and review of test results     Heath Lark, MD 12/09/2016 10:29 AM

## 2016-12-10 LAB — CA 125: CANCER ANTIGEN (CA) 125: 98.1 U/mL — AB (ref 0.0–38.1)

## 2016-12-18 ENCOUNTER — Telehealth: Payer: Self-pay | Admitting: *Deleted

## 2016-12-18 MED ORDER — HYDROCODONE-ACETAMINOPHEN 5-325 MG PO TABS: 1.0000 | ORAL_TABLET | Freq: Four times a day (QID) | ORAL | 0 refills | Status: DC | PRN

## 2016-12-18 NOTE — Telephone Encounter (Signed)
Son states his mother needs a refill of her Hydrocodone.

## 2016-12-26 ENCOUNTER — Telehealth: Payer: Self-pay | Admitting: *Deleted

## 2016-12-26 NOTE — Telephone Encounter (Signed)
FYI "Shaketa with Central scheduling.  Unable to reach this patient to schedule CT chest/abd before F/U.  Are there any other contact phone numbers?  Tried [phone] 'voicemail is full'.  Called [phone] "this black magic is not available'." Provided emergency contact information for son, Lucas Mallow however this is same as patient.  No further phone information per EPIC EMR.    This nurse called, son answered, provided Radiology Centralized Scheduling phone number with instructions to call to schedule CT C/A before scheduled F/U on 12-30-2016.

## 2016-12-30 ENCOUNTER — Ambulatory Visit: Payer: Medicare Other

## 2016-12-30 ENCOUNTER — Ambulatory Visit (HOSPITAL_BASED_OUTPATIENT_CLINIC_OR_DEPARTMENT_OTHER): Payer: Medicare Other | Admitting: Hematology and Oncology

## 2016-12-30 ENCOUNTER — Encounter: Payer: Self-pay | Admitting: Hematology and Oncology

## 2016-12-30 ENCOUNTER — Telehealth: Payer: Self-pay

## 2016-12-30 ENCOUNTER — Other Ambulatory Visit (HOSPITAL_BASED_OUTPATIENT_CLINIC_OR_DEPARTMENT_OTHER): Payer: Medicare Other

## 2016-12-30 ENCOUNTER — Ambulatory Visit (HOSPITAL_BASED_OUTPATIENT_CLINIC_OR_DEPARTMENT_OTHER): Payer: Medicare Other

## 2016-12-30 DIAGNOSIS — C541 Malignant neoplasm of endometrium: Secondary | ICD-10-CM

## 2016-12-30 DIAGNOSIS — Z5111 Encounter for antineoplastic chemotherapy: Secondary | ICD-10-CM

## 2016-12-30 DIAGNOSIS — G620 Drug-induced polyneuropathy: Secondary | ICD-10-CM | POA: Diagnosis not present

## 2016-12-30 DIAGNOSIS — D638 Anemia in other chronic diseases classified elsewhere: Secondary | ICD-10-CM | POA: Diagnosis not present

## 2016-12-30 DIAGNOSIS — T451X5A Adverse effect of antineoplastic and immunosuppressive drugs, initial encounter: Secondary | ICD-10-CM

## 2016-12-30 LAB — COMPREHENSIVE METABOLIC PANEL
ALK PHOS: 122 U/L (ref 40–150)
ALT: 9 U/L (ref 0–55)
ANION GAP: 12 meq/L — AB (ref 3–11)
AST: 19 U/L (ref 5–34)
Albumin: 3.5 g/dL (ref 3.5–5.0)
BILIRUBIN TOTAL: 0.93 mg/dL (ref 0.20–1.20)
BUN: 5.8 mg/dL — ABNORMAL LOW (ref 7.0–26.0)
CALCIUM: 9.5 mg/dL (ref 8.4–10.4)
CO2: 26 mEq/L (ref 22–29)
CREATININE: 0.8 mg/dL (ref 0.6–1.1)
Chloride: 102 mEq/L (ref 98–109)
EGFR: 72 mL/min/{1.73_m2} — AB (ref >90)
Glucose: 117 mg/dl (ref 70–140)
Potassium: 3.4 mEq/L — ABNORMAL LOW (ref 3.5–5.1)
Sodium: 140 mEq/L (ref 136–145)
TOTAL PROTEIN: 8.1 g/dL (ref 6.4–8.3)

## 2016-12-30 LAB — CBC WITH DIFFERENTIAL/PLATELET
BASO%: 0.3 % (ref 0.0–2.0)
Basophils Absolute: 0 10*3/uL (ref 0.0–0.1)
EOS ABS: 0 10*3/uL (ref 0.0–0.5)
EOS%: 0.1 % (ref 0.0–7.0)
HEMATOCRIT: 33.3 % — AB (ref 34.8–46.6)
HGB: 11 g/dL — ABNORMAL LOW (ref 11.6–15.9)
LYMPH#: 0.7 10*3/uL — AB (ref 0.9–3.3)
LYMPH%: 6.7 % — ABNORMAL LOW (ref 14.0–49.7)
MCH: 28.8 pg (ref 25.1–34.0)
MCHC: 32.9 g/dL (ref 31.5–36.0)
MCV: 87.6 fL (ref 79.5–101.0)
MONO#: 0.2 10*3/uL (ref 0.1–0.9)
MONO%: 1.7 % (ref 0.0–14.0)
NEUT%: 91.2 % — AB (ref 38.4–76.8)
NEUTROS ABS: 9.2 10*3/uL — AB (ref 1.5–6.5)
PLATELETS: 262 10*3/uL (ref 145–400)
RBC: 3.8 10*6/uL (ref 3.70–5.45)
RDW: 18.6 % — ABNORMAL HIGH (ref 11.2–14.5)
WBC: 10.1 10*3/uL (ref 3.9–10.3)

## 2016-12-30 MED ORDER — PALONOSETRON HCL INJECTION 0.25 MG/5ML
INTRAVENOUS | Status: AC
  Filled 2016-12-30: qty 5

## 2016-12-30 MED ORDER — FAMOTIDINE IN NACL 20-0.9 MG/50ML-% IV SOLN
INTRAVENOUS | Status: AC
  Filled 2016-12-30: qty 50

## 2016-12-30 MED ORDER — PALONOSETRON HCL INJECTION 0.25 MG/5ML
0.2500 mg | Freq: Once | INTRAVENOUS | Status: AC
  Administered 2016-12-30: 0.25 mg via INTRAVENOUS

## 2016-12-30 MED ORDER — FAMOTIDINE IN NACL 20-0.9 MG/50ML-% IV SOLN
20.0000 mg | Freq: Once | INTRAVENOUS | Status: AC
  Administered 2016-12-30: 20 mg via INTRAVENOUS

## 2016-12-30 MED ORDER — SODIUM CHLORIDE 0.9 % IV SOLN
Freq: Once | INTRAVENOUS | Status: AC
  Administered 2016-12-30: 11:00:00 via INTRAVENOUS

## 2016-12-30 MED ORDER — DIPHENHYDRAMINE HCL 50 MG/ML IJ SOLN
INTRAMUSCULAR | Status: AC
Start: 2016-12-30 — End: 2016-12-30
  Filled 2016-12-30: qty 1

## 2016-12-30 MED ORDER — PACLITAXEL CHEMO INJECTION 300 MG/50ML
131.2500 mg/m2 | Freq: Once | INTRAVENOUS | Status: AC
  Administered 2016-12-30: 270 mg via INTRAVENOUS
  Filled 2016-12-30: qty 45

## 2016-12-30 MED ORDER — SODIUM CHLORIDE 0.9% FLUSH
10.0000 mL | INTRAVENOUS | Status: DC | PRN
  Administered 2016-12-30: 10 mL
  Filled 2016-12-30: qty 10

## 2016-12-30 MED ORDER — HEPARIN SOD (PORK) LOCK FLUSH 100 UNIT/ML IV SOLN
500.0000 [IU] | Freq: Once | INTRAVENOUS | Status: AC | PRN
  Administered 2016-12-30: 500 [IU]
  Filled 2016-12-30: qty 5

## 2016-12-30 MED ORDER — SODIUM CHLORIDE 0.9 % IV SOLN
20.0000 mg | Freq: Once | INTRAVENOUS | Status: AC
  Administered 2016-12-30: 20 mg via INTRAVENOUS
  Filled 2016-12-30: qty 2

## 2016-12-30 MED ORDER — SODIUM CHLORIDE 0.9 % IV SOLN
640.0000 mg | Freq: Once | INTRAVENOUS | Status: AC
  Administered 2016-12-30: 640 mg via INTRAVENOUS
  Filled 2016-12-30: qty 64

## 2016-12-30 MED ORDER — SODIUM CHLORIDE 0.9% FLUSH
10.0000 mL | Freq: Once | INTRAVENOUS | Status: AC
  Administered 2016-12-30: 10 mL
  Filled 2016-12-30: qty 10

## 2016-12-30 MED ORDER — DIPHENHYDRAMINE HCL 50 MG/ML IJ SOLN
50.0000 mg | Freq: Once | INTRAMUSCULAR | Status: AC
  Administered 2016-12-30: 50 mg via INTRAVENOUS

## 2016-12-30 NOTE — Patient Instructions (Signed)
[facility] Discharge Instructions for Patients Receiving Chemotherapy  Today you received the following chemotherapy agents Taxol/Carboplatin  To help prevent nausea and vomiting after your treatment, we encourage you to take your nausea medication    If you develop nausea and vomiting that is not controlled by your nausea medication, call the clinic.   BELOW ARE SYMPTOMS THAT SHOULD BE REPORTED IMMEDIATELY:  *FEVER GREATER THAN 100.5 F  *CHILLS WITH OR WITHOUT FEVER  NAUSEA AND VOMITING THAT IS NOT CONTROLLED WITH YOUR NAUSEA MEDICATION  *UNUSUAL SHORTNESS OF BREATH  *UNUSUAL BRUISING OR BLEEDING  TENDERNESS IN MOUTH AND THROAT WITH OR WITHOUT PRESENCE OF ULCERS  *URINARY PROBLEMS  *BOWEL PROBLEMS  UNUSUAL RASH Items with * indicate a potential emergency and should be followed up as soon as possible.  Feel free to call the clinic you have any questions or concerns. The clinic phone number is [phone].  Please show the CHEMO ALERT CARD at check-in to the Emergency Department and triage nurse.   

## 2016-12-30 NOTE — Assessment & Plan Note (Signed)
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  We will monitor closely.

## 2016-12-30 NOTE — Patient Instructions (Signed)
Implanted Port Home Guide An implanted port is a type of central line that is placed under the skin. Central lines are used to provide IV access when treatment or nutrition needs to be given through a person's veins. Implanted ports are used for long-term IV access. An implanted port may be placed because:  You need IV medicine that would be irritating to the small veins in your hands or arms.  You need long-term IV medicines, such as antibiotics.  You need IV nutrition for a long period.  You need frequent blood draws for lab tests.  You need dialysis.  Implanted ports are usually placed in the chest area, but they can also be placed in the upper arm, the abdomen, or the leg. An implanted port has two main parts:  Reservoir. The reservoir is round and will appear as a small, raised area under your skin. The reservoir is the part where a needle is inserted to give medicines or draw blood.  Catheter. The catheter is a thin, flexible tube that extends from the reservoir. The catheter is placed into a large vein. Medicine that is inserted into the reservoir goes into the catheter and then into the vein.  How will I care for my incision site? Do not get the incision site wet. Bathe or shower as directed by your health care provider. How is my port accessed? Special steps must be taken to access the port:  Before the port is accessed, a numbing cream can be placed on the skin. This helps numb the skin over the port site.  Your health care provider uses a sterile technique to access the port. ? Your health care provider must put on a mask and sterile gloves. ? The skin over your port is cleaned carefully with an antiseptic and allowed to dry. ? The port is gently pinched between sterile gloves, and a needle is inserted into the port.  Only "non-coring" port needles should be used to access the port. Once the port is accessed, a blood return should be checked. This helps ensure that the port  is in the vein and is not clogged.  If your port needs to remain accessed for a constant infusion, a clear (transparent) bandage will be placed over the needle site. The bandage and needle will need to be changed every week, or as directed by your health care provider.  Keep the bandage covering the needle clean and dry. Do not get it wet. Follow your health care provider's instructions on how to take a shower or bath while the port is accessed.  If your port does not need to stay accessed, no bandage is needed over the port.  What is flushing? Flushing helps keep the port from getting clogged. Follow your health care provider's instructions on how and when to flush the port. Ports are usually flushed with saline solution or a medicine called heparin. The need for flushing will depend on how the port is used.  If the port is used for intermittent medicines or blood draws, the port will need to be flushed: ? After medicines have been given. ? After blood has been drawn. ? As part of routine maintenance.  If a constant infusion is running, the port may not need to be flushed.  How long will my port stay implanted? The port can stay in for as long as your health care provider thinks it is needed. When it is time for the port to come out, surgery will be   done to remove it. The procedure is similar to the one performed when the port was put in. When should I seek immediate medical care? When you have an implanted port, you should seek immediate medical care if:  You notice a bad smell coming from the incision site.  You have swelling, redness, or drainage at the incision site.  You have more swelling or pain at the port site or the surrounding area.  You have a fever that is not controlled with medicine.  This information is not intended to replace advice given to you by your health care provider. Make sure you discuss any questions you have with your health care provider. Document  Released: 04/08/2005 Document Revised: 09/14/2015 Document Reviewed: 12/14/2012 Elsevier Interactive Patient Education  2017 [employer]  

## 2016-12-30 NOTE — Progress Notes (Signed)
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 However, she was transferred to [facility] on 08/06/16 for suicidal ideation. There, GYN was consulted and ordered biopsy. Pt was transferred to medical unit for blood transfusion.       08/10/2016 Imaging    US pelvis:  The uterus is prominent and heterogeneous measuring 10.9 x 6.3 x 6.1 cm. There is a probable anterior exophytic fundal fibroid measuring 4.5 cm. The endometrial stripe is normal in thickness at 6.4 mm. I see no fluid in the canal  Right ovary is not visible. Left ovary 2.3 x 1.7 x 1.9 cm with normal echogenicity. Color flow and spectral Doppler: Normal arterial inflow and venous outflow.  There is no significant free fluid.   I see no adnexal masses.      08/11/2016 - 08/15/2016 Hospital Admission    She was admitted voluntarily to Psychiatry unit 08/06/2016 for suicidal ideation.She was evaluated in an outside facility due to vaginal bleeding and endometrial and endocervical biopsies performed. Pathology revealed a poorly differentiated cancer with positive estrogen receptors felt to be suggestive of endometrial cancer. The patient received 2 units of prbcs total during her stay for anemia.        08/11/2016 Pathology Results       1. Endocervix, curettings and polyp. (*please see comment below). --High-grade carcinoma, undifferentiated.  2. Endometrial biopsy:(*please see comment below). --High-grade carcinoma, undifferentiated. --Weakly positive for estrogen receptors suggestive of endometrial origin; Negative for progesterone receptors and neutral mucin..       08/14/2016 Imaging    CT imaging 1. Uterine  mass and abnormality consistent with clinical history of endometrial carcinoma as described above with bilateral pelvic and retroperitoneal adenopathy consistent with nodal metastasis. 2. No evidence of nonnodal distant metastasis. 3. Nonspecific incidental finding of hypertrophy of the pylorus muscle. 4. incidental finding of cholelithiasis      09/04/2016 Imaging    CT chest: 1. 4 mm nodule in the anterior aspect of the right upper lobe. This is highly nonspecific. Although metastatic disease is not excluded, it is not strongly favored. Attention under routine followup examinations is recommended to ensure the stability or resolution of this finding. 2. Trace right pleural effusion lying dependently is simple in appearance. 3. Mild cardiomegaly.      09/05/2016 Procedure    Successful placement of a right internal jugular approach power injectable Port-A-Cath. The catheter is ready for immediate use.      09/05/2016 Tumor Marker    Patient's tumor was tested for the following markers: CA125 Results of the tumor marker test revealed 1150      09/09/2016 -  Chemotherapy    She received chemotherapy with carboplatin and Taxol      09/30/2016 Tumor Marker    Patient's tumor was tested for the following markers: CA125 Results of the tumor marker test revealed 338.1      10/28/2016 Tumor Marker    Patient's tumor was tested for the following markers: CA125 Results of the tumor marker test revealed 210.9      11/08/2016 Imaging    CT scan abdomen and pelvis 1. Soft tissue nodularity throughout the omental and perihepatic fat worrisome for peritoneal carcinomatosis. No ascites. 2.  No other definitive evidence of metastatic disease. There are prominent left pelvic sidewall and right inguinal lymph nodes. 3. Nonspecific findings in the uterus, likely related to treated tumor and fibroids. Prior pelvic imaging unavailable. 4. Cholelithiasis.      12/09/2016 Tumor Marker    Patient's tumor was  tested for the following markers: CA125 Results of the tumor marker test revealed 98.1       INTERVAL HISTORY: Please see below for problem oriented charting. She returns with her son for further follow-up and last cycle of chemo She continues to have minor peripheral neuropathy, not significantly worse She have lost some weight but she claims she is eating healthy diet She denies mucositis, nausea or vomiting Denies further vaginal bleeding.  REVIEW OF SYSTEMS:   Constitutional: Denies fevers, chills or abnormal weight loss Eyes: Denies blurriness of vision Ears, nose, mouth, throat, and face: Denies mucositis or sore throat Respiratory: Denies cough, dyspnea or wheezes Cardiovascular: Denies palpitation, chest discomfort or lower extremity swelling Gastrointestinal:  Denies nausea, heartburn or change in bowel habits Skin: Denies abnormal skin rashes Lymphatics: Denies new lymphadenopathy or easy bruising Neurological:Denies numbness, tingling or new weaknesses Behavioral/Psych: Mood is stable, no new changes  All other systems were reviewed with the patient and are negative.  I have reviewed the past medical history, past surgical history, social history and family history with the patient and they are unchanged from previous note.  ALLERGIES:  has No Known Allergies.  MEDICATIONS:  Current Outpatient Prescriptions  Medication Sig Dispense Refill  . dexamethasone (DECADRON) 4 MG tablet Take 10 tablets at 6 am on the day of chemo only, every 3 weeks 30 tablet 1  . divalproex (DEPAKOTE ER) 500 MG 24 hr tablet Take by mouth daily.     Marland Kitchen HYDROcodone-acetaminophen (NORCO/VICODIN) 5-325 MG tablet Take 1 tablet by mouth every 6 (six) hours as needed for moderate pain. 30 tablet 0  . ibuprofen (ADVIL,MOTRIN) 200 MG tablet Take 200 mg by mouth every 6 (six) hours as needed.    . lidocaine-prilocaine (EMLA) cream Apply to affected area once 30 g 3  . omega-3 acid ethyl esters (LOVAZA)  1 g capsule Take by mouth.    . ondansetron (ZOFRAN) 8 MG tablet Take 1 tablet (8 mg total) by mouth 2 (two) times daily as needed for refractory nausea / vomiting. Start on day 3 after chemo. 30 tablet 1  . prochlorperazine (COMPAZINE) 10 MG tablet Take 1 tablet (10 mg total) by mouth every 6 (six) hours as needed (Nausea or vomiting). 30 tablet 1   No current facility-administered medications for this visit.    Facility-Administered Medications Ordered in Other Visits  Medication Dose Route Frequency Provider Last Rate Last Dose  . CARBOplatin (PARAPLATIN) 640 mg in sodium chloride 0.9 % 250 mL chemo infusion  640 mg Intravenous Once Alvy Bimler, Shannie Kontos, MD      . heparin lock flush 100 unit/mL  500 Units Intracatheter Once PRN Alvy Bimler, Shelsea Hangartner, MD      . PACLitaxel (TAXOL) 270 mg in dextrose 5 % 250 mL chemo infusion (> 80mg /m2)  131.25 mg/m2 (Treatment Plan Recorded) Intravenous Once Heath Lark, MD 98 mL/hr at 12/30/16 1233 270 mg at 12/30/16 1233  . sodium chloride flush (NS) 0.9 % injection 10 mL  10 mL Intracatheter PRN Alvy Bimler, Keishaun Hazel, MD        PHYSICAL EXAMINATION: ECOG PERFORMANCE STATUS: 1 - Symptomatic but completely ambulatory  Vitals:   12/30/16 1034  BP: 131/70  Pulse:  89  Resp: 20  Temp: 98 F (36.7 C)  SpO2: 99%   Filed Weights   12/30/16 1034  Weight: 187 lb 9.6 oz (85.1 kg)    GENERAL:alert, no distress and comfortable SKIN: skin color, texture, turgor are normal, no rashes or significant lesions EYES: normal, Conjunctiva are pink and non-injected, sclera clear OROPHARYNX:no exudate, no erythema and lips, buccal mucosa, and tongue normal  NECK: supple, thyroid normal size, non-tender, without nodularity LYMPH:  no palpable lymphadenopathy in the cervical, axillary or inguinal LUNGS: clear to auscultation and percussion with normal breathing effort HEART: regular rate & rhythm and no murmurs and no lower extremity edema ABDOMEN:abdomen soft, non-tender and normal bowel  sounds Musculoskeletal:no cyanosis of digits and no clubbing  NEURO: alert & oriented x 3 with fluent speech, no focal motor/sensory deficits  LABORATORY DATA:  I have reviewed the data as listed    Component Value Date/Time   NA 140 12/30/2016 1003   K 3.4 (L) 12/30/2016 1003   CL 106 09/05/2016 1026   CO2 26 12/30/2016 1003   GLUCOSE 117 12/30/2016 1003   BUN 5.8 (L) 12/30/2016 1003   CREATININE 0.8 12/30/2016 1003   CALCIUM 9.5 12/30/2016 1003   PROT 8.1 12/30/2016 1003   ALBUMIN 3.5 12/30/2016 1003   AST 19 12/30/2016 1003   ALT 9 12/30/2016 1003   ALKPHOS 122 12/30/2016 1003   BILITOT 0.93 12/30/2016 1003   GFRNONAA >60 09/05/2016 1026   GFRAA >60 09/05/2016 1026    No results found for: SPEP, UPEP  Lab Results  Component Value Date   WBC 10.1 12/30/2016   NEUTROABS 9.2 (H) 12/30/2016   HGB 11.0 (L) 12/30/2016   HCT 33.3 (L) 12/30/2016   MCV 87.6 12/30/2016   PLT 262 12/30/2016      Chemistry      Component Value Date/Time   NA 140 12/30/2016 1003   K 3.4 (L) 12/30/2016 1003   CL 106 09/05/2016 1026   CO2 26 12/30/2016 1003   BUN 5.8 (L) 12/30/2016 1003   CREATININE 0.8 12/30/2016 1003      Component Value Date/Time   CALCIUM 9.5 12/30/2016 1003   ALKPHOS 122 12/30/2016 1003   AST 19 12/30/2016 1003   ALT 9 12/30/2016 1003   BILITOT 0.93 12/30/2016 1003      ASSESSMENT & PLAN:  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  We will monitor closely.   No orders of the defined types were placed in this encounter.  All questions were answered. The patient knows to call the clinic with any problems, questions or concerns. No barriers to learning was detected. I spent 15 minutes counseling the patient face to face. The total time spent in the  appointment was 20 minutes and more than 50% was on counseling and review of test results     Heath Lark, MD 12/30/2016 1:30 PM

## 2016-12-30 NOTE — Assessment & Plan Note (Signed)
The cause of her anemia is multifactorial, likely anemia chronic illness and from recent chemotherapy She is not symptomatic We will monitor closely

## 2016-12-30 NOTE — Assessment & Plan Note (Signed)
She tolerated chemotherapy well without major side effects CT imaging study from July 2018 showed very good response to treatment After significant discussion at tumor board, our plan would be to proceed with last cycle of chemotherapy before repeat CT imaging and to consider then whether radiation treatment or surgical resection would be appropriate. I will see her back next week to review imaging study results Due to mild peripheral neuropathy and recent abnormal LFTs, she will continue on minor dose adjustment

## 2016-12-30 NOTE — Telephone Encounter (Signed)
Gave patient avs and calender for 9/19. Per los

## 2016-12-31 LAB — CA 125: Cancer Antigen (CA) 125: 106.1 U/mL — ABNORMAL HIGH (ref 0.0–38.1)

## 2017-01-06 ENCOUNTER — Telehealth: Payer: Self-pay | Admitting: *Deleted

## 2017-01-06 NOTE — Telephone Encounter (Signed)
Contacted the patient's son and scheduled appt for October 1st at 11:30am.

## 2017-01-07 ENCOUNTER — Encounter (HOSPITAL_COMMUNITY): Payer: Self-pay

## 2017-01-07 ENCOUNTER — Other Ambulatory Visit: Payer: Self-pay | Admitting: Hematology and Oncology

## 2017-01-07 ENCOUNTER — Ambulatory Visit (HOSPITAL_COMMUNITY)
Admission: RE | Admit: 2017-01-07 | Discharge: 2017-01-07 | Disposition: A | Discharge status: Home / Self Care | Payer: Medicare Other | Source: Ambulatory Visit | Attending: Hematology and Oncology | Admitting: Hematology and Oncology

## 2017-01-07 DIAGNOSIS — R935 Abnormal findings on diagnostic imaging of other abdominal regions, including retroperitoneum: Secondary | ICD-10-CM | POA: Diagnosis not present

## 2017-01-07 DIAGNOSIS — R911 Solitary pulmonary nodule: Secondary | ICD-10-CM | POA: Diagnosis not present

## 2017-01-07 DIAGNOSIS — C541 Malignant neoplasm of endometrium: Secondary | ICD-10-CM | POA: Insufficient documentation

## 2017-01-07 DIAGNOSIS — K8020 Calculus of gallbladder without cholecystitis without obstruction: Secondary | ICD-10-CM | POA: Insufficient documentation

## 2017-01-07 MED ORDER — IOPAMIDOL (ISOVUE-300) INJECTION 61%
100.0000 mL | Freq: Once | INTRAVENOUS | Status: AC | PRN
Start: 2017-01-07 — End: 2017-01-07
  Administered 2017-01-07: 100 mL via INTRAVENOUS

## 2017-01-07 MED ORDER — IOPAMIDOL (ISOVUE-300) INJECTION 61%
INTRAVENOUS | Status: AC
  Administered 2017-01-07: 100 mL via INTRAVENOUS
  Filled 2017-01-07: qty 100

## 2017-01-08 ENCOUNTER — Ambulatory Visit (HOSPITAL_BASED_OUTPATIENT_CLINIC_OR_DEPARTMENT_OTHER): Payer: Medicare Other | Admitting: Hematology and Oncology

## 2017-01-08 ENCOUNTER — Encounter: Payer: Self-pay | Admitting: Hematology and Oncology

## 2017-01-08 DIAGNOSIS — T451X5A Adverse effect of antineoplastic and immunosuppressive drugs, initial encounter: Secondary | ICD-10-CM

## 2017-01-08 DIAGNOSIS — C541 Malignant neoplasm of endometrium: Secondary | ICD-10-CM | POA: Diagnosis not present

## 2017-01-08 DIAGNOSIS — G620 Drug-induced polyneuropathy: Secondary | ICD-10-CM | POA: Diagnosis not present

## 2017-01-08 NOTE — Progress Notes (Signed)
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 However, she was transferred to [facility] on 08/06/16 for suicidal ideation. There, GYN was consulted and ordered biopsy. Pt was transferred to medical unit for blood transfusion.       08/10/2016 Imaging    US pelvis:  The uterus is prominent and heterogeneous measuring 10.9 x 6.3 x 6.1 cm. There is a probable anterior exophytic fundal fibroid measuring 4.5 cm. The endometrial stripe is normal in thickness at 6.4 mm. I see no fluid in the canal  Right ovary is not visible. Left ovary 2.3 x 1.7 x 1.9 cm with normal echogenicity. Color flow and spectral Doppler: Normal arterial inflow and venous outflow.  There is no significant free fluid.   I see no adnexal masses.      08/11/2016 - 08/15/2016 Hospital Admission    She was admitted voluntarily to Psychiatry unit 08/06/2016 for suicidal ideation.She was evaluated in an outside facility due to vaginal bleeding and endometrial and endocervical biopsies performed. Pathology revealed a poorly differentiated cancer with positive estrogen receptors felt to be suggestive of endometrial cancer. The patient received 2 units of prbcs total during her stay for anemia.        08/11/2016 Pathology Results       1. Endocervix, curettings and polyp. (*please see comment below). --High-grade carcinoma, undifferentiated.  2. Endometrial biopsy:(*please see comment below). --High-grade carcinoma, undifferentiated. --Weakly positive for estrogen receptors suggestive of endometrial origin; Negative for progesterone receptors and neutral mucin..       08/14/2016 Imaging    CT imaging 1. Uterine mass  and abnormality consistent with clinical history of endometrial carcinoma as described above with bilateral pelvic and retroperitoneal adenopathy consistent with nodal metastasis. 2. No evidence of nonnodal distant metastasis. 3. Nonspecific incidental finding of hypertrophy of the pylorus muscle. 4. incidental finding of cholelithiasis      09/04/2016 Imaging    CT chest: 1. 4 mm nodule in the anterior aspect of the right upper lobe. This is highly nonspecific. Although metastatic disease is not excluded, it is not strongly favored. Attention under routine followup examinations is recommended to ensure the stability or resolution of this finding. 2. Trace right pleural effusion lying dependently is simple in appearance. 3. Mild cardiomegaly.      09/05/2016 Procedure    Successful placement of a right internal jugular approach power injectable Port-A-Cath. The catheter is ready for immediate use.      09/05/2016 Tumor Marker    Patient's tumor was tested for the following markers: CA125 Results of the tumor marker test revealed 1150      09/09/2016 - 12/30/2016 Chemotherapy    She received chemotherapy with carboplatin and Taxol x 6 cycles      09/30/2016 Tumor Marker    Patient's tumor was tested for the following markers: CA125 Results of the tumor marker test revealed 338.1      10/28/2016 Tumor Marker    Patient's tumor was tested for the following markers: CA125 Results of the tumor marker test revealed 210.9      11/08/2016 Imaging    CT scan abdomen and pelvis 1. Soft tissue nodularity throughout the omental and perihepatic fat worrisome for peritoneal carcinomatosis.  No ascites. 2. No other definitive evidence of metastatic disease. There are prominent left pelvic sidewall and right inguinal lymph nodes. 3. Nonspecific findings in the uterus, likely related to treated tumor and fibroids. Prior pelvic imaging unavailable. 4. Cholelithiasis.      12/09/2016 Tumor Marker     Patient's tumor was tested for the following markers: CA125 Results of the tumor marker test revealed 98.1      12/30/2016 Tumor Marker    Patient's tumor was tested for the following markers: CA125 Results of the tumor marker test revealed 106.1      01/07/2017 Imaging    1. Stable soft tissue nodularity along the omentum suspicious for peritoneal spread of tumor. Stable appearance of nodularity along the uterine fundus and indistinctness along the right adnexa, some of this may be due to fibroids. 2. The previously borderline enlarged left external iliac lymph node is now normal in size. 3. Stable 4 mm ill-defined right upper lobe pulmonary nodule anteriorly, this may well in depth being benign but merits surveillance. 4. Cholelithiasis.       INTERVAL HISTORY: Please see below for problem oriented charting. She returns with her son for further follow-up She denies recent nausea, severe peripheral neuropathy or any form of side effects from treatment  REVIEW OF SYSTEMS:   Constitutional: Denies fevers, chills or abnormal weight loss Eyes: Denies blurriness of vision Ears, nose, mouth, throat, and face: Denies mucositis or sore throat Respiratory: Denies cough, dyspnea or wheezes Cardiovascular: Denies palpitation, chest discomfort or lower extremity swelling Gastrointestinal:  Denies nausea, heartburn or change in bowel habits Skin: Denies abnormal skin rashes Lymphatics: Denies new lymphadenopathy or easy bruising Neurological:Denies numbness, tingling or new weaknesses Behavioral/Psych: Mood is stable, no new changes  All other systems were reviewed with the patient and are negative.  I have reviewed the past medical history, past surgical history, social history and family history with the patient and they are unchanged from previous note.  ALLERGIES:  has No Known Allergies.  MEDICATIONS:  Current Outpatient Prescriptions  Medication Sig Dispense Refill  . dexamethasone  (DECADRON) 4 MG tablet Take 10 tablets at 6 am on the day of chemo only, every 3 weeks 30 tablet 1  . divalproex (DEPAKOTE ER) 500 MG 24 hr tablet Take by mouth daily.     Marland Kitchen HYDROcodone-acetaminophen (NORCO/VICODIN) 5-325 MG tablet Take 1 tablet by mouth every 6 (six) hours as needed for moderate pain. 30 tablet 0  . ibuprofen (ADVIL,MOTRIN) 200 MG tablet Take 200 mg by mouth every 6 (six) hours as needed.    . lidocaine-prilocaine (EMLA) cream Apply to affected area once 30 g 3  . omega-3 acid ethyl esters (LOVAZA) 1 g capsule Take by mouth.    . ondansetron (ZOFRAN) 8 MG tablet Take 1 tablet (8 mg total) by mouth 2 (two) times daily as needed for refractory nausea / vomiting. Start on day 3 after chemo. 30 tablet 1  . prochlorperazine (COMPAZINE) 10 MG tablet Take 1 tablet (10 mg total) by mouth every 6 (six) hours as needed (Nausea or vomiting). 30 tablet 1   No current facility-administered medications for this visit.     PHYSICAL EXAMINATION: ECOG PERFORMANCE STATUS: 0 - Asymptomatic  Vitals:   01/08/17 1210  BP: 131/67  Pulse: (!) 103  Resp: 18  Temp: 98.2 F (36.8 C)  SpO2: 99%   Filed Weights   01/08/17 1210  Weight: 186 lb 1.6 oz (84.4 kg)    GENERAL:alert, no distress  and comfortable SKIN: skin color, texture, turgor are normal, no rashes or significant lesions EYES: normal, Conjunctiva are pink and non-injected, sclera clear Musculoskeletal:no cyanosis of digits and no clubbing  NEURO: alert & oriented x 3 with fluent speech, no focal motor/sensory deficits  LABORATORY DATA:  I have reviewed the data as listed    Component Value Date/Time   NA 140 12/30/2016 1003   K 3.4 (L) 12/30/2016 1003   CL 106 09/05/2016 1026   CO2 26 12/30/2016 1003   GLUCOSE 117 12/30/2016 1003   BUN 5.8 (L) 12/30/2016 1003   CREATININE 0.8 12/30/2016 1003   CALCIUM 9.5 12/30/2016 1003   PROT 8.1 12/30/2016 1003   ALBUMIN 3.5 12/30/2016 1003   AST 19 12/30/2016 1003   ALT 9  12/30/2016 1003   ALKPHOS 122 12/30/2016 1003   BILITOT 0.93 12/30/2016 1003   GFRNONAA >60 09/05/2016 1026   GFRAA >60 09/05/2016 1026    No results found for: SPEP, UPEP  Lab Results  Component Value Date   WBC 10.1 12/30/2016   NEUTROABS 9.2 (H) 12/30/2016   HGB 11.0 (L) 12/30/2016   HCT 33.3 (L) 12/30/2016   MCV 87.6 12/30/2016   PLT 262 12/30/2016      Chemistry      Component Value Date/Time   NA 140 12/30/2016 1003   K 3.4 (L) 12/30/2016 1003   CL 106 09/05/2016 1026   CO2 26 12/30/2016 1003   BUN 5.8 (L) 12/30/2016 1003   CREATININE 0.8 12/30/2016 1003      Component Value Date/Time   CALCIUM 9.5 12/30/2016 1003   ALKPHOS 122 12/30/2016 1003   AST 19 12/30/2016 1003   ALT 9 12/30/2016 1003   BILITOT 0.93 12/30/2016 1003       RADIOGRAPHIC STUDIES:I have reviewed the scans with the patient and son I have personally reviewed the radiological images as listed and agreed with the findings in the report. Ct Chest W Contrast  Result Date: 01/07/2017 CLINICAL DATA:  Endometrial/uterine cancer diagnosed in 2018. Completed chemotherapy and radiation therapy. Restaging assessment. EXAM: CT CHEST, ABDOMEN, AND PELVIS WITH CONTRAST TECHNIQUE: Multidetector CT imaging of the chest, abdomen and pelvis was performed following the standard protocol during bolus administration of intravenous contrast. CONTRAST:  100 cc Isovue 300 COMPARISON:  09/06/2016 and 11/08/2016 CT scans FINDINGS: CT CHEST FINDINGS Cardiovascular: Right Port-A-Cath tip: SVC. Borderline cardiomegaly. Mediastinum/Nodes: Unremarkable Lungs/Pleura: Biapical pleuroparenchymal scarring slightly greater on the right than the left. Somewhat ill-defined 4 mm right upper lobe pulmonary nodule on image 49/6, stable. No pleural effusion. Musculoskeletal: Thoracic spondylosis. 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 CT ABDOMEN PELVIS FINDINGS Hepatobiliary: There are at least 3 rim calcified gallstones measuring up to  1.6 cm in diameter. No hepatic parenchymal lesion identified. Common bile duct measures approximately 6 mm in diameter, upper normal for age. Pancreas: Unremarkable Spleen: Unremarkable Adrenals/Urinary Tract: Unremarkable Stomach/Bowel: Unremarkable Vascular/Lymphatic: Left external iliac node 0.7 cm in short axis, previously 1.0 cm in short axis. Right inguinal lymph node 0.8 cm in short axis, previously 0.9 cm. Reproductive: Nonspecific masses along the fundus of the uterus, similar to the prior exam. Indistinct tissue planes along the right adnexa with the appendix tracking adjacent to the right adnexa. Other: Again observed is abnormal nodularity along the omentum suspicious for peritoneal spread of metastatic disease, no change from prior. Musculoskeletal: Mild lumbar spondylosis and degenerative disc disease without observed impingement. IMPRESSION: 1. Stable soft tissue nodularity along the omentum suspicious for peritoneal spread of  tumor. Stable appearance of nodularity along the uterine fundus and indistinctness along the right adnexa, some of this may be due to fibroids. 2. The previously borderline enlarged left external iliac lymph node is now normal in size. 3. Stable 4 mm ill-defined right upper lobe pulmonary nodule anteriorly, this may well in depth being benign but merits surveillance. 4. Cholelithiasis. Electronically Signed   By: Van Clines M.D.   On: 01/07/2017 16:01   Ct Abdomen Pelvis W Contrast  Result Date: 01/07/2017 CLINICAL DATA:  Endometrial/uterine cancer diagnosed in 2018. Completed chemotherapy and radiation therapy. Restaging assessment. EXAM: CT CHEST, ABDOMEN, AND PELVIS WITH CONTRAST TECHNIQUE: Multidetector CT imaging of the chest, abdomen and pelvis was performed following the standard protocol during bolus administration of intravenous contrast. CONTRAST:  100 cc Isovue 300 COMPARISON:  09/06/2016 and 11/08/2016 CT scans FINDINGS: CT CHEST FINDINGS Cardiovascular:  Right Port-A-Cath tip: SVC. Borderline cardiomegaly. Mediastinum/Nodes: Unremarkable Lungs/Pleura: Biapical pleuroparenchymal scarring slightly greater on the right than the left. Somewhat ill-defined 4 mm right upper lobe pulmonary nodule on image 49/6, stable. No pleural effusion. Musculoskeletal: Thoracic spondylosis. 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 CT ABDOMEN PELVIS FINDINGS Hepatobiliary: There are at least 3 rim calcified gallstones measuring up to 1.6 cm in diameter. No hepatic parenchymal lesion identified. Common bile duct measures approximately 6 mm in diameter, upper normal for age. Pancreas: Unremarkable Spleen: Unremarkable Adrenals/Urinary Tract: Unremarkable Stomach/Bowel: Unremarkable Vascular/Lymphatic: Left external iliac node 0.7 cm in short axis, previously 1.0 cm in short axis. Right inguinal lymph node 0.8 cm in short axis, previously 0.9 cm. Reproductive: Nonspecific masses along the fundus of the uterus, similar to the prior exam. Indistinct tissue planes along the right adnexa with the appendix tracking adjacent to the right adnexa. Other: Again observed is abnormal nodularity along the omentum suspicious for peritoneal spread of metastatic disease, no change from prior. Musculoskeletal: Mild lumbar spondylosis and degenerative disc disease without observed impingement. IMPRESSION: 1. Stable soft tissue nodularity along the omentum suspicious for peritoneal spread of tumor. Stable appearance of nodularity along the uterine fundus and indistinctness along the right adnexa, some of this may be due to fibroids. 2. The previously borderline enlarged left external iliac lymph node is now normal in size. 3. Stable 4 mm ill-defined right upper lobe pulmonary nodule anteriorly, this may well in depth being benign but merits surveillance. 4. Cholelithiasis. Electronically Signed   By: Van Clines M.D.   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   No orders of the defined types were placed in this encounter.  All questions were answered. The patient knows to call the clinic with any problems, questions or concerns. No barriers to learning was detected. I spent 15 minutes counseling the patient face to face. The total time spent in the appointment was 20 minutes and more than 50% was on counseling and review of test results     Heath Lark, MD 01/08/2017 2:16 PM

## 2017-01-08 NOTE — Assessment & Plan Note (Signed)
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

## 2017-01-08 NOTE — Assessment & Plan Note (Signed)
She tolerated chemotherapy well without major side effects CT imaging study from yesterday showed positive response to treatment I have discussed this briefly with GYN oncologist The case will be presented at the tumor board next week I will call her son to discuss plan of care after the tumor board evaluation

## 2017-01-13 ENCOUNTER — Encounter: Payer: Self-pay | Admitting: Gynecologic Oncology

## 2017-01-13 NOTE — Progress Notes (Signed)
Gynecologic Oncology Multi-Disciplinary Disposition Conference Note  Date of the Conference: January 13, 2017  Patient Name: Emily Duarte  Referring Provider: Dr. Sondra Come Primary GYN Oncologist: Dr. Everitt Amber  Stage/Disposition:  Stage IV poorly differentiated endometrial cancer.  Disposition is to consider hormonal therapy.   This Multidisciplinary conference took place involving physicians from [place], [place], Radiation Oncology, Pathology, Radiology along with the Gynecologic Oncology Nurse Practitioner and RN.  Comprehensive assessment of the patient's malignancy, staging, need for surgery, chemotherapy, radiation therapy, and need for further testing were reviewed. Supportive measures, both inpatient and following discharge were also discussed. The recommended plan of care is documented. Greater than 35 minutes were spent correlating and coordinating this patient's care.

## 2017-01-17 ENCOUNTER — Other Ambulatory Visit: Payer: Self-pay

## 2017-01-17 MED ORDER — HYDROCODONE-ACETAMINOPHEN 5-325 MG PO TABS: 1.0000 | ORAL_TABLET | Freq: Four times a day (QID) | ORAL | 0 refills | Status: DC | PRN

## 2017-01-20 ENCOUNTER — Encounter: Payer: Self-pay | Admitting: Gynecologic Oncology

## 2017-01-20 ENCOUNTER — Ambulatory Visit: Payer: Medicare Other | Attending: Gynecologic Oncology | Admitting: Gynecologic Oncology

## 2017-01-20 VITALS — BP 108/61 | HR 106 | Temp 98.5°F | Resp 18 | Ht 64.0 in | Wt 184.4 lb

## 2017-01-20 DIAGNOSIS — F200 Paranoid schizophrenia: Secondary | ICD-10-CM | POA: Diagnosis not present

## 2017-01-20 DIAGNOSIS — Z800 Family history of malignant neoplasm of digestive organs: Secondary | ICD-10-CM | POA: Insufficient documentation

## 2017-01-20 DIAGNOSIS — I10 Essential (primary) hypertension: Secondary | ICD-10-CM | POA: Diagnosis not present

## 2017-01-20 DIAGNOSIS — C541 Malignant neoplasm of endometrium: Secondary | ICD-10-CM | POA: Diagnosis present

## 2017-01-20 DIAGNOSIS — Z818 Family history of other mental and behavioral disorders: Secondary | ICD-10-CM | POA: Insufficient documentation

## 2017-01-20 DIAGNOSIS — C786 Secondary malignant neoplasm of retroperitoneum and peritoneum: Secondary | ICD-10-CM | POA: Diagnosis not present

## 2017-01-20 DIAGNOSIS — Z79899 Other long term (current) drug therapy: Secondary | ICD-10-CM | POA: Insufficient documentation

## 2017-01-20 NOTE — Progress Notes (Signed)
Consult Note: Gyn-Onc  Consult was requested by Dr. Alvy Bimler and Dr Sondra Come for the evaluation of [place] 66 y.o. female  CC:  Chief Complaint  Patient presents with  . Endometrial cancer [facility])    Assessment/Plan:  Ms. ABRIELLA FILKINS  is a 66 y.o.  year old with stage IV poorly differentiated endometrial cancer in the setting of paranoid schizophrenia. The patient is having a good partial response to 6 cycles of chemotherapy.  However, she still has a burden of disease in her peritoneal cavity and nodal basins, and on exam, the parametrium still feels involved by tumor.  I do not feel that there is a role for surgery or radiation at this time, as these interventions would be strictly palliative and she has no symptoms to palliate at this time.  I am recommending continuing systemic therapy of some sort - potentially with hormonal interventions.  We will have her see Dr Alvy Bimler again with a plan for hormonal therapy (eg possible megace alternating with tamoxifen) vs everolimus + letrozole.  I will be happy to see her back periodically for pelvic checks.   HPI: Jailynne Opperman is a 66 year old woman who is seen in consultation at the request of Dr Alvy Bimler and Sondra Come for stage IV endometrial cancer.  The patient was [facility] to [facility] in [place] in April, 2018 for vaginal bleeding. During that time examination was concerning for potential endometrial cancer. Due to her acute psychiatric issues (paranoid schizophrenia) she was transferred to [facility] [place] for inpatient psychiatric care. At that location she received an endometrial biopsy in April 22nd, 2018 which revealed high grade undifferentiated carcinoma with weak positivity of ER, negative for PR. The cervix was grossly involved and ECC was also positive for carcinoma. A consultation with Dr Tamera Stands was performed who felt that she had locally advanced disease with vaginal involvement and recommended  radiation and chemotherapy with subsequent surgery.   She was eventually discharged from hospital and presented to [facility] to receive care with Dr Sondra Come in May, 2018 but pre-treatment imaging revealed peritoneal carcinomatosis, and therefore radiation was cancelled and instead a plan was made for chemotherapy with carb/tax.  Pretreatment CA 125 was 1150 on 09/05/16.   Interval History: She went on to receive a total of 6 cycles of chemotherapy (carb/tax).  CA 125 on 12/30/16 (cycle 6) was 110.  Repeat CT scan on 01/07/17 showed: Stable soft tissue nodularity along the omentum suspicious for peritoneal spread of tumor. Stable appearance of nodularity along the uterine fundus and indistinctness along the right adnexa, some of this may be due to fibroids. The previously borderline enlarged left external iliac lymph node is now normal in size. Stable 4 mm ill-defined right upper lobe pulmonary nodule anteriorly, this may well in depth being benign but merits Ssrveillance.  Her case was discussed at multidisciplinary conference. Given the extent of peritoneal disease persistent, albeit improving, local therapies (surgery and radiation) were not felt to be of value. She would be considered for palliative RT if she begins bleeding.    Current Meds:  Outpatient Encounter Prescriptions as of 01/20/2017  Medication Sig  . dexamethasone (DECADRON) 4 MG tablet Take 10 tablets at 6 am on the day of chemo only, every 3 weeks  . divalproex (DEPAKOTE ER) 500 MG 24 hr tablet Take by mouth daily.   Marland Kitchen HYDROcodone-acetaminophen (NORCO/VICODIN) 5-325 MG tablet Take 1 tablet by mouth every 6 (six) hours as needed for moderate pain.  Marland Kitchen  ibuprofen (ADVIL,MOTRIN) 200 MG tablet Take 200 mg by mouth every 6 (six) hours as needed.  . lidocaine-prilocaine (EMLA) cream Apply to affected area once  . omega-3 acid ethyl esters (LOVAZA) 1 g capsule Take by mouth.  . ondansetron (ZOFRAN) 8 MG tablet Take 1 tablet  (8 mg total) by mouth 2 (two) times daily as needed for refractory nausea / vomiting. Start on day 3 after chemo.  . prochlorperazine (COMPAZINE) 10 MG tablet Take 1 tablet (10 mg total) by mouth every 6 (six) hours as needed (Nausea or vomiting).   No facility-administered encounter medications on file as of 01/20/2017.     Allergy: No Known Allergies  Social Hx:   Social History   Social History  . Marital status: Married    Spouse name: N/A  . Number of children: 1  . Years of education: N/A   Occupational History  . Retired    Social History Main Topics  . Smoking status: Never Smoker  . Smokeless tobacco: Never Used  . Alcohol use No  . Drug use: No  . Sexual activity: No   Other Topics Concern  . Not on file   Social History Narrative  . No narrative on file    Past Surgical Hx:  Past Surgical History:  Procedure Laterality Date  . ENDOMETRIAL BIOPSY  08/11/2016  . IR FLUORO GUIDE PORT INSERTION RIGHT  09/05/2016  . IR US GUIDE VASC ACCESS RIGHT  09/05/2016  . TUBAL LIGATION      Past Medical Hx:  Past Medical History:  Diagnosis Date  . Endometrial cancer ([place])   . Hemorrhoid   . Hypertension   . Paranoid schizophrenia ([place])   . Snake bite     Past Gynecological History:  parous No LMP recorded. Patient is postmenopausal.  Family Hx:  Family History  Problem Relation Age of Onset  . Colon cancer Mother        late 59s  . Schizophrenia Sister   . Colon cancer Sister        late 20 s    Review of Systems:  Constitutional  Feels well,    ENT Normal appearing ears and nares bilaterally Skin/Breast  No rash, sores, jaundice, itching, dryness Cardiovascular  No chest pain, shortness of breath, or edema  Pulmonary  No cough or wheeze.  Gastro Intestinal  No nausea, vomitting, or diarrhoea. No bright red blood per rectum, no abdominal pain, change in bowel movement, or constipation.  Genito Urinary  No frequency, urgency, dysuria, no  bleeding Musculo Skeletal  No myalgia, arthralgia, joint swelling or pain  Neurologic  No weakness, numbness, change in gait,  Psychology  No depression, anxiety, insomnia.   Vitals:  Blood pressure 108/61, pulse (!) 106, temperature 98.5 F (36.9 C), temperature source Oral, resp. rate 18, height 5\' 4"  (1.626 m), weight 184 lb 6.4 oz (83.6 kg), SpO2 97 %.  Physical Exam: WD in NAD Neck  Supple NROM, without any enlargements.  Lymph Node Survey No cervical supraclavicular or inguinal adenopathy Cardiovascular  Pulse normal rate, regularity and rhythm. S1 and S2 normal.  Lungs  Clear to auscultation bilateraly, without wheezes/crackles/rhonchi. Good air movement.  Skin  No rash/lesions/breakdown  Psychiatry  Alert and oriented to person, place, and time  Abdomen  Normoactive bowel sounds, abdomen soft, non-tender and obese without evidence of hernia. No palpable masses or distension. Back No CVA tenderness Genito Urinary  Vulva/vagina: Normal external female genitalia.  No lesions. No discharge or bleeding.  Bladder/urethra:  No lesions or masses, well supported bladder  Vagina: grossly normal  Cervix: Normal appearing, no lesions but bulky and firm (consistent with stromal tumor involvement)  Uterus: Bulky, mobile, + left parametrial involvement/induration appreciated  Adnexa: no discrete masses. Rectal  Good tone, no masses no cul de sac nodularity.  Extremities  No bilateral cyanosis, clubbing or edema.   Donaciano Eva, MD  01/20/2017, 12:32 PM

## 2017-01-20 NOTE — Patient Instructions (Addendum)
Plan to follow up with Dr. Alvy Bimler to discuss starting on hormone therapy.  We will call with you with an appointment to meet with Dr. Alvy Bimler.  Please call for any concerns.

## 2017-01-22 ENCOUNTER — Telehealth: Payer: Self-pay | Admitting: Hematology and Oncology

## 2017-01-22 ENCOUNTER — Other Ambulatory Visit: Payer: Self-pay | Admitting: Hematology and Oncology

## 2017-01-22 MED ORDER — MEGESTROL ACETATE 40 MG PO TABS: ORAL_TABLET | ORAL | 11 refills | Status: DC

## 2017-01-22 MED ORDER — TAMOXIFEN CITRATE 20 MG PO TABS: ORAL_TABLET | ORAL | 11 refills | Status: DC

## 2017-01-22 NOTE — Telephone Encounter (Signed)
I have reviewed recommendations with her son We discussed about maintenance treatment with Megace alternate with tamoxifen I will schedule return appointment on October 22 for toxicity review The son is given instruction about side effects of tamoxifen versus Megace and how to take the medications in alternate fashion and not at the same time

## 2017-01-22 NOTE — Telephone Encounter (Signed)
Scheduled appt per 10/3 sch msg. Spoke with husband regarding her appts.

## 2017-02-03 ENCOUNTER — Other Ambulatory Visit: Payer: Self-pay | Admitting: *Deleted

## 2017-02-03 MED ORDER — HYDROCODONE-ACETAMINOPHEN 5-325 MG PO TABS: 1.0000 | ORAL_TABLET | Freq: Four times a day (QID) | ORAL | 0 refills | Status: DC | PRN

## 2017-02-04 ENCOUNTER — Telehealth: Payer: Self-pay | Admitting: *Deleted

## 2017-02-04 ENCOUNTER — Encounter (HOSPITAL_COMMUNITY): Payer: Self-pay | Admitting: Emergency Medicine

## 2017-02-04 ENCOUNTER — Emergency Department (HOSPITAL_COMMUNITY): Payer: Medicare Other

## 2017-02-04 ENCOUNTER — Emergency Department (HOSPITAL_COMMUNITY)
Admission: EM | Admit: 2017-02-04 | Discharge: 2017-02-05 | Disposition: A | Discharge status: Other Acute Inpatient Hospital | Payer: Medicare Other | Attending: Emergency Medicine | Admitting: Emergency Medicine

## 2017-02-04 DIAGNOSIS — C541 Malignant neoplasm of endometrium: Secondary | ICD-10-CM | POA: Insufficient documentation

## 2017-02-04 DIAGNOSIS — N3000 Acute cystitis without hematuria: Secondary | ICD-10-CM

## 2017-02-04 DIAGNOSIS — Z79899 Other long term (current) drug therapy: Secondary | ICD-10-CM | POA: Insufficient documentation

## 2017-02-04 DIAGNOSIS — I10 Essential (primary) hypertension: Secondary | ICD-10-CM | POA: Insufficient documentation

## 2017-02-04 DIAGNOSIS — N938 Other specified abnormal uterine and vaginal bleeding: Secondary | ICD-10-CM | POA: Insufficient documentation

## 2017-02-04 DIAGNOSIS — Z008 Encounter for other general examination: Secondary | ICD-10-CM

## 2017-02-04 DIAGNOSIS — F23 Brief psychotic disorder: Secondary | ICD-10-CM | POA: Diagnosis not present

## 2017-02-04 DIAGNOSIS — F251 Schizoaffective disorder, depressive type: Secondary | ICD-10-CM

## 2017-02-04 LAB — COMPREHENSIVE METABOLIC PANEL
ALK PHOS: 100 U/L (ref 38–126)
ALT: 29 U/L (ref 14–54)
AST: 41 U/L (ref 15–41)
Albumin: 3.4 g/dL — ABNORMAL LOW (ref 3.5–5.0)
Anion gap: 12 (ref 5–15)
BUN: 9 mg/dL (ref 6–20)
CALCIUM: 8.9 mg/dL (ref 8.9–10.3)
CHLORIDE: 102 mmol/L (ref 101–111)
CO2: 24 mmol/L (ref 22–32)
CREATININE: 0.75 mg/dL (ref 0.44–1.00)
GFR calc Af Amer: >60 mL/min (ref >60)
Glucose, Bld: 115 mg/dL — ABNORMAL HIGH (ref 65–99)
Potassium: 3.2 mmol/L — ABNORMAL LOW (ref 3.5–5.1)
Sodium: 138 mmol/L (ref 135–145)
Total Bilirubin: 0.8 mg/dL (ref 0.3–1.2)
Total Protein: 8 g/dL (ref 6.5–8.1)

## 2017-02-04 LAB — URINALYSIS, COMPLETE (UACMP) WITH MICROSCOPIC
BILIRUBIN URINE: NEGATIVE
Glucose, UA: NEGATIVE mg/dL
Ketones, ur: NEGATIVE mg/dL
Nitrite: NEGATIVE
PH: 5 (ref 5.0–8.0)
Protein, ur: 30 mg/dL — AB
SPECIFIC GRAVITY, URINE: 1.016 (ref 1.005–1.030)

## 2017-02-04 LAB — CBC WITH DIFFERENTIAL/PLATELET
BASOS PCT: 0 %
Basophils Absolute: 0 10*3/uL (ref 0.0–0.1)
EOS ABS: 0.1 10*3/uL (ref 0.0–0.7)
Eosinophils Relative: 1 %
HCT: 30 % — ABNORMAL LOW (ref 36.0–46.0)
HEMOGLOBIN: 9.7 g/dL — AB (ref 12.0–15.0)
LYMPHS ABS: 1.5 10*3/uL (ref 0.7–4.0)
Lymphocytes Relative: 21 %
MCH: 29.9 pg (ref 26.0–34.0)
MCHC: 32.3 g/dL (ref 30.0–36.0)
MCV: 92.6 fL (ref 78.0–100.0)
Monocytes Absolute: 0.6 10*3/uL (ref 0.1–1.0)
Monocytes Relative: 8 %
NEUTROS PCT: 70 %
Neutro Abs: 4.9 10*3/uL (ref 1.7–7.7)
PLATELETS: 380 10*3/uL (ref 150–400)
RBC: 3.24 MIL/uL — AB (ref 3.87–5.11)
RDW: 16.2 % — AB (ref 11.5–15.5)
WBC: 7.1 10*3/uL (ref 4.0–10.5)

## 2017-02-04 LAB — RAPID URINE DRUG SCREEN, HOSP PERFORMED
AMPHETAMINES: NOT DETECTED
Barbiturates: NOT DETECTED
Benzodiazepines: NOT DETECTED
Cocaine: NOT DETECTED
Opiates: NOT DETECTED
TETRAHYDROCANNABINOL: NOT DETECTED

## 2017-02-04 LAB — ETHANOL

## 2017-02-04 MED ORDER — IBUPROFEN 200 MG PO TABS: 600.0000 mg | ORAL_TABLET | Freq: Four times a day (QID) | ORAL | Status: DC | PRN

## 2017-02-04 MED ORDER — TAMOXIFEN CITRATE 10 MG PO TABS
20.0000 mg | ORAL_TABLET | Freq: Every day | ORAL | Status: DC
  Administered 2017-02-04 – 2017-02-05 (×2): 20 mg via ORAL
  Filled 2017-02-04 (×2): qty 2

## 2017-02-04 MED ORDER — ACETAMINOPHEN 325 MG PO TABS
650.0000 mg | ORAL_TABLET | ORAL | Status: DC | PRN
  Administered 2017-02-04: 650 mg via ORAL
  Filled 2017-02-04: qty 2

## 2017-02-04 MED ORDER — CEPHALEXIN 500 MG PO CAPS
500.0000 mg | ORAL_CAPSULE | Freq: Two times a day (BID) | ORAL | Status: DC
  Administered 2017-02-04 – 2017-02-05 (×3): 500 mg via ORAL
  Filled 2017-02-04 (×3): qty 1

## 2017-02-04 MED ORDER — POTASSIUM CHLORIDE CRYS ER 20 MEQ PO TBCR
40.0000 meq | EXTENDED_RELEASE_TABLET | Freq: Once | ORAL | Status: AC
  Administered 2017-02-04: 40 meq via ORAL
  Filled 2017-02-04: qty 2

## 2017-02-04 NOTE — BH Assessment (Addendum)
Assessment Note  Emily Duarte is an 66 y.o. female with history of Schizophrenia. She presents to [facility] with the complaint of not having the will to get up and do anything. She usually gets up and scrubs the floors and blinds. She also reports cleaning he soap dishes, putting on nail polish, and moo moos as her daily routine. She additionally sts that she likes to put on make up but hasn't had the desire to do so. Patient has felt this way for 6 days. She does admit to some depressive symptoms including loss of interest in usual pleasures and fatigue. She denies SI. No history of harm to self including self mutilating behaviors. No family history of mental health illness. No HI. No history of aggressive or assaultive behaviors. Patient is however appears bizarre with flight of ideas. Write is unclear if this is patient's baseline. Speech is normal but pressured. Insight and judgement are both fair. Mood is appropriate. She is casually dressed. Patient does not have a psychiatrist or therapist. Sts that she would like Emily Kenning, MD at [facility] to be her doctor but she doesn't have transportation to the facility.   Patient approved for this writer to contact her son for collateral information. Writer contacted her son Emily Duarte) # [phone]. Sts that patient's mental status started to decline in 1995. The son is concerned about patient's lack of self care. He is most concerned that his mother is not taking her medications. Patient is a cancer patient. Sts, "She is prescribed hormone therapy and she is not taking it.Marland KitchenMarland KitchenThis is life or death". The son sts that she will take the medications if it's given to her. Otherwise she will not take medications. The son further sts that she will not eat food unless it's given to her. She typically cooks and has groceries but has refused to prepare. She is sleeping excessively. The son has never heard patient make suicidal statements.  However, sts that she has stepped out in traffic in the past because she was unaware of what she was doing. The son sts that patient has experienced similar declines.... He sts, "These declines happen 1-2x's per year". The son would like patient to live with him but patient declines because she wants to be independent. He would like patient to receive INPT services at [facility]. Sts that she has received INPT services at the facility in the past and did very well.   Diagnosis: Paranoid Schizophrenia  Past Medical History:  Past Medical History:  Diagnosis Date  . Endometrial cancer ([place])   . Hemorrhoid   . Hypertension   . Paranoid schizophrenia ([place])   . Snake bite     Past Surgical History:  Procedure Laterality Date  . ENDOMETRIAL BIOPSY  08/11/2016  . IR FLUORO GUIDE PORT INSERTION RIGHT  09/05/2016  . IR US GUIDE VASC ACCESS RIGHT  09/05/2016  . TUBAL LIGATION      Family History:  Family History  Problem Relation Age of Onset  . Colon cancer Mother        late 52s  . Schizophrenia Sister   . Colon cancer Sister        late 56 s    Social History:  reports that she has never smoked. She has never used smokeless tobacco. She reports that she does not drink alcohol or use drugs.  Additional Social History:  Alcohol / Drug Use Pain Medications: see MAR Prescriptions: see MAR Over the Counter: see MAR  History of alcohol / drug use?: Yes Longest period of sobriety (when/how long): unknown  CIWA: CIWA-Ar BP: 121/69 Pulse Rate: 94 COWS:    Allergies: No Known Allergies  Home Medications:  (Not in a hospital admission)  OB/GYN Status:  No LMP recorded. Patient is postmenopausal.  General Assessment Data TTS Assessment: In system Is this a Tele or Face-to-Face Assessment?: Face-to-Face Is this an Initial Assessment or a Re-assessment for this encounter?: Initial Assessment Marital status: Divorced Elwin Sleight name:  Emily Duarte) Is patient pregnant?: No Pregnancy  Status: No Living Arrangements: Alone Can pt return to current living arrangement?: Yes Admission Status: Voluntary Is patient capable of signing voluntary admission?: Yes Referral Source: Self/Family/Friend Insurance type:  (Medicare )     Crisis Care Plan Living Arrangements: Alone Legal Guardian: Other: (no legal guardian ) Name of Psychiatrist:  (no psychiatrist ) Name of Therapist:  (no therapist )  Education Status Is patient currently in school?: No Current Grade:  (n/a) Highest grade of school patient has completed:  (n/a) Name of school:  (n/a) Contact person:  (n/a)  Risk to self with the past 6 months Suicidal Ideation: No Has patient been a risk to self within the past 6 months prior to admission? : No Suicidal Intent: No Has patient had any suicidal intent within the past 6 months prior to admission? : No Is patient at risk for suicide?: No Suicidal Plan?: No Has patient had any suicidal plan within the past 6 months prior to admission? : No Access to Means: No What has been your use of drugs/alcohol within the last 12 months?:  ("I really like [place]") Previous Attempts/Gestures: No How many times?:  (0) Other Self Harm Risks:  (denies self harm risks) Triggers for Past Attempts: Other (Comment) (no previous attempts and/or gestures ) Intentional Self Injurious Behavior: None Family Suicide History: No Recent stressful life event(s): Other (Comment) ("I just finished chemo therapy"; "loud noise from a truck") Persecutory voices/beliefs?: No Depression: Yes Depression Symptoms: Feeling worthless/self pity, Feeling angry/irritable, Loss of interest in usual pleasures, Fatigue, Isolating Substance abuse history and/or treatment for substance abuse?: No Suicide prevention information given to non-admitted patients: Not applicable  Risk to Others within the past 6 months Homicidal Ideation: No Does patient have any lifetime risk of violence toward  others beyond the six months prior to admission? : No Thoughts of Harm to Others: No Current Homicidal Intent: No Current Homicidal Plan: No Access to Homicidal Means: No Identified Victim:  (n/qa) History of harm to others?: No Assessment of Violence: None Noted Violent Behavior Description:  (patient is calm and cooperative) Does patient have access to weapons?: No Criminal Charges Pending?: No Does patient have a court date: No Is patient on probation?: No  Psychosis Hallucinations: None noted Delusions: None noted  Mental Status Report Appearance/Hygiene: Disheveled Eye Contact: Good Motor Activity: Freedom of movement Speech: Rapid Level of Consciousness: Alert Mood: Depressed Affect: Appropriate to circumstance Anxiety Level: None Thought Processes: Flight of Ideas, Relevant, Coherent Judgement: Impaired Orientation: Person, Time, Situation, Place Obsessive Compulsive Thoughts/Behaviors: None  Cognitive Functioning Concentration: Decreased Memory: Recent Intact, Remote Intact IQ: Average Insight: Fair Impulse Control: Fair Appetite: Poor Weight Loss:  (none reported) Weight Gain:  (none reported) Sleep: No Change Total Hours of Sleep:  ("I get alot of sleep"..."7hrs") Vegetative Symptoms: None  ADLScreening [facility]) Patient's cognitive ability adequate to safely complete daily activities?: Yes Patient able to express need for assistance with ADLs?: Yes Independently performs ADLs?:  No  Prior Inpatient Therapy Prior Inpatient Therapy: Yes Prior Therapy Dates:  (2 years ago) Prior Therapy Facilty/Provider(s):  [facility]) Reason for Treatment:  (Schizophrenia; "I was bit by a snake")  Prior Outpatient Therapy Prior Outpatient Therapy: No Prior Therapy Dates:  (n/a) Prior Therapy Facilty/Provider(s):  (n/a) Reason for Treatment:  (n/a) Does patient have an ACCT team?: No Does patient have Intensive In-House Services?   : No Does patient have [facility] services? : No Does patient have P4CC services?: No  ADL Screening (condition at time of admission) Patient's cognitive ability adequate to safely complete daily activities?: Yes Is the patient deaf or have difficulty hearing?: No Does the patient have difficulty seeing, even when wearing glasses/contacts?: No Does the patient have difficulty concentrating, remembering, or making decisions?: No Patient able to express need for assistance with ADLs?: Yes Does the patient have difficulty dressing or bathing?: No Independently performs ADLs?: No Communication: Independent Dressing (OT): Independent Grooming: Independent Feeding: Independent Bathing: Independent Toileting: Independent In/Out Bed: Independent Walks in Home: Independent Does the patient have difficulty walking or climbing stairs?: No Weakness of Legs: None Weakness of Arms/Hands: None  Home Assistive Devices/Equipment Home Assistive Devices/Equipment: None    Abuse/Neglect Assessment (Assessment to be complete while patient is alone) Physical Abuse: Denies Verbal Abuse: Denies Sexual Abuse: Denies Exploitation of patient/patient's resources: Denies Self-Neglect: Denies     Regulatory affairs officer (For Healthcare) Does Patient Have a Medical Advance Directive?: No Would patient like information on creating a medical advance directive?: No - Patient declined Nutrition Screen- [facility] Adult/WL/AP Patient's home diet: Regular  Additional Information 1:1 In Past 12 Months?: No CIRT Risk: No Elopement Risk: No Does patient have medical clearance?: Yes     Disposition: Per Jinny Blossom, NP, patient meets criteria for INPT [facility] treatment.  Disposition Initial Assessment Completed for this Encounter: Yes  On Site Evaluation by:   Reviewed with Physician:    Waldon Merl 02/04/2017 1:34 PM

## 2017-02-04 NOTE — ED Notes (Signed)
[place] [phone]

## 2017-02-04 NOTE — BH Assessment (Signed)
[facility] Assessment Progress Note  Per Ethelene Hal, FNP, this pt requires psychiatric hospitalization at this time.  Pt is currently under voluntary status.  The following facilities have been contacted to seek placement for this pt, with results as noted:  Beds available, information sent, decision pending:  [facility]   At capacity:  [facility] [place], [place] Triage Specialist [phone]

## 2017-02-04 NOTE — Telephone Encounter (Signed)
Son left a message stating Emily Duarte is going to ED. "Has been bleeding since she saw Dr Denman George on 9/30"  Dr Alvy Bimler and Joylene John, NP notified

## 2017-02-04 NOTE — ED Provider Notes (Signed)
[place] DEPT Provider Note   CSN: [id number] Arrival date & time: 02/04/17  1016     History   Chief Complaint Chief Complaint  Patient presents with  . possible depression    HPI Emily Duarte is a 66 y.o. female.  Patient is a 66 year old female with history of endometrial cancer who is currently being treated with hormone therapy, and schizophrenia who presents with bizarre behavior. Per report and per the patient's report she states that she doesn't have the will to do anything. She does state that she's been eating okay per the family is concerned that she hasn't been taking her medications. She denies any hallucinations. No depression or suicidal ideations. I asked her if she's having any hallucinations and she reports no but she does say that she's been putting bleach in her vagina which is causing intermittent bleeding. Then she states that she went to the drugstore and washed it out and the bleeding stopped.      Past Medical History:  Diagnosis Date  . Endometrial cancer ([place])   . Hemorrhoid   . Hypertension   . Paranoid schizophrenia ([place])   . Snake bite     Patient Active Problem List   Diagnosis Date Noted  . 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 Elevated liver enzymes 09/06/2016  . Anemia, chronic disease 09/06/2016  . 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 Bipolar 1 disorder, mixed, severe ([place]) 08/07/2012  . Cyst of soft tissue 08/07/2012    Past Surgical History:  Procedure Laterality Date  . ENDOMETRIAL BIOPSY  08/11/2016  . IR FLUORO GUIDE PORT INSERTION RIGHT  09/05/2016  . IR US GUIDE VASC ACCESS RIGHT  09/05/2016  . TUBAL LIGATION      OB History    Gravida Para Term Preterm AB Living   1 1       1    SAB TAB Ectopic Multiple Live Births           1       Home Medications    Prior to Admission medications   Medication Sig Start Date End Date Taking?  Authorizing Provider  dexamethasone (DECADRON) 4 MG tablet Take 10 tablets at 6 am on the day of chemo only, every 3 weeks 09/06/16  Yes Alvy Bimler, Ni, MD  lidocaine-prilocaine (EMLA) cream Apply to affected area once 09/06/16  Yes Alvy Bimler, Ni, MD  megestrol (MEGACE) 40 MG tablet Take 2 tabs twice a day for 3 weeks, then rest 3 weeks (to alternate with prescription of taxomifen) 01/22/17  Yes Gorsuch, Ni, MD  Omega-3 Fatty Acids (FISH OIL) 1000 MG CAPS Take 100 mg by mouth daily.   Yes [provider]  ondansetron (ZOFRAN) 8 MG tablet Take 1 tablet (8 mg total) by mouth 2 (two) times daily as needed for refractory nausea / vomiting. Start on day 3 after chemo. 09/06/16  Yes Heath Lark, MD  prochlorperazine (COMPAZINE) 10 MG tablet Take 1 tablet (10 mg total) by mouth every 6 (six) hours as needed (Nausea or vomiting). 09/06/16  Yes Heath Lark, MD  tamoxifen (NOLVADEX) 20 MG tablet Take 1 tab twice a day for 3 weeks then rest (to alternate with prescription of Megace) 01/22/17  Yes Heath Lark, MD  HYDROcodone-acetaminophen (NORCO/VICODIN) 5-325 MG tablet Take 1 tablet by mouth every 6 (six) hours as needed for moderate pain. 02/03/17   Heath Lark, MD    Family History Family History  Problem Relation Age of Onset  .  Colon cancer Mother        late 35s  . Schizophrenia Sister   . Colon cancer Sister        late 82 s    Social History Social History  Substance Use Topics  . Smoking status: Never Smoker  . Smokeless tobacco: Never Used  . Alcohol use No     Allergies   Patient has no known allergies.   Review of Systems Review of Systems  Constitutional: Negative for chills, diaphoresis, fatigue and fever.  HENT: Negative for congestion, rhinorrhea and sneezing.   Eyes: Negative.   Respiratory: Negative for cough, chest tightness and shortness of breath.   Cardiovascular: Negative for chest pain and leg swelling.  Gastrointestinal: Negative for abdominal pain, blood in  stool, diarrhea, nausea and vomiting.  Genitourinary: Positive for vaginal bleeding. Negative for difficulty urinating, flank pain, frequency and hematuria.  Musculoskeletal: Negative for arthralgias and back pain.  Skin: Negative for rash.  Neurological: Negative for dizziness, speech difficulty, weakness, numbness and headaches.     Physical Exam Updated Vital Signs BP 121/69 (BP Location: Left Arm)   Pulse 94   Temp 99.5 F (37.5 C) (Oral)   Resp 18   SpO2 97%   Physical Exam  Constitutional: She is oriented to person, place, and time. She appears well-developed and well-nourished.  HENT:  Head: Normocephalic and atraumatic.  Eyes: Pupils are equal, round, and reactive to light.  Neck: Normal range of motion. Neck supple.  Cardiovascular: Normal rate, regular rhythm and normal heart sounds.   Pulmonary/Chest: Effort normal and breath sounds normal. No respiratory distress. She has no wheezes. She has no rales. She exhibits no tenderness.  Abdominal: Soft. Bowel sounds are normal. There is no tenderness. There is no rebound and no guarding.  Genitourinary:  Genitourinary Comments: External vaginal area appears to be normal without signs of bleeding. No burns or wounds are noted.  Musculoskeletal: Normal range of motion. She exhibits no edema.  Lymphadenopathy:    She has no cervical adenopathy.  Neurological: She is alert and oriented to person, place, and time.  Skin: Skin is warm and dry. No rash noted.  Psychiatric: She has a normal mood and affect.     ED Treatments / Results  Labs (all labs ordered are listed, but only abnormal results are displayed) Labs Reviewed  COMPREHENSIVE METABOLIC PANEL - Abnormal; Notable for the following:       Result Value   Potassium 3.2 (*)    Glucose, Bld 115 (*)    Albumin 3.4 (*)    All other components within normal limits  CBC WITH DIFFERENTIAL/PLATELET - Abnormal; Notable for the following:    RBC 3.24 (*)    Hemoglobin 9.7  (*)    HCT 30.0 (*)    RDW 16.2 (*)    All other components within normal limits  URINALYSIS, COMPLETE (UACMP) WITH MICROSCOPIC - Abnormal; Notable for the following:    Color, Urine AMBER (*)    APPearance HAZY (*)    Hgb urine dipstick SMALL (*)    Protein, ur 30 (*)    Leukocytes, UA MODERATE (*)    Bacteria, UA FEW (*)    Squamous Epithelial / LPF 0-5 (*)    All other components within normal limits  URINE CULTURE  ETHANOL  RAPID URINE DRUG SCREEN, HOSP PERFORMED    EKG  EKG Interpretation None       Radiology Dg Chest 1 View  Result Date: 02/04/2017 CLINICAL  DATA:  For psychiatric evaluation EXAM: CHEST 1 VIEW COMPARISON:  CT chest of 01/07/2017 and chest x-ray of 08/04/2016 FINDINGS: No active infiltrate or effusion is seen. Mediastinal and hilar contours are unremarkable. The heart is mildly enlarged. A right-sided Port-A-Cath is present with the tip overlying the lower SVC. No bony abnormality is seen. IMPRESSION: No active lung disease.  Port-A-Cath tip overlies the lower SVC. Electronically Signed   By: Ivar Drape M.D.   On: 02/04/2017 15:09    Procedures Procedures (including critical care time)  Medications Ordered in ED Medications  cephALEXin (KEFLEX) capsule 500 mg (500 mg Oral Given 02/04/17 1244)  tamoxifen (NOLVADEX) tablet 20 mg (not administered)  potassium chloride SA (K-DUR,KLOR-CON) CR tablet 40 mEq (40 mEq Oral Given 02/04/17 1301)     Initial Impression / Assessment and Plan / ED Course  I have reviewed the triage vital signs and the nursing notes.  Pertinent labs & imaging results that were available during my care of the patient were reviewed by me and considered in my medical decision making (see chart for details).     Patient is medically cleared and awaiting TTS evaluation. She has slightly low potassium and was given potassium replacement. She also has evidence of a urinary tract infection and was started on Keflex. Her urine was sent  for culture.  Final Clinical Impressions(s) / ED Diagnoses   Final diagnoses:  Acute cystitis without hematuria  Brief psychotic disorder [facility])    New Prescriptions New Prescriptions   No medications on file     Malvin Johns, MD 02/04/17 1542

## 2017-02-04 NOTE — ED Triage Notes (Signed)
Per pt, states she doesn't have the "will" to get up and do anything-states she usually gets up and scrubs here floor and blinds and she has'nt wanted to in 6 days-family states she is a cancer patient and she hasn't been taking her meds-son is worried about her-not sure if it is a mental or physical issue-denies SI/HI, denies being depressed

## 2017-02-04 NOTE — ED Notes (Signed)
Pt stated "I've finished my chemo.  I was watching TV and saw this cleaning lady how she cleaned blinds.  I was going to do that but I just couldn't get it started."  Pt denies SI/HI, A/V hallucinations.

## 2017-02-05 ENCOUNTER — Telehealth: Payer: Self-pay | Admitting: Hematology and Oncology

## 2017-02-05 DIAGNOSIS — F251 Schizoaffective disorder, depressive type: Secondary | ICD-10-CM

## 2017-02-05 LAB — URINE CULTURE

## 2017-02-05 NOTE — ED Notes (Signed)
Patient discharged to [place] via [facility] transport.

## 2017-02-05 NOTE — BH Assessment (Signed)
[facility] [place] Assessment Progress Note   Clinician received call from [place] at [facility].  She said that patient had been accepted there by Dr. Reece Levy.  Patient can go to [facility] anytime after 09:00.  Nurse call report to [phone].

## 2017-02-05 NOTE — ED Notes (Signed)
Attempted to call report to [place] nurse unable to received report at this time asked to call back in 15 minutes

## 2017-02-05 NOTE — Telephone Encounter (Signed)
Spoke to patients son regarding upcoming appointment updates per 10/15 sch message.

## 2017-02-05 NOTE — BH Assessment (Signed)
[facility] Assessment Progress Note  This Probation officer spoke to pt, who is currently under voluntary status, about potential transfer to [facility].  She agrees to plan.  Pt's nurse has been notified that Dr Reece Levy is the accepting physician, that report is to be called to [phone], and that pt is to be transported via [place], FNP, will be asked to enter EMTALA.  Jalene Mullet, [place] Triage Specialist [phone]

## 2017-02-10 ENCOUNTER — Other Ambulatory Visit: Payer: Self-pay

## 2017-02-10 ENCOUNTER — Ambulatory Visit: Payer: Self-pay | Admitting: Hematology and Oncology

## 2017-02-13 ENCOUNTER — Ambulatory Visit (HOSPITAL_COMMUNITY): Payer: Self-pay

## 2017-02-17 ENCOUNTER — Ambulatory Visit (HOSPITAL_BASED_OUTPATIENT_CLINIC_OR_DEPARTMENT_OTHER): Payer: Medicare Other

## 2017-02-17 ENCOUNTER — Other Ambulatory Visit (HOSPITAL_COMMUNITY): Payer: Medicare Other | Attending: Psychiatry

## 2017-02-17 DIAGNOSIS — Z452 Encounter for adjustment and management of vascular access device: Secondary | ICD-10-CM

## 2017-02-17 DIAGNOSIS — C541 Malignant neoplasm of endometrium: Secondary | ICD-10-CM | POA: Diagnosis not present

## 2017-02-17 MED ORDER — HEPARIN SOD (PORK) LOCK FLUSH 100 UNIT/ML IV SOLN
500.0000 [IU] | Freq: Once | INTRAVENOUS | Status: AC
  Administered 2017-02-17: 500 [IU]
  Filled 2017-02-17: qty 5

## 2017-02-17 MED ORDER — SODIUM CHLORIDE 0.9% FLUSH
10.0000 mL | Freq: Once | INTRAVENOUS | Status: AC
  Administered 2017-02-17: 10 mL
  Filled 2017-02-17: qty 10

## 2017-02-18 ENCOUNTER — Telehealth (HOSPITAL_COMMUNITY): Payer: Self-pay | Admitting: Professional

## 2017-02-18 ENCOUNTER — Telehealth: Payer: Self-pay | Admitting: *Deleted

## 2017-02-18 MED ORDER — HYDROCODONE-ACETAMINOPHEN 5-325 MG PO TABS: 1.0000 | ORAL_TABLET | Freq: Four times a day (QID) | ORAL | 0 refills | Status: DC | PRN

## 2017-02-18 NOTE — Telephone Encounter (Signed)
Son states Emily Duarte needs a refill of hydrocodone. Is taking 2-3 per day

## 2017-02-19 ENCOUNTER — Other Ambulatory Visit (HOSPITAL_COMMUNITY): Payer: Medicare Other

## 2017-02-24 ENCOUNTER — Ambulatory Visit (HOSPITAL_COMMUNITY): Payer: Medicare Other

## 2017-03-03 ENCOUNTER — Telehealth: Payer: Self-pay | Admitting: Hematology and Oncology

## 2017-03-03 ENCOUNTER — Encounter: Payer: Self-pay | Admitting: Hematology and Oncology

## 2017-03-03 ENCOUNTER — Ambulatory Visit (HOSPITAL_BASED_OUTPATIENT_CLINIC_OR_DEPARTMENT_OTHER): Payer: Medicare Other | Admitting: Hematology and Oncology

## 2017-03-03 ENCOUNTER — Other Ambulatory Visit (HOSPITAL_BASED_OUTPATIENT_CLINIC_OR_DEPARTMENT_OTHER): Payer: Medicare Other

## 2017-03-03 VITALS — BP 124/73 | HR 99 | Temp 98.4°F | Resp 18 | Ht 64.0 in | Wt 176.2 lb

## 2017-03-03 DIAGNOSIS — N939 Abnormal uterine and vaginal bleeding, unspecified: Secondary | ICD-10-CM

## 2017-03-03 DIAGNOSIS — Z79810 Long term (current) use of selective estrogen receptor modulators (SERMs): Secondary | ICD-10-CM | POA: Diagnosis not present

## 2017-03-03 DIAGNOSIS — E440 Moderate protein-calorie malnutrition: Secondary | ICD-10-CM | POA: Insufficient documentation

## 2017-03-03 DIAGNOSIS — Z9221 Personal history of antineoplastic chemotherapy: Secondary | ICD-10-CM

## 2017-03-03 DIAGNOSIS — D638 Anemia in other chronic diseases classified elsewhere: Secondary | ICD-10-CM | POA: Diagnosis not present

## 2017-03-03 DIAGNOSIS — R634 Abnormal weight loss: Secondary | ICD-10-CM

## 2017-03-03 DIAGNOSIS — F3163 Bipolar disorder, current episode mixed, severe, without psychotic features: Secondary | ICD-10-CM | POA: Diagnosis not present

## 2017-03-03 DIAGNOSIS — R748 Abnormal levels of other serum enzymes: Secondary | ICD-10-CM | POA: Diagnosis not present

## 2017-03-03 DIAGNOSIS — C541 Malignant neoplasm of endometrium: Secondary | ICD-10-CM

## 2017-03-03 LAB — CBC WITH DIFFERENTIAL/PLATELET
BASO%: 0.1 % (ref 0.0–2.0)
Basophils Absolute: 0 10*3/uL (ref 0.0–0.1)
EOS ABS: 0.1 10*3/uL (ref 0.0–0.5)
EOS%: 0.8 % (ref 0.0–7.0)
HCT: 32.1 % — ABNORMAL LOW (ref 34.8–46.6)
HGB: 10.2 g/dL — ABNORMAL LOW (ref 11.6–15.9)
LYMPH%: 23.4 % (ref 14.0–49.7)
MCH: 29.2 pg (ref 25.1–34.0)
MCHC: 31.8 g/dL (ref 31.5–36.0)
MCV: 92 fL (ref 79.5–101.0)
MONO#: 0.6 10*3/uL (ref 0.1–0.9)
MONO%: 5.8 % (ref 0.0–14.0)
NEUT#: 7.3 10*3/uL — ABNORMAL HIGH (ref 1.5–6.5)
NEUT%: 69.9 % (ref 38.4–76.8)
PLATELETS: 382 10*3/uL (ref 145–400)
RBC: 3.49 10*6/uL — AB (ref 3.70–5.45)
RDW: 15 % — AB (ref 11.2–14.5)
WBC: 10.5 10*3/uL — AB (ref 3.9–10.3)
lymph#: 2.5 10*3/uL (ref 0.9–3.3)

## 2017-03-03 LAB — COMPREHENSIVE METABOLIC PANEL
ALT: 62 U/L — AB (ref 0–55)
ANION GAP: 12 meq/L — AB (ref 3–11)
AST: 93 U/L — ABNORMAL HIGH (ref 5–34)
Albumin: 2.7 g/dL — ABNORMAL LOW (ref 3.5–5.0)
Alkaline Phosphatase: 175 U/L — ABNORMAL HIGH (ref 40–150)
BUN: 9.7 mg/dL (ref 7.0–26.0)
CALCIUM: 9.6 mg/dL (ref 8.4–10.4)
CO2: 23 mEq/L (ref 22–29)
CREATININE: 0.9 mg/dL (ref 0.6–1.1)
Chloride: 103 mEq/L (ref 98–109)
EGFR: >60 mL/min/{1.73_m2} (ref >60)
Glucose: 86 mg/dl (ref 70–140)
POTASSIUM: 3.9 meq/L (ref 3.5–5.1)
Sodium: 137 mEq/L (ref 136–145)
Total Bilirubin: 0.68 mg/dL (ref 0.20–1.20)
Total Protein: 8 g/dL (ref 6.4–8.3)

## 2017-03-03 NOTE — Telephone Encounter (Signed)
Gave patient avs and calendar with appts per 11/12 los.  °

## 2017-03-03 NOTE — Assessment & Plan Note (Signed)
I am concerned about her recurrent vaginal bleeding and recent progressive weight loss The  because of the vaginal bleeding is hard to assess, whether it could be due to cancer progression versus self douching Due to unreliability of history, I recommend CT scan to be done next week and she agreed to proceed

## 2017-03-03 NOTE — Assessment & Plan Note (Signed)
She has significant protein calorie malnutrition and poor oral intake I encouraged her to increase oral intake as tolerated As above, I plan to order CT scan for further assessment to rule out disease progression

## 2017-03-03 NOTE — Progress Notes (Signed)
[facility] OFFICE PROGRESS NOTE  Patient Care Team: Avbuere, Edwin, MD as PCP - General (Internal Medicine)  SUMMARY OF ONCOLOGIC HISTORY: Oncology History   Focally ER positive MSI stable disease     Endometrial/uterine adenocarcinoma (HCC)   08/01/2016 Initial Diagnosis    She was admitted to  [facility] on 08/01/16 with postmenopausal vaginal bleeding lasting 10 days. However, she was transferred to [facility] on 08/06/16 for suicidal ideation. There, GYN was consulted and ordered biopsy. Pt was transferred to medical unit for blood transfusion.       08/10/2016 Imaging    US pelvis:  The uterus is prominent and heterogeneous measuring 10.9 x 6.3 x 6.1 cm. There is a probable anterior exophytic fundal fibroid measuring 4.5 cm. The endometrial stripe is normal in thickness at 6.4 mm. I see no fluid in the canal  Right ovary is not visible. Left ovary 2.3 x 1.7 x 1.9 cm with normal echogenicity. Color flow and spectral Doppler: Normal arterial inflow and venous outflow.  There is no significant free fluid.   I see no adnexal masses.      08/11/2016 - 08/15/2016 Hospital Admission    She was admitted voluntarily to Psychiatry unit 08/06/2016 for suicidal ideation.She was evaluated in an outside facility due to vaginal bleeding and endometrial and endocervical biopsies performed. Pathology revealed a poorly differentiated cancer with positive estrogen receptors felt to be suggestive of endometrial cancer. The patient received 2 units of prbcs total during her stay for anemia.        08/11/2016 Pathology Results       1. Endocervix, curettings and polyp. (*please see comment below). --High-grade carcinoma, undifferentiated.  2. Endometrial biopsy:(*please see comment below). --High-grade carcinoma, undifferentiated. --Weakly positive for estrogen receptors suggestive of endometrial origin; Negative for progesterone receptors and neutral  mucin..       08/14/2016 Imaging    CT imaging 1. Uterine mass and abnormality consistent with clinical history of endometrial carcinoma as described above with bilateral pelvic and retroperitoneal adenopathy consistent with nodal metastasis. 2. No evidence of nonnodal distant metastasis. 3. Nonspecific incidental finding of hypertrophy of the pylorus muscle. 4. incidental finding of cholelithiasis      09/04/2016 Imaging    CT chest: 1. 4 mm nodule in the anterior aspect of the right upper lobe. This is highly nonspecific. Although metastatic disease is not excluded, it is not strongly favored. Attention under routine followup examinations is recommended to ensure the stability or resolution of this finding. 2. Trace right pleural effusion lying dependently is simple in appearance. 3. Mild cardiomegaly.      09/05/2016 Procedure    Successful placement of a right internal jugular approach power injectable Port-A-Cath. The catheter is ready for immediate use.      09/05/2016 Tumor Marker    Patient's tumor was tested for the following markers: CA125 Results of the tumor marker test revealed 1150      09/09/2016 - 12/30/2016 Chemotherapy    She received chemotherapy with carboplatin and Taxol x 6 cycles      09/30/2016 Tumor Marker    Patient's tumor was tested for the following markers: CA125 Results of the tumor marker test revealed 338.1      10/28/2016 Tumor Marker    Patient's tumor was tested for the following markers: CA125 Results of the tumor marker test revealed 210.9      11/08/2016 Imaging    CT scan abdomen and pelvis 1. Soft   tissue nodularity throughout the omental and perihepatic fat worrisome for peritoneal carcinomatosis. No ascites. 2. No other definitive evidence of metastatic disease. There are prominent left pelvic sidewall and right inguinal lymph nodes. 3. Nonspecific findings in the uterus, likely related to treated tumor and fibroids. Prior pelvic imaging  unavailable. 4. Cholelithiasis.      12/09/2016 Tumor Marker    Patient's tumor was tested for the following markers: CA125 Results of the tumor marker test revealed 98.1      12/30/2016 Tumor Marker    Patient's tumor was tested for the following markers: CA125 Results of the tumor marker test revealed 106.1      01/07/2017 Imaging    1. Stable soft tissue nodularity along the omentum suspicious for peritoneal spread of tumor. Stable appearance of nodularity along the uterine fundus and indistinctness along the right adnexa, some of this may be due to fibroids. 2. The previously borderline enlarged left external iliac lymph node is now normal in size. 3. Stable 4 mm ill-defined right upper lobe pulmonary nodule anteriorly, this may well in depth being benign but merits surveillance. 4. Cholelithiasis.       INTERVAL HISTORY: Please see below for problem oriented charting. She returns with her son, Annalee Genta for further evaluation She had recent hospitalization for her mental disorder The patient felt a bit better recently with stable mood She has lost a bit of weight due to poor oral intake She denies nausea, vomiting or changes in bowel habits She have recurrence of vaginal bleeding for some time but she could not recall for how long She averaged about 3-4 sanitary pads per day with a small spoonful blood loss per day She denies abdominal cramp She denies abdominal distention No recent fever or chills She stated that she is compliant taking her tamoxifen and Megace She denies recent hot flashes  REVIEW OF SYSTEMS:   Constitutional: Denies fevers, chills  Eyes: Denies blurriness of vision Ears, nose, mouth, throat, and face: Denies mucositis or sore throat Respiratory: Denies cough, dyspnea or wheezes Cardiovascular: Denies palpitation, chest discomfort or lower extremity swelling Gastrointestinal:  Denies nausea, heartburn or change in bowel habits Skin: Denies abnormal skin  rashes Lymphatics: Denies new lymphadenopathy or easy bruising Neurological:Denies numbness, tingling or new weaknesses Behavioral/Psych: Mood is stable, no new changes  All other systems were reviewed with the patient and are negative.  I have reviewed the past medical history, past surgical history, social history and family history with the patient and they are unchanged from previous note.  ALLERGIES:  has No Known Allergies.  MEDICATIONS:  Current Outpatient Medications  Medication Sig Dispense Refill  . HYDROcodone-acetaminophen (NORCO/VICODIN) 5-325 MG tablet Take 1 tablet by mouth every 6 (six) hours as needed for moderate pain. 30 tablet 0  . lidocaine-prilocaine (EMLA) cream Apply to affected area once 30 g 3  . megestrol (MEGACE) 40 MG tablet Take 2 tabs twice a day for 3 weeks, then rest 3 weeks (to alternate with prescription of taxomifen) 84 tablet 11  . Omega-3 Fatty Acids (FISH OIL) 1000 MG CAPS Take 100 mg by mouth daily.    . ondansetron (ZOFRAN) 8 MG tablet Take 1 tablet (8 mg total) by mouth 2 (two) times daily as needed for refractory nausea / vomiting. Start on day 3 after chemo. 30 tablet 1  . prochlorperazine (COMPAZINE) 10 MG tablet Take 1 tablet (10 mg total) by mouth every 6 (six) hours as needed (Nausea or vomiting). 30 tablet 1  .  tamoxifen (NOLVADEX) 20 MG tablet Take 1 tab twice a day for 3 weeks then rest (to alternate with prescription of Megace) 42 tablet 11   No current facility-administered medications for this visit.     PHYSICAL EXAMINATION: ECOG PERFORMANCE STATUS: 2 - Symptomatic, <50% confined to bed  Vitals:   03/03/17 1326  BP: 124/73  Pulse: 99  Resp: 18  Temp: 98.4 F (36.9 C)  SpO2: 98%   Filed Weights   03/03/17 1326  Weight: 176 lb 3.2 oz (79.9 kg)    GENERAL:alert, no distress and comfortable.  She looks thinner SKIN: skin color, texture, turgor are normal, no rashes or significant lesions EYES: normal, Conjunctiva are pink  and non-injected, sclera clear OROPHARYNX:no exudate, no erythema and lips, buccal mucosa, and tongue normal  NECK: supple, thyroid normal size, non-tender, without nodularity LYMPH:  no palpable lymphadenopathy in the cervical, axillary or inguinal LUNGS: clear to auscultation and percussion with normal breathing effort HEART: regular rate & rhythm and no murmurs and no lower extremity edema ABDOMEN:abdomen soft, non-tender and normal bowel sounds Musculoskeletal:no cyanosis of digits and no clubbing  NEURO: alert & oriented x 3 with fluent speech, no focal motor/sensory deficits  LABORATORY DATA:  I have reviewed the data as listed    Component Value Date/Time   NA 137 03/03/2017 1310   K 3.9 03/03/2017 1310   CL 102 02/04/2017 1200   CO2 23 03/03/2017 1310   GLUCOSE 86 03/03/2017 1310   BUN 9.7 03/03/2017 1310   CREATININE 0.9 03/03/2017 1310   CALCIUM 9.6 03/03/2017 1310   PROT 8.0 03/03/2017 1310   ALBUMIN 2.7 (L) 03/03/2017 1310   AST 93 (H) 03/03/2017 1310   ALT 62 (H) 03/03/2017 1310   ALKPHOS 175 (H) 03/03/2017 1310   BILITOT 0.68 03/03/2017 1310   GFRNONAA >60 02/04/2017 1200   GFRAA >60 02/04/2017 1200    No results found for: SPEP, UPEP  Lab Results  Component Value Date   WBC 10.5 (H) 03/03/2017   NEUTROABS 7.3 (H) 03/03/2017   HGB 10.2 (L) 03/03/2017   HCT 32.1 (L) 03/03/2017   MCV 92.0 03/03/2017   PLT 382 03/03/2017      Chemistry      Component Value Date/Time   NA 137 03/03/2017 1310   K 3.9 03/03/2017 1310   CL 102 02/04/2017 1200   CO2 23 03/03/2017 1310   BUN 9.7 03/03/2017 1310   CREATININE 0.9 03/03/2017 1310      Component Value Date/Time   CALCIUM 9.6 03/03/2017 1310   ALKPHOS 175 (H) 03/03/2017 1310   AST 93 (H) 03/03/2017 1310   ALT 62 (H) 03/03/2017 1310   BILITOT 0.68 03/03/2017 1310       RADIOGRAPHIC STUDIES: I have personally reviewed the radiological images as listed and agreed with the findings in the report. Dg Chest  1 View  Result Date: 02/04/2017 CLINICAL DATA:  For psychiatric evaluation EXAM: CHEST 1 VIEW COMPARISON:  CT chest of 01/07/2017 and chest x-ray of 08/04/2016 FINDINGS: No active infiltrate or effusion is seen. Mediastinal and hilar contours are unremarkable. The heart is mildly enlarged. A right-sided Port-A-Cath is present with the tip overlying the lower SVC. No bony abnormality is seen. IMPRESSION: No active lung disease.  Port-A-Cath tip overlies the lower SVC. Electronically Signed   By: Ivar Drape M.D.   On: 02/04/2017 15:09    ASSESSMENT & PLAN:  Endometrial/uterine adenocarcinoma ([place]) I am concerned about her recurrent vaginal bleeding and  recent progressive weight loss The  because of the vaginal bleeding is hard to assess, whether it could be due to cancer progression versus self douching Due to unreliability of history, I recommend CT scan to be done next week and she agreed to proceed  Anemia, chronic disease It is likely due to recent vaginal bleeding and anemia chronic disease.  Will observe Closely  Elevated liver enzymes She has recurrence of elevated liver enzymes, cause is unknown could be due to medications versus cancer progression Will order CT scan for further assessment  Bipolar 1 disorder, mixed, severe ([place]) The patient have difficult to follow thought processes She was recently hospitalized briefly Today, her mental state appears to be stable She will continue medical management  Protein-calorie malnutrition, moderate ([place]) She has significant protein calorie malnutrition and poor oral intake I encouraged her to increase oral intake as tolerated As above, I plan to order CT scan for further assessment to rule out disease progression   Orders Placed This Encounter  Procedures  . CT ABDOMEN PELVIS W CONTRAST    Standing Status:   Future    Standing Expiration Date:   03/03/2018    Order Specific Question:   If indicated for the ordered procedure, I  authorize the administration of contrast media per Radiology protocol    Answer:   Yes    Order Specific Question:   Preferred imaging location?    Answer:   [facility]    Order Specific Question:   Radiology Contrast Protocol - do NOT remove file path    Answer:   _[email or web address]   All questions were answered. The patient knows to call the clinic with any problems, questions or concerns. No barriers to learning was detected. I spent 30 minutes counseling the patient face to face. The total time spent in the appointment was 40 minutes and more than 50% was on counseling and review of test results     Heath Lark, MD 03/03/2017 4:11 PM

## 2017-03-03 NOTE — Assessment & Plan Note (Signed)
It is likely due to recent vaginal bleeding and anemia chronic disease.  Will observe Closely

## 2017-03-03 NOTE — Assessment & Plan Note (Signed)
The patient have difficult to follow thought processes She was recently hospitalized briefly Today, her mental state appears to be stable She will continue medical management

## 2017-03-03 NOTE — Assessment & Plan Note (Signed)
She has recurrence of elevated liver enzymes, cause is unknown could be due to medications versus cancer progression Will order CT scan for further assessment

## 2017-03-04 LAB — CA 125: CANCER ANTIGEN (CA) 125: 694.5 U/mL — AB (ref 0.0–38.1)

## 2017-03-07 ENCOUNTER — Ambulatory Visit (HOSPITAL_COMMUNITY)
Admission: RE | Admit: 2017-03-07 | Discharge: 2017-03-07 | Disposition: A | Discharge status: Home / Self Care | Payer: Medicare Other | Source: Ambulatory Visit | Attending: Hematology and Oncology | Admitting: Hematology and Oncology

## 2017-03-07 ENCOUNTER — Encounter (HOSPITAL_COMMUNITY): Payer: Self-pay

## 2017-03-07 DIAGNOSIS — R590 Localized enlarged lymph nodes: Secondary | ICD-10-CM | POA: Diagnosis not present

## 2017-03-07 DIAGNOSIS — N852 Hypertrophy of uterus: Secondary | ICD-10-CM | POA: Insufficient documentation

## 2017-03-07 DIAGNOSIS — K8020 Calculus of gallbladder without cholecystitis without obstruction: Secondary | ICD-10-CM | POA: Insufficient documentation

## 2017-03-07 DIAGNOSIS — C541 Malignant neoplasm of endometrium: Secondary | ICD-10-CM | POA: Insufficient documentation

## 2017-03-07 MED ORDER — IOPAMIDOL (ISOVUE-300) INJECTION 61%
INTRAVENOUS | Status: AC
  Administered 2017-03-07: 100 mL via INTRAVENOUS
  Filled 2017-03-07: qty 100

## 2017-03-07 MED ORDER — IOPAMIDOL (ISOVUE-300) INJECTION 61%
100.0000 mL | Freq: Once | INTRAVENOUS | Status: AC | PRN
  Administered 2017-03-07: 100 mL via INTRAVENOUS

## 2017-03-11 ENCOUNTER — Ambulatory Visit (HOSPITAL_BASED_OUTPATIENT_CLINIC_OR_DEPARTMENT_OTHER): Payer: Medicare Other | Admitting: Hematology and Oncology

## 2017-03-11 ENCOUNTER — Ambulatory Visit (HOSPITAL_BASED_OUTPATIENT_CLINIC_OR_DEPARTMENT_OTHER): Payer: Medicare Other

## 2017-03-11 ENCOUNTER — Telehealth: Payer: Self-pay | Admitting: Hematology and Oncology

## 2017-03-11 VITALS — BP 132/85 | HR 110 | Temp 98.1°F | Resp 18 | Ht 64.0 in | Wt 174.9 lb

## 2017-03-11 DIAGNOSIS — D638 Anemia in other chronic diseases classified elsewhere: Secondary | ICD-10-CM

## 2017-03-11 DIAGNOSIS — Z7189 Other specified counseling: Secondary | ICD-10-CM

## 2017-03-11 DIAGNOSIS — C541 Malignant neoplasm of endometrium: Secondary | ICD-10-CM | POA: Diagnosis not present

## 2017-03-11 DIAGNOSIS — G893 Neoplasm related pain (acute) (chronic): Secondary | ICD-10-CM | POA: Diagnosis not present

## 2017-03-11 DIAGNOSIS — R748 Abnormal levels of other serum enzymes: Secondary | ICD-10-CM

## 2017-03-11 LAB — COMPREHENSIVE METABOLIC PANEL
ALBUMIN: 2.8 g/dL — AB (ref 3.5–5.0)
ALK PHOS: 103 U/L (ref 40–150)
ALT: 132 U/L — AB (ref 0–55)
ANION GAP: 12 meq/L — AB (ref 3–11)
AST: 136 U/L — AB (ref 5–34)
BUN: 8.9 mg/dL (ref 7.0–26.0)
CO2: 22 mEq/L (ref 22–29)
Calcium: 9.8 mg/dL (ref 8.4–10.4)
Chloride: 101 mEq/L (ref 98–109)
Creatinine: 0.9 mg/dL (ref 0.6–1.1)
GLUCOSE: 82 mg/dL (ref 70–140)
POTASSIUM: 4.2 meq/L (ref 3.5–5.1)
SODIUM: 135 meq/L — AB (ref 136–145)
TOTAL PROTEIN: 8.2 g/dL (ref 6.4–8.3)
Total Bilirubin: 0.75 mg/dL (ref 0.20–1.20)

## 2017-03-11 LAB — CBC WITH DIFFERENTIAL/PLATELET
BASO%: 0.4 % (ref 0.0–2.0)
Basophils Absolute: 0 10*3/uL (ref 0.0–0.1)
EOS%: 1.5 % (ref 0.0–7.0)
Eosinophils Absolute: 0.1 10*3/uL (ref 0.0–0.5)
HEMATOCRIT: 31 % — AB (ref 34.8–46.6)
HEMOGLOBIN: 10.2 g/dL — AB (ref 11.6–15.9)
LYMPH%: 23.9 % (ref 14.0–49.7)
MCH: 29.3 pg (ref 25.1–34.0)
MCHC: 32.8 g/dL (ref 31.5–36.0)
MCV: 89.3 fL (ref 79.5–101.0)
MONO#: 0.6 10*3/uL (ref 0.1–0.9)
MONO%: 6.9 % (ref 0.0–14.0)
NEUT%: 67.3 % (ref 38.4–76.8)
NEUTROS ABS: 5.9 10*3/uL (ref 1.5–6.5)
PLATELETS: 491 10*3/uL — AB (ref 145–400)
RBC: 3.47 10*6/uL — ABNORMAL LOW (ref 3.70–5.45)
RDW: 16 % — AB (ref 11.2–14.5)
WBC: 8.8 10*3/uL (ref 3.9–10.3)
lymph#: 2.1 10*3/uL (ref 0.9–3.3)

## 2017-03-11 MED ORDER — HYDROCODONE-ACETAMINOPHEN 10-325 MG PO TABS: 1.0000 | ORAL_TABLET | ORAL | 0 refills | Status: DC | PRN

## 2017-03-11 NOTE — Telephone Encounter (Signed)
Gave avs and calendar for November however the infusion had to be scheduled in admission.

## 2017-03-11 NOTE — Progress Notes (Signed)
DISCONTINUE OFF PATHWAY REGIMEN - Uterine   [id number]:Carboplatin AUC=6 + Paclitaxel 175 mg/m2 q21 Days:   A cycle is every 21 days:     Paclitaxel      Carboplatin   **Always confirm dose/schedule in your pharmacy ordering system**    REASON: Disease Progression PRIOR TREATMENT: Off Pathway: Carboplatin AUC=6 + Paclitaxel 175 mg/m2 q21 Days TREATMENT RESPONSE: Progressive Disease (PD)  START OFF PATHWAY REGIMEN - Uterine   [id number]:Cisplatin + Doxorubicin:   A cycle is every 21 days:     Doxorubicin      Cisplatin   **Always confirm dose/schedule in your pharmacy ordering system**    Patient Characteristics: Papillary Serous and Clear Cell Histology, Second Line, Relapse < 6 Months From Prior Therapy AJCC T Category: T3 AJCC N Category: N1 AJCC M Category: M0 AJCC 8 Stage Grouping: IIIC1 Would you be surprised if this patient died  in the next year<= I would be surprised if this patient died in the next year Line of therapy: Second Line Time to Recurrence: Relapse < 6 Months From Prior Therapy Intent of Therapy: Non-Curative / Palliative Intent, Discussed with Patient

## 2017-03-12 ENCOUNTER — Telehealth: Payer: Self-pay | Admitting: *Deleted

## 2017-03-12 ENCOUNTER — Encounter: Payer: Self-pay | Admitting: Hematology and Oncology

## 2017-03-12 DIAGNOSIS — Z7189 Other specified counseling: Secondary | ICD-10-CM | POA: Insufficient documentation

## 2017-03-12 DIAGNOSIS — G893 Neoplasm related pain (acute) (chronic): Secondary | ICD-10-CM | POA: Insufficient documentation

## 2017-03-12 NOTE — Assessment & Plan Note (Signed)
She has significant anemia secondary to mild vaginal blood loss She is not symptomatic Continue close observation

## 2017-03-12 NOTE — Telephone Encounter (Signed)
Notified of message below.  ECHO being scheduled at [facility]- son to call them today

## 2017-03-12 NOTE — Assessment & Plan Note (Signed)
Unfortunately, she is experiencing significant disease progression CT imaging show heavy disease burden and the tumor marker is markedly elevated The patient overall is not doing well I recommend we proceed with second line treatment as soon as possible I reviewed with her son extensively the plan of care We reviewed the current guidelines.  She agree with her son that she would like to proceed with aggressive second line of chemotherapy. We reviewed the current guidelines The recommendation is based on publication below:  J Clin Oncol. 2004 Oct 1;22(19):3902-8. Phase III trial of doxorubicin with or without cisplatin in advanced endometrial carcinoma: a gynecologic oncology group study. Thigpen JT1, Mirian Mo, Homesley HD, Malfetano J, DuBeshter B, [place], Rosamaria Lints. Abstract PURPOSE:  Doxorubicin and cisplatin have activity in endometrial carcinoma and at initiation of this study ranked as the most active agents. This trial of stage III, IV, or recurrent disease evaluated whether combining these agents increases response rate (RR) and prolongs progression-free survival (PFS) and overall survival (OS) over doxorubicin alone. PATIENTS AND METHODS:  Of 299 patients registered, 281 (94%) were eligible. Regimens were doxorubicin 60 mg/m(2) intravenously or doxorubicin 60 mg/m(2) plus cisplatin 50 mg/m(2) every 3 weeks until disease progression, unacceptable toxicity, or a total of 500 mg/m(2) doxorubicin. RESULTS:  There were 12 (8%) complete (CR) and 26 (17%) partial responses (PR) among 150 patients receiving doxorubicin versus 25 (19%) CRs and 30 (23%) PRs among patients receiving the combination. The overall response rate was higher among patients receiving the combination (42%) compared with patients receiving doxorubicin (25%; P =.004). Median PFS was 5.7 and 3.8 months, respectively, for the combination and single agent. The PFS hazard ratio was 0.736 (95% CI, 0.577 to 0.939; P =.014). Median  OS was 9.0 and 9.2 months, respectively, for the combination and single agent. Overall death rates were similar in the two groups (hazard ratio, 0.928; 95% CI, 0.727 to 1.185). Nausea, vomiting, and hematologic toxicities were common. The combination produced more grade 3 to 4 leukopenia (62% v 40%), thrombocytopenia (14% v 2%), anemia (22% v 4%), and nausea/vomiting (13% v 3%). CONCLUSION:  Adding cisplatin to doxorubicin in advanced endometrial carcinoma improves RR and PFS with a negligible impact on OS and produces increased toxicity. These results have served as a building block for subsequent phase III trials in patients with disseminated and high-risk limited endometrial carcinoma.  We discussed the role of chemotherapy. The intent is of palliative intent.  We discussed some of the risks, benefits, side-effects of cisplatin  Some of the short term side-effects included, though not limited to, including weight loss, life threatening infections, risk of allergic reactions, need for transfusions of blood products, nausea, vomiting, change in bowel habits, loss of hair, risk of congestive heart failure, admission to hospital for various reasons, and risks of death.   Long term side-effects are also discussed including risks of infertility, permanent damage to nerve function, hearing loss, chronic fatigue, kidney damage with possibility needing hemodialysis, and rare secondary malignancy including bone marrow disorders.  The patient is aware that the response rates discussed earlier is not guaranteed.  After a long discussion, patient made an informed decision to proceed with the prescribed plan of care.  I will order pretreatment echocardiogram. Given her frail status, I will see her on a weekly basis for supportive care.

## 2017-03-12 NOTE — Telephone Encounter (Signed)
ECHO scheduled for 11/27

## 2017-03-12 NOTE — Telephone Encounter (Signed)
-----   Message from Heath Lark, MD sent at 03/12/2017  8:37 AM EST ----- Regarding: ECHO Most important we need to get ECHO done before we can proceed with chemo Also, remind her son she can stop Megace and Tamoxifen

## 2017-03-12 NOTE — Assessment & Plan Note (Signed)
She has significant liver enzymes abnormalities CT imaging showed no evidence of cancer within the liver parenchyma but she has significant peritoneal disease especially in the area just outside the liver that very well likely could be the cause of this For now, I recommend close observation and medication adjustment I would recommend her to discontinue Megace and tamoxifen

## 2017-03-12 NOTE — Progress Notes (Signed)
[facility] OFFICE PROGRESS NOTE  Patient Care Team: Avbuere, Edwin, MD as PCP - General (Internal Medicine)  SUMMARY OF ONCOLOGIC HISTORY: Oncology History   Focally ER positive MSI stable disease     Endometrial/uterine adenocarcinoma (HCC)   08/01/2016 Initial Diagnosis    She was admitted to  [facility] on 08/01/16 with postmenopausal vaginal bleeding lasting 10 days. However, she was transferred to [facility] on 08/06/16 for suicidal ideation. There, GYN was consulted and ordered biopsy. Pt was transferred to medical unit for blood transfusion.       08/10/2016 Imaging    US pelvis:  The uterus is prominent and heterogeneous measuring 10.9 x 6.3 x 6.1 cm. There is a probable anterior exophytic fundal fibroid measuring 4.5 cm. The endometrial stripe is normal in thickness at 6.4 mm. I see no fluid in the canal  Right ovary is not visible. Left ovary 2.3 x 1.7 x 1.9 cm with normal echogenicity. Color flow and spectral Doppler: Normal arterial inflow and venous outflow.  There is no significant free fluid.   I see no adnexal masses.      08/11/2016 - 08/15/2016 Hospital Admission    She was admitted voluntarily to Psychiatry unit 08/06/2016 for suicidal ideation.She was evaluated in an outside facility due to vaginal bleeding and endometrial and endocervical biopsies performed. Pathology revealed a poorly differentiated cancer with positive estrogen receptors felt to be suggestive of endometrial cancer. The patient received 2 units of prbcs total during her stay for anemia.        08/11/2016 Pathology Results       1. Endocervix, curettings and polyp. (*please see comment below). --High-grade carcinoma, undifferentiated.  2. Endometrial biopsy:(*please see comment below). --High-grade carcinoma, undifferentiated. --Weakly positive for estrogen receptors suggestive of endometrial origin; Negative for progesterone receptors and neutral  mucin..       08/14/2016 Imaging    CT imaging 1. Uterine mass and abnormality consistent with clinical history of endometrial carcinoma as described above with bilateral pelvic and retroperitoneal adenopathy consistent with nodal metastasis. 2. No evidence of nonnodal distant metastasis. 3. Nonspecific incidental finding of hypertrophy of the pylorus muscle. 4. incidental finding of cholelithiasis      09/04/2016 Imaging    CT chest: 1. 4 mm nodule in the anterior aspect of the right upper lobe. This is highly nonspecific. Although metastatic disease is not excluded, it is not strongly favored. Attention under routine followup examinations is recommended to ensure the stability or resolution of this finding. 2. Trace right pleural effusion lying dependently is simple in appearance. 3. Mild cardiomegaly.      09/05/2016 Procedure    Successful placement of a right internal jugular approach power injectable Port-A-Cath. The catheter is ready for immediate use.      09/05/2016 Tumor Marker    Patient's tumor was tested for the following markers: CA125 Results of the tumor marker test revealed 1150      09/09/2016 - 12/30/2016 Chemotherapy    She received chemotherapy with carboplatin and Taxol x 6 cycles      09/30/2016 Tumor Marker    Patient's tumor was tested for the following markers: CA125 Results of the tumor marker test revealed 338.1      10/28/2016 Tumor Marker    Patient's tumor was tested for the following markers: CA125 Results of the tumor marker test revealed 210.9      11/08/2016 Imaging    CT scan abdomen and pelvis 1. Soft   tissue nodularity throughout the omental and perihepatic fat worrisome for peritoneal carcinomatosis. No ascites. 2. No other definitive evidence of metastatic disease. There are prominent left pelvic sidewall and right inguinal lymph nodes. 3. Nonspecific findings in the uterus, likely related to treated tumor and fibroids. Prior pelvic imaging  unavailable. 4. Cholelithiasis.      12/09/2016 Tumor Marker    Patient's tumor was tested for the following markers: CA125 Results of the tumor marker test revealed 98.1      12/30/2016 Tumor Marker    Patient's tumor was tested for the following markers: CA125 Results of the tumor marker test revealed 106.1      01/07/2017 Imaging    1. Stable soft tissue nodularity along the omentum suspicious for peritoneal spread of tumor. Stable appearance of nodularity along the uterine fundus and indistinctness along the right adnexa, some of this may be due to fibroids. 2. The previously borderline enlarged left external iliac lymph node is now normal in size. 3. Stable 4 mm ill-defined right upper lobe pulmonary nodule anteriorly, this may well in depth being benign but merits surveillance. 4. Cholelithiasis.      03/03/2017 Tumor Marker    Patient's tumor was tested for the following markers: CA125 Results of the tumor marker test revealed 694.5      03/07/2017 Imaging    1. Clear interval progression of peritoneal and omental disease as above. Diffuse mesenteric thickening and nodularity has progressed. 2. New juxta diaphragmatic lymph nodes compatible with metastatic disease. 3. Cholelithiasis. 4. Uterine enlargement with probable fibroid change. Ill-defined hypovascular lesion in the lower uterine segment/cervix may represent primary malignancy although this is difficult to assess by CT.       INTERVAL HISTORY: Please see below for problem oriented charting. She returns with her son for further follow-up She continues to have intermittent vaginal bleeding and abdominal discomfort She denies nausea or vomiting No real changes in bowel habits She is frail with poor appetite.  REVIEW OF SYSTEMS:   Constitutional: Denies fevers, chills or abnormal weight loss Eyes: Denies blurriness of vision Ears, nose, mouth, throat, and face: Denies mucositis or sore throat Respiratory: Denies  cough, dyspnea or wheezes Cardiovascular: Denies palpitation, chest discomfort or lower extremity swelling Gastrointestinal:  Denies nausea, heartburn or change in bowel habits Skin: Denies abnormal skin rashes Lymphatics: Denies new lymphadenopathy or easy bruising Neurological:Denies numbness, tingling or new weaknesses Behavioral/Psych: Mood is stable, no new changes  All other systems were reviewed with the patient and are negative.  I have reviewed the past medical history, past surgical history, social history and family history with the patient and they are unchanged from previous note.  ALLERGIES:  has No Known Allergies.  MEDICATIONS:  Current Outpatient Medications  Medication Sig Dispense Refill  . HYDROcodone-acetaminophen (NORCO) 10-325 MG tablet Take 1 tablet by mouth every 4 (four) hours as needed. 60 tablet 0  . HYDROcodone-acetaminophen (NORCO/VICODIN) 5-325 MG tablet Take 1 tablet by mouth every 6 (six) hours as needed for moderate pain. 30 tablet 0  . Omega-3 Fatty Acids (FISH OIL) 1000 MG CAPS Take 100 mg by mouth daily.     No current facility-administered medications for this visit.     PHYSICAL EXAMINATION: ECOG PERFORMANCE STATUS: 2 - Symptomatic, <50% confined to bed  Vitals:   03/11/17 1253  BP: 132/85  Pulse: (!) 110  Resp: 18  Temp: 98.1 F (36.7 C)  SpO2: 98%   Filed Weights   03/11/17 1253  Weight: 174 lb  14.4 oz (79.3 kg)    GENERAL:alert, no distress and comfortable SKIN: skin color, texture, turgor are normal, no rashes or significant lesions EYES: normal, Conjunctiva are pink and non-injected, sclera clear OROPHARYNX:no exudate, no erythema and lips, buccal mucosa, and tongue normal  NECK: supple, thyroid normal size, non-tender, without nodularity LYMPH:  no palpable lymphadenopathy in the cervical, axillary or inguinal LUNGS: clear to auscultation and percussion with normal breathing effort HEART: regular rate & rhythm and no murmurs  and no lower extremity edema ABDOMEN:abdomen soft, non-tender and normal bowel sounds Musculoskeletal:no cyanosis of digits and no clubbing  NEURO: alert & oriented x 3 with fluent speech, no focal motor/sensory deficits  LABORATORY DATA:  I have reviewed the data as listed    Component Value Date/Time   NA 135 (L) 03/11/2017 1430   K 4.2 03/11/2017 1430   CL 102 02/04/2017 1200   CO2 22 03/11/2017 1430   GLUCOSE 82 03/11/2017 1430   BUN 8.9 03/11/2017 1430   CREATININE 0.9 03/11/2017 1430   CALCIUM 9.8 03/11/2017 1430   PROT 8.2 03/11/2017 1430   ALBUMIN 2.8 (L) 03/11/2017 1430   AST 136 (H) 03/11/2017 1430   ALT 132 (H) 03/11/2017 1430   ALKPHOS 103 03/11/2017 1430   BILITOT 0.75 03/11/2017 1430   GFRNONAA >60 02/04/2017 1200   GFRAA >60 02/04/2017 1200    No results found for: SPEP, UPEP  Lab Results  Component Value Date   WBC 8.8 03/11/2017   NEUTROABS 5.9 03/11/2017   HGB 10.2 (L) 03/11/2017   HCT 31.0 (L) 03/11/2017   MCV 89.3 03/11/2017   PLT 491 (H) 03/11/2017      Chemistry      Component Value Date/Time   NA 135 (L) 03/11/2017 1430   K 4.2 03/11/2017 1430   CL 102 02/04/2017 1200   CO2 22 03/11/2017 1430   BUN 8.9 03/11/2017 1430   CREATININE 0.9 03/11/2017 1430      Component Value Date/Time   CALCIUM 9.8 03/11/2017 1430   ALKPHOS 103 03/11/2017 1430   AST 136 (H) 03/11/2017 1430   ALT 132 (H) 03/11/2017 1430   BILITOT 0.75 03/11/2017 1430       RADIOGRAPHIC STUDIES: I have reviewed imaging study with her son I have personally reviewed the radiological images as listed and agreed with the findings in the report. Ct Abdomen Pelvis W Contrast  Result Date: 03/08/2017 CLINICAL DATA:  Endometrial cancer EXAM: CT ABDOMEN AND PELVIS WITH CONTRAST TECHNIQUE: Multidetector CT imaging of the abdomen and pelvis was performed using the standard protocol following bolus administration of intravenous contrast. CONTRAST:  165m ISOVUE-300 IOPAMIDOL  (ISOVUE-300) INJECTION 61% COMPARISON:  01/07/2017 FINDINGS: Lower chest: 10 x 21 mm anterior right juxta diaphragmatic lymph node (image 9 series 2) is new in the interval. Hepatobiliary: No focal abnormality within the liver parenchyma. Calcified gallstones again noted. No intrahepatic or extrahepatic biliary dilation. Pancreas: No focal mass lesion. No dilatation of the main duct. No intraparenchymal cyst. No peripancreatic edema. Spleen: No splenomegaly. No focal mass lesion. Adrenals/Urinary Tract: No adrenal nodule or mass. No suspicious abnormality in either kidney. No evidence for hydroureter. The urinary bladder appears normal for the degree of distention. Stomach/Bowel: Stomach is nondistended. No gastric wall thickening. No evidence of outlet obstruction. Duodenum is normally positioned as is the ligament of Treitz. No small bowel wall thickening. No small bowel dilatation. The terminal ileum is normal. The appendix appears to be incorporated in the abnormal soft tissue. No  gross colonic mass. No colonic wall thickening. No substantial diverticular change. Vascular/Lymphatic: No abdominal aortic aneurysm. No abdominal aortic atherosclerotic calcification. No Gastrohepatic or hepatoduodenal ligament lymphadenopathy no retroperitoneal lymphadenopathy. No pelvic sidewall lymphadenopathy. Reproductive: Uterus is enlarged with fibroid change and lesion in the lower uterine segment/ cervix the rare present reported neoplasm. There is no adnexal mass. Other: Omental and peritoneal disease has clearly progressed in the interval. Somewhat confluent peritoneal lesion adjacent to the inferior liver (image 28 series 2) measures 4.2 x 1.5 cm. 2.2 x 1.1 cm nodule identified in the omentum of the hepatic flexure on image 32. More confluent omental disease at sent to the left of the midline forms an area of caking measuring about 15 by 1 cm (image 38 series 2). Nodularity is seen along the peritoneal lining of both para  colic gutters. There is thickening and nodularity of the small bowel mesentery (well demonstrated image 54 series 2). Musculoskeletal: Bone windows reveal no worrisome lytic or sclerotic osseous lesions. IMPRESSION: 1. Clear interval progression of peritoneal and omental disease as above. Diffuse mesenteric thickening and nodularity has progressed. 2. New juxta diaphragmatic lymph nodes compatible with metastatic disease. 3. Cholelithiasis. 4. Uterine enlargement with probable fibroid change. Ill-defined hypovascular lesion in the lower uterine segment/cervix may represent primary malignancy although this is difficult to assess by CT. Electronically Signed   By: Misty Stanley M.D.   On: 03/08/2017 17:06    ASSESSMENT & PLAN:  Endometrial/uterine adenocarcinoma ([place]) Unfortunately, she is experiencing significant disease progression CT imaging show heavy disease burden and the tumor marker is markedly elevated The patient overall is not doing well I recommend we proceed with second line treatment as soon as possible I reviewed with her son extensively the plan of care We reviewed the current guidelines.  She agree with her son that she would like to proceed with aggressive second line of chemotherapy. We reviewed the current guidelines The recommendation is based on publication below:  J Clin Oncol. 2004 Oct 1;22(19):3902-8. Phase III trial of doxorubicin with or without cisplatin in advanced endometrial carcinoma: a gynecologic oncology group study. Thigpen JT1, Mirian Mo, Homesley HD, Malfetano J, DuBeshter B, [place], Rosamaria Lints. Abstract PURPOSE:  Doxorubicin and cisplatin have activity in endometrial carcinoma and at initiation of this study ranked as the most active agents. This trial of stage III, IV, or recurrent disease evaluated whether combining these agents increases response rate (RR) and prolongs progression-free survival (PFS) and overall survival (OS) over doxorubicin alone. PATIENTS  AND METHODS:  Of 299 patients registered, 281 (94%) were eligible. Regimens were doxorubicin 60 mg/m(2) intravenously or doxorubicin 60 mg/m(2) plus cisplatin 50 mg/m(2) every 3 weeks until disease progression, unacceptable toxicity, or a total of 500 mg/m(2) doxorubicin. RESULTS:  There were 12 (8%) complete (CR) and 26 (17%) partial responses (PR) among 150 patients receiving doxorubicin versus 25 (19%) CRs and 30 (23%) PRs among patients receiving the combination. The overall response rate was higher among patients receiving the combination (42%) compared with patients receiving doxorubicin (25%; P =.004). Median PFS was 5.7 and 3.8 months, respectively, for the combination and single agent. The PFS hazard ratio was 0.736 (95% CI, 0.577 to 0.939; P =.014). Median OS was 9.0 and 9.2 months, respectively, for the combination and single agent. Overall death rates were similar in the two groups (hazard ratio, 0.928; 95% CI, 0.727 to 1.185). Nausea, vomiting, and hematologic toxicities were common. The combination produced more grade 3 to 4 leukopenia (62% v 40%),  thrombocytopenia (14% v 2%), anemia (22% v 4%), and nausea/vomiting (13% v 3%). CONCLUSION:  Adding cisplatin to doxorubicin in advanced endometrial carcinoma improves RR and PFS with a negligible impact on OS and produces increased toxicity. These results have served as a building block for subsequent phase III trials in patients with disseminated and high-risk limited endometrial carcinoma.  We discussed the role of chemotherapy. The intent is of palliative intent.  We discussed some of the risks, benefits, side-effects of cisplatin  Some of the short term side-effects included, though not limited to, including weight loss, life threatening infections, risk of allergic reactions, need for transfusions of blood products, nausea, vomiting, change in bowel habits, loss of hair, risk of congestive heart failure, admission to hospital for various  reasons, and risks of death.   Long term side-effects are also discussed including risks of infertility, permanent damage to nerve function, hearing loss, chronic fatigue, kidney damage with possibility needing hemodialysis, and rare secondary malignancy including bone marrow disorders.  The patient is aware that the response rates discussed earlier is not guaranteed.  After a long discussion, patient made an informed decision to proceed with the prescribed plan of care.  I will order pretreatment echocardiogram. Given her frail status, I will see her on a weekly basis for supportive care.   Anemia, chronic disease She has significant anemia secondary to mild vaginal blood loss She is not symptomatic Continue close observation  Elevated liver enzymes She has significant liver enzymes abnormalities CT imaging showed no evidence of cancer within the liver parenchyma but she has significant peritoneal disease especially in the area just outside the liver that very well likely could be the cause of this For now, I recommend close observation and medication adjustment I would recommend her to discontinue Megace and tamoxifen  Goals of care, counseling/discussion The patient is aware she has incurable disease and treatment is strictly palliative. We discussed importance of Advanced Directives and Living will. Currently, her son is a dedicated M POA Previously, we have discussed advanced home care visit/palliative care for additional support but the patient declined  Cancer associated pain She has moderate cancer pain I recommend increasing hydrocodone to 10 mg every 4 hours as needed for pain I would reassess pain control on a weekly basis I warned her about risk of possible constipation   Orders Placed This Encounter  Procedures  . ECHOCARDIOGRAM LIMITED    Standing Status:   Future    Standing Expiration Date:   06/11/2018    Order Specific Question:   Where should this test be  performed    Answer:   [facility]    Order Specific Question:   Perflutren DEFINITY (image enhancing agent) should be administered unless hypersensitivity or allergy exist    Answer:   Administer Perflutren    Order Specific Question:   Expected Date:    Answer:   ASAP  . Hold Tube, Blood Bank    Standing Status:   Standing    Number of Occurrences:   9    Standing Expiration Date:   03/11/2018   All questions were answered. The patient knows to call the clinic with any problems, questions or concerns. No barriers to learning was detected. I spent 50 minutes counseling the patient face to face. The total time spent in the appointment was 70 minutes and more than 50% was on counseling and review of test results     Heath Lark, MD 03/12/2017 8:41 AM

## 2017-03-12 NOTE — Assessment & Plan Note (Signed)
She has moderate cancer pain I recommend increasing hydrocodone to 10 mg every 4 hours as needed for pain I would reassess pain control on a weekly basis I warned her about risk of possible constipation

## 2017-03-12 NOTE — Assessment & Plan Note (Signed)
The patient is aware she has incurable disease and treatment is strictly palliative. We discussed importance of Advanced Directives and Living will. Currently, her son is a dedicated M POA Previously, we have discussed advanced home care visit/palliative care for additional support but the patient declined

## 2017-03-18 ENCOUNTER — Ambulatory Visit (HOSPITAL_COMMUNITY): Payer: Medicare Other | Attending: Cardiology

## 2017-03-18 ENCOUNTER — Other Ambulatory Visit: Payer: Self-pay

## 2017-03-18 DIAGNOSIS — I340 Nonrheumatic mitral (valve) insufficiency: Secondary | ICD-10-CM | POA: Insufficient documentation

## 2017-03-18 DIAGNOSIS — C541 Malignant neoplasm of endometrium: Secondary | ICD-10-CM | POA: Insufficient documentation

## 2017-03-18 LAB — ECHOCARDIOGRAM LIMITED
CHL CUP MV DEC (S): 320
CHL CUP RV SYS PRESS: 32 mmHg
CHL CUP TV REG PEAK VELOCITY: 269 cm/s
E decel time: 320 msec
EERAT: 5.43
FS: 40 % (ref 28–44)
IVS/LV PW RATIO, ED: 0.84
LA diam index: 2 cm/m2
LASIZE: 37 mm
LDCA: 2.27 cm2
LEFT ATRIUM END SYS DIAM: 37 mm
LV PW d: 9.56 mm — AB (ref 0.6–1.1)
LV SIMPSON'S DISK: 63
LV TDI E'LATERAL: 11
LV dias vol index: 30 mL/m2
LV dias vol: 56 mL (ref 46–106)
LV e' LATERAL: 11 cm/s
LV sys vol: 21 mL (ref 14–42)
LVEEAVG: 5.43
LVEEMED: 5.43
LVOTD: 17 mm
LVSYSVOLIN: 11 mL/m2
MVPKAVEL: 87.9 m/s
MVPKEVEL: 59.7 m/s
Stroke v: 35 ml
TDI e' medial: 4.68
TRMAXVEL: 269 cm/s

## 2017-03-19 ENCOUNTER — Ambulatory Visit (HOSPITAL_BASED_OUTPATIENT_CLINIC_OR_DEPARTMENT_OTHER): Payer: Medicare Other | Admitting: Hematology and Oncology

## 2017-03-19 ENCOUNTER — Ambulatory Visit (HOSPITAL_BASED_OUTPATIENT_CLINIC_OR_DEPARTMENT_OTHER): Payer: Medicare Other

## 2017-03-19 ENCOUNTER — Telehealth: Payer: Self-pay | Admitting: Hematology and Oncology

## 2017-03-19 ENCOUNTER — Ambulatory Visit: Payer: Self-pay

## 2017-03-19 DIAGNOSIS — D638 Anemia in other chronic diseases classified elsewhere: Secondary | ICD-10-CM | POA: Diagnosis not present

## 2017-03-19 DIAGNOSIS — C541 Malignant neoplasm of endometrium: Secondary | ICD-10-CM

## 2017-03-19 DIAGNOSIS — R748 Abnormal levels of other serum enzymes: Secondary | ICD-10-CM | POA: Diagnosis not present

## 2017-03-19 DIAGNOSIS — G893 Neoplasm related pain (acute) (chronic): Secondary | ICD-10-CM | POA: Diagnosis not present

## 2017-03-19 LAB — COMPREHENSIVE METABOLIC PANEL
ALBUMIN: 2.9 g/dL — AB (ref 3.5–5.0)
ALK PHOS: 111 U/L (ref 40–150)
ALT: 85 U/L — ABNORMAL HIGH (ref 0–55)
ANION GAP: 12 meq/L — AB (ref 3–11)
AST: 75 U/L — AB (ref 5–34)
BUN: 9.2 mg/dL (ref 7.0–26.0)
CALCIUM: 9.5 mg/dL (ref 8.4–10.4)
CHLORIDE: 104 meq/L (ref 98–109)
CO2: 23 mEq/L (ref 22–29)
Creatinine: 0.9 mg/dL (ref 0.6–1.1)
EGFR: >60 mL/min/{1.73_m2} (ref >60)
Glucose: 97 mg/dl (ref 70–140)
Potassium: 3.8 mEq/L (ref 3.5–5.1)
Sodium: 139 mEq/L (ref 136–145)
Total Bilirubin: 0.59 mg/dL (ref 0.20–1.20)
Total Protein: 8.2 g/dL (ref 6.4–8.3)

## 2017-03-19 LAB — CBC WITH DIFFERENTIAL/PLATELET
BASO%: 0.6 % (ref 0.0–2.0)
Basophils Absolute: 0.1 10*3/uL (ref 0.0–0.1)
EOS ABS: 0.1 10*3/uL (ref 0.0–0.5)
EOS%: 1.2 % (ref 0.0–7.0)
HCT: 31.9 % — ABNORMAL LOW (ref 34.8–46.6)
HEMOGLOBIN: 10.2 g/dL — AB (ref 11.6–15.9)
LYMPH%: 23.4 % (ref 14.0–49.7)
MCH: 28.1 pg (ref 25.1–34.0)
MCHC: 32 g/dL (ref 31.5–36.0)
MCV: 87.8 fL (ref 79.5–101.0)
MONO#: 0.5 10*3/uL (ref 0.1–0.9)
MONO%: 7 % (ref 0.0–14.0)
NEUT%: 67.8 % (ref 38.4–76.8)
NEUTROS ABS: 5.3 10*3/uL (ref 1.5–6.5)
Platelets: 466 10*3/uL — ABNORMAL HIGH (ref 145–400)
RBC: 3.64 10*6/uL — AB (ref 3.70–5.45)
RDW: 15.6 % — AB (ref 11.2–14.5)
WBC: 7.8 10*3/uL (ref 3.9–10.3)
lymph#: 1.8 10*3/uL (ref 0.9–3.3)

## 2017-03-19 NOTE — Telephone Encounter (Signed)
Scheduled appt per 11/28 los- Gave patient AVS and calender per los.  

## 2017-03-20 ENCOUNTER — Other Ambulatory Visit: Payer: Self-pay | Admitting: Hematology and Oncology

## 2017-03-21 ENCOUNTER — Encounter: Payer: Self-pay | Admitting: Hematology and Oncology

## 2017-03-21 ENCOUNTER — Other Ambulatory Visit: Payer: Self-pay

## 2017-03-21 ENCOUNTER — Ambulatory Visit (HOSPITAL_BASED_OUTPATIENT_CLINIC_OR_DEPARTMENT_OTHER): Payer: Medicare Other

## 2017-03-21 VITALS — BP 123/62 | HR 84 | Temp 98.4°F | Resp 18 | Wt 177.5 lb

## 2017-03-21 DIAGNOSIS — C541 Malignant neoplasm of endometrium: Secondary | ICD-10-CM

## 2017-03-21 DIAGNOSIS — Z5111 Encounter for antineoplastic chemotherapy: Secondary | ICD-10-CM

## 2017-03-21 MED ORDER — DOXORUBICIN HCL CHEMO IV INJECTION 2 MG/ML
40.0000 mg/m2 | Freq: Once | INTRAVENOUS | Status: AC
  Administered 2017-03-21: 76 mg via INTRAVENOUS
  Filled 2017-03-21: qty 38

## 2017-03-21 MED ORDER — SODIUM CHLORIDE 0.9 % IV SOLN
Freq: Once | INTRAVENOUS | Status: AC
  Administered 2017-03-21: 08:00:00 via INTRAVENOUS

## 2017-03-21 MED ORDER — POTASSIUM CHLORIDE 2 MEQ/ML IV SOLN
Freq: Once | INTRAVENOUS | Status: AC
  Administered 2017-03-21: 08:00:00 via INTRAVENOUS
  Filled 2017-03-21: qty 10

## 2017-03-21 MED ORDER — HEPARIN SOD (PORK) LOCK FLUSH 100 UNIT/ML IV SOLN
500.0000 [IU] | Freq: Once | INTRAVENOUS | Status: AC | PRN
  Administered 2017-03-21: 500 [IU]
  Filled 2017-03-21: qty 5

## 2017-03-21 MED ORDER — SODIUM CHLORIDE 0.9% FLUSH
10.0000 mL | INTRAVENOUS | Status: DC | PRN
  Administered 2017-03-21: 10 mL
  Filled 2017-03-21: qty 10

## 2017-03-21 MED ORDER — PALONOSETRON HCL INJECTION 0.25 MG/5ML
0.2500 mg | Freq: Once | INTRAVENOUS | Status: AC
  Administered 2017-03-21: 0.25 mg via INTRAVENOUS

## 2017-03-21 MED ORDER — FUROSEMIDE 10 MG/ML IJ SOLN: 20.0000 mg | Freq: Once | INTRAMUSCULAR | Status: DC

## 2017-03-21 MED ORDER — SODIUM CHLORIDE 0.9 % IV SOLN
50.0000 mg/m2 | Freq: Once | INTRAVENOUS | Status: AC
  Administered 2017-03-21: 95 mg via INTRAVENOUS
  Filled 2017-03-21: qty 95

## 2017-03-21 MED ORDER — FOSAPREPITANT DIMEGLUMINE INJECTION 150 MG
Freq: Once | INTRAVENOUS | Status: AC
  Administered 2017-03-21: 12:00:00 via INTRAVENOUS
  Filled 2017-03-21: qty 5

## 2017-03-21 NOTE — Assessment & Plan Note (Signed)
She has significant liver enzymes abnormalities CT imaging showed no evidence of cancer within the liver parenchyma but she has significant peritoneal disease especially in the area just outside the liver that very well likely could be the cause of this For now, I recommend close observation and medication adjustment I would recommend her to discontinue Megace and tamoxifen

## 2017-03-21 NOTE — Assessment & Plan Note (Addendum)
She has significant anemia secondary to mild vaginal blood loss She is not symptomatic Continue close observation  She does not need blood transfusion now

## 2017-03-21 NOTE — Assessment & Plan Note (Signed)
Unfortunately, she is experiencing significant disease progression CT imaging showed heavy disease burden and the tumor marker is markedly elevated The patient overall is not doing well I recommend we proceed with second line treatment as soon as possible I reviewed with her son extensively the plan of care We reviewed the current guidelines.  Echocardiogram is within normal limits Due to scheduling issue, she will receive her first dose of treatment in the [facility] regional office I plan to see her back next week for further supportive care I recommend G-CSF support given expected risk of neutropenic fever

## 2017-03-21 NOTE — Patient Instructions (Signed)
[facility] Discharge Instructions for Patients Receiving Chemotherapy  Today you received the following chemotherapy agents Adriamycin/Cisplatin  To help prevent nausea and vomiting after your treatment, we encourage you to take your nausea medication as prescribed.    If you develop nausea and vomiting that is not controlled by your nausea medication, call the clinic.   BELOW ARE SYMPTOMS THAT SHOULD BE REPORTED IMMEDIATELY:  *FEVER GREATER THAN 100.5 F  *CHILLS WITH OR WITHOUT FEVER  NAUSEA AND VOMITING THAT IS NOT CONTROLLED WITH YOUR NAUSEA MEDICATION  *UNUSUAL SHORTNESS OF BREATH  *UNUSUAL BRUISING OR BLEEDING  TENDERNESS IN MOUTH AND THROAT WITH OR WITHOUT PRESENCE OF ULCERS  *URINARY PROBLEMS  *BOWEL PROBLEMS  UNUSUAL RASH Items with * indicate a potential emergency and should be followed up as soon as possible.  Feel free to call the clinic should you have any questions or concerns. The clinic phone number is [phone].  Please show the [facility] at check-in to the Emergency Department and triage nurse.  Doxorubicin injection (Adriamycin) What is this medicine? DOXORUBICIN (dox oh ROO bi sin) is a chemotherapy drug. It is used to treat many kinds of cancer like leukemia, lymphoma, neuroblastoma, sarcoma, and Wilms' tumor. It is also used to treat bladder cancer, breast cancer, lung cancer, ovarian cancer, stomach cancer, and thyroid cancer. This medicine may be used for other purposes; ask your health care provider or pharmacist if you have questions. COMMON BRAND NAME(S): Adriamycin, Adriamycin PFS, Adriamycin RDF, Rubex What should I tell my health care provider before I take this medicine? They need to know if you have any of these conditions: -heart disease -history of low blood counts caused by a medicine -liver disease -recent or ongoing radiation therapy -an unusual or allergic reaction to doxorubicin, other chemotherapy agents,  other medicines, foods, dyes, or preservatives -pregnant or trying to get pregnant -breast-feeding How should I use this medicine? This drug is given as an infusion into a vein. It is administered in a hospital or clinic by a specially trained health care professional. If you have pain, swelling, burning or any unusual feeling around the site of your injection, tell your health care professional right away. Talk to your pediatrician regarding the use of this medicine in children. Special care may be needed. Overdosage: If you think you have taken too much of this medicine contact a poison control center or emergency room at once. NOTE: This medicine is only for you. Do not share this medicine with others. What if I miss a dose? It is important not to miss your dose. Call your doctor or health care professional if you are unable to keep an appointment. What may interact with this medicine? This medicine may interact with the following medications: -6-mercaptopurine -paclitaxel -phenytoin -St. John's Wort -trastuzumab -verapamil This list may not describe all possible interactions. Give your health care provider a list of all the medicines, herbs, non-prescription drugs, or dietary supplements you use. Also tell them if you smoke, drink alcohol, or use illegal drugs. Some items may interact with your medicine. What should I watch for while using this medicine? This drug may make you feel generally unwell. This is not uncommon, as chemotherapy can affect healthy cells as well as cancer cells. Report any side effects. Continue your course of treatment even though you feel ill unless your doctor tells you to stop. There is a maximum amount of this medicine you should receive throughout your life. The amount depends on  the medical condition being treated and your overall health. Your doctor will watch how much of this medicine you receive in your lifetime. Tell your doctor if you have taken this  medicine before. You may need blood work done while you are taking this medicine. Your urine may turn red for a few days after your dose. This is not blood. If your urine is dark or brown, call your doctor. In some cases, you may be given additional medicines to help with side effects. Follow all directions for their use. Call your doctor or health care professional for advice if you get a fever, chills or sore throat, or other symptoms of a cold or flu. Do not treat yourself. This drug decreases your body's ability to fight infections. Try to avoid being around people who are sick. This medicine may increase your risk to bruise or bleed. Call your doctor or health care professional if you notice any unusual bleeding. Talk to your doctor about your risk of cancer. You may be more at risk for certain types of cancers if you take this medicine. Do not become pregnant while taking this medicine or for 6 months after stopping it. Women should inform their doctor if they wish to become pregnant or think they might be pregnant. Men should not father a child while taking this medicine and for 6 months after stopping it. There is a potential for serious side effects to an unborn child. Talk to your health care professional or pharmacist for more information. Do not breast-feed an infant while taking this medicine. This medicine has caused ovarian failure in some women and reduced sperm counts in some men This medicine may interfere with the ability to have a child. Talk with your doctor or health care professional if you are concerned about your fertility. What side effects may I notice from receiving this medicine? Side effects that you should report to your doctor or health care professional as soon as possible: -allergic reactions like skin rash, itching or hives, swelling of the face, lips, or tongue -breathing problems -chest pain -fast or irregular heartbeat -low blood counts - this medicine may  decrease the number of white blood cells, red blood cells and platelets. You may be at increased risk for infections and bleeding. -pain, redness, or irritation at site where injected -signs of infection - fever or chills, cough, sore throat, pain or difficulty passing urine -signs of decreased platelets or bleeding - bruising, pinpoint red spots on the skin, black, tarry stools, blood in the urine -swelling of the ankles, feet, hands -tiredness -weakness Side effects that usually do not require medical attention (report to your doctor or health care professional if they continue or are bothersome): -diarrhea -hair loss -mouth sores -nail discoloration or damage -nausea -red colored urine -vomiting This list may not describe all possible side effects. Call your doctor for medical advice about side effects. You may report side effects to FDA at 1-800-FDA-1088. Where should I keep my medicine? This drug is given in a hospital or clinic and will not be stored at home. NOTE: This sheet is a summary. It may not cover all possible information. If you have questions about this medicine, talk to your doctor, pharmacist, or health care provider.  2018 [employer] Standard (2015-06-05 11:28:51)  Cisplatin injection What is this medicine? CISPLATIN (SIS pla tin) is a chemotherapy drug. It targets fast dividing cells, like cancer cells, and causes these cells to die. This medicine is used to treat many  types of cancer like bladder, ovarian, and testicular cancers. This medicine may be used for other purposes; ask your health care provider or pharmacist if you have questions. COMMON BRAND NAME(S): Platinol, Platinol -AQ What should I tell my health care provider before I take this medicine? They need to know if you have any of these conditions: -blood disorders -hearing problems -kidney disease -recent or ongoing radiation therapy -an unusual or allergic reaction to cisplatin, carboplatin,  other chemotherapy, other medicines, foods, dyes, or preservatives -pregnant or trying to get pregnant -breast-feeding How should I use this medicine? This drug is given as an infusion into a vein. It is administered in a hospital or clinic by a specially trained health care professional. Talk to your pediatrician regarding the use of this medicine in children. Special care may be needed. Overdosage: If you think you have taken too much of this medicine contact a poison control center or emergency room at once. NOTE: This medicine is only for you. Do not share this medicine with others. What if I miss a dose? It is important not to miss a dose. Call your doctor or health care professional if you are unable to keep an appointment. What may interact with this medicine? -dofetilide -foscarnet -medicines for seizures -medicines to increase blood counts like filgrastim, pegfilgrastim, sargramostim -probenecid -pyridoxine used with altretamine -rituximab -some antibiotics like amikacin, gentamicin, neomycin, polymyxin B, streptomycin, tobramycin -sulfinpyrazone -vaccines -zalcitabine Talk to your doctor or health care professional before taking any of these medicines: -acetaminophen -aspirin -ibuprofen -ketoprofen -naproxen This list may not describe all possible interactions. Give your health care provider a list of all the medicines, herbs, non-prescription drugs, or dietary supplements you use. Also tell them if you smoke, drink alcohol, or use illegal drugs. Some items may interact with your medicine. What should I watch for while using this medicine? Your condition will be monitored carefully while you are receiving this medicine. You will need important blood work done while you are taking this medicine. This drug may make you feel generally unwell. This is not uncommon, as chemotherapy can affect healthy cells as well as cancer cells. Report any side effects. Continue your course of  treatment even though you feel ill unless your doctor tells you to stop. In some cases, you may be given additional medicines to help with side effects. Follow all directions for their use. Call your doctor or health care professional for advice if you get a fever, chills or sore throat, or other symptoms of a cold or flu. Do not treat yourself. This drug decreases your body's ability to fight infections. Try to avoid being around people who are sick. This medicine may increase your risk to bruise or bleed. Call your doctor or health care professional if you notice any unusual bleeding. Be careful brushing and flossing your teeth or using a toothpick because you may get an infection or bleed more easily. If you have any dental work done, tell your dentist you are receiving this medicine. Avoid taking products that contain aspirin, acetaminophen, ibuprofen, naproxen, or ketoprofen unless instructed by your doctor. These medicines may hide a fever. Do not become pregnant while taking this medicine. Women should inform their doctor if they wish to become pregnant or think they might be pregnant. There is a potential for serious side effects to an unborn child. Talk to your health care professional or pharmacist for more information. Do not breast-feed an infant while taking this medicine. Drink fluids as directed while  you are taking this medicine. This will help protect your kidneys. Call your doctor or health care professional if you get diarrhea. Do not treat yourself. What side effects may I notice from receiving this medicine? Side effects that you should report to your doctor or health care professional as soon as possible: -allergic reactions like skin rash, itching or hives, swelling of the face, lips, or tongue -signs of infection - fever or chills, cough, sore throat, pain or difficulty passing urine -signs of decreased platelets or bleeding - bruising, pinpoint red spots on the skin, black,  tarry stools, nosebleeds -signs of decreased red blood cells - unusually weak or tired, fainting spells, lightheadedness -breathing problems -changes in hearing -gout pain -low blood counts - This drug may decrease the number of white blood cells, red blood cells and platelets. You may be at increased risk for infections and bleeding. -nausea and vomiting -pain, swelling, redness or irritation at the injection site -pain, tingling, numbness in the hands or feet -problems with balance, movement -trouble passing urine or change in the amount of urine Side effects that usually do not require medical attention (report to your doctor or health care professional if they continue or are bothersome): -changes in vision -loss of appetite -metallic taste in the mouth or changes in taste This list may not describe all possible side effects. Call your doctor for medical advice about side effects. You may report side effects to FDA at 1-800-FDA-1088. Where should I keep my medicine? This drug is given in a hospital or clinic and will not be stored at home. NOTE: This sheet is a summary. It may not cover all possible information. If you have questions about this medicine, talk to your doctor, pharmacist, or health care provider.  2018 [employer] Standard (2007-07-14 14:40:54)

## 2017-03-21 NOTE — Progress Notes (Signed)
Dr. Alvy Bimler notified of total urine output of 300 ml for today's treatment and VS.  Order received to hold Lasix with today's treatment.

## 2017-03-21 NOTE — Progress Notes (Signed)
[facility] OFFICE PROGRESS NOTE  Patient Care Team: Avbuere, Edwin, MD as PCP - General (Internal Medicine)  SUMMARY OF ONCOLOGIC HISTORY: Oncology History   Focally ER positive MSI stable disease     Endometrial/uterine adenocarcinoma (HCC)   08/01/2016 Initial Diagnosis    She was admitted to  [facility] on 08/01/16 with postmenopausal vaginal bleeding lasting 10 days. However, she was transferred to [facility] on 08/06/16 for suicidal ideation. There, GYN was consulted and ordered biopsy. Pt was transferred to medical unit for blood transfusion.       08/10/2016 Imaging    US pelvis:  The uterus is prominent and heterogeneous measuring 10.9 x 6.3 x 6.1 cm. There is a probable anterior exophytic fundal fibroid measuring 4.5 cm. The endometrial stripe is normal in thickness at 6.4 mm. I see no fluid in the canal  Right ovary is not visible. Left ovary 2.3 x 1.7 x 1.9 cm with normal echogenicity. Color flow and spectral Doppler: Normal arterial inflow and venous outflow.  There is no significant free fluid.   I see no adnexal masses.      08/11/2016 - 08/15/2016 Hospital Admission    She was admitted voluntarily to Psychiatry unit 08/06/2016 for suicidal ideation.She was evaluated in an outside facility due to vaginal bleeding and endometrial and endocervical biopsies performed. Pathology revealed a poorly differentiated cancer with positive estrogen receptors felt to be suggestive of endometrial cancer. The patient received 2 units of prbcs total during her stay for anemia.        08/11/2016 Pathology Results       1. Endocervix, curettings and polyp. (*please see comment below). --High-grade carcinoma, undifferentiated.  2. Endometrial biopsy:(*please see comment below). --High-grade carcinoma, undifferentiated. --Weakly positive for estrogen receptors suggestive of endometrial origin; Negative for progesterone receptors and neutral  mucin..       08/14/2016 Imaging    CT imaging 1. Uterine mass and abnormality consistent with clinical history of endometrial carcinoma as described above with bilateral pelvic and retroperitoneal adenopathy consistent with nodal metastasis. 2. No evidence of nonnodal distant metastasis. 3. Nonspecific incidental finding of hypertrophy of the pylorus muscle. 4. incidental finding of cholelithiasis      09/04/2016 Imaging    CT chest: 1. 4 mm nodule in the anterior aspect of the right upper lobe. This is highly nonspecific. Although metastatic disease is not excluded, it is not strongly favored. Attention under routine followup examinations is recommended to ensure the stability or resolution of this finding. 2. Trace right pleural effusion lying dependently is simple in appearance. 3. Mild cardiomegaly.      09/05/2016 Procedure    Successful placement of a right internal jugular approach power injectable Port-A-Cath. The catheter is ready for immediate use.      09/05/2016 Tumor Marker    Patient's tumor was tested for the following markers: CA125 Results of the tumor marker test revealed 1150      09/09/2016 - 12/30/2016 Chemotherapy    She received chemotherapy with carboplatin and Taxol x 6 cycles      09/30/2016 Tumor Marker    Patient's tumor was tested for the following markers: CA125 Results of the tumor marker test revealed 338.1      10/28/2016 Tumor Marker    Patient's tumor was tested for the following markers: CA125 Results of the tumor marker test revealed 210.9      11/08/2016 Imaging    CT scan abdomen and pelvis 1. Soft   tissue nodularity throughout the omental and perihepatic fat worrisome for peritoneal carcinomatosis. No ascites. 2. No other definitive evidence of metastatic disease. There are prominent left pelvic sidewall and right inguinal lymph nodes. 3. Nonspecific findings in the uterus, likely related to treated tumor and fibroids. Prior pelvic imaging  unavailable. 4. Cholelithiasis.      12/09/2016 Tumor Marker    Patient's tumor was tested for the following markers: CA125 Results of the tumor marker test revealed 98.1      12/30/2016 Tumor Marker    Patient's tumor was tested for the following markers: CA125 Results of the tumor marker test revealed 106.1      01/07/2017 Imaging    1. Stable soft tissue nodularity along the omentum suspicious for peritoneal spread of tumor. Stable appearance of nodularity along the uterine fundus and indistinctness along the right adnexa, some of this may be due to fibroids. 2. The previously borderline enlarged left external iliac lymph node is now normal in size. 3. Stable 4 mm ill-defined right upper lobe pulmonary nodule anteriorly, this may well in depth being benign but merits surveillance. 4. Cholelithiasis.      03/03/2017 Tumor Marker    Patient's tumor was tested for the following markers: CA125 Results of the tumor marker test revealed 694.5      03/07/2017 Imaging    1. Clear interval progression of peritoneal and omental disease as above. Diffuse mesenteric thickening and nodularity has progressed. 2. New juxta diaphragmatic lymph nodes compatible with metastatic disease. 3. Cholelithiasis. 4. Uterine enlargement with probable fibroid change. Ill-defined hypovascular lesion in the lower uterine segment/cervix may represent primary malignancy although this is difficult to assess by CT.      03/18/2017 Imaging    ECHO: Normal LV size with EF 55-60%. Normal RV size and systolic function. Mild MR.       INTERVAL HISTORY: Please see below for problem oriented charting. He returns with her son for further follow-up Her vaginal bleeding is slightly less Her pain is better controlled She denies nausea, constipation or  bloating Peripheral neuropathy is stable  REVIEW OF SYSTEMS:   Constitutional: Denies fevers, chills or abnormal weight loss Eyes: Denies blurriness of  vision Ears, nose, mouth, throat, and face: Denies mucositis or sore throat Respiratory: Denies cough, dyspnea or wheezes Cardiovascular: Denies palpitation, chest discomfort or lower extremity swelling Gastrointestinal:  Denies nausea, heartburn or change in bowel habits Skin: Denies abnormal skin rashes Lymphatics: Denies new lymphadenopathy or easy bruising Neurological:Denies numbness, tingling or new weaknesses Behavioral/Psych: Mood is stable, no new changes  All other systems were reviewed with the patient and are negative.  I have reviewed the past medical history, past surgical history, social history and family history with the patient and they are unchanged from previous note.  ALLERGIES:  has No Known Allergies.  MEDICATIONS:  Current Outpatient Medications  Medication Sig Dispense Refill  . HYDROcodone-acetaminophen (NORCO) 10-325 MG tablet Take 1 tablet by mouth every 4 (four) hours as needed. 60 tablet 0  . HYDROcodone-acetaminophen (NORCO/VICODIN) 5-325 MG tablet Take 1 tablet by mouth every 6 (six) hours as needed for moderate pain. 30 tablet 0  . Omega-3 Fatty Acids (FISH OIL) 1000 MG CAPS Take 100 mg by mouth daily.     No current facility-administered medications for this visit.    Facility-Administered Medications Ordered in Other Visits  Medication Dose Route Frequency Provider Last Rate Last Dose  . furosemide (LASIX) injection 20 mg  20 mg Intravenous Once Yehudis Monceaux,  MD        PHYSICAL EXAMINATION: ECOG PERFORMANCE STATUS: 1 - Symptomatic but completely ambulatory  Vitals:   03/19/17 1154  BP: 134/68  Pulse: (!) 102  Resp: 18  Temp: 98.5 F (36.9 C)  SpO2: 97%   Filed Weights   03/19/17 1154  Weight: 170 lb 9.6 oz (77.4 kg)    GENERAL:alert, no distress and comfortable SKIN: skin color, texture, turgor are normal, no rashes or significant lesions EYES: normal, Conjunctiva are pink and non-injected, sclera clear OROPHARYNX:no exudate, no  erythema and lips, buccal mucosa, and tongue normal  NECK: supple, thyroid normal size, non-tender, without nodularity LYMPH:  no palpable lymphadenopathy in the cervical, axillary or inguinal LUNGS: clear to auscultation and percussion with normal breathing effort HEART: regular rate & rhythm and no murmurs and no lower extremity edema ABDOMEN:abdomen soft, non-tender and normal bowel sounds Musculoskeletal:no cyanosis of digits and no clubbing  NEURO: alert & oriented x 3 with fluent speech, no focal motor/sensory deficits  LABORATORY DATA:  I have reviewed the data as listed    Component Value Date/Time   NA 139 03/19/2017 1312   K 3.8 03/19/2017 1312   CL 102 02/04/2017 1200   CO2 23 03/19/2017 1312   GLUCOSE 97 03/19/2017 1312   BUN 9.2 03/19/2017 1312   CREATININE 0.9 03/19/2017 1312   CALCIUM 9.5 03/19/2017 1312   PROT 8.2 03/19/2017 1312   ALBUMIN 2.9 (L) 03/19/2017 1312   AST 75 (H) 03/19/2017 1312   ALT 85 (H) 03/19/2017 1312   ALKPHOS 111 03/19/2017 1312   BILITOT 0.59 03/19/2017 1312   GFRNONAA >60 02/04/2017 1200   GFRAA >60 02/04/2017 1200    No results found for: SPEP, UPEP  Lab Results  Component Value Date   WBC 7.8 03/19/2017   NEUTROABS 5.3 03/19/2017   HGB 10.2 (L) 03/19/2017   HCT 31.9 (L) 03/19/2017   MCV 87.8 03/19/2017   PLT 466 (H) 03/19/2017      Chemistry      Component Value Date/Time   NA 139 03/19/2017 1312   K 3.8 03/19/2017 1312   CL 102 02/04/2017 1200   CO2 23 03/19/2017 1312   BUN 9.2 03/19/2017 1312   CREATININE 0.9 03/19/2017 1312      Component Value Date/Time   CALCIUM 9.5 03/19/2017 1312   ALKPHOS 111 03/19/2017 1312   AST 75 (H) 03/19/2017 1312   ALT 85 (H) 03/19/2017 1312   BILITOT 0.59 03/19/2017 1312       RADIOGRAPHIC STUDIES: I have personally reviewed the radiological images as listed and agreed with the findings in the report. Ct Abdomen Pelvis W Contrast  Result Date: 03/08/2017 CLINICAL DATA:   Endometrial cancer EXAM: CT ABDOMEN AND PELVIS WITH CONTRAST TECHNIQUE: Multidetector CT imaging of the abdomen and pelvis was performed using the standard protocol following bolus administration of intravenous contrast. CONTRAST:  126m ISOVUE-300 IOPAMIDOL (ISOVUE-300) INJECTION 61% COMPARISON:  01/07/2017 FINDINGS: Lower chest: 10 x 21 mm anterior right juxta diaphragmatic lymph node (image 9 series 2) is new in the interval. Hepatobiliary: No focal abnormality within the liver parenchyma. Calcified gallstones again noted. No intrahepatic or extrahepatic biliary dilation. Pancreas: No focal mass lesion. No dilatation of the main duct. No intraparenchymal cyst. No peripancreatic edema. Spleen: No splenomegaly. No focal mass lesion. Adrenals/Urinary Tract: No adrenal nodule or mass. No suspicious abnormality in either kidney. No evidence for hydroureter. The urinary bladder appears normal for the degree of distention. Stomach/Bowel: Stomach is  nondistended. No gastric wall thickening. No evidence of outlet obstruction. Duodenum is normally positioned as is the ligament of Treitz. No small bowel wall thickening. No small bowel dilatation. The terminal ileum is normal. The appendix appears to be incorporated in the abnormal soft tissue. No gross colonic mass. No colonic wall thickening. No substantial diverticular change. Vascular/Lymphatic: No abdominal aortic aneurysm. No abdominal aortic atherosclerotic calcification. No Gastrohepatic or hepatoduodenal ligament lymphadenopathy no retroperitoneal lymphadenopathy. No pelvic sidewall lymphadenopathy. Reproductive: Uterus is enlarged with fibroid change and lesion in the lower uterine segment/ cervix the rare present reported neoplasm. There is no adnexal mass. Other: Omental and peritoneal disease has clearly progressed in the interval. Somewhat confluent peritoneal lesion adjacent to the inferior liver (image 28 series 2) measures 4.2 x 1.5 cm. 2.2 x 1.1 cm nodule  identified in the omentum of the hepatic flexure on image 32. More confluent omental disease at sent to the left of the midline forms an area of caking measuring about 15 by 1 cm (image 38 series 2). Nodularity is seen along the peritoneal lining of both para colic gutters. There is thickening and nodularity of the small bowel mesentery (well demonstrated image 54 series 2). Musculoskeletal: Bone windows reveal no worrisome lytic or sclerotic osseous lesions. IMPRESSION: 1. Clear interval progression of peritoneal and omental disease as above. Diffuse mesenteric thickening and nodularity has progressed. 2. New juxta diaphragmatic lymph nodes compatible with metastatic disease. 3. Cholelithiasis. 4. Uterine enlargement with probable fibroid change. Ill-defined hypovascular lesion in the lower uterine segment/cervix may represent primary malignancy although this is difficult to assess by CT. Electronically Signed   By: Misty Stanley M.D.   On: 03/08/2017 17:06    ASSESSMENT & PLAN:  Endometrial/uterine adenocarcinoma ([place]) Unfortunately, she is experiencing significant disease progression CT imaging showed heavy disease burden and the tumor marker is markedly elevated The patient overall is not doing well I recommend we proceed with second line treatment as soon as possible I reviewed with her son extensively the plan of care We reviewed the current guidelines.  Echocardiogram is within normal limits Due to scheduling issue, she will receive her first dose of treatment in the [place] regional office I plan to see her back next week for further supportive care I recommend G-CSF support given expected risk of neutropenic fever  Elevated liver enzymes She has significant liver enzymes abnormalities CT imaging showed no evidence of cancer within the liver parenchyma but she has significant peritoneal disease especially in the area just outside the liver that very well likely could be the cause of  this For now, I recommend close observation and medication adjustment I would recommend her to discontinue Megace and tamoxifen  Anemia, chronic disease She has significant anemia secondary to mild vaginal blood loss She is not symptomatic Continue close observation  She does not need blood transfusion now   Cancer associated pain She has moderate cancer pain She will continue taking hydrocodone to 10 mg every 4 hours as needed for pain I would reassess pain control on a weekly basis I warned her about risk of possible constipation   No orders of the defined types were placed in this encounter.  All questions were answered. The patient knows to call the clinic with any problems, questions or concerns. No barriers to learning was detected. I spent 15 minutes counseling the patient face to face. The total time spent in the appointment was 20 minutes and more than 50% was on counseling and review  of test results     Heath Lark, MD 03/21/2017 3:07 PM

## 2017-03-21 NOTE — Assessment & Plan Note (Signed)
She has moderate cancer pain She will continue taking hydrocodone to 10 mg every 4 hours as needed for pain I would reassess pain control on a weekly basis I warned her about risk of possible constipation

## 2017-03-24 ENCOUNTER — Ambulatory Visit (HOSPITAL_BASED_OUTPATIENT_CLINIC_OR_DEPARTMENT_OTHER): Payer: Medicare Other

## 2017-03-24 ENCOUNTER — Telehealth: Payer: Self-pay | Admitting: Hematology and Oncology

## 2017-03-24 ENCOUNTER — Other Ambulatory Visit (HOSPITAL_COMMUNITY): Payer: Self-pay

## 2017-03-24 DIAGNOSIS — C541 Malignant neoplasm of endometrium: Secondary | ICD-10-CM

## 2017-03-24 DIAGNOSIS — Z5189 Encounter for other specified aftercare: Secondary | ICD-10-CM | POA: Diagnosis not present

## 2017-03-24 MED ORDER — PEGFILGRASTIM INJECTION 6 MG/0.6ML ~~LOC~~
6.0000 mg | PREFILLED_SYRINGE | Freq: Once | SUBCUTANEOUS | Status: AC
  Administered 2017-03-24: 6 mg via SUBCUTANEOUS
  Filled 2017-03-24: qty 0.6

## 2017-03-24 NOTE — Patient Instructions (Signed)
Pegfilgrastim injection What is this medicine? PEGFILGRASTIM (PEG fil gra stim) is a long-acting granulocyte colony-stimulating factor that stimulates the growth of neutrophils, a type of white blood cell important in the body's fight against infection. It is used to reduce the incidence of fever and infection in patients with certain types of cancer who are receiving chemotherapy that affects the bone marrow, and to increase survival after being exposed to high doses of radiation. This medicine may be used for other purposes; ask your health care provider or pharmacist if you have questions. COMMON BRAND NAME(S): Neulasta What should I tell my health care provider before I take this medicine? They need to know if you have any of these conditions: -kidney disease -latex allergy -ongoing radiation therapy -sickle cell disease -skin reactions to acrylic adhesives (On-Body Injector only) -an unusual or allergic reaction to pegfilgrastim, filgrastim, other medicines, foods, dyes, or preservatives -pregnant or trying to get pregnant -breast-feeding How should I use this medicine? This medicine is for injection under the skin. If you get this medicine at home, you will be taught how to prepare and give the pre-filled syringe or how to use the On-body Injector. Refer to the patient Instructions for Use for detailed instructions. Use exactly as directed. Tell your healthcare provider immediately if you suspect that the On-body Injector may not have performed as intended or if you suspect the use of the On-body Injector resulted in a missed or partial dose. It is important that you put your used needles and syringes in a special sharps container. Do not put them in a trash can. If you do not have a sharps container, call your pharmacist or healthcare provider to get one. Talk to your pediatrician regarding the use of this medicine in children. While this drug may be prescribed for selected conditions,  precautions do apply. Overdosage: If you think you have taken too much of this medicine contact a poison control center or emergency room at once. NOTE: This medicine is only for you. Do not share this medicine with others. What if I miss a dose? It is important not to miss your dose. Call your doctor or health care professional if you miss your dose. If you miss a dose due to an On-body Injector failure or leakage, a new dose should be administered as soon as possible using a single prefilled syringe for manual use. What may interact with this medicine? Interactions have not been studied. Give your health care provider a list of all the medicines, herbs, non-prescription drugs, or dietary supplements you use. Also tell them if you smoke, drink alcohol, or use illegal drugs. Some items may interact with your medicine. This list may not describe all possible interactions. Give your health care provider a list of all the medicines, herbs, non-prescription drugs, or dietary supplements you use. Also tell them if you smoke, drink alcohol, or use illegal drugs. Some items may interact with your medicine. What should I watch for while using this medicine? You may need blood work done while you are taking this medicine. If you are going to need a MRI, CT scan, or other procedure, tell your doctor that you are using this medicine (On-Body Injector only). What side effects may I notice from receiving this medicine? Side effects that you should report to your doctor or health care professional as soon as possible: -allergic reactions like skin rash, itching or hives, swelling of the face, lips, or tongue -dizziness -fever -pain, redness, or irritation at site   where injected -pinpoint red spots on the skin -red or dark-brown urine -shortness of breath or breathing problems -stomach or side pain, or pain at the shoulder -swelling -tiredness -trouble passing urine or change in the amount of urine Side  effects that usually do not require medical attention (report to your doctor or health care professional if they continue or are bothersome): -bone pain -muscle pain This list may not describe all possible side effects. Call your doctor for medical advice about side effects. You may report side effects to FDA at 1-800-FDA-1088. Where should I keep my medicine? Keep out of the reach of children. Store pre-filled syringes in a refrigerator between 2 and 8 degrees C (36 and 46 degrees F). Do not freeze. Keep in carton to protect from light. Throw away this medicine if it is left out of the refrigerator for more than 48 hours. Throw away any unused medicine after the expiration date. NOTE: This sheet is a summary. It may not cover all possible information. If you have questions about this medicine, talk to your doctor, pharmacist, or health care provider.  2018 Elsevier/Gold Standard (2016-04-04 12:58:03)  

## 2017-03-24 NOTE — Telephone Encounter (Signed)
Scheduled appt per 11/29 sch message - left message with appt date and time and sent reminder letter in the mail with appt date and time .

## 2017-03-31 ENCOUNTER — Ambulatory Visit: Payer: Medicare Other | Admitting: Hematology and Oncology

## 2017-03-31 ENCOUNTER — Other Ambulatory Visit: Payer: Medicare Other

## 2017-04-10 ENCOUNTER — Other Ambulatory Visit: Payer: Medicare Other

## 2017-04-10 ENCOUNTER — Ambulatory Visit: Payer: Medicare Other | Admitting: Hematology and Oncology

## 2017-04-10 ENCOUNTER — Telehealth: Payer: Self-pay | Admitting: Hematology and Oncology

## 2017-04-10 ENCOUNTER — Telehealth: Payer: Self-pay

## 2017-04-10 ENCOUNTER — Other Ambulatory Visit: Payer: Self-pay | Admitting: Hematology and Oncology

## 2017-04-10 MED ORDER — HYDROCODONE-ACETAMINOPHEN 10-325 MG PO TABS: 1.0000 | ORAL_TABLET | ORAL | 0 refills | Status: DC | PRN

## 2017-04-10 MED ORDER — PROCHLORPERAZINE MALEATE 10 MG PO TABS: 10.0000 mg | ORAL_TABLET | Freq: Four times a day (QID) | ORAL | 1 refills | Status: DC | PRN

## 2017-04-10 MED ORDER — ONDANSETRON HCL 8 MG PO TABS: 8.0000 mg | ORAL_TABLET | Freq: Three times a day (TID) | ORAL | 3 refills | Status: DC | PRN

## 2017-04-10 NOTE — Telephone Encounter (Signed)
Son called and left message. Needs refill on anti-nausea med and Emla cream.

## 2017-04-10 NOTE — Telephone Encounter (Signed)
Will refill when she gets here

## 2017-04-10 NOTE — Telephone Encounter (Signed)
I spoke with the patient's son The patient is undergoing a mental health crisis and refused to come to the clinic for appointment Her son is very worried about her She is complaining of pain and nausea.  He requested medication refills for her Her son would like her appointment canceled and he will call me back once the patient is well enough to resume treatment.  I will cancel her appointments per request

## 2017-04-11 ENCOUNTER — Ambulatory Visit: Payer: Medicare Other

## 2017-04-21 ENCOUNTER — Telehealth: Payer: Self-pay | Admitting: *Deleted

## 2017-04-21 NOTE — Telephone Encounter (Signed)
Son states patient is OK to resume chemo. Please schedule.

## 2017-04-23 ENCOUNTER — Ambulatory Visit (HOSPITAL_BASED_OUTPATIENT_CLINIC_OR_DEPARTMENT_OTHER): Payer: Medicare Other

## 2017-04-23 ENCOUNTER — Ambulatory Visit (HOSPITAL_BASED_OUTPATIENT_CLINIC_OR_DEPARTMENT_OTHER): Payer: Medicare Other | Admitting: Hematology and Oncology

## 2017-04-23 ENCOUNTER — Telehealth: Payer: Self-pay | Admitting: Hematology and Oncology

## 2017-04-23 ENCOUNTER — Telehealth: Payer: Self-pay | Admitting: *Deleted

## 2017-04-23 VITALS — BP 123/78 | HR 124 | Temp 97.9°F | Resp 20 | Ht 64.0 in | Wt 158.8 lb

## 2017-04-23 DIAGNOSIS — E440 Moderate protein-calorie malnutrition: Secondary | ICD-10-CM

## 2017-04-23 DIAGNOSIS — F3163 Bipolar disorder, current episode mixed, severe, without psychotic features: Secondary | ICD-10-CM

## 2017-04-23 DIAGNOSIS — D638 Anemia in other chronic diseases classified elsewhere: Secondary | ICD-10-CM

## 2017-04-23 DIAGNOSIS — C541 Malignant neoplasm of endometrium: Secondary | ICD-10-CM | POA: Diagnosis not present

## 2017-04-23 DIAGNOSIS — G893 Neoplasm related pain (acute) (chronic): Secondary | ICD-10-CM

## 2017-04-23 LAB — CBC WITH DIFFERENTIAL/PLATELET
BASO%: 0.5 % (ref 0.0–2.0)
Basophils Absolute: 0.1 10*3/uL (ref 0.0–0.1)
EOS%: 1.5 % (ref 0.0–7.0)
Eosinophils Absolute: 0.2 10*3/uL (ref 0.0–0.5)
HCT: 28.3 % — ABNORMAL LOW (ref 34.8–46.6)
HEMOGLOBIN: 9.2 g/dL — AB (ref 11.6–15.9)
LYMPH#: 3.1 10*3/uL (ref 0.9–3.3)
LYMPH%: 23.1 % (ref 14.0–49.7)
MCH: 27.4 pg (ref 25.1–34.0)
MCHC: 32.4 g/dL (ref 31.5–36.0)
MCV: 84.6 fL (ref 79.5–101.0)
MONO#: 1.2 10*3/uL — AB (ref 0.1–0.9)
MONO%: 8.8 % (ref 0.0–14.0)
NEUT%: 66.1 % (ref 38.4–76.8)
NEUTROS ABS: 8.9 10*3/uL — AB (ref 1.5–6.5)
Platelets: 632 10*3/uL — ABNORMAL HIGH (ref 145–400)
RBC: 3.34 10*6/uL — AB (ref 3.70–5.45)
RDW: 17 % — AB (ref 11.2–14.5)
WBC: 13.4 10*3/uL — AB (ref 3.9–10.3)

## 2017-04-23 LAB — COMPREHENSIVE METABOLIC PANEL
ALK PHOS: 117 U/L (ref 40–150)
ALT: 22 U/L (ref 0–55)
ANION GAP: 12 meq/L — AB (ref 3–11)
AST: 38 U/L — ABNORMAL HIGH (ref 5–34)
Albumin: 2.6 g/dL — ABNORMAL LOW (ref 3.5–5.0)
BUN: 10.8 mg/dL (ref 7.0–26.0)
CALCIUM: 8.6 mg/dL (ref 8.4–10.4)
CO2: 27 mEq/L (ref 22–29)
Chloride: 96 mEq/L — ABNORMAL LOW (ref 98–109)
Creatinine: 0.8 mg/dL (ref 0.6–1.1)
Glucose: 98 mg/dl (ref 70–140)
POTASSIUM: 3.8 meq/L (ref 3.5–5.1)
Sodium: 135 mEq/L — ABNORMAL LOW (ref 136–145)
Total Bilirubin: 0.62 mg/dL (ref 0.20–1.20)
Total Protein: 7.5 g/dL (ref 6.4–8.3)

## 2017-04-23 NOTE — Telephone Encounter (Signed)
Gave patient AVS and calendar of upcoming January appointments °

## 2017-04-23 NOTE — Telephone Encounter (Signed)
Can you call son Emily Duarte to see if he can get her in to be seen at 215 pm today? She needs labs before Please send scheduling msg for 30 mins

## 2017-04-23 NOTE — Telephone Encounter (Signed)
Deveron Furlong will bring Mrs Spurrier in today. Message to scheduler

## 2017-04-24 ENCOUNTER — Telehealth: Payer: Self-pay

## 2017-04-24 ENCOUNTER — Ambulatory Visit: Payer: Self-pay | Admitting: Hematology and Oncology

## 2017-04-24 NOTE — Telephone Encounter (Signed)
Per pt's son, pt does not need to be seen today. This RN will cancel appt.

## 2017-04-25 ENCOUNTER — Encounter: Payer: Self-pay | Admitting: Hematology and Oncology

## 2017-04-25 NOTE — Assessment & Plan Note (Signed)
She has moderate cancer pain, currently well controlled She will continue taking hydrocodone to 10 mg every 4 hours as needed for pain I warned her about risk of possible constipation

## 2017-04-25 NOTE — Assessment & Plan Note (Signed)
The patient had nausea with recent chemotherapy but subsequently resolved with conservative management She denies further bleeding Chemotherapy was delayed and interrupted due to recent mental health issues She is ready to resume treatment I would proceed to get her reschedule to resume chemotherapy I plan to see her back within a week of treatment for further supportive care

## 2017-04-25 NOTE — Assessment & Plan Note (Signed)
The patient had recent mental health issue exacerbation, resolved. Today, her mental state appears to be stable on medications She will continue medical management

## 2017-04-25 NOTE — Progress Notes (Signed)
[facility] OFFICE PROGRESS NOTE  Patient Care Team: Avbuere, Edwin, MD as PCP - General (Internal Medicine)  SUMMARY OF ONCOLOGIC HISTORY: Oncology History   Focally ER positive MSI stable disease     Endometrial/uterine adenocarcinoma (HCC)   08/01/2016 Initial Diagnosis    She was admitted to  [facility] on 08/01/16 with postmenopausal vaginal bleeding lasting 10 days. However, she was transferred to [facility] on 08/06/16 for suicidal ideation. There, GYN was consulted and ordered biopsy. Pt was transferred to medical unit for blood transfusion.       08/10/2016 Imaging    US pelvis:  The uterus is prominent and heterogeneous measuring 10.9 x 6.3 x 6.1 cm. There is a probable anterior exophytic fundal fibroid measuring 4.5 cm. The endometrial stripe is normal in thickness at 6.4 mm. I see no fluid in the canal  Right ovary is not visible. Left ovary 2.3 x 1.7 x 1.9 cm with normal echogenicity. Color flow and spectral Doppler: Normal arterial inflow and venous outflow.  There is no significant free fluid.   I see no adnexal masses.      08/11/2016 - 08/15/2016 Hospital Admission    She was admitted voluntarily to Psychiatry unit 08/06/2016 for suicidal ideation.She was evaluated in an outside facility due to vaginal bleeding and endometrial and endocervical biopsies performed. Pathology revealed a poorly differentiated cancer with positive estrogen receptors felt to be suggestive of endometrial cancer. The patient received 2 units of prbcs total during her stay for anemia.        08/11/2016 Pathology Results       1. Endocervix, curettings and polyp. (*please see comment below). --High-grade carcinoma, undifferentiated.  2. Endometrial biopsy:(*please see comment below). --High-grade carcinoma, undifferentiated. --Weakly positive for estrogen receptors suggestive of endometrial origin; Negative for progesterone receptors and neutral  mucin..       08/14/2016 Imaging    CT imaging 1. Uterine mass and abnormality consistent with clinical history of endometrial carcinoma as described above with bilateral pelvic and retroperitoneal adenopathy consistent with nodal metastasis. 2. No evidence of nonnodal distant metastasis. 3. Nonspecific incidental finding of hypertrophy of the pylorus muscle. 4. incidental finding of cholelithiasis      09/04/2016 Imaging    CT chest: 1. 4 mm nodule in the anterior aspect of the right upper lobe. This is highly nonspecific. Although metastatic disease is not excluded, it is not strongly favored. Attention under routine followup examinations is recommended to ensure the stability or resolution of this finding. 2. Trace right pleural effusion lying dependently is simple in appearance. 3. Mild cardiomegaly.      09/05/2016 Procedure    Successful placement of a right internal jugular approach power injectable Port-A-Cath. The catheter is ready for immediate use.      09/05/2016 Tumor Marker    Patient's tumor was tested for the following markers: CA125 Results of the tumor marker test revealed 1150      09/09/2016 - 12/30/2016 Chemotherapy    She received chemotherapy with carboplatin and Taxol x 6 cycles      09/30/2016 Tumor Marker    Patient's tumor was tested for the following markers: CA125 Results of the tumor marker test revealed 338.1      10/28/2016 Tumor Marker    Patient's tumor was tested for the following markers: CA125 Results of the tumor marker test revealed 210.9      11/08/2016 Imaging    CT scan abdomen and pelvis 1. Soft   tissue nodularity throughout the omental and perihepatic fat worrisome for peritoneal carcinomatosis. No ascites. 2. No other definitive evidence of metastatic disease. There are prominent left pelvic sidewall and right inguinal lymph nodes. 3. Nonspecific findings in the uterus, likely related to treated tumor and fibroids. Prior pelvic imaging  unavailable. 4. Cholelithiasis.      12/09/2016 Tumor Marker    Patient's tumor was tested for the following markers: CA125 Results of the tumor marker test revealed 98.1      12/30/2016 Tumor Marker    Patient's tumor was tested for the following markers: CA125 Results of the tumor marker test revealed 106.1      01/07/2017 Imaging    1. Stable soft tissue nodularity along the omentum suspicious for peritoneal spread of tumor. Stable appearance of nodularity along the uterine fundus and indistinctness along the right adnexa, some of this may be due to fibroids. 2. The previously borderline enlarged left external iliac lymph node is now normal in size. 3. Stable 4 mm ill-defined right upper lobe pulmonary nodule anteriorly, this may well in depth being benign but merits surveillance. 4. Cholelithiasis.      03/03/2017 Tumor Marker    Patient's tumor was tested for the following markers: CA125 Results of the tumor marker test revealed 694.5      03/07/2017 Imaging    1. Clear interval progression of peritoneal and omental disease as above. Diffuse mesenteric thickening and nodularity has progressed. 2. New juxta diaphragmatic lymph nodes compatible with metastatic disease. 3. Cholelithiasis. 4. Uterine enlargement with probable fibroid change. Ill-defined hypovascular lesion in the lower uterine segment/cervix may represent primary malignancy although this is difficult to assess by CT.      03/18/2017 Imaging    ECHO: Normal LV size with EF 55-60%. Normal RV size and systolic function. Mild MR.       INTERVAL HISTORY: Please see below for problem oriented charting. She returns with her son for further follow-up She had recent mental health issue and did not want to pursue treatment. She felt better now and is willing to resume treatment has further vaginal bleeding Her appetite is stable but she has lost some weight She had recent nausea but resolved with antiemetics Her  pain is well controlled.  She denies recent worsening peripheral neuropathy  REVIEW OF SYSTEMS:   Constitutional: Denies fevers, chills or abnormal weight loss Eyes: Denies blurriness of vision Ears, nose, mouth, throat, and face: Denies mucositis or sore throat Respiratory: Denies cough, dyspnea or wheezes Cardiovascular: Denies palpitation, chest discomfort or lower extremity swelling Skin: Denies abnormal skin rashes Lymphatics: Denies new lymphadenopathy or easy bruising Neurological:Denies numbness, tingling or new weaknesses Behavioral/Psych: Mood is stable, no new changes  All other systems were reviewed with the patient and are negative.  I have reviewed the past medical history, past surgical history, social history and family history with the patient and they are unchanged from previous note.  ALLERGIES:  has No Known Allergies.  MEDICATIONS:  Current Outpatient Medications  Medication Sig Dispense Refill  . HYDROcodone-acetaminophen (NORCO) 10-325 MG tablet Take 1 tablet by mouth every 4 (four) hours as needed. 60 tablet 0  . HYDROcodone-acetaminophen (NORCO/VICODIN) 5-325 MG tablet Take 1 tablet by mouth every 6 (six) hours as needed for moderate pain. 30 tablet 0  . Omega-3 Fatty Acids (FISH OIL) 1000 MG CAPS Take 100 mg by mouth daily.    . ondansetron (ZOFRAN) 8 MG tablet Take 1 tablet (8 mg total) by mouth every 8 (  eight) hours as needed for nausea. 30 tablet 3  . prochlorperazine (COMPAZINE) 10 MG tablet Take 1 tablet (10 mg total) by mouth every 6 (six) hours as needed for nausea or vomiting. 30 tablet 1   No current facility-administered medications for this visit.     PHYSICAL EXAMINATION: ECOG PERFORMANCE STATUS: 1 - Symptomatic but completely ambulatory  Vitals:   04/23/17 1411  BP: 123/78  Pulse: (!) 124  Resp: 20  Temp: 97.9 F (36.6 C)  SpO2: 100%   Filed Weights   04/23/17 1411  Weight: 158 lb 12.8 oz (72 kg)    GENERAL:alert, no distress and  comfortable SKIN: skin color, texture, turgor are normal, no rashes or significant lesions EYES: normal, Conjunctiva are pink and non-injected, sclera clear OROPHARYNX:no exudate, no erythema and lips, buccal mucosa, and tongue normal  NECK: supple, thyroid normal size, non-tender, without nodularity LYMPH:  no palpable lymphadenopathy in the cervical, axillary or inguinal LUNGS: clear to auscultation and percussion with normal breathing effort HEART: regular rate & rhythm and no murmurs and no lower extremity edema ABDOMEN:abdomen soft, non-tender and normal bowel sounds Musculoskeletal:no cyanosis of digits and no clubbing  NEURO: alert & oriented x 3 with fluent speech, no focal motor/sensory deficits  LABORATORY DATA:  I have reviewed the data as listed    Component Value Date/Time   NA 135 (L) 04/23/2017 1359   K 3.8 04/23/2017 1359   CL 102 02/04/2017 1200   CO2 27 04/23/2017 1359   GLUCOSE 98 04/23/2017 1359   BUN 10.8 04/23/2017 1359   CREATININE 0.8 04/23/2017 1359   CALCIUM 8.6 04/23/2017 1359   PROT 7.5 04/23/2017 1359   ALBUMIN 2.6 (L) 04/23/2017 1359   AST 38 (H) 04/23/2017 1359   ALT 22 04/23/2017 1359   ALKPHOS 117 04/23/2017 1359   BILITOT 0.62 04/23/2017 1359   GFRNONAA >60 02/04/2017 1200   GFRAA >60 02/04/2017 1200    No results found for: SPEP, UPEP  Lab Results  Component Value Date   WBC 13.4 (H) 04/23/2017   NEUTROABS 8.9 (H) 04/23/2017   HGB 9.2 (L) 04/23/2017   HCT 28.3 (L) 04/23/2017   MCV 84.6 04/23/2017   PLT 632 (H) 04/23/2017      Chemistry      Component Value Date/Time   NA 135 (L) 04/23/2017 1359   K 3.8 04/23/2017 1359   CL 102 02/04/2017 1200   CO2 27 04/23/2017 1359   BUN 10.8 04/23/2017 1359   CREATININE 0.8 04/23/2017 1359      Component Value Date/Time   CALCIUM 8.6 04/23/2017 1359   ALKPHOS 117 04/23/2017 1359   AST 38 (H) 04/23/2017 1359   ALT 22 04/23/2017 1359   BILITOT 0.62 04/23/2017 1359       ASSESSMENT &  PLAN:  Endometrial/uterine adenocarcinoma (HCC) The patient had nausea with recent chemotherapy but subsequently resolved with conservative management She denies further bleeding Chemotherapy was delayed and interrupted due to recent mental health issues She is ready to resume treatment I would proceed to get her reschedule to resume chemotherapy I plan to see her back within a week of treatment for further supportive care  Anemia, chronic disease She has significant anemia secondary to mild vaginal blood loss She is not symptomatic Continue close observation  She does not need blood transfusion now   Bipolar 1 disorder, mixed, severe (HCC) The patient had recent mental health issue exacerbation, resolved. Today, her mental state appears to be stable on medications   She will continue medical management  Cancer associated pain She has moderate cancer pain, currently well controlled She will continue taking hydrocodone to 10 mg every 4 hours as needed for pain I warned her about risk of possible constipation  Protein-calorie malnutrition, moderate (HCC) She has significant protein calorie malnutrition and poor oral intake I encouraged her to increase oral intake as tolerated    Orders Placed This Encounter  Procedures  . CA 125    Standing Status:   Standing    Number of Occurrences:   9    Standing Expiration Date:   04/25/2018  . Hold Tube, Blood Bank    Standing Status:   Standing    Number of Occurrences:   9    Standing Expiration Date:   04/23/2018   All questions were answered. The patient knows to call the clinic with any problems, questions or concerns. No barriers to learning was detected. I spent 25 minutes counseling the patient face to face. The total time spent in the appointment was 30 minutes and more than 50% was on counseling and review of test results      , MD 04/25/2017 10:04 AM  

## 2017-04-25 NOTE — Assessment & Plan Note (Signed)
She has significant protein calorie malnutrition and poor oral intake I encouraged her to increase oral intake as tolerated

## 2017-04-25 NOTE — Assessment & Plan Note (Signed)
She has significant anemia secondary to mild vaginal blood loss She is not symptomatic Continue close observation  She does not need blood transfusion now

## 2017-05-02 ENCOUNTER — Emergency Department (HOSPITAL_COMMUNITY)
Admission: EM | Admit: 2017-05-02 | Discharge: 2017-05-05 | Disposition: A | Discharge status: Other Acute Inpatient Hospital | Payer: Medicare Other | Attending: Emergency Medicine | Admitting: Emergency Medicine

## 2017-05-02 ENCOUNTER — Other Ambulatory Visit: Payer: Self-pay | Admitting: Hematology and Oncology

## 2017-05-02 ENCOUNTER — Encounter (HOSPITAL_COMMUNITY): Payer: Self-pay | Admitting: Nurse Practitioner

## 2017-05-02 ENCOUNTER — Emergency Department (HOSPITAL_COMMUNITY): Payer: Medicare Other

## 2017-05-02 ENCOUNTER — Inpatient Hospital Stay: Payer: Medicare Other | Attending: Hematology and Oncology

## 2017-05-02 VITALS — BP 131/78 | HR 115 | Temp 97.8°F | Resp 20

## 2017-05-02 DIAGNOSIS — Y929 Unspecified place or not applicable: Secondary | ICD-10-CM | POA: Diagnosis not present

## 2017-05-02 DIAGNOSIS — Z111 Encounter for screening for respiratory tuberculosis: Secondary | ICD-10-CM | POA: Diagnosis present

## 2017-05-02 DIAGNOSIS — Y999 Unspecified external cause status: Secondary | ICD-10-CM | POA: Insufficient documentation

## 2017-05-02 DIAGNOSIS — Z5112 Encounter for antineoplastic immunotherapy: Secondary | ICD-10-CM | POA: Insufficient documentation

## 2017-05-02 DIAGNOSIS — X500XXA Overexertion from strenuous movement or load, initial encounter: Secondary | ICD-10-CM | POA: Insufficient documentation

## 2017-05-02 DIAGNOSIS — S82892A Other fracture of left lower leg, initial encounter for closed fracture: Secondary | ICD-10-CM | POA: Diagnosis not present

## 2017-05-02 DIAGNOSIS — R410 Disorientation, unspecified: Secondary | ICD-10-CM | POA: Diagnosis not present

## 2017-05-02 DIAGNOSIS — Y9301 Activity, walking, marching and hiking: Secondary | ICD-10-CM | POA: Insufficient documentation

## 2017-05-02 DIAGNOSIS — Z79899 Other long term (current) drug therapy: Secondary | ICD-10-CM | POA: Diagnosis not present

## 2017-05-02 DIAGNOSIS — F22 Delusional disorders: Secondary | ICD-10-CM

## 2017-05-02 DIAGNOSIS — C541 Malignant neoplasm of endometrium: Secondary | ICD-10-CM | POA: Insufficient documentation

## 2017-05-02 DIAGNOSIS — I10 Essential (primary) hypertension: Secondary | ICD-10-CM | POA: Diagnosis not present

## 2017-05-02 DIAGNOSIS — Z5111 Encounter for antineoplastic chemotherapy: Secondary | ICD-10-CM | POA: Diagnosis not present

## 2017-05-02 DIAGNOSIS — F251 Schizoaffective disorder, depressive type: Secondary | ICD-10-CM | POA: Diagnosis not present

## 2017-05-02 DIAGNOSIS — S99912A Unspecified injury of left ankle, initial encounter: Secondary | ICD-10-CM | POA: Diagnosis present

## 2017-05-02 DIAGNOSIS — F200 Paranoid schizophrenia: Secondary | ICD-10-CM | POA: Insufficient documentation

## 2017-05-02 DIAGNOSIS — R413 Other amnesia: Secondary | ICD-10-CM | POA: Diagnosis not present

## 2017-05-02 DIAGNOSIS — Z008 Encounter for other general examination: Secondary | ICD-10-CM

## 2017-05-02 DIAGNOSIS — Z818 Family history of other mental and behavioral disorders: Secondary | ICD-10-CM | POA: Diagnosis not present

## 2017-05-02 MED ORDER — SODIUM CHLORIDE 0.9% FLUSH
10.0000 mL | INTRAVENOUS | Status: DC | PRN
  Administered 2017-05-02: 10 mL
  Filled 2017-05-02: qty 10

## 2017-05-02 MED ORDER — SODIUM CHLORIDE 0.9 % IV SOLN
Freq: Once | INTRAVENOUS | Status: AC
  Administered 2017-05-02: 14:00:00 via INTRAVENOUS
  Filled 2017-05-02: qty 5

## 2017-05-02 MED ORDER — SODIUM CHLORIDE 0.9 % IV SOLN
Freq: Once | INTRAVENOUS | Status: AC
  Administered 2017-05-02: 13:00:00 via INTRAVENOUS

## 2017-05-02 MED ORDER — PALONOSETRON HCL INJECTION 0.25 MG/5ML
INTRAVENOUS | Status: AC
  Filled 2017-05-02: qty 5

## 2017-05-02 MED ORDER — SODIUM CHLORIDE 0.9 % IV SOLN
50.0000 mg/m2 | Freq: Once | INTRAVENOUS | Status: AC
  Administered 2017-05-02: 95 mg via INTRAVENOUS
  Filled 2017-05-02: qty 95

## 2017-05-02 MED ORDER — POTASSIUM CHLORIDE 2 MEQ/ML IV SOLN
Freq: Once | INTRAVENOUS | Status: AC
  Administered 2017-05-02: 10:00:00 via INTRAVENOUS
  Filled 2017-05-02: qty 10

## 2017-05-02 MED ORDER — PALONOSETRON HCL INJECTION 0.25 MG/5ML
0.2500 mg | Freq: Once | INTRAVENOUS | Status: AC
  Administered 2017-05-02: 0.25 mg via INTRAVENOUS

## 2017-05-02 MED ORDER — HEPARIN SOD (PORK) LOCK FLUSH 100 UNIT/ML IV SOLN
500.0000 [IU] | Freq: Once | INTRAVENOUS | Status: AC | PRN
  Administered 2017-05-02: 500 [IU]
  Filled 2017-05-02: qty 5

## 2017-05-02 MED ORDER — DOXORUBICIN HCL CHEMO IV INJECTION 2 MG/ML
40.0000 mg/m2 | Freq: Once | INTRAVENOUS | Status: AC
Start: 2017-05-02 — End: 2017-05-02
  Administered 2017-05-02: 76 mg via INTRAVENOUS
  Filled 2017-05-02: qty 38

## 2017-05-02 NOTE — ED Triage Notes (Signed)
Pt is c/o left ankle pain that she reports she slipped and "the ankle couldn't hold up." Pt's son also POA is also requesting a mental evaluation for reported auditory hallucination.

## 2017-05-02 NOTE — Progress Notes (Signed)
Per Dr. Alvy Bimler, ok to treat with elevated heart rate today (115 bpm).   Per Md Alvy Bimler ok to release cisplatin/adriamycin/premeds today with urine output of 100 as long as pt is continually encouraged to drink.  Pt has received 500 ml hydration and has drank several glasses of water.  Pt verbalized understanding.  Pharmacy notified.

## 2017-05-03 ENCOUNTER — Emergency Department (HOSPITAL_COMMUNITY): Payer: Medicare Other

## 2017-05-03 LAB — RAPID URINE DRUG SCREEN, HOSP PERFORMED
AMPHETAMINES: NOT DETECTED
BARBITURATES: NOT DETECTED
Benzodiazepines: NOT DETECTED
COCAINE: NOT DETECTED
OPIATES: POSITIVE — AB
TETRAHYDROCANNABINOL: NOT DETECTED

## 2017-05-03 LAB — CBC WITH DIFFERENTIAL/PLATELET
BASOS ABS: 0 10*3/uL (ref 0.0–0.1)
Basophils Relative: 0 %
EOS PCT: 0 %
Eosinophils Absolute: 0 10*3/uL (ref 0.0–0.7)
HCT: 25.3 % — ABNORMAL LOW (ref 36.0–46.0)
Hemoglobin: 8.2 g/dL — ABNORMAL LOW (ref 12.0–15.0)
LYMPHS ABS: 0.6 10*3/uL — AB (ref 0.7–4.0)
LYMPHS PCT: 5 %
MCH: 28.5 pg (ref 26.0–34.0)
MCHC: 32.4 g/dL (ref 30.0–36.0)
MCV: 87.8 fL (ref 78.0–100.0)
MONO ABS: 0.4 10*3/uL (ref 0.1–1.0)
MONOS PCT: 3 %
NEUTROS ABS: 11.5 10*3/uL — AB (ref 1.7–7.7)
Neutrophils Relative %: 92 %
PLATELETS: 502 10*3/uL — AB (ref 150–400)
RBC: 2.88 MIL/uL — ABNORMAL LOW (ref 3.87–5.11)
RDW: 16.9 % — AB (ref 11.5–15.5)
WBC: 12.5 10*3/uL — ABNORMAL HIGH (ref 4.0–10.5)

## 2017-05-03 LAB — AMMONIA: Ammonia: 10 umol/L (ref 9–35)

## 2017-05-03 LAB — URINALYSIS, ROUTINE W REFLEX MICROSCOPIC
Bilirubin Urine: NEGATIVE
Glucose, UA: 50 mg/dL — AB
HGB URINE DIPSTICK: NEGATIVE
Ketones, ur: NEGATIVE mg/dL
Leukocytes, UA: NEGATIVE
NITRITE: NEGATIVE
Protein, ur: NEGATIVE mg/dL
SPECIFIC GRAVITY, URINE: 1.025 (ref 1.005–1.030)
pH: 5 (ref 5.0–8.0)

## 2017-05-03 LAB — COMPREHENSIVE METABOLIC PANEL
ALBUMIN: 2.4 g/dL — AB (ref 3.5–5.0)
ALT: 18 U/L (ref 14–54)
ANION GAP: 8 (ref 5–15)
AST: 28 U/L (ref 15–41)
Alkaline Phosphatase: 109 U/L (ref 38–126)
BILIRUBIN TOTAL: 0.9 mg/dL (ref 0.3–1.2)
BUN: 13 mg/dL (ref 6–20)
CHLORIDE: 98 mmol/L — AB (ref 101–111)
CO2: 25 mmol/L (ref 22–32)
Calcium: 7.8 mg/dL — ABNORMAL LOW (ref 8.9–10.3)
Creatinine, Ser: 0.74 mg/dL (ref 0.44–1.00)
GFR calc Af Amer: >60 mL/min (ref >60)
GFR calc non Af Amer: >60 mL/min (ref >60)
GLUCOSE: 151 mg/dL — AB (ref 65–99)
POTASSIUM: 3.8 mmol/L (ref 3.5–5.1)
SODIUM: 131 mmol/L — AB (ref 135–145)
TOTAL PROTEIN: 6.9 g/dL (ref 6.5–8.1)

## 2017-05-03 LAB — ACETAMINOPHEN LEVEL: Acetaminophen (Tylenol), Serum: <10 ug/mL — ABNORMAL LOW (ref 10–30)

## 2017-05-03 LAB — SALICYLATE LEVEL: Salicylate Lvl: <7 mg/dL (ref 2.8–30.0)

## 2017-05-03 MED ORDER — HYDROCODONE-ACETAMINOPHEN 10-325 MG PO TABS
1.0000 | ORAL_TABLET | ORAL | Status: DC | PRN
Start: 2017-05-03 — End: 2017-05-03

## 2017-05-03 MED ORDER — PROCHLORPERAZINE MALEATE 10 MG PO TABS
10.0000 mg | ORAL_TABLET | Freq: Four times a day (QID) | ORAL | Status: DC | PRN
  Filled 2017-05-03: qty 1

## 2017-05-03 MED ORDER — RISPERIDONE 0.5 MG PO TABS
0.5000 mg | ORAL_TABLET | Freq: Two times a day (BID) | ORAL | Status: DC
  Administered 2017-05-03 – 2017-05-05 (×5): 0.5 mg via ORAL
  Filled 2017-05-03 (×5): qty 1

## 2017-05-03 MED ORDER — HYDROCODONE-ACETAMINOPHEN 10-325 MG PO TABS
1.0000 | ORAL_TABLET | ORAL | Status: DC | PRN
  Administered 2017-05-04 – 2017-05-05 (×2): 1 via ORAL
  Filled 2017-05-03 (×2): qty 1

## 2017-05-03 MED ORDER — GABAPENTIN 100 MG PO CAPS
200.0000 mg | ORAL_CAPSULE | Freq: Two times a day (BID) | ORAL | Status: DC
  Administered 2017-05-03 – 2017-05-05 (×5): 200 mg via ORAL
  Filled 2017-05-03 (×6): qty 2

## 2017-05-03 NOTE — ED Notes (Signed)
1 bag pt belongings placed in locker 33. Pt denies any valuables to declare.

## 2017-05-03 NOTE — ED Notes (Signed)
Dinner tray given to pt

## 2017-05-03 NOTE — ED Notes (Signed)
Bed: EH20 Expected date: 04/28/17 Expected time:  Means of arrival:  Comments: Rm 15

## 2017-05-03 NOTE — ED Notes (Signed)
Pt is asleep.  Pt's son has called and been updated that pt is sleeping and will be staying at least through the night.

## 2017-05-03 NOTE — ED Provider Notes (Signed)
[place] DEPT Provider Note   CSN: [id number] Arrival date & time: 05/02/17  2011     History   Chief Complaint Chief Complaint  Patient presents with  . Ankle Pain  . Psychiatric Evaluation    HPI Emily Duarte is a 67 y.o. female actively being treated for endometrial cancer with a history of paranoid schizophrenia, who presents today for evaluation of ankle pain and hallucinations.  Reports that today she was walking when she rolled her ankle on the porch and since then has not been able to bear weight on her left ankle secondary to pain.  Reports that this is an isolated injury, did not strike her head, denies other injuries.  Her son, who is her power of attorney, is requesting a mental evaluation.  He reports that over the past few year she has had a few cognitive issues, however over the past week she has had significantly worsened delusions, and he describes it as she is not living in the same world that we are.  No fevers or chills.  She denies SI or HI.  She reports seeing snakes that can shrink and grow on her porch and that they occasionally bite her.  HPI  Past Medical History:  Diagnosis Date  . Endometrial cancer ([place])   . Hemorrhoid   . Hypertension   . Paranoid schizophrenia ([place])   . Snake bite     Patient Active Problem List   Diagnosis Date Noted  . Goals of care, counseling/discussion 03/12/2017  . Cancer associated pain 03/12/2017  . Protein-calorie malnutrition, moderate ([place]) 03/03/2017  . Schizoaffective disorder, depressive type ([place]) 02/05/2017  . 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 Elevated liver enzymes 09/06/2016  . Anemia, chronic disease 09/06/2016  . Endometrial/uterine adenocarcinoma ([place]) 08/19/2016  . Bipolar 1 disorder, mixed, severe ([place]) 08/07/2012  . Cyst of soft tissue 08/07/2012    Past Surgical History:  Procedure Laterality Date  . ENDOMETRIAL BIOPSY   08/11/2016  . IR FLUORO GUIDE PORT INSERTION RIGHT  09/05/2016  . IR US GUIDE VASC ACCESS RIGHT  09/05/2016  . TUBAL LIGATION      OB History    Gravida Para Term Preterm AB Living   1 1       1    SAB TAB Ectopic Multiple Live Births           1       Home Medications    Prior to Admission medications   Medication Sig Start Date End Date Taking? Authorizing Provider  divalproex (DEPAKOTE ER) 500 MG 24 hr tablet Take 500 mg by mouth at bedtime.   Yes [provider]  HYDROcodone-acetaminophen (NORCO) 10-325 MG tablet Take 1 tablet by mouth every 4 (four) hours as needed. Patient taking differently: Take 1 tablet by mouth every 4 (four) hours as needed for moderate pain or severe pain.  04/10/17  Yes Gorsuch, Ni, MD  ondansetron (ZOFRAN) 8 MG tablet Take 1 tablet (8 mg total) by mouth every 8 (eight) hours as needed for nausea. 04/10/17  Yes Heath Lark, MD  prochlorperazine (COMPAZINE) 10 MG tablet Take 1 tablet (10 mg total) by mouth every 6 (six) hours as needed for nausea or vomiting. 04/10/17  Yes Alvy Bimler, Ni, MD  HYDROcodone-acetaminophen (NORCO/VICODIN) 5-325 MG tablet Take 1 tablet by mouth every 6 (six) hours as needed for moderate pain. Patient not taking: Reported on 05/02/2017 02/18/17   Heath Lark, MD    Family  History Family History  Problem Relation Age of Onset  . Colon cancer Mother        late 11s  . Schizophrenia Sister   . Colon cancer Sister        late 62 s    Social History Social History   Tobacco Use  . Smoking status: Never Smoker  . Smokeless tobacco: Never Used  Substance Use Topics  . Alcohol use: No  . Drug use: No     Allergies   Patient has no known allergies.   Review of Systems Review of Systems  Constitutional: Negative for chills and fever.  HENT: Negative for ear pain and sore throat.   Eyes: Negative for pain and visual disturbance.  Respiratory: Negative for cough and shortness of breath.   Cardiovascular:  Negative for chest pain and palpitations.  Gastrointestinal: Negative for abdominal pain and vomiting.  Genitourinary: Negative for dysuria and hematuria.  Musculoskeletal: Positive for arthralgias. Negative for back pain.  Skin: Negative for color change and rash.  Neurological: Negative for seizures, syncope and headaches.  Psychiatric/Behavioral: Positive for confusion and hallucinations.     Physical Exam Updated Vital Signs BP 122/65   Pulse 65   Temp 97.6 F (36.4 C) (Oral)   Resp 16   SpO2 96%   Physical Exam  Constitutional: She appears well-developed and well-nourished. No distress.  HENT:  Head: Normocephalic and atraumatic.  Eyes: Conjunctivae are normal. Right eye exhibits no discharge. Left eye exhibits no discharge. No scleral icterus.  Neck: Normal range of motion.  Cardiovascular: Normal rate and regular rhythm.   2+ DP/PT pulses bilaterally.   Pulmonary/Chest: Effort normal. No stridor. No respiratory distress.  Abdominal: She exhibits no distension.  Musculoskeletal: She exhibits no edema or deformity.  Left ankle has obvious swelling, tender to palpation along medial and posterior aspect.  There is no proximal tenderness.  No obvious crepitus or deformity.  Neurological: She is alert. She exhibits normal muscle tone.  Skin: Skin is warm and dry. She is not diaphoretic.  Psychiatric: She has a normal mood and affect. Her speech is tangential. She is slowed. Thought content is delusional. Cognition and memory are impaired. She expresses no homicidal and no suicidal ideation. She expresses no suicidal plans and no homicidal plans.  Alert and oriented to person, place and time.  She is inattentive.  Nursing note and vitals reviewed.    ED Treatments / Results  Labs (all labs ordered are listed, but only abnormal results are displayed) Labs Reviewed  COMPREHENSIVE METABOLIC PANEL - Abnormal; Notable for the following components:      Result Value   Sodium  131 (*)    Chloride 98 (*)    Glucose, Bld 151 (*)    Calcium 7.8 (*)    Albumin 2.4 (*)    All other components within normal limits  CBC WITH DIFFERENTIAL/PLATELET - Abnormal; Notable for the following components:   WBC 12.5 (*)    RBC 2.88 (*)    Hemoglobin 8.2 (*)    HCT 25.3 (*)    RDW 16.9 (*)    Platelets 502 (*)    Neutro Abs 11.5 (*)    Lymphs Abs 0.6 (*)    All other components within normal limits  ACETAMINOPHEN LEVEL - Abnormal; Notable for the following components:   Acetaminophen (Tylenol), Serum <10 (*)    All other components within normal limits  URINALYSIS, ROUTINE W REFLEX MICROSCOPIC - Abnormal; Notable for the following components:  Color, Urine AMBER (*)    APPearance HAZY (*)    Glucose, UA 50 (*)    All other components within normal limits  RAPID URINE DRUG SCREEN, HOSP PERFORMED - Abnormal; Notable for the following components:   Opiates POSITIVE (*)    All other components within normal limits  URINE CULTURE  SALICYLATE LEVEL  AMMONIA    EKG  EKG Interpretation None       Radiology Dg Ankle Complete Left  Result Date: 05/02/2017 CLINICAL DATA:  Twisted left ankle with left ankle pain. EXAM: LEFT ANKLE COMPLETE - 3+ VIEW COMPARISON:  None. FINDINGS: There is minimal cortical discontinuity in the posterior tibia best seen on the lateral view suspicious for fracture. Generalized soft tissue swelling is noted around the ankle. There is no dislocation. IMPRESSION: Minimal cortical discontinuity in the posterior tibia best seen on lateral view suspicious for fracture. Generalized soft tissue swelling. Electronically Signed   By: Abelardo Diesel M.D.   On: 05/02/2017 21:01   Ct Head Wo Contrast  Result Date: 05/03/2017 CLINICAL DATA:  67 year old female with auditory hallucination. EXAM: CT HEAD WITHOUT CONTRAST TECHNIQUE: Contiguous axial images were obtained from the base of the skull through the vertex without intravenous contrast. COMPARISON:   None. FINDINGS: Brain: The ventricles and sulci appropriate size for patient's age. Mild periventricular and deep white matter chronic microvascular ischemic changes noted. There is no acute intracranial hemorrhage. No mass effect or midline shift. No extra-axial fluid collection. Vascular: No hyperdense vessel or unexpected calcification. Skull: Normal. Negative for fracture or focal lesion. Sinuses/Orbits: No acute finding. Other: None IMPRESSION: 1. No acute intracranial pathology. 2. Mild chronic microvascular ischemic changes. Electronically Signed   By: Anner Crete M.D.   On: 05/03/2017 01:56    Procedures Procedures (including critical care time)  Medications Ordered in ED Medications  HYDROcodone-acetaminophen (NORCO) 10-325 MG per tablet 1 tablet (not administered)  prochlorperazine (COMPAZINE) tablet 10 mg (not administered)     Initial Impression / Assessment and Plan / ED Course  I have reviewed the triage vital signs and the nursing notes.  Pertinent labs & imaging results that were available during my care of the patient were reviewed by me and considered in my medical decision making (see chart for details).  Clinical Course as of May 03 852  Sat May 03, 2017  1610 TTS consult in process.   [EH]  [id number] TTS reports impatient placement needed.   [EH]    Clinical Course User Index [EH] Lorin Glass, PA-C   Patient presents today for evaluation of ankle pain and psychiatric evaluation. 1.  Ankle pain after slip and fall, reportedly an isolated injury.  X-rays consistent with subtle fracture on posterior aspect of tibia with cortical disruption.  Patient was placed in a cam boot.  Orthopedic follow-up.  Ideally patient would be nonweightbearing on this extremity, however given psychiatric condition expect that that may not be feasible.  Orders placed for walker.  2.  Psychiatric evaluation: Patient has history of schizophrenia, and her son who is her power of  attorney requests a psychiatric evaluation.  Patient has delusions, and possible hallucinations.  Given her age, and that she is actively being treated with chemotherapy for endometrial cancer workup was performed to rule out medical causes.  Labs were obtained and appear consistent with her baseline.  CT head was performed without obvious masses or acute abnormalities.  UDS positive for opioids, consistent with previously prescribed narcotic pain medicine for her cancer.  UA not consistent with infection.  No medical cause for patient's condition found.  Holding orders were placed.  Patient was evaluated by TTS who recommended inpatient treatment.  Patient medically clear for psychiatric disposition.   Final Clinical Impressions(s) / ED Diagnoses   Final diagnoses:  Medical clearance for psychiatric admission  Closed fracture of left ankle, initial encounter  Delusions [facility])    ED Discharge Orders    None       Lorin Glass, PA-C 05/03/17 1364    Ezequiel Essex, MD 05/03/17 [id number]

## 2017-05-03 NOTE — ED Notes (Signed)
Patient is resting comfortably. 
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
## 2017-05-03 NOTE — ED Notes (Signed)
Pt resting. NAD noted.

## 2017-05-03 NOTE — Progress Notes (Signed)
Per Corena Pilgrim, MD, this pt requires psychiatric hospitalization at this time.   Patient information faxed to:  [place], [place] Clinical Social Worker [phone]

## 2017-05-03 NOTE — ED Notes (Signed)
Psychiatry at bedside.

## 2017-05-04 DIAGNOSIS — R413 Other amnesia: Secondary | ICD-10-CM

## 2017-05-04 DIAGNOSIS — Z818 Family history of other mental and behavioral disorders: Secondary | ICD-10-CM | POA: Diagnosis not present

## 2017-05-04 DIAGNOSIS — F251 Schizoaffective disorder, depressive type: Secondary | ICD-10-CM

## 2017-05-04 LAB — URINE CULTURE

## 2017-05-04 NOTE — ED Notes (Signed)
Patient is resting comfortably. 

## 2017-05-04 NOTE — Progress Notes (Signed)
Patient continues to meet inpatient criteria  Patient information faxed to:  [place] : spoke with [facility]aryl Pina currently no beds, but will be looking at new referrals tomorrow 05/05/17 [place]: At capacity  LCSW sent additional referrals to [facility], [facility], and [facility].  Will follow up on referrals.  Lane Hacker, MSW Clinical Social Work: [employer]

## 2017-05-04 NOTE — Consult Note (Signed)
Emily Duarte   Reason for Duarte:  Delusional,psychotic Referring Physician:  EDP Patient Identification: Emily Duarte MRN:  [id number] Principal Diagnosis: Schizoaffective disorder, depressive type [facility]) Diagnosis:   Patient Active Problem List   Diagnosis Date Noted  . Schizoaffective disorder, depressive type ([place]) [F25.1] 02/05/2017    Priority: High  . Goals of care, counseling/discussion [Z71.89] 03/12/2017  . 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 Protein-calorie malnutrition, moderate ([place]) [E44.0] 03/03/2017  . 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 Elevated liver enzymes [R74.8] 09/06/2016  . Anemia, chronic disease [D63.8] 09/06/2016  . 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 Bipolar 1 disorder, mixed, severe ([place]) [F31.63] 08/07/2012  . Cyst of soft tissue [M79.89] 08/07/2012    Total Time spent with patient: 45 minutes  Subjective:   Emily Duarte is a 67 y.o. female patient admitted with psychosis.  HPI:  67 yo female who presented to the ED with an increase in delusions, tangential, and some paranoia.  Pleasant and cooperative.  No suicidal/homicidal ideations, hallucinations, or substance abuse.  Her son reports she has a history of mental illness and cognitive decline but is much worse.  She is being treated for endometrial cancer.  Inpatient geriatric recommended.  Past Psychiatric History: schizoaffective disorder  Risk to Self: Suicidal Ideation: No(Pt denies. ) Suicidal Intent: No Is patient at risk for suicide?: No Suicidal Plan?: No Access to Means: No What has been your use of drugs/alcohol within the last 12 months?: Opiates.  How many times?: 0 Other Self Harm Risks: Pt denies. Triggers for Past Attempts: None known Intentional Self Injurious Behavior: None(Pt denies.) Risk to Others: Homicidal Ideation: No(Pt denies. ) Thoughts of Harm to  Others: No Current Homicidal Intent: No Current Homicidal Plan: No Access to Homicidal Means: No Identified Victim: NA History of harm to others?: No Assessment of Violence: None Noted Violent Behavior Description: NA Does patient have access to weapons?: No(Pt denies. ) Criminal Charges Pending?: No Does patient have a court date: No Prior Inpatient Therapy: Prior Inpatient Therapy: No Prior Therapy Dates: NA Prior Therapy Facilty/Provider(s): NA Reason for Treatment: NA Prior Outpatient Therapy: Prior Outpatient Therapy: No Prior Therapy Dates: NA Prior Therapy Facilty/Provider(s): NA Reason for Treatment: NA Does patient have an ACCT team?: No Does patient have Intensive In-House Services?  : No Does patient have [facility] services? : No Does patient have P4CC services?: No  Past Medical History:  Past Medical History:  Diagnosis Date  . Endometrial cancer ([place])   . Hemorrhoid   . Hypertension   . Paranoid schizophrenia ([place])   . Snake bite     Past Surgical History:  Procedure Laterality Date  . ENDOMETRIAL BIOPSY  08/11/2016  . IR FLUORO GUIDE PORT INSERTION RIGHT  09/05/2016  . IR US GUIDE VASC ACCESS RIGHT  09/05/2016  . TUBAL LIGATION     Family History:  Family History  Problem Relation Age of Onset  . Colon cancer Mother        late 19s  . Schizophrenia Sister   . Colon cancer Sister        late 66 s   Family Psychiatric  History: none Social History:  Social History   Substance and Sexual Activity  Alcohol Use No     Social History   Substance and Sexual Activity  Drug Use No    Social History   Socioeconomic History  . Marital status: Married    Spouse name:  None  . Number of children: 1  . Years of education: None  . Highest education level: None  Social Needs  . Financial resource strain: None  . Food insecurity - worry: None  . Food insecurity - inability: None  . Transportation needs - medical: None  . Transportation needs -  non-medical: None  Occupational History  . Occupation: Retired  Tobacco Use  . Smoking status: Never Smoker  . Smokeless tobacco: Never Used  Substance and Sexual Activity  . Alcohol use: No  . Drug use: No  . Sexual activity: No  Other Topics Concern  . None  Social History Narrative  . None   Additional Social History:    Allergies:  No Known Allergies  Labs:  Results for orders placed or performed during the hospital encounter of 05/02/17 (from the past 48 hour(s))  Comprehensive metabolic panel     Status: Abnormal   Collection Time: 05/03/17 12:47 AM  Result Value Ref Range   Sodium 131 (L) 135 - 145 mmol/L   Potassium 3.8 3.5 - 5.1 mmol/L   Chloride 98 (L) 101 - 111 mmol/L   CO2 25 22 - 32 mmol/L   Glucose, Bld 151 (H) 65 - 99 mg/dL   BUN 13 6 - 20 mg/dL   Creatinine, Ser 0.74 0.44 - 1.00 mg/dL   Calcium 7.8 (L) 8.9 - 10.3 mg/dL   Total Protein 6.9 6.5 - 8.1 g/dL   Albumin 2.4 (L) 3.5 - 5.0 g/dL   AST 28 15 - 41 U/L   ALT 18 14 - 54 U/L   Alkaline Phosphatase 109 38 - 126 U/L   Total Bilirubin 0.9 0.3 - 1.2 mg/dL   GFR calc non Af Amer >60 >60 mL/min   GFR calc Af Amer >60 >60 mL/min    Comment: (NOTE) The eGFR has been calculated using the CKD EPI equation. This calculation has not been validated in all clinical situations. eGFR's persistently <60 mL/min signify possible Chronic Kidney Disease.    Anion gap 8 5 - 15  CBC with Differential     Status: Abnormal   Collection Time: 05/03/17 12:47 AM  Result Value Ref Range   WBC 12.5 (H) 4.0 - 10.5 K/uL   RBC 2.88 (L) 3.87 - 5.11 MIL/uL   Hemoglobin 8.2 (L) 12.0 - 15.0 g/dL   HCT 25.3 (L) 36.0 - 46.0 %   MCV 87.8 78.0 - 100.0 fL   MCH 28.5 26.0 - 34.0 pg   MCHC 32.4 30.0 - 36.0 g/dL   RDW 16.9 (H) 11.5 - 15.5 %   Platelets 502 (H) 150 - 400 K/uL   Neutrophils Relative % 92 %   Neutro Abs 11.5 (H) 1.7 - 7.7 K/uL   Lymphocytes Relative 5 %   Lymphs Abs 0.6 (L) 0.7 - 4.0 K/uL   Monocytes Relative 3 %    Monocytes Absolute 0.4 0.1 - 1.0 K/uL   Eosinophils Relative 0 %   Eosinophils Absolute 0.0 0.0 - 0.7 K/uL   Basophils Relative 0 %   Basophils Absolute 0.0 0.0 - 0.1 K/uL  Acetaminophen level     Status: Abnormal   Collection Time: 05/03/17 12:47 AM  Result Value Ref Range   Acetaminophen (Tylenol), Serum <10 (L) 10 - 30 ug/mL    Comment:        THERAPEUTIC CONCENTRATIONS VARY SIGNIFICANTLY. A RANGE OF 10-30 ug/mL MAY BE AN EFFECTIVE CONCENTRATION FOR MANY PATIENTS. HOWEVER, SOME ARE BEST TREATED AT CONCENTRATIONS OUTSIDE THIS  RANGE. ACETAMINOPHEN CONCENTRATIONS >150 ug/mL AT 4 HOURS AFTER INGESTION AND >50 ug/mL AT 12 HOURS AFTER INGESTION ARE OFTEN ASSOCIATED WITH TOXIC REACTIONS.   Salicylate level     Status: None   Collection Time: 05/03/17 12:47 AM  Result Value Ref Range   Salicylate Lvl <6.4 2.8 - 30.0 mg/dL  Ammonia     Status: None   Collection Time: 05/03/17  3:13 AM  Result Value Ref Range   Ammonia 10 9 - 35 umol/L  Urinalysis, Routine w reflex microscopic     Status: Abnormal   Collection Time: 05/03/17  3:39 AM  Result Value Ref Range   Color, Urine AMBER (A) YELLOW    Comment: BIOCHEMICALS MAY BE AFFECTED BY COLOR   APPearance HAZY (A) CLEAR   Specific Gravity, Urine 1.025 1.005 - 1.030   pH 5.0 5.0 - 8.0   Glucose, UA 50 (A) NEGATIVE mg/dL   Hgb urine dipstick NEGATIVE NEGATIVE   Bilirubin Urine NEGATIVE NEGATIVE   Ketones, ur NEGATIVE NEGATIVE mg/dL   Protein, ur NEGATIVE NEGATIVE mg/dL   Nitrite NEGATIVE NEGATIVE   Leukocytes, UA NEGATIVE NEGATIVE  Urine culture     Status: Abnormal   Collection Time: 05/03/17  3:39 AM  Result Value Ref Range   Specimen Description URINE, CLEAN CATCH    Special Requests NONE    Culture MULTIPLE SPECIES PRESENT, SUGGEST RECOLLECTION (A)    Report Status 05/04/2017 FINAL   Urine rapid drug screen (hosp performed)     Status: Abnormal   Collection Time: 05/03/17  3:39 AM  Result Value Ref Range   Opiates  POSITIVE (A) NONE DETECTED   Cocaine NONE DETECTED NONE DETECTED   Benzodiazepines NONE DETECTED NONE DETECTED   Amphetamines NONE DETECTED NONE DETECTED   Tetrahydrocannabinol NONE DETECTED NONE DETECTED   Barbiturates NONE DETECTED NONE DETECTED    Comment: (NOTE) DRUG SCREEN FOR MEDICAL PURPOSES ONLY.  IF CONFIRMATION IS NEEDED FOR ANY PURPOSE, NOTIFY LAB WITHIN 5 DAYS. LOWEST DETECTABLE LIMITS FOR URINE DRUG SCREEN Drug Class                     Cutoff (ng/mL) Amphetamine and metabolites    1000 Barbiturate and metabolites    200 Benzodiazepine                 332 Tricyclics and metabolites     300 Opiates and metabolites        300 Cocaine and metabolites        300 THC                            50     Current Facility-Administered Medications  Medication Dose Route Frequency Provider Last Rate Last Dose  . gabapentin (NEURONTIN) capsule 200 mg  200 mg Oral BID Edona Schreffler, MD   200 mg at 05/04/17 1000  . HYDROcodone-acetaminophen (NORCO) 10-325 MG per tablet 1 tablet  1 tablet Oral Q4H PRN Patrecia Pour, NP      . prochlorperazine (COMPAZINE) tablet 10 mg  10 mg Oral Q6H PRN Lorin Glass, PA-C      . risperiDONE (RISPERDAL) tablet 0.5 mg  0.5 mg Oral BID Gloriana Piltz, MD   0.5 mg at 05/04/17 1000   Current Outpatient Medications  Medication Sig Dispense Refill  . divalproex (DEPAKOTE ER) 500 MG 24 hr tablet Take 500 mg by mouth at bedtime.    Marland Kitchen HYDROcodone-acetaminophen (  NORCO) 10-325 MG tablet Take 1 tablet by mouth every 4 (four) hours as needed. (Patient taking differently: Take 1 tablet by mouth every 4 (four) hours as needed for moderate pain or severe pain. ) 60 tablet 0  . ondansetron (ZOFRAN) 8 MG tablet Take 1 tablet (8 mg total) by mouth every 8 (eight) hours as needed for nausea. 30 tablet 3  . prochlorperazine (COMPAZINE) 10 MG tablet Take 1 tablet (10 mg total) by mouth every 6 (six) hours as needed for nausea or vomiting. 30 tablet 1  .  HYDROcodone-acetaminophen (NORCO/VICODIN) 5-325 MG tablet Take 1 tablet by mouth every 6 (six) hours as needed for moderate pain. (Patient not taking: Reported on 05/02/2017) 30 tablet 0    Musculoskeletal: Strength & Muscle Tone: within normal limits Gait & Station: normal Patient leans: N/A  Psychiatric Specialty Exam: Physical Exam  Constitutional: She appears well-developed and well-nourished.  HENT:  Head: Normocephalic.  Neck: Normal range of motion.  Respiratory: Effort normal.  Musculoskeletal: Normal range of motion.  Neurological: She is alert.  Psychiatric: Judgment normal. Her affect is labile. Her speech is tangential. She is actively hallucinating. Thought content is delusional. Cognition and memory are impaired.    Review of Systems  Psychiatric/Behavioral: Positive for hallucinations and memory loss.  All other systems reviewed and are negative.   Blood pressure 113/73, pulse 78, temperature 97.6 F (36.4 C), temperature source Oral, resp. rate 16, SpO2 100 %.There is no height or weight on file to calculate BMI.  General Appearance: Casual  Eye Contact:  Fair  Speech:  Normal Rate  Volume:  Normal  Mood:  Anxious  Affect:  Blunt  Thought Process:  Coherent and Descriptions of Associations: Tangential  Orientation:  Other:  person  Thought Content:  Delusions and Paranoid Ideation  Suicidal Thoughts:  No  Homicidal Thoughts:  No  Memory:  Immediate;   Fair Recent;   Poor Remote;   Poor  Judgement:  Impaired  Insight:  Fair  Psychomotor Activity:  Decreased  Concentration:  Concentration: Fair and Attention Span: Fair  Recall:  Poor  Fund of Knowledge:  Fair  Language:  Fair  Akathisia:  No  Handed:  Right  AIMS (if indicated):     Assets:  Housing Leisure Time Physical Health Resilience Social Support  ADL's:  Intact  Cognition:  Impaired,  Moderate  Sleep:        Treatment Plan Summary: Daily contact with patient to assess and evaluate  symptoms and progress in treatment, Medication management and Plan schizoaffective disorder, bipolar type:  -Crisis stabilization -Medication management:  Medical medications restarted, started Risperdal 0.5 mg BID for psychosis -Individual counseling  Disposition: Recommend psychiatric Inpatient admission when medically cleared.  Waylan Boga, NP 05/04/2017 4:32 PM  Patient seen face-to-face for psychiatric evaluation, chart reviewed and case discussed with the physician extender and developed treatment plan. Reviewed the information documented and agree with the treatment plan. Corena Pilgrim, MD

## 2017-05-05 ENCOUNTER — Emergency Department (HOSPITAL_COMMUNITY): Payer: Medicare Other

## 2017-05-05 DIAGNOSIS — F251 Schizoaffective disorder, depressive type: Secondary | ICD-10-CM | POA: Diagnosis not present

## 2017-05-05 DIAGNOSIS — F22 Delusional disorders: Secondary | ICD-10-CM | POA: Insufficient documentation

## 2017-05-05 NOTE — Consult Note (Signed)
[facility] Consult   Reason for Consult:  Delusional Referring Physician:  EDP Patient Identification: Emily Duarte MRN:  [id number] Principal Diagnosis: Schizoaffective disorder, depressive type [facility]) Diagnosis:   Patient Active Problem List   Diagnosis Date Noted  . Delusions ([place]) [F22]   . Goals of care, counseling/discussion [Z71.89] 03/12/2017  . 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 Protein-calorie malnutrition, moderate ([place]) [E44.0] 03/03/2017  . Schizoaffective disorder, depressive type ([place]) [F25.1] 02/05/2017  . 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 Elevated liver enzymes [R74.8] 09/06/2016  . Anemia, chronic disease [D63.8] 09/06/2016  . Endometrial/uterine adenocarcinoma ([place]) [C54.1] 08/19/2016  . Bipolar 1 disorder, mixed, severe ([place]) [F31.63] 08/07/2012  . Cyst of soft tissue [M79.89] 08/07/2012    Total Time spent with patient: 45 minutes  Subjective:   Emily Duarte is a 67 y.o. female patient admitted with delusional thinking.  HPI:  Pt was seen and chart reviewed with treatment team and Dr Mariea Clonts. Pt stated she lives alone and believes snakes are on her porch that occasionally bite her. Pt stated she has a son but did not specify where her son lives.  Pt made reference to having a gag order on her but she could not keep quiet. Pt has pressured speech with disorganized thought processes. Pt stated she would like to have an inpatient admission to have her medications adjusted. Pt's son is concerned due to her increased delusions over the past week. Per note; Pt's son is her POA.  Pt would benefit from an inpatient psychiatric admission for crisis stabilization and medication management.   Past Psychiatric History: As above  Risk to Self: Suicidal Ideation: No(Pt denies. ) Suicidal Intent: No Is patient at risk for suicide?: No Suicidal Plan?: No Access to Means: No What has been  your use of drugs/alcohol within the last 12 months?: Opiates.  How many times?: 0 Other Self Harm Risks: Pt denies. Triggers for Past Attempts: None known Intentional Self Injurious Behavior: None(Pt denies.) Risk to Others: Homicidal Ideation: No(Pt denies. ) Thoughts of Harm to Others: No Current Homicidal Intent: No Current Homicidal Plan: No Access to Homicidal Means: No Identified Victim: NA History of harm to others?: No Assessment of Violence: None Noted Violent Behavior Description: NA Does patient have access to weapons?: No(Pt denies. ) Criminal Charges Pending?: No Does patient have a court date: No Prior Inpatient Therapy: Prior Inpatient Therapy: No Prior Therapy Dates: NA Prior Therapy Facilty/Provider(s): NA Reason for Treatment: NA Prior Outpatient Therapy: Prior Outpatient Therapy: No Prior Therapy Dates: NA Prior Therapy Facilty/Provider(s): NA Reason for Treatment: NA Does patient have an ACCT team?: No Does patient have Intensive In-House Services?  : No Does patient have [facility] services? : No Does patient have P4CC services?: No  Past Medical History:  Past Medical History:  Diagnosis Date  . Endometrial cancer ([place])   . Hemorrhoid   . Hypertension   . Paranoid schizophrenia ([place])   . Snake bite     Past Surgical History:  Procedure Laterality Date  . ENDOMETRIAL BIOPSY  08/11/2016  . IR FLUORO GUIDE PORT INSERTION RIGHT  09/05/2016  . IR US GUIDE VASC ACCESS RIGHT  09/05/2016  . TUBAL LIGATION     Family History:  Family History  Problem Relation Age of Onset  . Colon cancer Mother        late 49s  . Schizophrenia Sister   . Colon cancer Sister  late 30 s   Family Psychiatric  History: Unknown Social History:  Social History   Substance and Sexual Activity  Alcohol Use No     Social History   Substance and Sexual Activity  Drug Use No    Social History   Socioeconomic History  . Marital status: Married    Spouse name:  None  . Number of children: 1  . Years of education: None  . Highest education level: None  Social Needs  . Financial resource strain: None  . Food insecurity - worry: None  . Food insecurity - inability: None  . Transportation needs - medical: None  . Transportation needs - non-medical: None  Occupational History  . Occupation: Retired  Tobacco Use  . Smoking status: Never Smoker  . Smokeless tobacco: Never Used  Substance and Sexual Activity  . Alcohol use: No  . Drug use: No  . Sexual activity: No  Other Topics Concern  . None  Social History Narrative  . None   Additional Social History:    Allergies:  No Known Allergies  Labs: No results found for this or any previous visit (from the past 48 hour(s)).  Current Facility-Administered Medications  Medication Dose Route Frequency Provider Last Rate Last Dose  . gabapentin (NEURONTIN) capsule 200 mg  200 mg Oral BID Darleene Cleaver, Mojeed, MD   200 mg at 05/05/17 0915  . HYDROcodone-acetaminophen (NORCO) 10-325 MG per tablet 1 tablet  1 tablet Oral Q4H PRN Patrecia Pour, NP   1 tablet at 05/05/17 0915  . prochlorperazine (COMPAZINE) tablet 10 mg  10 mg Oral Q6H PRN Lorin Glass, PA-C      . risperiDONE (RISPERDAL) tablet 0.5 mg  0.5 mg Oral BID Corena Pilgrim, MD   0.5 mg at 05/05/17 0915   Current Outpatient Medications  Medication Sig Dispense Refill  . divalproex (DEPAKOTE ER) 500 MG 24 hr tablet Take 500 mg by mouth at bedtime.    Marland Kitchen HYDROcodone-acetaminophen (NORCO) 10-325 MG tablet Take 1 tablet by mouth every 4 (four) hours as needed. (Patient taking differently: Take 1 tablet by mouth every 4 (four) hours as needed for moderate pain or severe pain. ) 60 tablet 0  . ondansetron (ZOFRAN) 8 MG tablet Take 1 tablet (8 mg total) by mouth every 8 (eight) hours as needed for nausea. 30 tablet 3  . prochlorperazine (COMPAZINE) 10 MG tablet Take 1 tablet (10 mg total) by mouth every 6 (six) hours as needed for nausea or  vomiting. 30 tablet 1  . HYDROcodone-acetaminophen (NORCO/VICODIN) 5-325 MG tablet Take 1 tablet by mouth every 6 (six) hours as needed for moderate pain. (Patient not taking: Reported on 05/02/2017) 30 tablet 0    Musculoskeletal: Strength & Muscle Tone: within normal limits Gait & Station: normal Patient leans: N/A  Psychiatric Specialty Exam: Physical Exam  Constitutional: She appears well-developed and well-nourished.  HENT:  Head: Normocephalic.  Respiratory: Effort normal.  Musculoskeletal: Normal range of motion.  Neurological: She is alert.    Review of Systems  Psychiatric/Behavioral: Positive for hallucinations (visual). Negative for depression, memory loss, substance abuse and suicidal ideas. The patient is not nervous/anxious and does not have insomnia.   All other systems reviewed and are negative.   Blood pressure (!) 110/52, pulse 88, temperature 98.1 F (36.7 C), resp. rate 16, SpO2 94 %.There is no height or weight on file to calculate BMI.  General Appearance: Disheveled  Eye Contact:  Good  Speech:  Pressured  Volume:  Normal  Mood:  Anxious  Affect:  Congruent  Thought Process:  Disorganized  Orientation:  Full (Time, Place, and Person)  Thought Content:  Illogical and Hallucinations: Visual  Suicidal Thoughts:  No  Homicidal Thoughts:  No  Memory:  Immediate;   Good Recent;   Good Remote;   Fair  Judgement:  Impaired  Insight:  Lacking  Psychomotor Activity:  Decreased  Concentration:  Concentration: Fair and Attention Span: Fair  Recall:  [employer] of Knowledge:  Fair  Language:  Good  Akathisia:  No  Handed:  Right  AIMS (if indicated):     Assets:  Agricultural consultant Housing Social Support  ADL's:  Intact  Cognition:  WNL  Sleep:        Treatment Plan Summary: Daily contact with patient to assess and evaluate symptoms and progress in treatment and Medication management (see MAR)  -Crisis  Stabilization  Disposition: Recommend psychiatric Inpatient admission when medically cleared. TTS to seek placement in a geriatric psychiatric facility.   Ethelene Hal, NP 05/05/2017 11:54 AM

## 2017-05-05 NOTE — BH Assessment (Signed)
[facility] Assessment Progress Note  Per Hampton Abbot, MD, this pt requires psychiatric hospitalization at this time.  At 13:10 Pamala Hurry calls from [facility].  Pt has been accepted to their facility by Dr Launa Grill to the [facility] [facility], Rm 153-2.  Ethelene Hal, FNP, concurs with this disposition, as does the pt who is currently under voluntary status.  Pt's nurse, Lala Lund, has been notified, and agrees to call report to [phone].  Pt is to be transported via Stacey Drain, [facility] Coordinator [phone]

## 2017-05-05 NOTE — ED Notes (Signed)
[facility] transported to [facility]

## 2017-05-05 NOTE — Progress Notes (Addendum)
CSW received call from [place] with [facility] stating they are currently looking over patients information and will notify CSW if able to accept.   Kingsley Spittle, [facility] Emergency Room Clinical Social Worker [phone]

## 2017-05-05 NOTE — ED Notes (Signed)
Report called to [facility] charge [employer]

## 2017-05-09 ENCOUNTER — Telehealth: Payer: Self-pay | Admitting: *Deleted

## 2017-05-09 ENCOUNTER — Ambulatory Visit: Payer: Self-pay | Admitting: Hematology and Oncology

## 2017-05-09 ENCOUNTER — Ambulatory Visit: Payer: Self-pay

## 2017-05-09 NOTE — Telephone Encounter (Signed)
Received voice mail message from son, Deveron Furlong, that patient fell this morning and he needs to cancel all appointments scheduled for today. Return number is [phone]. Will give MD this message to reschedule appointments.

## 2017-05-15 ENCOUNTER — Telehealth: Payer: Self-pay | Admitting: *Deleted

## 2017-05-15 ENCOUNTER — Other Ambulatory Visit: Payer: Self-pay | Admitting: *Deleted

## 2017-05-15 MED ORDER — HYDROCODONE-ACETAMINOPHEN 10-325 MG PO TABS: 1.0000 | ORAL_TABLET | ORAL | 0 refills | Status: DC | PRN

## 2017-05-15 NOTE — Telephone Encounter (Signed)
Freddie says either Tuesday or Wednesday will be fine for labs and see you, with treatment on Friday

## 2017-05-15 NOTE — Telephone Encounter (Signed)
Freddie left a message that [place] needs a refill of Norco 10-325. And needs her next appts.

## 2017-05-15 NOTE — Telephone Encounter (Signed)
I need to see her and labs different day because of her long chemo infusion Would it work it I bring her in Tues/wed morning next week and infusion Friday OK to refill Let me know and I will put scheduling msg

## 2017-05-15 NOTE — Telephone Encounter (Signed)
LM to call Dr Gorsuch' nurse 

## 2017-05-16 ENCOUNTER — Telehealth: Payer: Self-pay | Admitting: Hematology and Oncology

## 2017-05-16 NOTE — Telephone Encounter (Signed)
Spoke to patients son regarding upcoming January and February appointments per 1/24 sch message

## 2017-05-21 ENCOUNTER — Other Ambulatory Visit: Payer: Self-pay | Admitting: Hematology and Oncology

## 2017-05-21 ENCOUNTER — Ambulatory Visit: Payer: Self-pay | Admitting: Hematology and Oncology

## 2017-05-21 ENCOUNTER — Other Ambulatory Visit: Payer: Self-pay

## 2017-05-21 ENCOUNTER — Telehealth: Payer: Self-pay | Admitting: *Deleted

## 2017-05-21 NOTE — Telephone Encounter (Signed)
LM to call Dr Gorsuch's nurse 

## 2017-05-21 NOTE — Telephone Encounter (Signed)
Freddie states that they do want to continue chemotherapy. Do not want Palliative care yet.  Notified that chemo on Friday this week has been cancelled.   Understands that schedulers will be calling for next appts

## 2017-05-22 ENCOUNTER — Telehealth: Payer: Self-pay | Admitting: Hematology and Oncology

## 2017-05-22 NOTE — Telephone Encounter (Signed)
Scheduled appt per 1/31 sch msg - left voicemail for patient regarding appts.

## 2017-05-23 ENCOUNTER — Ambulatory Visit: Payer: Self-pay

## 2017-05-23 ENCOUNTER — Ambulatory Visit: Payer: Self-pay | Admitting: Hematology and Oncology

## 2017-05-26 ENCOUNTER — Telehealth: Payer: Self-pay

## 2017-05-26 NOTE — Telephone Encounter (Signed)
Son called and left message to call him. He was out of town working and did not get the message that his Mom had appt Friday. He is sorry that his Mom missed the appt, would like to rescedule.  Call back. Scheduling message sent per Dr. Alvy Bimler for 2/12.

## 2017-05-28 ENCOUNTER — Telehealth: Payer: Self-pay | Admitting: Hematology and Oncology

## 2017-05-28 NOTE — Telephone Encounter (Signed)
Left message for patients son regarding upcoming February appointments per 2/4 sch message.

## 2017-06-06 ENCOUNTER — Other Ambulatory Visit: Payer: Self-pay | Admitting: Hematology and Oncology

## 2017-06-06 ENCOUNTER — Other Ambulatory Visit: Payer: Self-pay | Admitting: *Deleted

## 2017-06-06 ENCOUNTER — Inpatient Hospital Stay (HOSPITAL_BASED_OUTPATIENT_CLINIC_OR_DEPARTMENT_OTHER): Payer: Medicare Other | Admitting: Hematology and Oncology

## 2017-06-06 ENCOUNTER — Encounter: Payer: Self-pay | Admitting: Hematology and Oncology

## 2017-06-06 ENCOUNTER — Inpatient Hospital Stay: Payer: Medicare Other

## 2017-06-06 ENCOUNTER — Ambulatory Visit: Payer: Medicare Other

## 2017-06-06 ENCOUNTER — Telehealth: Payer: Self-pay | Admitting: Hematology and Oncology

## 2017-06-06 ENCOUNTER — Inpatient Hospital Stay: Payer: Medicare Other | Attending: Hematology and Oncology

## 2017-06-06 VITALS — Ht 64.0 in | Wt 143.5 lb

## 2017-06-06 VITALS — BP 106/78 | HR 119 | Temp 98.3°F | Resp 18

## 2017-06-06 DIAGNOSIS — R748 Abnormal levels of other serum enzymes: Secondary | ICD-10-CM | POA: Diagnosis not present

## 2017-06-06 DIAGNOSIS — C541 Malignant neoplasm of endometrium: Secondary | ICD-10-CM | POA: Insufficient documentation

## 2017-06-06 DIAGNOSIS — R112 Nausea with vomiting, unspecified: Secondary | ICD-10-CM | POA: Diagnosis not present

## 2017-06-06 DIAGNOSIS — E876 Hypokalemia: Secondary | ICD-10-CM

## 2017-06-06 DIAGNOSIS — IMO0001 Reserved for inherently not codable concepts without codable children: Secondary | ICD-10-CM

## 2017-06-06 DIAGNOSIS — R911 Solitary pulmonary nodule: Secondary | ICD-10-CM | POA: Insufficient documentation

## 2017-06-06 DIAGNOSIS — Z170 Estrogen receptor positive status [ER+]: Secondary | ICD-10-CM | POA: Insufficient documentation

## 2017-06-06 DIAGNOSIS — Z79899 Other long term (current) drug therapy: Secondary | ICD-10-CM

## 2017-06-06 DIAGNOSIS — Z5111 Encounter for antineoplastic chemotherapy: Secondary | ICD-10-CM | POA: Insufficient documentation

## 2017-06-06 DIAGNOSIS — D638 Anemia in other chronic diseases classified elsewhere: Secondary | ICD-10-CM

## 2017-06-06 DIAGNOSIS — E440 Moderate protein-calorie malnutrition: Secondary | ICD-10-CM | POA: Insufficient documentation

## 2017-06-06 LAB — COMPREHENSIVE METABOLIC PANEL
ALBUMIN: 2.1 g/dL — AB (ref 3.5–5.0)
ALT: 18 U/L (ref 0–55)
AST: 57 U/L — ABNORMAL HIGH (ref 5–34)
Alkaline Phosphatase: 161 U/L — ABNORMAL HIGH (ref 40–150)
Anion gap: 16 — ABNORMAL HIGH (ref 3–11)
BILIRUBIN TOTAL: 0.7 mg/dL (ref 0.2–1.2)
BUN: 17 mg/dL (ref 7–26)
CHLORIDE: 96 mmol/L — AB (ref 98–109)
CO2: 25 mmol/L (ref 22–29)
Calcium: 7.8 mg/dL — ABNORMAL LOW (ref 8.4–10.4)
Creatinine, Ser: 1.37 mg/dL — ABNORMAL HIGH (ref 0.60–1.10)
GFR calc Af Amer: 45 mL/min — ABNORMAL LOW (ref >60)
GFR calc non Af Amer: 39 mL/min — ABNORMAL LOW (ref >60)
GLUCOSE: 99 mg/dL (ref 70–140)
POTASSIUM: 2.5 mmol/L — AB (ref 3.5–5.1)
SODIUM: 137 mmol/L (ref 136–145)
Total Protein: 6.9 g/dL (ref 6.4–8.3)

## 2017-06-06 LAB — CBC WITH DIFFERENTIAL/PLATELET
BASOS ABS: 0.1 10*3/uL (ref 0.0–0.1)
BASOS PCT: 1 %
EOS ABS: 0.1 10*3/uL (ref 0.0–0.5)
Eosinophils Relative: 0 %
HEMATOCRIT: 27.7 % — AB (ref 34.8–46.6)
Hemoglobin: 9.3 g/dL — ABNORMAL LOW (ref 11.6–15.9)
Lymphocytes Relative: 15 %
Lymphs Abs: 2.2 10*3/uL (ref 0.9–3.3)
MCH: 29.6 pg (ref 25.1–34.0)
MCHC: 33.6 g/dL (ref 31.5–36.0)
MCV: 88.3 fL (ref 79.5–101.0)
MONO ABS: 1.2 10*3/uL — AB (ref 0.1–0.9)
Monocytes Relative: 8 %
NEUTROS ABS: 11.6 10*3/uL — AB (ref 1.5–6.5)
Neutrophils Relative %: 76 %
PLATELETS: 527 10*3/uL — AB (ref 145–400)
RBC: 3.14 MIL/uL — ABNORMAL LOW (ref 3.70–5.45)
RDW: 15.4 % — AB (ref 11.2–14.5)
WBC: 15.2 10*3/uL — ABNORMAL HIGH (ref 3.9–10.3)

## 2017-06-06 LAB — SAMPLE TO BLOOD BANK

## 2017-06-06 MED ORDER — SODIUM CHLORIDE 0.9 % IV SOLN
Freq: Once | INTRAVENOUS | Status: AC
  Administered 2017-06-06: 13:00:00 via INTRAVENOUS
  Filled 2017-06-06: qty 5

## 2017-06-06 MED ORDER — DEXAMETHASONE 4 MG PO TABS: 4.0000 mg | ORAL_TABLET | Freq: Two times a day (BID) | ORAL | 1 refills | Status: DC

## 2017-06-06 MED ORDER — SODIUM CHLORIDE 0.9% FLUSH
10.0000 mL | Freq: Once | INTRAVENOUS | Status: AC
  Administered 2017-06-06: 10 mL
  Filled 2017-06-06: qty 10

## 2017-06-06 MED ORDER — POTASSIUM CHLORIDE CRYS ER 20 MEQ PO TBCR
20.0000 meq | EXTENDED_RELEASE_TABLET | Freq: Two times a day (BID) | ORAL | 0 refills | Status: DC
Start: 2017-06-06 — End: 2017-08-14

## 2017-06-06 MED ORDER — PALONOSETRON HCL INJECTION 0.25 MG/5ML
0.2500 mg | Freq: Once | INTRAVENOUS | Status: AC
  Administered 2017-06-06: 0.25 mg via INTRAVENOUS

## 2017-06-06 MED ORDER — SODIUM CHLORIDE 0.9 % IV SOLN
Freq: Once | INTRAVENOUS | Status: AC
  Administered 2017-06-06: 10:00:00 via INTRAVENOUS

## 2017-06-06 MED ORDER — POTASSIUM CHLORIDE 2 MEQ/ML IV SOLN
Freq: Once | INTRAVENOUS | Status: AC
  Administered 2017-06-06: 10:00:00 via INTRAVENOUS
  Filled 2017-06-06: qty 10

## 2017-06-06 MED ORDER — PALONOSETRON HCL INJECTION 0.25 MG/5ML
INTRAVENOUS | Status: AC
  Filled 2017-06-06: qty 5

## 2017-06-06 MED ORDER — MAGNESIUM OXIDE 400 (241.3 MG) MG PO TABS: 400.0000 mg | ORAL_TABLET | Freq: Every day | ORAL | 0 refills | Status: DC

## 2017-06-06 MED ORDER — DOXORUBICIN HCL CHEMO IV INJECTION 2 MG/ML
40.0000 mg/m2 | Freq: Once | INTRAVENOUS | Status: AC
  Administered 2017-06-06: 76 mg via INTRAVENOUS
  Filled 2017-06-06: qty 38

## 2017-06-06 MED ORDER — SODIUM CHLORIDE 0.9% FLUSH
10.0000 mL | INTRAVENOUS | Status: DC | PRN
  Administered 2017-06-06: 10 mL
  Filled 2017-06-06: qty 10

## 2017-06-06 MED ORDER — SODIUM CHLORIDE 0.9 % IV SOLN
40.0000 mg/m2 | Freq: Once | INTRAVENOUS | Status: AC
  Administered 2017-06-06: 76 mg via INTRAVENOUS
  Filled 2017-06-06: qty 76

## 2017-06-06 MED ORDER — HEPARIN SOD (PORK) LOCK FLUSH 100 UNIT/ML IV SOLN
500.0000 [IU] | Freq: Once | INTRAVENOUS | Status: AC | PRN
  Administered 2017-06-06: 500 [IU]
  Filled 2017-06-06: qty 5

## 2017-06-06 NOTE — Progress Notes (Signed)
Dr. Alvy Bimler okay to tx with HR 119, K 2.5, Ca 7.8, and Cl 96. Prescription sent ot pt preferred pharmacy by Erline Levine, RN for K and Mg replacement. Pt additionally experiencing nausea and vomiting at home, but refusing to take oral antiemetics. Dr. Alvy Bimler encouraged pt to take antiemetics at home. Pt receiving aloxi and emend IV for nausea as pre-medications today.   First two hours of pre-fluids completed. Dr. Alvy Bimler aware pt has had minimal output (too little to measure) and has started to have minimal, lime green diarrhea. Encouraging pt to drink fluids. Pt agreed to gingerale, but has had minimal intake.

## 2017-06-06 NOTE — Assessment & Plan Note (Signed)
She does not need blood transfusion We will monitor blood counts carefully Plan to see her back in 2 weeks to recheck her blood and transfuse if needed

## 2017-06-06 NOTE — Telephone Encounter (Signed)
Gave patient AVs and calendar of upcoming march and April appointments.  °

## 2017-06-06 NOTE — Progress Notes (Signed)
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[facility]) The patient has significant nausea recently along with vomiting but have declined taking antiemetics I reinforced the importance of her taking her medications to alleviate her symptoms After today's dose, I plan to repeat CT imaging in 2 weeks and see her back to review test results, along with echocardiogram to evaluate cardiac function after 3 cycles of treatment We will continue supportive care Since her vaginal bleeding has stopped and she denies pain, I suspect she might have positive response to treatment With recent weight loss and symptoms, I plan to reduce the dose of chemotherapy by 20%  Anemia, chronic disease She does not need blood transfusion We will monitor blood counts carefully Plan to see her back in 2 weeks to recheck her blood and transfuse if needed  Hypokalemia She has severe hypokalemia, likely related to recent nausea and vomiting and side effects from chemo I would prescribe magnesium supplement along with potassium replacement therapy I will check magnesium level in her next visit  Elevated liver enzymes Likely due to her chemotherapy.  Observe only  Lung nodule < 6cm on CT She had lung nodule on prior CT I plan to restage with CT scan of the chest to exclude metastatic cancer in the chest  Protein-calorie malnutrition, moderate (HCC) I am starting her on dexamethasone to control nausea I encouraged her to increase oral intake as tolerated  Nausea with vomiting She has severe nausea and vomiting but declined taking antiemetics as prescribed. She has lost a lot of weight I recommend dexamethasone twice a day along with antiemetics to control her nausea   Orders Placed This Encounter  Procedures  . CT ABDOMEN PELVIS W CONTRAST    Standing Status:   Future   Standing Expiration Date:   06/06/2018    Order Specific Question:   If indicated for the ordered procedure, I authorize the administration of contrast media per Radiology protocol    Answer:   Yes    Order Specific Question:   Preferred imaging location?    Answer:   [facility]    Order Specific Question:   Radiology Contrast Protocol - do NOT remove file path    Answer:   \\[email or web address].pdf  . CT CHEST W CONTRAST    Standing Status:   Future    Standing Expiration Date:   06/06/2018    Order Specific Question:   If indicated for the ordered procedure, I authorize the administration of contrast media per Radiology protocol    Answer:   Yes    Order Specific Question:   Preferred imaging location?    Answer:   [facility]    Order Specific Question:   Radiology Contrast Protocol - do NOT remove file path    Answer:   \\[email or web address].pdf  . ECHOCARDIOGRAM LIMITED    Standing Status:   Future    Standing Expiration Date:   09/04/2018    Order Specific Question:   Where should this test be performed    Answer:   [place]    Order Specific Question:   Perflutren DEFINITY (image enhancing agent) should be administered unless hypersensitivity or allergy exist    Answer:   Administer Perflutren    Order Specific Question:   Expected Date:    Answer:   1 week    INTERVAL HISTORY: Please see below  for problem oriented charting. She returns with her son for further follow-up She has recently missed several appointments due to feeling unwell She have uncontrolled nausea and vomiting but declined taking antiemetics She has lost some weight She have some loose bowel movement She has no further vaginal bleeding or abdominal pain. She denies peripheral neuropathy.  No recent infection.  SUMMARY OF ONCOLOGIC HISTORY: Oncology History   Focally ER positive MSI stable disease     Endometrial/uterine adenocarcinoma ([place])    08/01/2016 Initial Diagnosis    She was admitted to  [facility] on 08/01/16 with postmenopausal vaginal bleeding lasting 10 days. However, she was transferred to [facility] on 08/06/16 for suicidal ideation. There, GYN was consulted and ordered biopsy. Pt was transferred to medical unit for blood transfusion.       08/10/2016 Imaging    US pelvis:  The uterus is prominent and heterogeneous measuring 10.9 x 6.3 x 6.1 cm. There is a probable anterior exophytic fundal fibroid measuring 4.5 cm. The endometrial stripe is normal in thickness at 6.4 mm. I see no fluid in the canal  Right ovary is not visible. Left ovary 2.3 x 1.7 x 1.9 cm with normal echogenicity. Color flow and spectral Doppler: Normal arterial inflow and venous outflow.  There is no significant free fluid.   I see no adnexal masses.      08/11/2016 - 08/15/2016 Hospital Admission    She was admitted voluntarily to Psychiatry unit 08/06/2016 for suicidal ideation.She was evaluated in an outside facility due to vaginal bleeding and endometrial and endocervical biopsies performed. Pathology revealed a poorly differentiated cancer with positive estrogen receptors felt to be suggestive of endometrial cancer. The patient received 2 units of prbcs total during her stay for anemia.        08/11/2016 Pathology Results       1. Endocervix, curettings and polyp. (*please see comment below). --High-grade carcinoma, undifferentiated.  2. Endometrial biopsy:(*please see comment below). --High-grade carcinoma, undifferentiated. --Weakly positive for estrogen receptors suggestive of endometrial origin; Negative for progesterone receptors and neutral mucin..       08/14/2016 Imaging    CT imaging 1. Uterine mass and abnormality consistent with clinical history of endometrial carcinoma as described above with bilateral pelvic and retroperitoneal adenopathy consistent with nodal metastasis. 2. No evidence of nonnodal  distant metastasis. 3. Nonspecific incidental finding of hypertrophy of the pylorus muscle. 4. incidental finding of cholelithiasis      09/04/2016 Imaging    CT chest: 1. 4 mm nodule in the anterior aspect of the right upper lobe. This is highly nonspecific. Although metastatic disease is not excluded, it is not strongly favored. Attention under routine followup examinations is recommended to ensure the stability or resolution of this finding. 2. Trace right pleural effusion lying dependently is simple in appearance. 3. Mild cardiomegaly.      09/05/2016 Procedure    Successful placement of a right internal jugular approach power injectable Port-A-Cath. The catheter is ready for immediate use.      09/05/2016 Tumor Marker    Patient's tumor was tested for the following markers: CA125 Results of the tumor marker test revealed 1150      09/09/2016 - 12/30/2016 Chemotherapy    She received chemotherapy with carboplatin and Taxol x 6 cycles      09/30/2016 Tumor Marker    Patient's tumor was tested for the following markers: CA125 Results of the tumor marker test revealed 338.1  10/28/2016 Tumor Marker    Patient's tumor was tested for the following markers: CA125 Results of the tumor marker test revealed 210.9      11/08/2016 Imaging    CT scan abdomen and pelvis 1. Soft tissue nodularity throughout the omental and perihepatic fat worrisome for peritoneal carcinomatosis. No ascites. 2. No other definitive evidence of metastatic disease. There are prominent left pelvic sidewall and right inguinal lymph nodes. 3. Nonspecific findings in the uterus, likely related to treated tumor and fibroids. Prior pelvic imaging unavailable. 4. Cholelithiasis.      12/09/2016 Tumor Marker    Patient's tumor was tested for the following markers: CA125 Results of the tumor marker test revealed 98.1      12/30/2016 Tumor Marker    Patient's tumor was tested for the following markers: CA125 Results  of the tumor marker test revealed 106.1      01/07/2017 Imaging    1. Stable soft tissue nodularity along the omentum suspicious for peritoneal spread of tumor. Stable appearance of nodularity along the uterine fundus and indistinctness along the right adnexa, some of this may be due to fibroids. 2. The previously borderline enlarged left external iliac lymph node is now normal in size. 3. Stable 4 mm ill-defined right upper lobe pulmonary nodule anteriorly, this may well in depth being benign but merits surveillance. 4. Cholelithiasis.      03/03/2017 Tumor Marker    Patient's tumor was tested for the following markers: CA125 Results of the tumor marker test revealed 694.5      03/07/2017 Imaging    1. Clear interval progression of peritoneal and omental disease as above. Diffuse mesenteric thickening and nodularity has progressed. 2. New juxta diaphragmatic lymph nodes compatible with metastatic disease. 3. Cholelithiasis. 4. Uterine enlargement with probable fibroid change. Ill-defined hypovascular lesion in the lower uterine segment/cervix may represent primary malignancy although this is difficult to assess by CT.      03/18/2017 Imaging    ECHO: Normal LV size with EF 55-60%. Normal RV size and systolic function. Mild MR.       REVIEW OF SYSTEMS:   Constitutional: Denies fevers, chills or abnormal weight loss Eyes: Denies blurriness of vision Ears, nose, mouth, throat, and face: Denies mucositis or sore throat Respiratory: Denies cough, dyspnea or wheezes Cardiovascular: Denies palpitation, chest discomfort or lower extremity swelling Skin: Denies abnormal skin rashes Lymphatics: Denies new lymphadenopathy or easy bruising Neurological:Denies numbness, tingling or new weaknesses Behavioral/Psych: Mood is stable, no new changes  All other systems were reviewed with the patient and are negative.  I have reviewed the past medical history, past surgical history, social  history and family history with the patient and they are unchanged from previous note.  ALLERGIES:  has No Known Allergies.  MEDICATIONS:  Current Outpatient Medications  Medication Sig Dispense Refill  . dexamethasone (DECADRON) 4 MG tablet Take 1 tablet (4 mg total) by mouth 2 (two) times daily with a meal. 60 tablet 1  . divalproex (DEPAKOTE ER) 500 MG 24 hr tablet Take 500 mg by mouth at bedtime.    Marland Kitchen HYDROcodone-acetaminophen (NORCO) 10-325 MG tablet Take 1 tablet by mouth every 4 (four) hours as needed. 60 tablet 0  . HYDROcodone-acetaminophen (NORCO/VICODIN) 5-325 MG tablet Take 1 tablet by mouth every 6 (six) hours as needed for moderate pain. (Patient not taking: Reported on 05/02/2017) 30 tablet 0  . ondansetron (ZOFRAN) 8 MG tablet Take 1 tablet (8 mg total) by mouth every 8 (eight) hours as  needed for nausea. 30 tablet 3  . prochlorperazine (COMPAZINE) 10 MG tablet Take 1 tablet (10 mg total) by mouth every 6 (six) hours as needed for nausea or vomiting. 30 tablet 1   No current facility-administered medications for this visit.     PHYSICAL EXAMINATION: ECOG PERFORMANCE STATUS: 2 - Symptomatic, <50% confined to bed  Vitals:   06/06/17 0855  BP: 106/78  Pulse: (!) 119  Resp: 18  Temp: 98.3 F (36.8 C)  SpO2: 98%   Filed Weights    GENERAL:alert, no distress and comfortable.  She looks thin SKIN: skin color, texture, turgor are normal, no rashes or significant lesions EYES: normal, Conjunctiva are pink and non-injected, sclera clear OROPHARYNX:no exudate, no erythema and lips, buccal mucosa, and tongue normal.  Dry mucous membrane is noted NECK: supple, thyroid normal size, non-tender, without nodularity LYMPH:  no palpable lymphadenopathy in the cervical, axillary or inguinal LUNGS: clear to auscultation and percussion with normal breathing effort HEART: regular rate & rhythm and no murmurs and no lower extremity edema ABDOMEN:abdomen soft, non-tender and normal  bowel sounds Musculoskeletal:no cyanosis of digits and no clubbing  NEURO: alert & oriented x 3 with fluent speech, no focal motor/sensory deficits  LABORATORY DATA:  I have reviewed the data as listed    Component Value Date/Time   NA 137 06/06/2017 0824   NA 135 (L) 04/23/2017 1359   K 2.5 (LL) 06/06/2017 0824   K 3.8 04/23/2017 1359   CL 96 (L) 06/06/2017 0824   CO2 25 06/06/2017 0824   CO2 27 04/23/2017 1359   GLUCOSE 99 06/06/2017 0824   GLUCOSE 98 04/23/2017 1359   BUN 17 06/06/2017 0824   BUN 10.8 04/23/2017 1359   CREATININE 1.37 (H) 06/06/2017 0824   CREATININE 0.8 04/23/2017 1359   CALCIUM 7.8 (L) 06/06/2017 0824   CALCIUM 8.6 04/23/2017 1359   PROT 6.9 06/06/2017 0824   PROT 7.5 04/23/2017 1359   ALBUMIN 2.1 (L) 06/06/2017 0824   ALBUMIN 2.6 (L) 04/23/2017 1359   AST 57 (H) 06/06/2017 0824   AST 38 (H) 04/23/2017 1359   ALT 18 06/06/2017 0824   ALT 22 04/23/2017 1359   ALKPHOS 161 (H) 06/06/2017 0824   ALKPHOS 117 04/23/2017 1359   BILITOT 0.7 06/06/2017 0824   BILITOT 0.62 04/23/2017 1359   GFRNONAA 39 (L) 06/06/2017 0824   GFRAA 45 (L) 06/06/2017 0824    No results found for: SPEP, UPEP  Lab Results  Component Value Date   WBC 15.2 (H) 06/06/2017   NEUTROABS 11.6 (H) 06/06/2017   HGB 9.3 (L) 06/06/2017   HCT 27.7 (L) 06/06/2017   MCV 88.3 06/06/2017   PLT 527 (H) 06/06/2017      Chemistry      Component Value Date/Time   NA 137 06/06/2017 0824   NA 135 (L) 04/23/2017 1359   K 2.5 (LL) 06/06/2017 0824   K 3.8 04/23/2017 1359   CL 96 (L) 06/06/2017 0824   CO2 25 06/06/2017 0824   CO2 27 04/23/2017 1359   BUN 17 06/06/2017 0824   BUN 10.8 04/23/2017 1359   CREATININE 1.37 (H) 06/06/2017 0824   CREATININE 0.8 04/23/2017 1359      Component Value Date/Time   CALCIUM 7.8 (L) 06/06/2017 0824   CALCIUM 8.6 04/23/2017 1359   ALKPHOS 161 (H) 06/06/2017 0824   ALKPHOS 117 04/23/2017 1359   AST 57 (H) 06/06/2017 0824   AST 38 (H) 04/23/2017  1359   ALT 18  06/06/2017 0824   ALT 22 04/23/2017 1359   BILITOT 0.7 06/06/2017 0824   BILITOT 0.62 04/23/2017 1359       All questions were answered. The patient knows to call the clinic with any problems, questions or concerns. No barriers to learning was detected.  I spent 40 minutes counseling the patient face to face. The total time spent in the appointment was 55 minutes and more than 50% was on counseling and review of test results  Heath Lark, MD 06/06/2017 10:02 AM

## 2017-06-06 NOTE — Patient Instructions (Signed)
[place] Discharge Instructions for Patients Receiving Chemotherapy  Today you received the following chemotherapy agents doxorubicin (Adriamycin) and cisplatin (Platinol). To help prevent nausea and vomiting after your treatment, we encourage you to take your nausea medication as prescribed.    If you develop nausea and vomiting that is not controlled by your nausea medication, call the clinic.   BELOW ARE SYMPTOMS THAT SHOULD BE REPORTED IMMEDIATELY:  *FEVER GREATER THAN 100.5 F  *CHILLS WITH OR WITHOUT FEVER  NAUSEA AND VOMITING THAT IS NOT CONTROLLED WITH YOUR NAUSEA MEDICATION  *UNUSUAL SHORTNESS OF BREATH  *UNUSUAL BRUISING OR BLEEDING  TENDERNESS IN MOUTH AND THROAT WITH OR WITHOUT PRESENCE OF ULCERS  *URINARY PROBLEMS  *BOWEL PROBLEMS  UNUSUAL RASH Items with * indicate a potential emergency and should be followed up as soon as possible.  Feel free to call the clinic should you have any questions or concerns. The clinic phone number is [phone].  Please show the [place] at check-in to the Emergency Department and triage nurse.

## 2017-06-06 NOTE — Assessment & Plan Note (Signed)
She has severe hypokalemia, likely related to recent nausea and vomiting and side effects from chemo I would prescribe magnesium supplement along with potassium replacement therapy I will check magnesium level in her next visit

## 2017-06-06 NOTE — Assessment & Plan Note (Signed)
Likely due to her chemotherapy.  Observe only

## 2017-06-06 NOTE — Assessment & Plan Note (Signed)
She had lung nodule on prior CT I plan to restage with CT scan of the chest to exclude metastatic cancer in the chest

## 2017-06-06 NOTE — Assessment & Plan Note (Signed)
She has severe nausea and vomiting but declined taking antiemetics as prescribed. She has lost a lot of weight I recommend dexamethasone twice a day along with antiemetics to control her nausea

## 2017-06-06 NOTE — Assessment & Plan Note (Signed)
I am starting her on dexamethasone to control nausea I encouraged her to increase oral intake as tolerated

## 2017-06-06 NOTE — Assessment & Plan Note (Addendum)
The patient has significant nausea recently along with vomiting but have declined taking antiemetics I reinforced the importance of her taking her medications to alleviate her symptoms After today's dose, I plan to repeat CT imaging in 2 weeks and see her back to review test results, along with echocardiogram to evaluate cardiac function after 3 cycles of treatment We will continue supportive care Since her vaginal bleeding has stopped and she denies pain, I suspect she might have positive response to treatment With recent weight loss and symptoms, I plan to reduce the dose of chemotherapy by 20%

## 2017-06-07 LAB — CA 125: CANCER ANTIGEN (CA) 125: 710.5 U/mL — AB (ref 0.0–38.1)

## 2017-06-09 ENCOUNTER — Telehealth: Payer: Self-pay | Admitting: *Deleted

## 2017-06-09 NOTE — Telephone Encounter (Signed)
Called & spoke with son, Annalee Genta & he states that pt is doing much better.  He reports that her appetite has picked up.  Message to Dr Alvy Bimler.

## 2017-06-10 ENCOUNTER — Other Ambulatory Visit (HOSPITAL_COMMUNITY): Payer: Self-pay

## 2017-06-16 ENCOUNTER — Other Ambulatory Visit: Payer: Self-pay | Admitting: *Deleted

## 2017-06-16 MED ORDER — HYDROCODONE-ACETAMINOPHEN 10-325 MG PO TABS: 1.0000 | ORAL_TABLET | ORAL | 0 refills | Status: DC | PRN

## 2017-06-16 NOTE — Telephone Encounter (Signed)
Son left message stating patient needs a refill of Norco 10-325.

## 2017-06-20 ENCOUNTER — Ambulatory Visit (HOSPITAL_COMMUNITY)
Admission: RE | Admit: 2017-06-20 | Discharge: 2017-06-20 | Disposition: A | Discharge status: Home / Self Care | Payer: Medicare Other | Source: Ambulatory Visit | Attending: Hematology and Oncology | Admitting: Hematology and Oncology

## 2017-06-20 ENCOUNTER — Other Ambulatory Visit: Payer: Self-pay | Admitting: Hematology and Oncology

## 2017-06-20 ENCOUNTER — Telehealth: Payer: Self-pay | Admitting: Hematology and Oncology

## 2017-06-20 DIAGNOSIS — R911 Solitary pulmonary nodule: Secondary | ICD-10-CM | POA: Diagnosis present

## 2017-06-20 DIAGNOSIS — I82412 Acute embolism and thrombosis of left femoral vein: Secondary | ICD-10-CM | POA: Insufficient documentation

## 2017-06-20 DIAGNOSIS — F319 Bipolar disorder, unspecified: Secondary | ICD-10-CM | POA: Insufficient documentation

## 2017-06-20 DIAGNOSIS — IMO0001 Reserved for inherently not codable concepts without codable children: Secondary | ICD-10-CM

## 2017-06-20 DIAGNOSIS — K8020 Calculus of gallbladder without cholecystitis without obstruction: Secondary | ICD-10-CM | POA: Insufficient documentation

## 2017-06-20 DIAGNOSIS — C541 Malignant neoplasm of endometrium: Secondary | ICD-10-CM

## 2017-06-20 DIAGNOSIS — J90 Pleural effusion, not elsewhere classified: Secondary | ICD-10-CM | POA: Insufficient documentation

## 2017-06-20 DIAGNOSIS — F259 Schizoaffective disorder, unspecified: Secondary | ICD-10-CM | POA: Insufficient documentation

## 2017-06-20 DIAGNOSIS — E860 Dehydration: Secondary | ICD-10-CM | POA: Insufficient documentation

## 2017-06-20 MED ORDER — IOPAMIDOL (ISOVUE-300) INJECTION 61%
100.0000 mL | Freq: Once | INTRAVENOUS | Status: AC | PRN
  Administered 2017-06-20: 75 mL via INTRAVENOUS

## 2017-06-20 MED ORDER — SODIUM CHLORIDE 0.9 % IJ SOLN
INTRAMUSCULAR | Status: AC
  Filled 2017-06-20: qty 50

## 2017-06-20 MED ORDER — RIVAROXABAN (XARELTO) VTE STARTER PACK (15 & 20 MG): ORAL_TABLET | ORAL | 0 refills | Status: DC

## 2017-06-20 MED ORDER — IOPAMIDOL (ISOVUE-300) INJECTION 61%
INTRAVENOUS | Status: AC
  Filled 2017-06-20: qty 100

## 2017-06-20 NOTE — Telephone Encounter (Signed)
I have review CT imaging which showed acute left DVT I spoke with the patient's son Apart from leg swelling, she denies pain, chest pain or shortness of breath She has appointment to see me on Monday I discussed the importance of treatment of DVT I recommend starting her on Xarelto 15 mg twice a day and transitioning her to once a day treatment after 3 weeks I discussed the importance of watching for signs and symptoms of bleeding He is in agreement with the plan of care

## 2017-06-20 NOTE — Progress Notes (Signed)
  Echocardiogram 2D Echocardiogram has been performed.  Emily Duarte 06/20/2017, 11:07 AM

## 2017-06-23 ENCOUNTER — Other Ambulatory Visit: Payer: Self-pay | Admitting: Hematology and Oncology

## 2017-06-23 ENCOUNTER — Inpatient Hospital Stay (HOSPITAL_BASED_OUTPATIENT_CLINIC_OR_DEPARTMENT_OTHER): Payer: Medicare Other | Admitting: Hematology and Oncology

## 2017-06-23 ENCOUNTER — Other Ambulatory Visit: Payer: Self-pay

## 2017-06-23 ENCOUNTER — Inpatient Hospital Stay: Payer: Medicare Other

## 2017-06-23 ENCOUNTER — Telehealth: Payer: Self-pay | Admitting: Hematology and Oncology

## 2017-06-23 ENCOUNTER — Inpatient Hospital Stay: Payer: Medicare Other | Attending: Hematology and Oncology

## 2017-06-23 ENCOUNTER — Encounter: Payer: Self-pay | Admitting: Hematology and Oncology

## 2017-06-23 VITALS — BP 108/63 | HR 101 | Temp 98.2°F | Resp 18 | Ht 64.0 in | Wt 158.8 lb

## 2017-06-23 DIAGNOSIS — Z7901 Long term (current) use of anticoagulants: Secondary | ICD-10-CM | POA: Diagnosis not present

## 2017-06-23 DIAGNOSIS — Z5111 Encounter for antineoplastic chemotherapy: Secondary | ICD-10-CM | POA: Insufficient documentation

## 2017-06-23 DIAGNOSIS — D638 Anemia in other chronic diseases classified elsewhere: Secondary | ICD-10-CM

## 2017-06-23 DIAGNOSIS — N950 Postmenopausal bleeding: Secondary | ICD-10-CM | POA: Diagnosis not present

## 2017-06-23 DIAGNOSIS — N183 Chronic kidney disease, stage 3 (moderate): Secondary | ICD-10-CM | POA: Insufficient documentation

## 2017-06-23 DIAGNOSIS — D509 Iron deficiency anemia, unspecified: Secondary | ICD-10-CM | POA: Diagnosis not present

## 2017-06-23 DIAGNOSIS — E860 Dehydration: Secondary | ICD-10-CM

## 2017-06-23 DIAGNOSIS — C541 Malignant neoplasm of endometrium: Secondary | ICD-10-CM

## 2017-06-23 DIAGNOSIS — I82412 Acute embolism and thrombosis of left femoral vein: Secondary | ICD-10-CM

## 2017-06-23 DIAGNOSIS — R54 Age-related physical debility: Secondary | ICD-10-CM | POA: Diagnosis not present

## 2017-06-23 DIAGNOSIS — G893 Neoplasm related pain (acute) (chronic): Secondary | ICD-10-CM

## 2017-06-23 DIAGNOSIS — R531 Weakness: Secondary | ICD-10-CM

## 2017-06-23 DIAGNOSIS — E876 Hypokalemia: Secondary | ICD-10-CM | POA: Insufficient documentation

## 2017-06-23 DIAGNOSIS — N179 Acute kidney failure, unspecified: Secondary | ICD-10-CM

## 2017-06-23 DIAGNOSIS — K8020 Calculus of gallbladder without cholecystitis without obstruction: Secondary | ICD-10-CM | POA: Diagnosis not present

## 2017-06-23 DIAGNOSIS — D649 Anemia, unspecified: Secondary | ICD-10-CM

## 2017-06-23 DIAGNOSIS — Z79899 Other long term (current) drug therapy: Secondary | ICD-10-CM | POA: Insufficient documentation

## 2017-06-23 DIAGNOSIS — E440 Moderate protein-calorie malnutrition: Secondary | ICD-10-CM | POA: Diagnosis not present

## 2017-06-23 LAB — COMPREHENSIVE METABOLIC PANEL
ALT: 12 U/L (ref 0–55)
ANION GAP: 13 — AB (ref 3–11)
AST: 14 U/L (ref 5–34)
Albumin: 2 g/dL — ABNORMAL LOW (ref 3.5–5.0)
Alkaline Phosphatase: 120 U/L (ref 40–150)
BUN: 26 mg/dL (ref 7–26)
CALCIUM: 6.7 mg/dL — AB (ref 8.4–10.4)
CHLORIDE: 98 mmol/L (ref 98–109)
CO2: 26 mmol/L (ref 22–29)
Creatinine, Ser: 1.73 mg/dL — ABNORMAL HIGH (ref 0.60–1.10)
GFR calc non Af Amer: 29 mL/min — ABNORMAL LOW (ref >60)
GFR, EST AFRICAN AMERICAN: 34 mL/min — AB (ref >60)
Glucose, Bld: 98 mg/dL (ref 70–140)
Potassium: 3.1 mmol/L — ABNORMAL LOW (ref 3.5–5.1)
SODIUM: 137 mmol/L (ref 136–145)
Total Bilirubin: 0.3 mg/dL (ref 0.2–1.2)
Total Protein: 5.7 g/dL — ABNORMAL LOW (ref 6.4–8.3)

## 2017-06-23 LAB — CBC WITH DIFFERENTIAL/PLATELET
BASOS PCT: 1 %
Basophils Absolute: 0.1 10*3/uL (ref 0.0–0.1)
Eosinophils Absolute: 0 10*3/uL (ref 0.0–0.5)
Eosinophils Relative: 0 %
HCT: 21.2 % — ABNORMAL LOW (ref 34.8–46.6)
HEMOGLOBIN: 6.7 g/dL — AB (ref 11.6–15.9)
LYMPHS ABS: 2.4 10*3/uL (ref 0.9–3.3)
Lymphocytes Relative: 24 %
MCH: 28.9 pg (ref 25.1–34.0)
MCHC: 31.6 g/dL (ref 31.5–36.0)
MCV: 91.4 fL (ref 79.5–101.0)
MONOS PCT: 7 %
Monocytes Absolute: 0.7 10*3/uL (ref 0.1–0.9)
NEUTROS ABS: 6.7 10*3/uL — AB (ref 1.5–6.5)
Neutrophils Relative %: 68 %
Platelets: 307 10*3/uL (ref 145–400)
RBC: 2.32 MIL/uL — ABNORMAL LOW (ref 3.70–5.45)
RDW: 16.1 % — ABNORMAL HIGH (ref 11.2–14.5)
WBC: 9.8 10*3/uL (ref 3.9–10.3)

## 2017-06-23 LAB — SAMPLE TO BLOOD BANK

## 2017-06-23 LAB — ABO/RH: ABO/RH(D): O POS

## 2017-06-23 LAB — MAGNESIUM: MAGNESIUM: 0.8 mg/dL — AB (ref 1.5–2.5)

## 2017-06-23 LAB — PREPARE RBC (CROSSMATCH)

## 2017-06-23 MED ORDER — HYDROCODONE-ACETAMINOPHEN 10-325 MG PO TABS: 1.0000 | ORAL_TABLET | ORAL | 0 refills | Status: DC | PRN

## 2017-06-23 MED ORDER — HEPARIN SOD (PORK) LOCK FLUSH 100 UNIT/ML IV SOLN
500.0000 [IU] | Freq: Every day | INTRAVENOUS | Status: AC | PRN
  Administered 2017-06-23: 500 [IU]
  Filled 2017-06-23: qty 5

## 2017-06-23 MED ORDER — SODIUM CHLORIDE 0.9 % IV SOLN: 4.0000 g | Freq: Once | INTRAVENOUS | Status: DC

## 2017-06-23 MED ORDER — ALTEPLASE 2 MG IJ SOLR
2.0000 mg | Freq: Once | INTRAMUSCULAR | Status: AC
  Administered 2017-06-23: 2 mg
  Filled 2017-06-23: qty 2

## 2017-06-23 MED ORDER — MAGNESIUM SULFATE 4 GM/100ML IV SOLN
4.0000 g | Freq: Once | INTRAVENOUS | Status: DC
  Filled 2017-06-23: qty 100

## 2017-06-23 MED ORDER — SODIUM CHLORIDE 0.9% FLUSH
10.0000 mL | INTRAVENOUS | Status: AC | PRN
  Administered 2017-06-23: 10 mL
  Filled 2017-06-23: qty 10

## 2017-06-23 MED ORDER — ACETAMINOPHEN 325 MG PO TABS
ORAL_TABLET | ORAL | Status: AC
  Filled 2017-06-23: qty 2

## 2017-06-23 MED ORDER — DIPHENHYDRAMINE HCL 25 MG PO CAPS
ORAL_CAPSULE | ORAL | Status: AC
  Filled 2017-06-23: qty 1

## 2017-06-23 MED ORDER — ACETAMINOPHEN 325 MG PO TABS
650.0000 mg | ORAL_TABLET | Freq: Once | ORAL | Status: AC
  Administered 2017-06-23: 650 mg via ORAL

## 2017-06-23 MED ORDER — ALTEPLASE 2 MG IJ SOLR
INTRAMUSCULAR | Status: AC
  Filled 2017-06-23: qty 2

## 2017-06-23 MED ORDER — MAGNESIUM SULFATE 4 GM/100ML IV SOLN
4.0000 g | Freq: Once | INTRAVENOUS | Status: AC
Start: 2017-06-23 — End: 2017-06-23
  Administered 2017-06-23: 4 g via INTRAVENOUS
  Filled 2017-06-23: qty 100

## 2017-06-23 MED ORDER — PEGFILGRASTIM INJECTION 6 MG/0.6ML ~~LOC~~: 6.0000 mg | PREFILLED_SYRINGE | Freq: Once | SUBCUTANEOUS | Status: DC

## 2017-06-23 MED ORDER — SODIUM CHLORIDE 0.9 % IV SOLN
250.0000 mL | Freq: Once | INTRAVENOUS | Status: AC
  Administered 2017-06-23: 250 mL via INTRAVENOUS

## 2017-06-23 MED ORDER — DIPHENHYDRAMINE HCL 25 MG PO CAPS
25.0000 mg | ORAL_CAPSULE | Freq: Once | ORAL | Status: AC
  Administered 2017-06-23: 25 mg via ORAL

## 2017-06-23 MED ORDER — MAGNESIUM OXIDE 400 (241.3 MG) MG PO TABS: 400.0000 mg | ORAL_TABLET | Freq: Two times a day (BID) | ORAL | 11 refills | Status: DC

## 2017-06-23 NOTE — Patient Instructions (Signed)

## 2017-06-23 NOTE — Telephone Encounter (Signed)
Gave patient AVs and calendar of upcoming march and April appointments.  °

## 2017-06-23 NOTE — Assessment & Plan Note (Signed)
This is due to low magnesium level I would replace magnesium first

## 2017-06-23 NOTE — Assessment & Plan Note (Signed)
She has severe hypomagnesemia from chemotherapy I would give her IV magnesium along with oral magnesium supplement

## 2017-06-23 NOTE — Assessment & Plan Note (Signed)
She had recent left lower extremity DVT, likely exacerbated by poor mobility and cancer diagnosis She is currently prescribed Xarelto She is advised to watch out for signs and symptoms of bleeding I will adjust the dose of Xarelto based on her kidney function carefully

## 2017-06-23 NOTE — Progress Notes (Signed)
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[facility]) The patient is weak Thankfully, CT imaging show positive response to treatment Due to her frail status, I will change her treatment cycle to every 4 weeks We will resume cycle 4 treatment next week I will continue to see her every 2 weeks for supportive care  Cancer associated pain She has moderate cancer pain, currently well controlled She will continue taking hydrocodone to 10 mg every 4- 6 hours as needed for pain I warned her about risk of possible constipation  Left femoral vein DVT ([place]) She had recent left lower extremity DVT, likely exacerbated by poor mobility and cancer diagnosis She is currently prescribed Xarelto She is advised to watch out for signs and symptoms of bleeding I will adjust the dose of Xarelto based on her kidney function carefully  Anemia, chronic disease We discussed some of the risks, benefits, and alternatives of blood transfusions. The patient is symptomatic from anemia and the hemoglobin level is critically low.  Some of the side-effects to be expected including risks of transfusion reactions, chills, infection, syndrome of volume overload and risk of hospitalization from various reasons and the patient is willing to proceed and went ahead to sign consent today. We will proceed with 2 units of blood transfusion today  Hypomagnesemia She has severe hypomagnesemia from chemotherapy I would give her IV magnesium along with oral magnesium supplement  Hypokalemia This is due to low magnesium level I would replace magnesium first  Acute prerenal failure ([place]) This is likely due to severe dehydration and anemia We will monitor carefully I will bring her back next week for repeat blood draw and further follow-up   No orders of the defined types were placed in this  encounter.   INTERVAL HISTORY: Please see below for problem oriented charting. She returns with her son for further follow-up She was started on anticoagulation therapy last week for acute DVT Her leg swelling is stable The patient denies any recent signs or symptoms of bleeding such as spontaneous epistaxis, hematuria or hematochezia. She denies recent nausea, vomiting or constipation She denies peripheral neuropathy Denies pain Vaginal bleeding has stopped  SUMMARY OF ONCOLOGIC HISTORY: Oncology History   Focally ER positive MSI stable disease     Endometrial/uterine adenocarcinoma ([place])   08/01/2016 Initial Diagnosis    She was admitted to  [facility] on 08/01/16 with postmenopausal vaginal bleeding lasting 10 days. However, she was transferred to [facility] on 08/06/16 for suicidal ideation. There, GYN was consulted and ordered biopsy. Pt was transferred to medical unit for blood transfusion.       08/10/2016 Imaging    US pelvis:  The uterus is prominent and heterogeneous measuring 10.9 x 6.3 x 6.1 cm. There is a probable anterior exophytic fundal fibroid measuring 4.5 cm. The endometrial stripe is normal in thickness at 6.4 mm. I see no fluid in the canal  Right ovary is not visible. Left ovary 2.3 x 1.7 x 1.9 cm with normal echogenicity. Color flow and spectral Doppler: Normal arterial inflow and venous outflow.  There is no significant free fluid.   I see no adnexal masses.      08/11/2016 - 08/15/2016 Hospital Admission    She was admitted voluntarily to Psychiatry unit 08/06/2016 for suicidal ideation.She was evaluated in an outside facility due to vaginal bleeding and endometrial and  endocervical biopsies performed. Pathology revealed a poorly differentiated cancer with positive estrogen receptors felt to be suggestive of endometrial cancer. The patient received 2 units of prbcs total during her stay for anemia.        08/11/2016 Pathology  Results       1. Endocervix, curettings and polyp. (*please see comment below). --High-grade carcinoma, undifferentiated.  2. Endometrial biopsy:(*please see comment below). --High-grade carcinoma, undifferentiated. --Weakly positive for estrogen receptors suggestive of endometrial origin; Negative for progesterone receptors and neutral mucin..       08/14/2016 Imaging    CT imaging 1. Uterine mass and abnormality consistent with clinical history of endometrial carcinoma as described above with bilateral pelvic and retroperitoneal adenopathy consistent with nodal metastasis. 2. No evidence of nonnodal distant metastasis. 3. Nonspecific incidental finding of hypertrophy of the pylorus muscle. 4. incidental finding of cholelithiasis      09/04/2016 Imaging    CT chest: 1. 4 mm nodule in the anterior aspect of the right upper lobe. This is highly nonspecific. Although metastatic disease is not excluded, it is not strongly favored. Attention under routine followup examinations is recommended to ensure the stability or resolution of this finding. 2. Trace right pleural effusion lying dependently is simple in appearance. 3. Mild cardiomegaly.      09/05/2016 Procedure    Successful placement of a right internal jugular approach power injectable Port-A-Cath. The catheter is ready for immediate use.      09/05/2016 Tumor Marker    Patient's tumor was tested for the following markers: CA125 Results of the tumor marker test revealed 1150      09/09/2016 - 12/30/2016 Chemotherapy    She received chemotherapy with carboplatin and Taxol x 6 cycles      09/30/2016 Tumor Marker    Patient's tumor was tested for the following markers: CA125 Results of the tumor marker test revealed 338.1      10/28/2016 Tumor Marker    Patient's tumor was tested for the following markers: CA125 Results of the tumor marker test revealed 210.9      11/08/2016 Imaging    CT scan abdomen and pelvis 1. Soft tissue  nodularity throughout the omental and perihepatic fat worrisome for peritoneal carcinomatosis. No ascites. 2. No other definitive evidence of metastatic disease. There are prominent left pelvic sidewall and right inguinal lymph nodes. 3. Nonspecific findings in the uterus, likely related to treated tumor and fibroids. Prior pelvic imaging unavailable. 4. Cholelithiasis.      12/09/2016 Tumor Marker    Patient's tumor was tested for the following markers: CA125 Results of the tumor marker test revealed 98.1      12/30/2016 Tumor Marker    Patient's tumor was tested for the following markers: CA125 Results of the tumor marker test revealed 106.1      01/07/2017 Imaging    1. Stable soft tissue nodularity along the omentum suspicious for peritoneal spread of tumor. Stable appearance of nodularity along the uterine fundus and indistinctness along the right adnexa, some of this may be due to fibroids. 2. The previously borderline enlarged left external iliac lymph node is now normal in size. 3. Stable 4 mm ill-defined right upper lobe pulmonary nodule anteriorly, this may well in depth being benign but merits surveillance. 4. Cholelithiasis.      03/03/2017 Tumor Marker    Patient's tumor was tested for the following markers: CA125 Results of the tumor marker test revealed 694.5      03/07/2017 Imaging  1. Clear interval progression of peritoneal and omental disease as above. Diffuse mesenteric thickening and nodularity has progressed. 2. New juxta diaphragmatic lymph nodes compatible with metastatic disease. 3. Cholelithiasis. 4. Uterine enlargement with probable fibroid change. Ill-defined hypovascular lesion in the lower uterine segment/cervix may represent primary malignancy although this is difficult to assess by CT.      03/18/2017 Imaging    ECHO: Normal LV size with EF 55-60%. Normal RV size and systolic function. Mild MR.      06/06/2017 Tumor Marker    Patient's tumor was  tested for the following markers: CA125 Results of the tumor marker test revealed 710.5      06/20/2017 Imaging    1. Slightly improved omental disease. Scattered peritoneal surface disease and mesenteric disease appears relatively stable. 2. New cystic lesion peripherally in the right hepatic lobe could be treated peritoneal surface disease. 3. Persistent extensive endometrial neoplasm involving the lower uterine segment and cervix. 4. Cholelithiasis. 5. Moderate-sized right effusion with overlying atelectasis but no enhancing pleural nodules or pulmonary nodules. 6. Deep venous thrombosis in the left common femoral vein.      06/20/2017 Imaging    LV EF: 60% -  65%       REVIEW OF SYSTEMS:   Constitutional: Denies fevers, chills or abnormal weight loss Eyes: Denies blurriness of vision Ears, nose, mouth, throat, and face: Denies mucositis or sore throat Respiratory: Denies cough, dyspnea or wheezes Cardiovascular: Denies palpitation, chest discomfort or lower extremity swelling Gastrointestinal:  Denies nausea, heartburn or change in bowel habits Skin: Denies abnormal skin rashes Lymphatics: Denies new lymphadenopathy or easy bruising Neurological:Denies numbness, tingling or new weaknesses Behavioral/Psych: Mood is stable, no new changes  All other systems were reviewed with the patient and are negative.  I have reviewed the past medical history, past surgical history, social history and family history with the patient and they are unchanged from previous note.  ALLERGIES:  has No Known Allergies.  MEDICATIONS:  Current Outpatient Medications  Medication Sig Dispense Refill  . dexamethasone (DECADRON) 4 MG tablet Take 1 tablet (4 mg total) by mouth 2 (two) times daily with a meal. 60 tablet 1  . divalproex (DEPAKOTE ER) 500 MG 24 hr tablet Take 500 mg by mouth at bedtime.    Marland Kitchen HYDROcodone-acetaminophen (NORCO) 10-325 MG tablet Take 1 tablet by mouth every 4 (four) hours as  needed. 60 tablet 0  . magnesium oxide (MAG-OX) 400 (241.3 Mg) MG tablet Take 1 tablet (400 mg total) by mouth 2 (two) times daily. 60 tablet 11  . ondansetron (ZOFRAN) 8 MG tablet Take 1 tablet (8 mg total) by mouth every 8 (eight) hours as needed for nausea. 30 tablet 3  . potassium chloride SA (K-DUR,KLOR-CON) 20 MEQ tablet Take 1 tablet (20 mEq total) by mouth 2 (two) times daily. 28 tablet 0  . prochlorperazine (COMPAZINE) 10 MG tablet Take 1 tablet (10 mg total) by mouth every 6 (six) hours as needed for nausea or vomiting. 30 tablet 1  . Rivaroxaban 15 & 20 MG TBPK Take as directed on package: Start with one 37m tablet by mouth twice a day with food. On Day 22, switch to one 250mtablet once a day with food. 51 each 0   No current facility-administered medications for this visit.    Facility-Administered Medications Ordered in Other Visits  Medication Dose Route Frequency Provider Last Rate Last Dose  . heparin lock flush 100 unit/mL  500 Units Intracatheter Daily PRN GoAlvy Bimler  Margel Joens, MD      . magnesium sulfate IVPB 4 g 100 mL  4 g Intravenous Once Leora Platt, MD      . sodium chloride flush (NS) 0.9 % injection 10 mL  10 mL Intracatheter PRN Alvy Bimler, Serria Sloma, MD        PHYSICAL EXAMINATION: ECOG PERFORMANCE STATUS: 2 - Symptomatic, <50% confined to bed  Vitals:   06/23/17 1045  BP: 108/63  Pulse: (!) 101  Resp: 18  Temp: 98.2 F (36.8 C)  SpO2: 94%   Filed Weights   06/23/17 1045  Weight: 158 lb 12.8 oz (72 kg)    GENERAL:alert, no distress and comfortable.  She looks pale SKIN: skin color, texture, turgor are normal, no rashes or significant lesions EYES: normal, Conjunctiva are pink and non-injected, sclera clear OROPHARYNX:no exudate, no erythema and lips, buccal mucosa, and tongue normal  NECK: supple, thyroid normal size, non-tender, without nodularity LYMPH:  no palpable lymphadenopathy in the cervical, axillary or inguinal LUNGS: clear to auscultation and percussion  with normal breathing effort HEART: regular rate & rhythm and no murmurs with bilateral lower extremity edema ABDOMEN:abdomen soft, non-tender and normal bowel sounds Musculoskeletal:no cyanosis of digits and no clubbing  NEURO: alert & oriented x 3 with fluent speech, no focal motor/sensory deficits  LABORATORY DATA:  I have reviewed the data as listed    Component Value Date/Time   NA 137 06/23/2017 0937   NA 135 (L) 04/23/2017 1359   K 3.1 (L) 06/23/2017 0937   K 3.8 04/23/2017 1359   CL 98 06/23/2017 0937   CO2 26 06/23/2017 0937   CO2 27 04/23/2017 1359   GLUCOSE 98 06/23/2017 0937   GLUCOSE 98 04/23/2017 1359   BUN 26 06/23/2017 0937   BUN 10.8 04/23/2017 1359   CREATININE 1.73 (H) 06/23/2017 0937   CREATININE 0.8 04/23/2017 1359   CALCIUM 6.7 (L) 06/23/2017 0937   CALCIUM 8.6 04/23/2017 1359   PROT 5.7 (L) 06/23/2017 0937   PROT 7.5 04/23/2017 1359   ALBUMIN 2.0 (L) 06/23/2017 0937   ALBUMIN 2.6 (L) 04/23/2017 1359   AST 14 06/23/2017 0937   AST 38 (H) 04/23/2017 1359   ALT 12 06/23/2017 0937   ALT 22 04/23/2017 1359   ALKPHOS 120 06/23/2017 0937   ALKPHOS 117 04/23/2017 1359   BILITOT 0.3 06/23/2017 0937   BILITOT 0.62 04/23/2017 1359   GFRNONAA 29 (L) 06/23/2017 0937   GFRAA 34 (L) 06/23/2017 0937    No results found for: SPEP, UPEP  Lab Results  Component Value Date   WBC 9.8 06/23/2017   NEUTROABS 6.7 (H) 06/23/2017   HGB 6.7 (LL) 06/23/2017   HCT 21.2 (L) 06/23/2017   MCV 91.4 06/23/2017   PLT 307 06/23/2017      Chemistry      Component Value Date/Time   NA 137 06/23/2017 0937   NA 135 (L) 04/23/2017 1359   K 3.1 (L) 06/23/2017 0937   K 3.8 04/23/2017 1359   CL 98 06/23/2017 0937   CO2 26 06/23/2017 0937   CO2 27 04/23/2017 1359   BUN 26 06/23/2017 0937   BUN 10.8 04/23/2017 1359   CREATININE 1.73 (H) 06/23/2017 0937   CREATININE 0.8 04/23/2017 1359      Component Value Date/Time   CALCIUM 6.7 (L) 06/23/2017 0937   CALCIUM 8.6  04/23/2017 1359   ALKPHOS 120 06/23/2017 0937   ALKPHOS 117 04/23/2017 1359   AST 14 06/23/2017 0937   AST 38 (H)  04/23/2017 1359   ALT 12 06/23/2017 0937   ALT 22 04/23/2017 1359   BILITOT 0.3 06/23/2017 0937   BILITOT 0.62 04/23/2017 1359       RADIOGRAPHIC STUDIES: I have personally reviewed the radiological images as listed and agreed with the findings in the report. Ct Chest W Contrast  Result Date: 06/20/2017 CLINICAL DATA:  Endometrial carcinoma.  Restaging. EXAM: CT CHEST, ABDOMEN, AND PELVIS WITH CONTRAST TECHNIQUE: Multidetector CT imaging of the chest, abdomen and pelvis was performed following the standard protocol during bolus administration of intravenous contrast. CONTRAST:  36m ISOVUE-300 IOPAMIDOL (ISOVUE-300) INJECTION 61% COMPARISON:  03/07/2017 FINDINGS: CT CHEST FINDINGS Cardiovascular: The heart is normal in size. No pericardial effusion. Mild tortuosity of the thoracic aorta but no aneurysm or dissection. No atherosclerotic calcifications. No definite coronary artery calcifications. Mediastinum/Nodes: No mediastinal or hilar mass or adenopathy. Esophagus is grossly normal. Lungs/Pleura: Biapical pleural and parenchymal scarring changes. Moderate-sized right pleural effusion with overlying atelectasis. No pulmonary nodules are identified. Minimal left basilar atelectasis. Musculoskeletal: No breast masses, supraclavicular or axillary adenopathy. The thyroid gland appears normal. The bony thorax is intact. CT ABDOMEN PELVIS FINDINGS Hepatobiliary: There is a new 4.4 cm right hepatic lobe cyst. This could be the sequela of a treated peritoneal surface lesion. No intraparenchymal lesions are identified. Gallbladder is mildly distended and there are numerous gallstones. Hazy interstitial change around the gallbladder, around the second portion of duodenum and around the inferior aspect of the liver could all be due to peritoneal surface disease. Pancreas: No mass, inflammation or  ductal dilatation. Spleen: Normal size.  No focal lesions. Adrenals/Urinary Tract: The adrenal glands and kidneys are unremarkable and stable. The bladder appears normal. Stomach/Bowel: The stomach, duodenum, small bowel and colon are grossly normal. No obstructive findings or acute inflammatory process. Vascular/Lymphatic: The aorta and branch vessels are patent. The major venous structures are patent. Small scattered mesenteric and retroperitoneal lymph nodes appears stable. Small subdiaphragmatic nodes are stable. Interval improved CT appearance of the omental disease. The omental mass on the prior study measured 14.5 x 12.5 mm and now measures 9.9 x 8 mm on image number 67. The disease near the liver is also slightly smaller. This previously measured 4.2 x 1.5 cm and now measures 4.1 x 1.1 cm. Other patchy ill-defined omental and and mesenteric lesions appear stable. Reproductive: Extensive necrotic appearing endometrial tumor involving the lower uterine segment and cervix measures 7.0 x 3.3 cm on sagittal sequence image 191 and previously measured 6.0 x 4.5 cm. Uterine fibroids are also noted. There are cysts associated with both ovaries. Other: No free pelvic fluid collections are identified. No change in small pelvic sidewall lymph nodes. Thrombus noted in the left common femoral vein. Musculoskeletal: No significant bony findings. IMPRESSION: 1. Slightly improved omental disease. Scattered peritoneal surface disease and mesenteric disease appears relatively stable. 2. New cystic lesion peripherally in the right hepatic lobe could be treated peritoneal surface disease. 3. Persistent extensive endometrial neoplasm involving the lower uterine segment and cervix. 4. Cholelithiasis. 5. Moderate-sized right effusion with overlying atelectasis but no enhancing pleural nodules or pulmonary nodules. 6. Deep venous thrombosis in the left common femoral vein. These results will be called to the ordering clinician or  representative by the Radiologist Assistant, and communication documented in the PACS or zVision Dashboard. Electronically Signed   By: PMarijo SanesM.D.   On: 06/20/2017 13:46   Ct Abdomen Pelvis W Contrast  Result Date: 06/20/2017 CLINICAL DATA:  Endometrial carcinoma.  Restaging.  EXAM: CT CHEST, ABDOMEN, AND PELVIS WITH CONTRAST TECHNIQUE: Multidetector CT imaging of the chest, abdomen and pelvis was performed following the standard protocol during bolus administration of intravenous contrast. CONTRAST:  75m ISOVUE-300 IOPAMIDOL (ISOVUE-300) INJECTION 61% COMPARISON:  03/07/2017 FINDINGS: CT CHEST FINDINGS Cardiovascular: The heart is normal in size. No pericardial effusion. Mild tortuosity of the thoracic aorta but no aneurysm or dissection. No atherosclerotic calcifications. No definite coronary artery calcifications. Mediastinum/Nodes: No mediastinal or hilar mass or adenopathy. Esophagus is grossly normal. Lungs/Pleura: Biapical pleural and parenchymal scarring changes. Moderate-sized right pleural effusion with overlying atelectasis. No pulmonary nodules are identified. Minimal left basilar atelectasis. Musculoskeletal: No breast masses, supraclavicular or axillary adenopathy. The thyroid gland appears normal. The bony thorax is intact. CT ABDOMEN PELVIS FINDINGS Hepatobiliary: There is a new 4.4 cm right hepatic lobe cyst. This could be the sequela of a treated peritoneal surface lesion. No intraparenchymal lesions are identified. Gallbladder is mildly distended and there are numerous gallstones. Hazy interstitial change around the gallbladder, around the second portion of duodenum and around the inferior aspect of the liver could all be due to peritoneal surface disease. Pancreas: No mass, inflammation or ductal dilatation. Spleen: Normal size.  No focal lesions. Adrenals/Urinary Tract: The adrenal glands and kidneys are unremarkable and stable. The bladder appears normal. Stomach/Bowel: The stomach,  duodenum, small bowel and colon are grossly normal. No obstructive findings or acute inflammatory process. Vascular/Lymphatic: The aorta and branch vessels are patent. The major venous structures are patent. Small scattered mesenteric and retroperitoneal lymph nodes appears stable. Small subdiaphragmatic nodes are stable. Interval improved CT appearance of the omental disease. The omental mass on the prior study measured 14.5 x 12.5 mm and now measures 9.9 x 8 mm on image number 67. The disease near the liver is also slightly smaller. This previously measured 4.2 x 1.5 cm and now measures 4.1 x 1.1 cm. Other patchy ill-defined omental and and mesenteric lesions appear stable. Reproductive: Extensive necrotic appearing endometrial tumor involving the lower uterine segment and cervix measures 7.0 x 3.3 cm on sagittal sequence image 191 and previously measured 6.0 x 4.5 cm. Uterine fibroids are also noted. There are cysts associated with both ovaries. Other: No free pelvic fluid collections are identified. No change in small pelvic sidewall lymph nodes. Thrombus noted in the left common femoral vein. Musculoskeletal: No significant bony findings. IMPRESSION: 1. Slightly improved omental disease. Scattered peritoneal surface disease and mesenteric disease appears relatively stable. 2. New cystic lesion peripherally in the right hepatic lobe could be treated peritoneal surface disease. 3. Persistent extensive endometrial neoplasm involving the lower uterine segment and cervix. 4. Cholelithiasis. 5. Moderate-sized right effusion with overlying atelectasis but no enhancing pleural nodules or pulmonary nodules. 6. Deep venous thrombosis in the left common femoral vein. These results will be called to the ordering clinician or representative by the Radiologist Assistant, and communication documented in the PACS or zVision Dashboard. Electronically Signed   By: PMarijo SanesM.D.   On: 06/20/2017 13:46    All questions  were answered. The patient knows to call the clinic with any problems, questions or concerns. No barriers to learning was detected.  I spent 30 minutes counseling the patient face to face. The total time spent in the appointment was 55 minutes and more than 50% was on counseling and review of test results  NHeath Lark MD 06/23/2017 12:29 PM

## 2017-06-23 NOTE — Progress Notes (Signed)
IV placed for concurrent IVF Magnesium to run with blood transfusion per MD Alvy Bimler

## 2017-06-23 NOTE — Assessment & Plan Note (Signed)
We discussed some of the risks, benefits, and alternatives of blood transfusions. The patient is symptomatic from anemia and the hemoglobin level is critically low.  Some of the side-effects to be expected including risks of transfusion reactions, chills, infection, syndrome of volume overload and risk of hospitalization from various reasons and the patient is willing to proceed and went ahead to sign consent today. We will proceed with 2 units of blood transfusion today

## 2017-06-23 NOTE — Assessment & Plan Note (Signed)
She has moderate cancer pain, currently well controlled She will continue taking hydrocodone to 10 mg every 4- 6 hours as needed for pain I warned her about risk of possible constipation

## 2017-06-23 NOTE — Assessment & Plan Note (Signed)
The patient is weak Thankfully, CT imaging show positive response to treatment Due to her frail status, I will change her treatment cycle to every 4 weeks We will resume cycle 4 treatment next week I will continue to see her every 2 weeks for supportive care

## 2017-06-23 NOTE — Assessment & Plan Note (Signed)
This is likely due to severe dehydration and anemia We will monitor carefully I will bring her back next week for repeat blood draw and further follow-up

## 2017-06-24 LAB — TYPE AND SCREEN
ABO/RH(D): O POS
Antibody Screen: NEGATIVE
UNIT DIVISION: 0
UNIT DIVISION: 0

## 2017-06-24 LAB — BPAM RBC
BLOOD PRODUCT EXPIRATION DATE: 201903312359
Blood Product Expiration Date: 201903312359
ISSUE DATE / TIME: 201903041256
ISSUE DATE / TIME: 201903041256
UNIT TYPE AND RH: 5100
Unit Type and Rh: 5100

## 2017-06-30 ENCOUNTER — Other Ambulatory Visit: Payer: Self-pay | Admitting: Hematology and Oncology

## 2017-06-30 DIAGNOSIS — C541 Malignant neoplasm of endometrium: Secondary | ICD-10-CM

## 2017-07-01 ENCOUNTER — Ambulatory Visit (HOSPITAL_COMMUNITY)
Admission: RE | Admit: 2017-07-01 | Discharge: 2017-07-01 | Disposition: A | Discharge status: Home / Self Care | Payer: Medicare Other | Source: Ambulatory Visit | Attending: Hematology and Oncology | Admitting: Hematology and Oncology

## 2017-07-01 ENCOUNTER — Telehealth: Payer: Self-pay | Admitting: *Deleted

## 2017-07-01 ENCOUNTER — Inpatient Hospital Stay: Payer: Medicare Other

## 2017-07-01 ENCOUNTER — Encounter: Payer: Self-pay | Admitting: Hematology and Oncology

## 2017-07-01 ENCOUNTER — Encounter: Payer: Self-pay | Admitting: *Deleted

## 2017-07-01 ENCOUNTER — Other Ambulatory Visit: Payer: Self-pay | Admitting: Hematology and Oncology

## 2017-07-01 ENCOUNTER — Inpatient Hospital Stay (HOSPITAL_BASED_OUTPATIENT_CLINIC_OR_DEPARTMENT_OTHER): Payer: Medicare Other | Admitting: Hematology and Oncology

## 2017-07-01 DIAGNOSIS — N179 Acute kidney failure, unspecified: Secondary | ICD-10-CM | POA: Diagnosis not present

## 2017-07-01 DIAGNOSIS — Z5111 Encounter for antineoplastic chemotherapy: Secondary | ICD-10-CM | POA: Diagnosis not present

## 2017-07-01 DIAGNOSIS — D638 Anemia in other chronic diseases classified elsewhere: Secondary | ICD-10-CM

## 2017-07-01 DIAGNOSIS — C541 Malignant neoplasm of endometrium: Secondary | ICD-10-CM

## 2017-07-01 DIAGNOSIS — D631 Anemia in chronic kidney disease: Secondary | ICD-10-CM | POA: Diagnosis not present

## 2017-07-01 DIAGNOSIS — E876 Hypokalemia: Secondary | ICD-10-CM

## 2017-07-01 DIAGNOSIS — E440 Moderate protein-calorie malnutrition: Secondary | ICD-10-CM

## 2017-07-01 DIAGNOSIS — E860 Dehydration: Secondary | ICD-10-CM | POA: Diagnosis not present

## 2017-07-01 DIAGNOSIS — I82412 Acute embolism and thrombosis of left femoral vein: Secondary | ICD-10-CM

## 2017-07-01 LAB — COMPREHENSIVE METABOLIC PANEL
ALT: 10 U/L (ref 0–55)
AST: 15 U/L (ref 5–34)
Albumin: 2.1 g/dL — ABNORMAL LOW (ref 3.5–5.0)
Alkaline Phosphatase: 127 U/L (ref 40–150)
Anion gap: 11 (ref 3–11)
BILIRUBIN TOTAL: 0.4 mg/dL (ref 0.2–1.2)
BUN: 28 mg/dL — AB (ref 7–26)
CHLORIDE: 98 mmol/L (ref 98–109)
CO2: 28 mmol/L (ref 22–29)
CREATININE: 1.59 mg/dL — AB (ref 0.60–1.10)
Calcium: 8.3 mg/dL — ABNORMAL LOW (ref 8.4–10.4)
GFR calc non Af Amer: 32 mL/min — ABNORMAL LOW (ref >60)
GFR, EST AFRICAN AMERICAN: 37 mL/min — AB (ref >60)
Glucose, Bld: 74 mg/dL (ref 70–140)
POTASSIUM: 3.9 mmol/L (ref 3.5–5.1)
Sodium: 137 mmol/L (ref 136–145)
TOTAL PROTEIN: 6.7 g/dL (ref 6.4–8.3)

## 2017-07-01 LAB — CBC WITH DIFFERENTIAL/PLATELET
Basophils Absolute: 0 10*3/uL (ref 0.0–0.1)
Basophils Relative: 0 %
EOS PCT: 0 %
Eosinophils Absolute: 0 10*3/uL (ref 0.0–0.5)
HEMATOCRIT: 31.9 % — AB (ref 34.8–46.6)
Hemoglobin: 10.5 g/dL — ABNORMAL LOW (ref 11.6–15.9)
LYMPHS ABS: 3 10*3/uL (ref 0.9–3.3)
LYMPHS PCT: 19 %
MCH: 29.7 pg (ref 25.1–34.0)
MCHC: 32.8 g/dL (ref 31.5–36.0)
MCV: 90.6 fL (ref 79.5–101.0)
MONO ABS: 1.3 10*3/uL — AB (ref 0.1–0.9)
Monocytes Relative: 9 %
NEUTROS ABS: 11.1 10*3/uL — AB (ref 1.5–6.5)
Neutrophils Relative %: 72 %
PLATELETS: 450 10*3/uL — AB (ref 145–400)
RBC: 3.52 MIL/uL — ABNORMAL LOW (ref 3.70–5.45)
RDW: 16 % — AB (ref 11.2–14.5)
WBC: 15.5 10*3/uL — ABNORMAL HIGH (ref 3.9–10.3)

## 2017-07-01 LAB — SAMPLE TO BLOOD BANK

## 2017-07-01 LAB — MAGNESIUM: MAGNESIUM: 0.9 mg/dL — AB (ref 1.7–2.4)

## 2017-07-01 MED ORDER — HEPARIN SOD (PORK) LOCK FLUSH 100 UNIT/ML IV SOLN
500.0000 [IU] | INTRAVENOUS | Status: AC | PRN
  Administered 2017-07-01: 500 [IU]
  Filled 2017-07-01: qty 5

## 2017-07-01 MED ORDER — SODIUM CHLORIDE 0.9% FLUSH
10.0000 mL | Freq: Once | INTRAVENOUS | Status: AC
  Administered 2017-07-01: 10 mL
  Filled 2017-07-01: qty 10

## 2017-07-01 MED ORDER — SODIUM CHLORIDE 0.9% FLUSH: 10.0000 mL | INTRAVENOUS | Status: DC | PRN

## 2017-07-01 MED ORDER — SODIUM CHLORIDE 0.9 % IV SOLN
Freq: Once | INTRAVENOUS | Status: AC
  Administered 2017-07-01: 10:00:00 via INTRAVENOUS

## 2017-07-01 NOTE — Progress Notes (Signed)
PATIENT CARE CENTER NOTE   Provider: Dr. Alvy Bimler   Procedure: 1 Liter Bolus 0.9% Normal Saline   Note: Patient received 1 Liter Bolus of 0.9% Normal Saline over two hours. Patient tolerated infusion well with no adverse reaction. Discharge instructions given to patient. Patient alert, oriented and transported in wheelchair with help from son.

## 2017-07-01 NOTE — Discharge Instructions (Signed)
Today you received an bolus (infusion) of 0.9% Normal Saline fluids over 2 hours.

## 2017-07-01 NOTE — Telephone Encounter (Signed)
Freddy notified to increase Mrs Heese's magnesium to 3 times per day.  Patient Emily Duarte is not able to administer IV Magnesium.

## 2017-07-01 NOTE — Assessment & Plan Note (Signed)
The patient is weak Thankfully, recent CT imaging showed positive response to treatment Due to her frail status, I will change her treatment cycle to every 4 weeks She has lost a lot of weight.  I will adjust the dose of chemotherapy accordingly and plan to further reduce the dose of cisplatin due to recent renal failure and low magnesium I will continue to see her every 2 weeks for supportive care

## 2017-07-01 NOTE — Assessment & Plan Note (Signed)
She will continue on dexamethasone to control nausea I encouraged her to increase oral intake as tolerated

## 2017-07-01 NOTE — Progress Notes (Unsigned)
CRITICAL MAGNESIUM 0.9 GIVEN TO [name](RN) BY MKERR (MT) AT 1010AM 07/01/17

## 2017-07-01 NOTE — Assessment & Plan Note (Signed)
She is clinically dehydrated I recommend we proceed with IV fluid resuscitation and I reinforced the importance of her taking more oral fluid intake

## 2017-07-01 NOTE — Progress Notes (Signed)
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  I will adjust the dose of chemotherapy accordingly and plan to further reduce the dose of cisplatin due to recent renal failure and low magnesium I will continue to see her every 2 weeks for supportive care  Acute prerenal failure ([place]) She is clinically dehydrated I recommend we proceed with IV fluid resuscitation and I reinforced the importance of her taking more oral fluid intake  Anemia, chronic disease She was recently transfused She does not need blood transfusion I plan to reduce the dose of cisplatin further due to her recent severe anemia.  Hypomagnesemia She has severe hypomagnesemia She is only taking magnesium supplement once a day and just started this week I reinforced the importance of regular magnesium intake and I plan to increase it to 3 times a day  Protein-calorie malnutrition, moderate ([place]) She will continue on dexamethasone to control nausea I encouraged her to increase oral intake as tolerated  Left femoral vein DVT ([facility]) Her left leg swelling is improving She will continue anticoagulation therapy as directed We will monitor her kidney function carefully   No orders of the defined types were placed in this encounter.   INTERVAL HISTORY: Please see below for problem oriented charting. She returns with her son for further follow-up Since the last time I saw her, she had further weight loss Her oral intake remained poor Leg swelling is improving She denies pain She denies peripheral neuropathy She denies recent infection, fever or chills No recent nausea, constipation or  diarrhea She denies recent vaginal bleeding The patient denies any recent signs or symptoms of bleeding such as spontaneous epistaxis, hematuria or hematochezia.  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 However, she was transferred to [facility] on 08/06/16 for suicidal ideation. There, GYN was consulted and ordered biopsy. Pt was transferred to medical unit for blood transfusion.       08/10/2016 Imaging    US pelvis:  The uterus is prominent and heterogeneous measuring 10.9 x 6.3 x 6.1 cm. There is a probable anterior exophytic fundal fibroid measuring 4.5 cm. The endometrial stripe is normal in thickness at 6.4 mm. I see no fluid in the canal  Right ovary is not visible. Left ovary 2.3 x 1.7 x 1.9 cm with normal echogenicity. Color flow and spectral Doppler: Normal arterial inflow and venous outflow.  There is no significant free fluid.   I see no adnexal masses.      08/11/2016 - 08/15/2016 Hospital Admission    She was admitted voluntarily to Psychiatry unit 08/06/2016 for suicidal ideation.She was evaluated in an outside facility due to vaginal bleeding and endometrial and endocervical biopsies performed. Pathology revealed a poorly differentiated cancer with positive estrogen receptors felt to be suggestive of endometrial cancer. The patient received 2 units of prbcs total during her stay for anemia.        08/11/2016 Pathology Results  1. Endocervix, curettings and polyp. (*please see comment below). --High-grade carcinoma, undifferentiated.  2. Endometrial biopsy:(*please see comment below). --High-grade carcinoma, undifferentiated. --Weakly positive for estrogen receptors suggestive of endometrial origin; Negative for  progesterone receptors and neutral mucin..       08/14/2016 Imaging    CT imaging 1. Uterine mass and abnormality consistent with clinical history of endometrial carcinoma as described above with bilateral pelvic and retroperitoneal adenopathy consistent with nodal metastasis. 2. No evidence of nonnodal distant metastasis. 3. Nonspecific incidental finding of hypertrophy of the pylorus muscle. 4. incidental finding of cholelithiasis      09/04/2016 Imaging    CT chest: 1. 4 mm nodule in the anterior aspect of the right upper lobe. This is highly nonspecific. Although metastatic disease is not excluded, it is not strongly favored. Attention under routine followup examinations is recommended to ensure the stability or resolution of this finding. 2. Trace right pleural effusion lying dependently is simple in appearance. 3. Mild cardiomegaly.      09/05/2016 Procedure    Successful placement of a right internal jugular approach power injectable Port-A-Cath. The catheter is ready for immediate use.      09/05/2016 Tumor Marker    Patient's tumor was tested for the following markers: CA125 Results of the tumor marker test revealed 1150      09/09/2016 - 12/30/2016 Chemotherapy    She received chemotherapy with carboplatin and Taxol x 6 cycles      09/30/2016 Tumor Marker    Patient's tumor was tested for the following markers: CA125 Results of the tumor marker test revealed 338.1      10/28/2016 Tumor Marker    Patient's tumor was tested for the following markers: CA125 Results of the tumor marker test revealed 210.9      11/08/2016 Imaging    CT scan abdomen and pelvis 1. Soft tissue nodularity throughout the omental and perihepatic fat worrisome for peritoneal carcinomatosis. No ascites. 2. No other definitive evidence of metastatic disease. There are prominent left pelvic sidewall and right inguinal lymph nodes. 3. Nonspecific findings in the uterus, likely related to treated tumor and  fibroids. Prior pelvic imaging unavailable. 4. Cholelithiasis.      12/09/2016 Tumor Marker    Patient's tumor was tested for the following markers: CA125 Results of the tumor marker test revealed 98.1      12/30/2016 Tumor Marker    Patient's tumor was tested for the following markers: CA125 Results of the tumor marker test revealed 106.1      01/07/2017 Imaging    1. Stable soft tissue nodularity along the omentum suspicious for peritoneal spread of tumor. Stable appearance of nodularity along the uterine fundus and indistinctness along the right adnexa, some of this may be due to fibroids. 2. The previously borderline enlarged left external iliac lymph node is now normal in size. 3. Stable 4 mm ill-defined right upper lobe pulmonary nodule anteriorly, this may well in depth being benign but merits surveillance. 4. Cholelithiasis.      03/03/2017 Tumor Marker    Patient's tumor was tested for the following markers: CA125 Results of the tumor marker test revealed 694.5      03/07/2017 Imaging    1. Clear interval progression of peritoneal and omental disease as above. Diffuse mesenteric thickening and nodularity has progressed. 2. New juxta diaphragmatic lymph nodes compatible with metastatic disease. 3. Cholelithiasis. 4. Uterine enlargement with probable fibroid change. Ill-defined hypovascular lesion in the lower uterine segment/cervix may represent primary malignancy  although this is difficult to assess by CT.      03/18/2017 Imaging    ECHO: Normal LV size with EF 55-60%. Normal RV size and systolic function. Mild MR.      06/06/2017 Tumor Marker    Patient's tumor was tested for the following markers: CA125 Results of the tumor marker test revealed 710.5      06/20/2017 Imaging    1. Slightly improved omental disease. Scattered peritoneal surface disease and mesenteric disease appears relatively stable. 2. New cystic lesion peripherally in the right hepatic lobe could be  treated peritoneal surface disease. 3. Persistent extensive endometrial neoplasm involving the lower uterine segment and cervix. 4. Cholelithiasis. 5. Moderate-sized right effusion with overlying atelectasis but no enhancing pleural nodules or pulmonary nodules. 6. Deep venous thrombosis in the left common femoral vein.      06/20/2017 Imaging    LV EF: 60% -  65%       REVIEW OF SYSTEMS:   Constitutional: Denies fevers, chills  Eyes: Denies blurriness of vision Ears, nose, mouth, throat, and face: Denies mucositis or sore throat Respiratory: Denies cough, dyspnea or wheezes Cardiovascular: Denies palpitation, chest discomfort  Gastrointestinal:  Denies nausea, heartburn or change in bowel habits Skin: Denies abnormal skin rashes Lymphatics: Denies new lymphadenopathy or easy bruising Neurological:Denies numbness, tingling or new weaknesses Behavioral/Psych: Mood is stable, no new changes  All other systems were reviewed with the patient and are negative.  I have reviewed the past medical history, past surgical history, social history and family history with the patient and they are unchanged from previous note.  ALLERGIES:  has No Known Allergies.  MEDICATIONS:  Current Outpatient Medications  Medication Sig Dispense Refill  . dexamethasone (DECADRON) 4 MG tablet Take 1 tablet (4 mg total) by mouth 2 (two) times daily with a meal. 60 tablet 1  . divalproex (DEPAKOTE ER) 500 MG 24 hr tablet Take 500 mg by mouth at bedtime.    Marland Kitchen HYDROcodone-acetaminophen (NORCO) 10-325 MG tablet Take 1 tablet by mouth every 4 (four) hours as needed. 60 tablet 0  . magnesium oxide (MAG-OX) 400 (241.3 Mg) MG tablet Take 1 tablet (400 mg total) by mouth 2 (two) times daily. 60 tablet 11  . ondansetron (ZOFRAN) 8 MG tablet Take 1 tablet (8 mg total) by mouth every 8 (eight) hours as needed for nausea. 30 tablet 3  . potassium chloride SA (K-DUR,KLOR-CON) 20 MEQ tablet Take 1 tablet (20 mEq total) by  mouth 2 (two) times daily. 28 tablet 0  . prochlorperazine (COMPAZINE) 10 MG tablet Take 1 tablet (10 mg total) by mouth every 6 (six) hours as needed for nausea or vomiting. 30 tablet 1  . Rivaroxaban 15 & 20 MG TBPK Take as directed on package: Start with one 65m tablet by mouth twice a day with food. On Day 22, switch to one 253mtablet once a day with food. 51 each 0   No current facility-administered medications for this visit.    Facility-Administered Medications Ordered in Other Visits  Medication Dose Route Frequency Provider Last Rate Last Dose  . magnesium sulfate IVPB 4 g 100 mL  4 g Intravenous Once Delmore Sear, MD      . sodium chloride flush (NS) 0.9 % injection 10 mL  10 mL Intracatheter PRN GoAlvy BimlerNi, MD        PHYSICAL EXAMINATION: ECOG PERFORMANCE STATUS: 2 - Symptomatic, <50% confined to bed  Vitals:   07/01/17 0854  BP: 131/83  Pulse: (!) 114  Resp: 18  Temp: (!) 97.5 F (36.4 C)  SpO2: 97%   Filed Weights   07/01/17 0854  Weight: 151 lb (68.5 kg)    GENERAL:alert, no distress and comfortable.  She looks thinner, frail and weak SKIN: skin color, texture, turgor are normal, no rashes or significant lesions EYES: normal, Conjunctiva are pale and non-injected, sclera clear OROPHARYNX:no exudate, no erythema and lips, buccal mucosa, and tongue normal  NECK: supple, thyroid normal size, non-tender, without nodularity LYMPH:  no palpable lymphadenopathy in the cervical, axillary or inguinal LUNGS: clear to auscultation and percussion with normal breathing effort HEART: Tachycardia, no murmurs with mild bilateral lower extremity edema ABDOMEN:abdomen soft, non-tender and normal bowel sounds Musculoskeletal:no cyanosis of digits and no clubbing  NEURO: alert & oriented x 3 with fluent speech, no focal motor/sensory deficits  LABORATORY DATA:  I have reviewed the data as listed    Component Value Date/Time   NA 137 07/01/2017 0833   NA 135 (L) 04/23/2017  1359   K 3.9 07/01/2017 0833   K 3.8 04/23/2017 1359   CL 98 07/01/2017 0833   CO2 28 07/01/2017 0833   CO2 27 04/23/2017 1359   GLUCOSE 74 07/01/2017 0833   GLUCOSE 98 04/23/2017 1359   BUN 28 (H) 07/01/2017 0833   BUN 10.8 04/23/2017 1359   CREATININE 1.59 (H) 07/01/2017 0833   CREATININE 0.8 04/23/2017 1359   CALCIUM 8.3 (L) 07/01/2017 0833   CALCIUM 8.6 04/23/2017 1359   PROT 6.7 07/01/2017 0833   PROT 7.5 04/23/2017 1359   ALBUMIN 2.1 (L) 07/01/2017 0833   ALBUMIN 2.6 (L) 04/23/2017 1359   AST 15 07/01/2017 0833   AST 38 (H) 04/23/2017 1359   ALT 10 07/01/2017 0833   ALT 22 04/23/2017 1359   ALKPHOS 127 07/01/2017 0833   ALKPHOS 117 04/23/2017 1359   BILITOT 0.4 07/01/2017 0833   BILITOT 0.62 04/23/2017 1359   GFRNONAA 32 (L) 07/01/2017 0833   GFRAA 37 (L) 07/01/2017 0833    No results found for: SPEP, UPEP  Lab Results  Component Value Date   WBC 15.5 (H) 07/01/2017   NEUTROABS 11.1 (H) 07/01/2017   HGB 10.5 (L) 07/01/2017   HCT 31.9 (L) 07/01/2017   MCV 90.6 07/01/2017   PLT 450 (H) 07/01/2017      Chemistry      Component Value Date/Time   NA 137 07/01/2017 0833   NA 135 (L) 04/23/2017 1359   K 3.9 07/01/2017 0833   K 3.8 04/23/2017 1359   CL 98 07/01/2017 0833   CO2 28 07/01/2017 0833   CO2 27 04/23/2017 1359   BUN 28 (H) 07/01/2017 0833   BUN 10.8 04/23/2017 1359   CREATININE 1.59 (H) 07/01/2017 0833   CREATININE 0.8 04/23/2017 1359      Component Value Date/Time   CALCIUM 8.3 (L) 07/01/2017 0833   CALCIUM 8.6 04/23/2017 1359   ALKPHOS 127 07/01/2017 0833   ALKPHOS 117 04/23/2017 1359   AST 15 07/01/2017 0833   AST 38 (H) 04/23/2017 1359   ALT 10 07/01/2017 0833   ALT 22 04/23/2017 1359   BILITOT 0.4 07/01/2017 0833   BILITOT 0.62 04/23/2017 1359       RADIOGRAPHIC STUDIES: I have personally reviewed the radiological images as listed and agreed with the findings in the report. Ct Chest W Contrast  Result Date: 06/20/2017 CLINICAL  DATA:  Endometrial carcinoma.  Restaging. EXAM: CT CHEST, ABDOMEN, AND PELVIS WITH CONTRAST TECHNIQUE:  Multidetector CT imaging of the chest, abdomen and pelvis was performed following the standard protocol during bolus administration of intravenous contrast. CONTRAST:  47m ISOVUE-300 IOPAMIDOL (ISOVUE-300) INJECTION 61% COMPARISON:  03/07/2017 FINDINGS: CT CHEST FINDINGS Cardiovascular: The heart is normal in size. No pericardial effusion. Mild tortuosity of the thoracic aorta but no aneurysm or dissection. No atherosclerotic calcifications. No definite coronary artery calcifications. Mediastinum/Nodes: No mediastinal or hilar mass or adenopathy. Esophagus is grossly normal. Lungs/Pleura: Biapical pleural and parenchymal scarring changes. Moderate-sized right pleural effusion with overlying atelectasis. No pulmonary nodules are identified. Minimal left basilar atelectasis. Musculoskeletal: No breast masses, supraclavicular or axillary adenopathy. The thyroid gland appears normal. The bony thorax is intact. CT ABDOMEN PELVIS FINDINGS Hepatobiliary: There is a new 4.4 cm right hepatic lobe cyst. This could be the sequela of a treated peritoneal surface lesion. No intraparenchymal lesions are identified. Gallbladder is mildly distended and there are numerous gallstones. Hazy interstitial change around the gallbladder, around the second portion of duodenum and around the inferior aspect of the liver could all be due to peritoneal surface disease. Pancreas: No mass, inflammation or ductal dilatation. Spleen: Normal size.  No focal lesions. Adrenals/Urinary Tract: The adrenal glands and kidneys are unremarkable and stable. The bladder appears normal. Stomach/Bowel: The stomach, duodenum, small bowel and colon are grossly normal. No obstructive findings or acute inflammatory process. Vascular/Lymphatic: The aorta and branch vessels are patent. The major venous structures are patent. Small scattered mesenteric and  retroperitoneal lymph nodes appears stable. Small subdiaphragmatic nodes are stable. Interval improved CT appearance of the omental disease. The omental mass on the prior study measured 14.5 x 12.5 mm and now measures 9.9 x 8 mm on image number 67. The disease near the liver is also slightly smaller. This previously measured 4.2 x 1.5 cm and now measures 4.1 x 1.1 cm. Other patchy ill-defined omental and and mesenteric lesions appear stable. Reproductive: Extensive necrotic appearing endometrial tumor involving the lower uterine segment and cervix measures 7.0 x 3.3 cm on sagittal sequence image 191 and previously measured 6.0 x 4.5 cm. Uterine fibroids are also noted. There are cysts associated with both ovaries. Other: No free pelvic fluid collections are identified. No change in small pelvic sidewall lymph nodes. Thrombus noted in the left common femoral vein. Musculoskeletal: No significant bony findings. IMPRESSION: 1. Slightly improved omental disease. Scattered peritoneal surface disease and mesenteric disease appears relatively stable. 2. New cystic lesion peripherally in the right hepatic lobe could be treated peritoneal surface disease. 3. Persistent extensive endometrial neoplasm involving the lower uterine segment and cervix. 4. Cholelithiasis. 5. Moderate-sized right effusion with overlying atelectasis but no enhancing pleural nodules or pulmonary nodules. 6. Deep venous thrombosis in the left common femoral vein. These results will be called to the ordering clinician or representative by the Radiologist Assistant, and communication documented in the PACS or zVision Dashboard. Electronically Signed   By: PMarijo SanesM.D.   On: 06/20/2017 13:46   Ct Abdomen Pelvis W Contrast  Result Date: 06/20/2017 CLINICAL DATA:  Endometrial carcinoma.  Restaging. EXAM: CT CHEST, ABDOMEN, AND PELVIS WITH CONTRAST TECHNIQUE: Multidetector CT imaging of the chest, abdomen and pelvis was performed following the  standard protocol during bolus administration of intravenous contrast. CONTRAST:  778mISOVUE-300 IOPAMIDOL (ISOVUE-300) INJECTION 61% COMPARISON:  03/07/2017 FINDINGS: CT CHEST FINDINGS Cardiovascular: The heart is normal in size. No pericardial effusion. Mild tortuosity of the thoracic aorta but no aneurysm or dissection. No atherosclerotic calcifications. No definite coronary artery calcifications. Mediastinum/Nodes:  No mediastinal or hilar mass or adenopathy. Esophagus is grossly normal. Lungs/Pleura: Biapical pleural and parenchymal scarring changes. Moderate-sized right pleural effusion with overlying atelectasis. No pulmonary nodules are identified. Minimal left basilar atelectasis. Musculoskeletal: No breast masses, supraclavicular or axillary adenopathy. The thyroid gland appears normal. The bony thorax is intact. CT ABDOMEN PELVIS FINDINGS Hepatobiliary: There is a new 4.4 cm right hepatic lobe cyst. This could be the sequela of a treated peritoneal surface lesion. No intraparenchymal lesions are identified. Gallbladder is mildly distended and there are numerous gallstones. Hazy interstitial change around the gallbladder, around the second portion of duodenum and around the inferior aspect of the liver could all be due to peritoneal surface disease. Pancreas: No mass, inflammation or ductal dilatation. Spleen: Normal size.  No focal lesions. Adrenals/Urinary Tract: The adrenal glands and kidneys are unremarkable and stable. The bladder appears normal. Stomach/Bowel: The stomach, duodenum, small bowel and colon are grossly normal. No obstructive findings or acute inflammatory process. Vascular/Lymphatic: The aorta and branch vessels are patent. The major venous structures are patent. Small scattered mesenteric and retroperitoneal lymph nodes appears stable. Small subdiaphragmatic nodes are stable. Interval improved CT appearance of the omental disease. The omental mass on the prior study measured 14.5 x  12.5 mm and now measures 9.9 x 8 mm on image number 67. The disease near the liver is also slightly smaller. This previously measured 4.2 x 1.5 cm and now measures 4.1 x 1.1 cm. Other patchy ill-defined omental and and mesenteric lesions appear stable. Reproductive: Extensive necrotic appearing endometrial tumor involving the lower uterine segment and cervix measures 7.0 x 3.3 cm on sagittal sequence image 191 and previously measured 6.0 x 4.5 cm. Uterine fibroids are also noted. There are cysts associated with both ovaries. Other: No free pelvic fluid collections are identified. No change in small pelvic sidewall lymph nodes. Thrombus noted in the left common femoral vein. Musculoskeletal: No significant bony findings. IMPRESSION: 1. Slightly improved omental disease. Scattered peritoneal surface disease and mesenteric disease appears relatively stable. 2. New cystic lesion peripherally in the right hepatic lobe could be treated peritoneal surface disease. 3. Persistent extensive endometrial neoplasm involving the lower uterine segment and cervix. 4. Cholelithiasis. 5. Moderate-sized right effusion with overlying atelectasis but no enhancing pleural nodules or pulmonary nodules. 6. Deep venous thrombosis in the left common femoral vein. These results will be called to the ordering clinician or representative by the Radiologist Assistant, and communication documented in the PACS or zVision Dashboard. Electronically Signed   By: Marijo Sanes M.D.   On: 06/20/2017 13:46    All questions were answered. The patient knows to call the clinic with any problems, questions or concerns. No barriers to learning was detected.  I spent 40 minutes counseling the patient face to face. The total time spent in the appointment was 55 minutes and more than 50% was on counseling and review of test results  Heath Lark, MD 07/01/2017 1:31 PM

## 2017-07-01 NOTE — Progress Notes (Signed)
Received call from [place], Dr. Calton Dach RN, with verbal order to given 1 liter bolus over 2 hours.  Patient no longer required blood transfusion today due to recent lab values.

## 2017-07-01 NOTE — Assessment & Plan Note (Signed)
She was recently transfused She does not need blood transfusion I plan to reduce the dose of cisplatin further due to her recent severe anemia.

## 2017-07-01 NOTE — Assessment & Plan Note (Signed)
She has severe hypomagnesemia She is only taking magnesium supplement once a day and just started this week I reinforced the importance of regular magnesium intake and I plan to increase it to 3 times a day

## 2017-07-01 NOTE — Assessment & Plan Note (Signed)
Her left leg swelling is improving She will continue anticoagulation therapy as directed We will monitor her kidney function carefully

## 2017-07-02 ENCOUNTER — Other Ambulatory Visit: Payer: Self-pay | Admitting: Hematology and Oncology

## 2017-07-02 LAB — CA 125: CANCER ANTIGEN (CA) 125: 493.4 U/mL — AB (ref 0.0–38.1)

## 2017-07-04 ENCOUNTER — Other Ambulatory Visit: Payer: Self-pay

## 2017-07-04 ENCOUNTER — Ambulatory Visit: Payer: Self-pay | Admitting: Hematology and Oncology

## 2017-07-04 ENCOUNTER — Inpatient Hospital Stay: Payer: Medicare Other

## 2017-07-04 DIAGNOSIS — C541 Malignant neoplasm of endometrium: Secondary | ICD-10-CM

## 2017-07-04 DIAGNOSIS — Z5111 Encounter for antineoplastic chemotherapy: Secondary | ICD-10-CM | POA: Diagnosis not present

## 2017-07-04 MED ORDER — DOXORUBICIN HCL CHEMO IV INJECTION 2 MG/ML
40.0000 mg/m2 | Freq: Once | INTRAVENOUS | Status: AC
  Administered 2017-07-04: 66 mg via INTRAVENOUS
  Filled 2017-07-04: qty 33

## 2017-07-04 MED ORDER — PALONOSETRON HCL INJECTION 0.25 MG/5ML
0.2500 mg | Freq: Once | INTRAVENOUS | Status: AC
  Administered 2017-07-04: 0.25 mg via INTRAVENOUS

## 2017-07-04 MED ORDER — PALONOSETRON HCL INJECTION 0.25 MG/5ML
INTRAVENOUS | Status: AC
  Filled 2017-07-04: qty 5

## 2017-07-04 MED ORDER — SODIUM CHLORIDE 0.9% FLUSH
10.0000 mL | INTRAVENOUS | Status: DC | PRN
  Administered 2017-07-04: 10 mL
  Filled 2017-07-04: qty 10

## 2017-07-04 MED ORDER — SODIUM CHLORIDE 0.9 % IV SOLN
Freq: Once | INTRAVENOUS | Status: AC
  Administered 2017-07-04: 09:00:00 via INTRAVENOUS

## 2017-07-04 MED ORDER — SODIUM CHLORIDE 0.9 % IV SOLN
30.0000 mg/m2 | Freq: Once | INTRAVENOUS | Status: AC
  Administered 2017-07-04: 50 mg via INTRAVENOUS
  Filled 2017-07-04: qty 50

## 2017-07-04 MED ORDER — POTASSIUM CHLORIDE 2 MEQ/ML IV SOLN
Freq: Once | INTRAVENOUS | Status: AC
  Administered 2017-07-04: 09:00:00 via INTRAVENOUS
  Filled 2017-07-04: qty 10

## 2017-07-04 MED ORDER — FOSAPREPITANT DIMEGLUMINE INJECTION 150 MG
Freq: Once | INTRAVENOUS | Status: AC
  Administered 2017-07-04: 12:00:00 via INTRAVENOUS
  Filled 2017-07-04: qty 5

## 2017-07-04 MED ORDER — HEPARIN SOD (PORK) LOCK FLUSH 100 UNIT/ML IV SOLN
500.0000 [IU] | Freq: Once | INTRAVENOUS | Status: AC | PRN
  Administered 2017-07-04: 500 [IU]
  Filled 2017-07-04: qty 5

## 2017-07-04 NOTE — Patient Instructions (Signed)
[facility] Discharge Instructions for Patients Receiving Chemotherapy  Today you received the following chemotherapy agents: Doxorubicin (Adriamycin) and Cisplatin (Platinol).  To help prevent nausea and vomiting after your treatment, we encourage you to take your nausea medication as prescribed. Received Aloxi during treatment-->take Compazine (not Zofran) for the next 3 days.  If you develop nausea and vomiting that is not controlled by your nausea medication, call the clinic.   BELOW ARE SYMPTOMS THAT SHOULD BE REPORTED IMMEDIATELY:  *FEVER GREATER THAN 100.5 F  *CHILLS WITH OR WITHOUT FEVER  NAUSEA AND VOMITING THAT IS NOT CONTROLLED WITH YOUR NAUSEA MEDICATION  *UNUSUAL SHORTNESS OF BREATH  *UNUSUAL BRUISING OR BLEEDING  TENDERNESS IN MOUTH AND THROAT WITH OR WITHOUT PRESENCE OF ULCERS  *URINARY PROBLEMS  *BOWEL PROBLEMS  UNUSUAL RASH Items with * indicate a potential emergency and should be followed up as soon as possible.  Feel free to call the clinic should you have any questions or concerns. The clinic phone number is [phone].  Please show the [facility] [place] at check-in to the Emergency Department and triage nurse.

## 2017-07-11 ENCOUNTER — Other Ambulatory Visit: Payer: Self-pay | Admitting: *Deleted

## 2017-07-11 MED ORDER — DEXAMETHASONE 4 MG PO TABS: 4.0000 mg | ORAL_TABLET | Freq: Two times a day (BID) | ORAL | 1 refills | Status: DC

## 2017-07-11 MED ORDER — HYDROCODONE-ACETAMINOPHEN 10-325 MG PO TABS: 1.0000 | ORAL_TABLET | ORAL | 0 refills | Status: DC | PRN

## 2017-07-11 NOTE — Telephone Encounter (Signed)
Freddy left a message stating Mrs Benscoter needs a refill of decadron and hydrocodone.

## 2017-07-15 ENCOUNTER — Telehealth: Payer: Self-pay | Admitting: *Deleted

## 2017-07-15 NOTE — Telephone Encounter (Signed)
Freddie called to say he noticed blood on Emily Duarte's sheets and blood on the floor going to bathroom. Is planning to take her to the ED tonight, but will call us in the am to let us know. If they don't go to the ED, he will bring her into [facility] in the AM for labs.

## 2017-07-16 NOTE — Telephone Encounter (Signed)
Received VM from pt's son Annalee Genta, he said the patient's bleeding comes and goes now and she feels ok to wait until her appt on this Friday, reports she has energy.  Will route this note to Dr Alvy Bimler.

## 2017-07-18 ENCOUNTER — Inpatient Hospital Stay (HOSPITAL_BASED_OUTPATIENT_CLINIC_OR_DEPARTMENT_OTHER): Payer: Medicare Other | Admitting: Hematology and Oncology

## 2017-07-18 ENCOUNTER — Inpatient Hospital Stay: Payer: Medicare Other

## 2017-07-18 ENCOUNTER — Telehealth: Payer: Self-pay

## 2017-07-18 ENCOUNTER — Encounter: Payer: Self-pay | Admitting: Hematology and Oncology

## 2017-07-18 VITALS — BP 148/96 | HR 104 | Temp 97.9°F | Resp 18 | Ht 64.0 in | Wt 145.8 lb

## 2017-07-18 DIAGNOSIS — N950 Postmenopausal bleeding: Secondary | ICD-10-CM

## 2017-07-18 DIAGNOSIS — G893 Neoplasm related pain (acute) (chronic): Secondary | ICD-10-CM | POA: Diagnosis not present

## 2017-07-18 DIAGNOSIS — D638 Anemia in other chronic diseases classified elsewhere: Secondary | ICD-10-CM

## 2017-07-18 DIAGNOSIS — D590 Drug-induced autoimmune hemolytic anemia: Secondary | ICD-10-CM

## 2017-07-18 DIAGNOSIS — E440 Moderate protein-calorie malnutrition: Secondary | ICD-10-CM

## 2017-07-18 DIAGNOSIS — N183 Chronic kidney disease, stage 3 (moderate): Secondary | ICD-10-CM | POA: Diagnosis not present

## 2017-07-18 DIAGNOSIS — R531 Weakness: Secondary | ICD-10-CM

## 2017-07-18 DIAGNOSIS — I82412 Acute embolism and thrombosis of left femoral vein: Secondary | ICD-10-CM | POA: Diagnosis not present

## 2017-07-18 DIAGNOSIS — C541 Malignant neoplasm of endometrium: Secondary | ICD-10-CM

## 2017-07-18 DIAGNOSIS — N179 Acute kidney failure, unspecified: Secondary | ICD-10-CM

## 2017-07-18 DIAGNOSIS — K8020 Calculus of gallbladder without cholecystitis without obstruction: Secondary | ICD-10-CM | POA: Diagnosis not present

## 2017-07-18 DIAGNOSIS — Z7901 Long term (current) use of anticoagulants: Secondary | ICD-10-CM

## 2017-07-18 DIAGNOSIS — Z5111 Encounter for antineoplastic chemotherapy: Secondary | ICD-10-CM | POA: Diagnosis not present

## 2017-07-18 DIAGNOSIS — E876 Hypokalemia: Secondary | ICD-10-CM

## 2017-07-18 DIAGNOSIS — Z79899 Other long term (current) drug therapy: Secondary | ICD-10-CM

## 2017-07-18 LAB — CBC WITH DIFFERENTIAL/PLATELET
Basophils Absolute: 0 10*3/uL (ref 0.0–0.1)
Basophils Relative: 1 %
Eosinophils Absolute: 0 10*3/uL (ref 0.0–0.5)
Eosinophils Relative: 0 %
HEMATOCRIT: 25.1 % — AB (ref 34.8–46.6)
Hemoglobin: 8.1 g/dL — ABNORMAL LOW (ref 11.6–15.9)
LYMPHS ABS: 0.9 10*3/uL (ref 0.9–3.3)
LYMPHS PCT: 34 %
MCH: 30.1 pg (ref 25.1–34.0)
MCHC: 32.3 g/dL (ref 31.5–36.0)
MCV: 93.3 fL (ref 79.5–101.0)
MONO ABS: 0.5 10*3/uL (ref 0.1–0.9)
Monocytes Relative: 16 %
NEUTROS ABS: 1.4 10*3/uL — AB (ref 1.5–6.5)
Neutrophils Relative %: 49 %
Platelets: 346 10*3/uL (ref 145–400)
RBC: 2.69 MIL/uL — AB (ref 3.70–5.45)
RDW: 15 % — AB (ref 11.2–14.5)
WBC: 2.8 10*3/uL — AB (ref 3.9–10.3)

## 2017-07-18 LAB — COMPREHENSIVE METABOLIC PANEL
ALT: 19 U/L (ref 0–55)
AST: 18 U/L (ref 5–34)
Albumin: 2.2 g/dL — ABNORMAL LOW (ref 3.5–5.0)
Alkaline Phosphatase: 121 U/L (ref 40–150)
Anion gap: 11 (ref 3–11)
BILIRUBIN TOTAL: 0.3 mg/dL (ref 0.2–1.2)
BUN: 35 mg/dL — AB (ref 7–26)
CO2: 27 mmol/L (ref 22–29)
CREATININE: 1.44 mg/dL — AB (ref 0.60–1.10)
Calcium: 9.1 mg/dL (ref 8.4–10.4)
Chloride: 99 mmol/L (ref 98–109)
GFR calc Af Amer: 42 mL/min — ABNORMAL LOW (ref >60)
GFR, EST NON AFRICAN AMERICAN: 36 mL/min — AB (ref >60)
Glucose, Bld: 104 mg/dL (ref 70–140)
Potassium: 3.7 mmol/L (ref 3.5–5.1)
Sodium: 137 mmol/L (ref 136–145)
Total Protein: 6.7 g/dL (ref 6.4–8.3)

## 2017-07-18 LAB — PREPARE RBC (CROSSMATCH)

## 2017-07-18 LAB — SAMPLE TO BLOOD BANK

## 2017-07-18 LAB — MAGNESIUM: MAGNESIUM: 1.7 mg/dL (ref 1.5–2.5)

## 2017-07-18 MED ORDER — DIPHENHYDRAMINE HCL 25 MG PO CAPS
ORAL_CAPSULE | ORAL | Status: AC
  Filled 2017-07-18: qty 1

## 2017-07-18 MED ORDER — HEPARIN SOD (PORK) LOCK FLUSH 100 UNIT/ML IV SOLN
500.0000 [IU] | Freq: Every day | INTRAVENOUS | Status: AC | PRN
  Administered 2017-07-18: 500 [IU]
  Filled 2017-07-18: qty 5

## 2017-07-18 MED ORDER — SODIUM CHLORIDE 0.9% FLUSH
10.0000 mL | INTRAVENOUS | Status: AC | PRN
  Administered 2017-07-18: 10 mL
  Filled 2017-07-18: qty 10

## 2017-07-18 MED ORDER — ACETAMINOPHEN 325 MG PO TABS
ORAL_TABLET | ORAL | Status: AC
  Filled 2017-07-18: qty 2

## 2017-07-18 MED ORDER — DIPHENHYDRAMINE HCL 25 MG PO CAPS
25.0000 mg | ORAL_CAPSULE | Freq: Once | ORAL | Status: AC
  Administered 2017-07-18: 25 mg via ORAL

## 2017-07-18 MED ORDER — ACETAMINOPHEN 325 MG PO TABS
650.0000 mg | ORAL_TABLET | Freq: Once | ORAL | Status: AC
  Administered 2017-07-18: 650 mg via ORAL

## 2017-07-18 MED ORDER — SODIUM CHLORIDE 0.9% FLUSH
10.0000 mL | INTRAVENOUS | Status: DC | PRN
  Filled 2017-07-18: qty 10

## 2017-07-18 MED ORDER — SODIUM CHLORIDE 0.9% FLUSH
10.0000 mL | Freq: Once | INTRAVENOUS | Status: AC
  Administered 2017-07-18: 10 mL
  Filled 2017-07-18: qty 10

## 2017-07-18 NOTE — Telephone Encounter (Signed)
-----   Message from Heath Lark, MD sent at 07/18/2017 10:27 AM EDT ----- Regarding: labs pls let her son Annalee Genta know kidney function and magnesium very good. Continue xarelto and magnesium, no change Need more protein in diet. Albumin very low ----- Message ----- From: Interface, Lab In [place] Sent: 07/18/2017   9:58 AM To: Heath Lark, MD

## 2017-07-18 NOTE — Assessment & Plan Note (Signed)
She is weak from symptomatic anemia due to mild vaginal bleeding We discussed some of the risks, benefits, and alternatives of blood transfusions. The patient is symptomatic from anemia and the hemoglobin level is critically low.  Some of the side-effects to be expected including risks of transfusion reactions, chills, infection, syndrome of volume overload and risk of hospitalization from various reasons and the patient is willing to proceed and went ahead to sign consent today. I would proceed to give her 2 units of blood today

## 2017-07-18 NOTE — Assessment & Plan Note (Signed)
I discussed extensively with her son She is on anticoagulation therapy and the risk of bleeding has to be balanced with the benefits of anticoagulation treatment For now, we will continue Xarelto

## 2017-07-18 NOTE — Patient Instructions (Addendum)
Blood Transfusion, Care After This sheet gives you information about how to care for yourself after your procedure. Your doctor may also give you more specific instructions. If you have problems or questions, contact your doctor. Follow these instructions at home:  Take over-the-counter and prescription medicines only as told by your doctor.  Go back to your normal activities as told by your doctor.  Follow instructions from your doctor about how to take care of the area where an IV tube was put into your vein (insertion site). Make sure you: ? Wash your hands with soap and water before you change your bandage (dressing). If there is no soap and water, use hand sanitizer. ? Change your bandage as told by your doctor.  Check your IV insertion site every day for signs of infection. Check for: ? More redness, swelling, or pain. ? More fluid or blood. ? Warmth. ? Pus or a bad smell. Contact a doctor if:  You have more redness, swelling, or pain around the IV insertion site..  You have more fluid or blood coming from the IV insertion site.  Your IV insertion site feels warm to the touch.  You have pus or a bad smell coming from the IV insertion site.  Your pee (urine) turns pink, red, or brown.  You feel weak after doing your normal activities. Get help right away if:  You have signs of a serious allergic or body defense (immune) system reaction, including: ? Itchiness. ? Hives. ? Trouble breathing. ? Anxiety. ? Pain in your chest or lower back. ? Fever, flushing, and chills. ? Fast pulse. ? Rash. ? Watery poop (diarrhea). ? Throwing up (vomiting). ? Dark pee. ? Serious headache. ? Dizziness. ? Stiff neck. ? Yellow color in your face or the white parts of your eyes (jaundice). Summary  After a blood transfusion, return to your normal activities as told by your doctor.  Every day, check for signs of infection where the IV tube was put into your vein.  Some signs of  infection are warm skin, more redness and pain, more fluid or blood, and pus or a bad smell where the needle went in.  Contact your doctor if you feel weak or have any unusual symptoms. This information is not intended to replace advice given to you by your health care provider. Make sure you discuss any questions you have with your health care provider. Document Released: 04/29/2014 Document Revised: 12/01/2015 Document Reviewed: 12/01/2015 Elsevier Interactive Patient Education  2017 [place] in Foods Generally, most healthy people need around 50 grams of protein each day. Depending on your overall health, you may need more or less protein in your diet. Talk to your health care provider or dietitian about how much protein you need. See the following list for the protein content of some common foods. High-protein foods High-protein foods contain 4 grams (4 g) or more of protein per serving. They include: Beef, ground sirloin (cooked) - 3 oz have 24 g of protein. Cheese (hard) - 1 oz has 7 g of protein. Chicken breast, boneless and skinless (cooked) - 3 oz have 13.4 g of protein. Cottage cheese - 1/2 cup has 13.4 g of protein. Egg - 1 egg has 6 g of protein. Fish, filet (cooked) - 1 oz has 6-7 g of protein. Garbanzo beans (canned or cooked) - 1/2 cup has 6-7 g of protein. Kidney beans (canned or cooked) - 1/2 cup has 6-7 g of protein. Lamb (cooked) -  3 oz has 24 g of protein. Milk - 1 cup (8 oz) has 8 g of protein. Nuts (peanuts, pistachios, almonds) - 1 oz has 6 g of protein. Peanut butter - 1 oz has 7-8 g of protein. Pork tenderloin (cooked) - 3 oz has 18.4 g of protein. Pumpkin seeds - 1 oz has 8.5 g of protein. Soybeans (roasted) - 1 oz has 8 g of protein. Soybeans (cooked) - 1/2 cup has 11 g of protein. Soy milk - 1 cup (8 oz) has 5-10 g of protein. Soy or vegetable patty - 1 patty has 11 g of protein. Sunflower seeds - 1 oz has 5.5 g of protein. Tofu (firm)  - 1/2 cup has 20 g of protein. Tuna (canned in water) - 3 oz has 20 g of protein. Yogurt - 6 oz has 8 g of protein.  Low-protein foods Low-protein foods contain 3 grams (3 g) or less of protein per serving. They include: Beets (raw or cooked) - 1/2 cup has 1.5 g of protein. Bran cereal - 1/2 cup has 2-3 g of protein. Bread - 1 slice has 2.5 g of protein. Broccoli (raw or cooked) - 1/2 cup has 2 g of protein. Collard greens (raw or cooked) - 1/2 cup has 2 g of protein. Corn (fresh or cooked) - 1/2 cup has 2 g of protein. Cream cheese - 1 oz has 2 g of protein. Creamer (half-and-half) - 1 oz has 1 g of protein. Flour tortilla - 1 tortilla has 2.5 g of protein Frozen yogurt - 1/2 cup has 3 g of protein. Fruit or vegetable juice - 1/2 cup has 1 g of protein. Green beans (raw or cooked) - 1/2 cup has 1 g of protein. Green peas (canned) - 1/2 cup has 3.5 g of protein. Muffins - 1 small muffin (2 oz) has 3 g of protein. Oatmeal (cooked) - 1/2 cup has 3 g of protein. Potato (baked with skin) - 1 medium potato has 3 g of protein. Rice (cooked) - 1/2 cup has 2.5-3.5 g of protein. Sour cream - 1/2 cup has 2.5 g of protein. Spinach (cooked) - 1/2 cup has 3 g of protein. Squash (cooked) - 1/2 cup has 1.5 g of protein.  Actual amounts of protein may be different depending on processing. Talk with your health care provider or dietitian about what foods are recommended for you. This information is not intended to replace advice given to you by your health care provider. Make sure you discuss any questions you have with your health care provider. Document Released: 07/08/2015 Document Revised: 12/18/2015 Document Reviewed: 12/18/2015 Elsevier Interactive Patient Education  [employer].

## 2017-07-18 NOTE — Assessment & Plan Note (Signed)
The patient is weak Thankfully, recent CT imaging showed positive response to treatment Due to her frail status, her treatment cycle is changed to every 4 weeks She has lost a lot of weight.  I will adjust the dose of chemotherapy accordingly and plan to further reduce the dose of cisplatin due to recent renal failure and low magnesium I will continue to see her every 2 weeks for supportive care

## 2017-07-18 NOTE — Assessment & Plan Note (Signed)
She has stable chronic kidney disease stage III We discussed importance of increase oral fluid hydration We will continue to monitor her blood counts carefully and renal function carefully

## 2017-07-18 NOTE — Telephone Encounter (Signed)
Called and left below message. Instructed to call nurse for questions.

## 2017-07-18 NOTE — Assessment & Plan Note (Signed)
She will continue on dexamethasone to control nausea I encouraged her to increase oral intake as tolerated She will be seeing a dietitian in 2 weeks

## 2017-07-18 NOTE — Progress Notes (Signed)
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  I will adjust the dose of chemotherapy accordingly and plan to further reduce the dose of cisplatin due to recent renal failure and low magnesium I will continue to see her every 2 weeks for supportive care  Anemia, chronic disease She is weak from symptomatic anemia due to mild vaginal bleeding We discussed some of the risks, benefits, and alternatives of blood transfusions. The patient is symptomatic from anemia and the hemoglobin level is critically low.  Some of the side-effects to be expected including risks of transfusion reactions, chills, infection, syndrome of volume overload and risk of hospitalization from various reasons and the patient is willing to proceed and went ahead to sign consent today. I would proceed to give her 2 units of blood today  Acute prerenal failure ([place]) She has stable chronic kidney disease stage III We discussed importance of increase oral fluid hydration We will continue to monitor her blood counts carefully and renal function carefully  Left femoral vein DVT ([place]) I discussed extensively with her son She is on anticoagulation therapy and the risk of bleeding has to be balanced with the benefits of anticoagulation treatment For now, we will continue Xarelto  Protein-calorie malnutrition, moderate ([place]) She will continue on dexamethasone to control nausea I encouraged her to increase oral intake as tolerated She will be seeing a dietitian in 2 weeks    No orders of the defined types were placed in this encounter.   INTERVAL HISTORY: Please see below for problem oriented  charting. She returns for further follow-up She is weak She has lost some weight but her son felt that the patient is eating adequate Majority of the fluid retention has resolved She continues to have mild intermittent vaginal bleeding She denies epistaxis, hematuria or hematochezia No chest pain or shortness of breath She denies nausea or constipation  SUMMARY OF ONCOLOGIC HISTORY: Oncology History   Focally ER positive MSI stable disease     Endometrial/uterine adenocarcinoma ([place])   08/01/2016 Initial Diagnosis    She was admitted to  [facility] on 08/01/16 with postmenopausal vaginal bleeding lasting 10 days. However, she was transferred to [facility] on 08/06/16 for suicidal ideation. There, GYN was consulted and ordered biopsy. Pt was transferred to medical unit for blood transfusion.       08/10/2016 Imaging    US pelvis:  The uterus is prominent and heterogeneous measuring 10.9 x 6.3 x 6.1 cm. There is a probable anterior exophytic fundal fibroid measuring 4.5 cm. The endometrial stripe is normal in thickness at 6.4 mm. I see no fluid in the canal  Right ovary is not visible. Left ovary 2.3 x 1.7 x 1.9 cm with normal echogenicity. Color flow and spectral Doppler: Normal arterial inflow and venous outflow.  There is no significant free fluid.   I see no adnexal masses.      08/11/2016 - 08/15/2016 Hospital Admission    She was admitted voluntarily to Psychiatry unit 08/06/2016 for suicidal ideation.She was evaluated in an outside facility due to vaginal bleeding and endometrial and endocervical biopsies performed. Pathology revealed a poorly differentiated cancer with positive estrogen receptors  felt to be suggestive of endometrial cancer. The patient received 2 units of prbcs total during her stay for anemia.        08/11/2016 Pathology Results       1. Endocervix, curettings and polyp. (*please see comment below). --High-grade carcinoma,  undifferentiated.  2. Endometrial biopsy:(*please see comment below). --High-grade carcinoma, undifferentiated. --Weakly positive for estrogen receptors suggestive of endometrial origin; Negative for progesterone receptors and neutral mucin..       08/14/2016 Imaging    CT imaging 1. Uterine mass and abnormality consistent with clinical history of endometrial carcinoma as described above with bilateral pelvic and retroperitoneal adenopathy consistent with nodal metastasis. 2. No evidence of nonnodal distant metastasis. 3. Nonspecific incidental finding of hypertrophy of the pylorus muscle. 4. incidental finding of cholelithiasis      09/04/2016 Imaging    CT chest: 1. 4 mm nodule in the anterior aspect of the right upper lobe. This is highly nonspecific. Although metastatic disease is not excluded, it is not strongly favored. Attention under routine followup examinations is recommended to ensure the stability or resolution of this finding. 2. Trace right pleural effusion lying dependently is simple in appearance. 3. Mild cardiomegaly.      09/05/2016 Procedure    Successful placement of a right internal jugular approach power injectable Port-A-Cath. The catheter is ready for immediate use.      09/05/2016 Tumor Marker    Patient's tumor was tested for the following markers: CA125 Results of the tumor marker test revealed 1150      09/09/2016 - 12/30/2016 Chemotherapy    She received chemotherapy with carboplatin and Taxol x 6 cycles      09/30/2016 Tumor Marker    Patient's tumor was tested for the following markers: CA125 Results of the tumor marker test revealed 338.1      10/28/2016 Tumor Marker    Patient's tumor was tested for the following markers: CA125 Results of the tumor marker test revealed 210.9      11/08/2016 Imaging    CT scan abdomen and pelvis 1. Soft tissue nodularity throughout the omental and perihepatic fat worrisome for peritoneal carcinomatosis. No  ascites. 2. No other definitive evidence of metastatic disease. There are prominent left pelvic sidewall and right inguinal lymph nodes. 3. Nonspecific findings in the uterus, likely related to treated tumor and fibroids. Prior pelvic imaging unavailable. 4. Cholelithiasis.      12/09/2016 Tumor Marker    Patient's tumor was tested for the following markers: CA125 Results of the tumor marker test revealed 98.1      12/30/2016 Tumor Marker    Patient's tumor was tested for the following markers: CA125 Results of the tumor marker test revealed 106.1      01/07/2017 Imaging    1. Stable soft tissue nodularity along the omentum suspicious for peritoneal spread of tumor. Stable appearance of nodularity along the uterine fundus and indistinctness along the right adnexa, some of this may be due to fibroids. 2. The previously borderline enlarged left external iliac lymph node is now normal in size. 3. Stable 4 mm ill-defined right upper lobe pulmonary nodule anteriorly, this may well in depth being benign but merits surveillance. 4. Cholelithiasis.      03/03/2017 Tumor Marker    Patient's tumor was tested for the following markers: CA125 Results of the tumor marker test revealed 694.5      03/07/2017 Imaging    1. Clear interval progression of peritoneal and omental disease as above. Diffuse mesenteric  thickening and nodularity has progressed. 2. New juxta diaphragmatic lymph nodes compatible with metastatic disease. 3. Cholelithiasis. 4. Uterine enlargement with probable fibroid change. Ill-defined hypovascular lesion in the lower uterine segment/cervix may represent primary malignancy although this is difficult to assess by CT.      03/18/2017 Imaging    ECHO: Normal LV size with EF 55-60%. Normal RV size and systolic function. Mild MR.      06/06/2017 Tumor Marker    Patient's tumor was tested for the following markers: CA125 Results of the tumor marker test revealed 710.5       06/20/2017 Imaging    1. Slightly improved omental disease. Scattered peritoneal surface disease and mesenteric disease appears relatively stable. 2. New cystic lesion peripherally in the right hepatic lobe could be treated peritoneal surface disease. 3. Persistent extensive endometrial neoplasm involving the lower uterine segment and cervix. 4. Cholelithiasis. 5. Moderate-sized right effusion with overlying atelectasis but no enhancing pleural nodules or pulmonary nodules. 6. Deep venous thrombosis in the left common femoral vein.      06/20/2017 Imaging    LV EF: 60% -  65%      07/01/2017 Tumor Marker    Patient's tumor was tested for the following markers: CA125 Results of the tumor marker test revealed 493.4       REVIEW OF SYSTEMS:   Eyes: Denies blurriness of vision Ears, nose, mouth, throat, and face: Denies mucositis or sore throat Respiratory: Denies cough, dyspnea or wheezes Cardiovascular: Denies palpitation, chest discomfort  Gastrointestinal:  Denies nausea, heartburn or change in bowel habits Skin: Denies abnormal skin rashes Lymphatics: Denies new lymphadenopathy or easy bruising Behavioral/Psych: Mood is stable, no new changes  All other systems were reviewed with the patient and are negative.  I have reviewed the past medical history, past surgical history, social history and family history with the patient and they are unchanged from previous note.  ALLERGIES:  has No Known Allergies.  MEDICATIONS:  Current Outpatient Medications  Medication Sig Dispense Refill  . dexamethasone (DECADRON) 4 MG tablet Take 1 tablet (4 mg total) by mouth 2 (two) times daily with a meal. 60 tablet 1  . divalproex (DEPAKOTE ER) 500 MG 24 hr tablet Take 500 mg by mouth at bedtime.    Marland Kitchen HYDROcodone-acetaminophen (NORCO) 10-325 MG tablet Take 1 tablet by mouth every 4 (four) hours as needed. 60 tablet 0  . magnesium oxide (MAG-OX) 400 (241.3 Mg) MG tablet Take 1 tablet (400 mg total)  by mouth 2 (two) times daily. 60 tablet 11  . ondansetron (ZOFRAN) 8 MG tablet Take 1 tablet (8 mg total) by mouth every 8 (eight) hours as needed for nausea. 30 tablet 3  . potassium chloride SA (K-DUR,KLOR-CON) 20 MEQ tablet Take 1 tablet (20 mEq total) by mouth 2 (two) times daily. 28 tablet 0  . prochlorperazine (COMPAZINE) 10 MG tablet Take 1 tablet (10 mg total) by mouth every 6 (six) hours as needed for nausea or vomiting. 30 tablet 1  . Rivaroxaban 15 & 20 MG TBPK Take as directed on package: Start with one 52m tablet by mouth twice a day with food. On Day 22, switch to one 245mtablet once a day with food. 51 each 0   No current facility-administered medications for this visit.    Facility-Administered Medications Ordered in Other Visits  Medication Dose Route Frequency Provider Last Rate Last Dose  . magnesium sulfate IVPB 4 g 100 mL  4 g Intravenous Once Raziah Funnell,  MD        PHYSICAL EXAMINATION: ECOG PERFORMANCE STATUS: 2 - Symptomatic, <50% confined to bed  Vitals:   07/18/17 0957  BP: (!) 148/96  Pulse: (!) 104  Resp: 18  Temp: 97.9 F (36.6 C)  SpO2: 98%   Filed Weights   07/18/17 0957  Weight: 145 lb 12.8 oz (66.1 kg)    GENERAL:alert, no distress and comfortable.  She looks thin SKIN: skin color is pale, texture, turgor are normal, no rashes or significant lesions EYES: normal, Conjunctiva are pale and non-injected, sclera clear OROPHARYNX:no exudate, no erythema and lips, buccal mucosa, and tongue normal  NECK: supple, thyroid normal size, non-tender, without nodularity LYMPH:  no palpable lymphadenopathy in the cervical, axillary or inguinal LUNGS: clear to auscultation and percussion with normal breathing effort HEART: regular rate & rhythm and no murmurs and no lower extremity edema ABDOMEN:abdomen soft, non-tender and normal bowel sounds Musculoskeletal:no cyanosis of digits and no clubbing  NEURO: alert & oriented x 3 with fluent speech, no focal  motor/sensory deficits  LABORATORY DATA:  I have reviewed the data as listed    Component Value Date/Time   NA 137 07/18/2017 0948   NA 135 (L) 04/23/2017 1359   K 3.7 07/18/2017 0948   K 3.8 04/23/2017 1359   CL 99 07/18/2017 0948   CO2 27 07/18/2017 0948   CO2 27 04/23/2017 1359   GLUCOSE 104 07/18/2017 0948   GLUCOSE 98 04/23/2017 1359   BUN 35 (H) 07/18/2017 0948   BUN 10.8 04/23/2017 1359   CREATININE 1.44 (H) 07/18/2017 0948   CREATININE 0.8 04/23/2017 1359   CALCIUM 9.1 07/18/2017 0948   CALCIUM 8.6 04/23/2017 1359   PROT 6.7 07/18/2017 0948   PROT 7.5 04/23/2017 1359   ALBUMIN 2.2 (L) 07/18/2017 0948   ALBUMIN 2.6 (L) 04/23/2017 1359   AST 18 07/18/2017 0948   AST 38 (H) 04/23/2017 1359   ALT 19 07/18/2017 0948   ALT 22 04/23/2017 1359   ALKPHOS 121 07/18/2017 0948   ALKPHOS 117 04/23/2017 1359   BILITOT 0.3 07/18/2017 0948   BILITOT 0.62 04/23/2017 1359   GFRNONAA 36 (L) 07/18/2017 0948   GFRAA 42 (L) 07/18/2017 0948    No results found for: SPEP, UPEP  Lab Results  Component Value Date   WBC 2.8 (L) 07/18/2017   NEUTROABS 1.4 (L) 07/18/2017   HGB 8.1 (L) 07/18/2017   HCT 25.1 (L) 07/18/2017   MCV 93.3 07/18/2017   PLT 346 07/18/2017      Chemistry      Component Value Date/Time   NA 137 07/18/2017 0948   NA 135 (L) 04/23/2017 1359   K 3.7 07/18/2017 0948   K 3.8 04/23/2017 1359   CL 99 07/18/2017 0948   CO2 27 07/18/2017 0948   CO2 27 04/23/2017 1359   BUN 35 (H) 07/18/2017 0948   BUN 10.8 04/23/2017 1359   CREATININE 1.44 (H) 07/18/2017 0948   CREATININE 0.8 04/23/2017 1359      Component Value Date/Time   CALCIUM 9.1 07/18/2017 0948   CALCIUM 8.6 04/23/2017 1359   ALKPHOS 121 07/18/2017 0948   ALKPHOS 117 04/23/2017 1359   AST 18 07/18/2017 0948   AST 38 (H) 04/23/2017 1359   ALT 19 07/18/2017 0948   ALT 22 04/23/2017 1359   BILITOT 0.3 07/18/2017 0948   BILITOT 0.62 04/23/2017 1359       RADIOGRAPHIC STUDIES: I have  personally reviewed the radiological images as listed and  agreed with the findings in the report. Ct Chest W Contrast  Result Date: 06/20/2017 CLINICAL DATA:  Endometrial carcinoma.  Restaging. EXAM: CT CHEST, ABDOMEN, AND PELVIS WITH CONTRAST TECHNIQUE: Multidetector CT imaging of the chest, abdomen and pelvis was performed following the standard protocol during bolus administration of intravenous contrast. CONTRAST:  55m ISOVUE-300 IOPAMIDOL (ISOVUE-300) INJECTION 61% COMPARISON:  03/07/2017 FINDINGS: CT CHEST FINDINGS Cardiovascular: The heart is normal in size. No pericardial effusion. Mild tortuosity of the thoracic aorta but no aneurysm or dissection. No atherosclerotic calcifications. No definite coronary artery calcifications. Mediastinum/Nodes: No mediastinal or hilar mass or adenopathy. Esophagus is grossly normal. Lungs/Pleura: Biapical pleural and parenchymal scarring changes. Moderate-sized right pleural effusion with overlying atelectasis. No pulmonary nodules are identified. Minimal left basilar atelectasis. Musculoskeletal: No breast masses, supraclavicular or axillary adenopathy. The thyroid gland appears normal. The bony thorax is intact. CT ABDOMEN PELVIS FINDINGS Hepatobiliary: There is a new 4.4 cm right hepatic lobe cyst. This could be the sequela of a treated peritoneal surface lesion. No intraparenchymal lesions are identified. Gallbladder is mildly distended and there are numerous gallstones. Hazy interstitial change around the gallbladder, around the second portion of duodenum and around the inferior aspect of the liver could all be due to peritoneal surface disease. Pancreas: No mass, inflammation or ductal dilatation. Spleen: Normal size.  No focal lesions. Adrenals/Urinary Tract: The adrenal glands and kidneys are unremarkable and stable. The bladder appears normal. Stomach/Bowel: The stomach, duodenum, small bowel and colon are grossly normal. No obstructive findings or acute  inflammatory process. Vascular/Lymphatic: The aorta and branch vessels are patent. The major venous structures are patent. Small scattered mesenteric and retroperitoneal lymph nodes appears stable. Small subdiaphragmatic nodes are stable. Interval improved CT appearance of the omental disease. The omental mass on the prior study measured 14.5 x 12.5 mm and now measures 9.9 x 8 mm on image number 67. The disease near the liver is also slightly smaller. This previously measured 4.2 x 1.5 cm and now measures 4.1 x 1.1 cm. Other patchy ill-defined omental and and mesenteric lesions appear stable. Reproductive: Extensive necrotic appearing endometrial tumor involving the lower uterine segment and cervix measures 7.0 x 3.3 cm on sagittal sequence image 191 and previously measured 6.0 x 4.5 cm. Uterine fibroids are also noted. There are cysts associated with both ovaries. Other: No free pelvic fluid collections are identified. No change in small pelvic sidewall lymph nodes. Thrombus noted in the left common femoral vein. Musculoskeletal: No significant bony findings. IMPRESSION: 1. Slightly improved omental disease. Scattered peritoneal surface disease and mesenteric disease appears relatively stable. 2. New cystic lesion peripherally in the right hepatic lobe could be treated peritoneal surface disease. 3. Persistent extensive endometrial neoplasm involving the lower uterine segment and cervix. 4. Cholelithiasis. 5. Moderate-sized right effusion with overlying atelectasis but no enhancing pleural nodules or pulmonary nodules. 6. Deep venous thrombosis in the left common femoral vein. These results will be called to the ordering clinician or representative by the Radiologist Assistant, and communication documented in the PACS or zVision Dashboard. Electronically Signed   By: PMarijo SanesM.D.   On: 06/20/2017 13:46   Ct Abdomen Pelvis W Contrast  Result Date: 06/20/2017 CLINICAL DATA:  Endometrial carcinoma.   Restaging. EXAM: CT CHEST, ABDOMEN, AND PELVIS WITH CONTRAST TECHNIQUE: Multidetector CT imaging of the chest, abdomen and pelvis was performed following the standard protocol during bolus administration of intravenous contrast. CONTRAST:  759mISOVUE-300 IOPAMIDOL (ISOVUE-300) INJECTION 61% COMPARISON:  03/07/2017 FINDINGS: CT CHEST  FINDINGS Cardiovascular: The heart is normal in size. No pericardial effusion. Mild tortuosity of the thoracic aorta but no aneurysm or dissection. No atherosclerotic calcifications. No definite coronary artery calcifications. Mediastinum/Nodes: No mediastinal or hilar mass or adenopathy. Esophagus is grossly normal. Lungs/Pleura: Biapical pleural and parenchymal scarring changes. Moderate-sized right pleural effusion with overlying atelectasis. No pulmonary nodules are identified. Minimal left basilar atelectasis. Musculoskeletal: No breast masses, supraclavicular or axillary adenopathy. The thyroid gland appears normal. The bony thorax is intact. CT ABDOMEN PELVIS FINDINGS Hepatobiliary: There is a new 4.4 cm right hepatic lobe cyst. This could be the sequela of a treated peritoneal surface lesion. No intraparenchymal lesions are identified. Gallbladder is mildly distended and there are numerous gallstones. Hazy interstitial change around the gallbladder, around the second portion of duodenum and around the inferior aspect of the liver could all be due to peritoneal surface disease. Pancreas: No mass, inflammation or ductal dilatation. Spleen: Normal size.  No focal lesions. Adrenals/Urinary Tract: The adrenal glands and kidneys are unremarkable and stable. The bladder appears normal. Stomach/Bowel: The stomach, duodenum, small bowel and colon are grossly normal. No obstructive findings or acute inflammatory process. Vascular/Lymphatic: The aorta and branch vessels are patent. The major venous structures are patent. Small scattered mesenteric and retroperitoneal lymph nodes appears  stable. Small subdiaphragmatic nodes are stable. Interval improved CT appearance of the omental disease. The omental mass on the prior study measured 14.5 x 12.5 mm and now measures 9.9 x 8 mm on image number 67. The disease near the liver is also slightly smaller. This previously measured 4.2 x 1.5 cm and now measures 4.1 x 1.1 cm. Other patchy ill-defined omental and and mesenteric lesions appear stable. Reproductive: Extensive necrotic appearing endometrial tumor involving the lower uterine segment and cervix measures 7.0 x 3.3 cm on sagittal sequence image 191 and previously measured 6.0 x 4.5 cm. Uterine fibroids are also noted. There are cysts associated with both ovaries. Other: No free pelvic fluid collections are identified. No change in small pelvic sidewall lymph nodes. Thrombus noted in the left common femoral vein. Musculoskeletal: No significant bony findings. IMPRESSION: 1. Slightly improved omental disease. Scattered peritoneal surface disease and mesenteric disease appears relatively stable. 2. New cystic lesion peripherally in the right hepatic lobe could be treated peritoneal surface disease. 3. Persistent extensive endometrial neoplasm involving the lower uterine segment and cervix. 4. Cholelithiasis. 5. Moderate-sized right effusion with overlying atelectasis but no enhancing pleural nodules or pulmonary nodules. 6. Deep venous thrombosis in the left common femoral vein. These results will be called to the ordering clinician or representative by the Radiologist Assistant, and communication documented in the PACS or zVision Dashboard. Electronically Signed   By: Marijo Sanes M.D.   On: 06/20/2017 13:46    All questions were answered. The patient knows to call the clinic with any problems, questions or concerns. No barriers to learning was detected.  I spent 25 minutes counseling the patient face to face. The total time spent in the appointment was 30 minutes and more than 50% was on  counseling and review of test results  Heath Lark, MD 07/18/2017 11:53 AM

## 2017-07-19 LAB — TYPE AND SCREEN
ABO/RH(D): O POS
Antibody Screen: NEGATIVE
UNIT DIVISION: 0
UNIT DIVISION: 0

## 2017-07-19 LAB — BPAM RBC
Blood Product Expiration Date: 201904232359
Blood Product Expiration Date: 201904232359
ISSUE DATE / TIME: 201903291128
ISSUE DATE / TIME: 201903291128
UNIT TYPE AND RH: 5100
Unit Type and Rh: 5100

## 2017-07-29 ENCOUNTER — Other Ambulatory Visit: Payer: Self-pay | Admitting: *Deleted

## 2017-07-29 MED ORDER — HYDROCODONE-ACETAMINOPHEN 10-325 MG PO TABS: 1.0000 | ORAL_TABLET | ORAL | 0 refills | Status: DC | PRN

## 2017-07-29 NOTE — Telephone Encounter (Signed)
Emily Duarte left a message stating Emily Duarte needs a refill of hydrocodone

## 2017-08-01 ENCOUNTER — Inpatient Hospital Stay: Payer: Medicare Other | Attending: Hematology and Oncology

## 2017-08-01 ENCOUNTER — Inpatient Hospital Stay: Payer: Medicare Other

## 2017-08-01 ENCOUNTER — Inpatient Hospital Stay: Payer: Medicare Other | Admitting: Nutrition

## 2017-08-01 ENCOUNTER — Inpatient Hospital Stay (HOSPITAL_BASED_OUTPATIENT_CLINIC_OR_DEPARTMENT_OTHER): Payer: Medicare Other | Admitting: Hematology and Oncology

## 2017-08-01 ENCOUNTER — Other Ambulatory Visit: Payer: Self-pay | Admitting: Hematology and Oncology

## 2017-08-01 ENCOUNTER — Telehealth: Payer: Self-pay | Admitting: Hematology and Oncology

## 2017-08-01 ENCOUNTER — Encounter: Payer: Self-pay | Admitting: Hematology and Oncology

## 2017-08-01 VITALS — BP 115/75 | HR 81 | Resp 18

## 2017-08-01 DIAGNOSIS — D638 Anemia in other chronic diseases classified elsewhere: Secondary | ICD-10-CM | POA: Insufficient documentation

## 2017-08-01 DIAGNOSIS — N179 Acute kidney failure, unspecified: Secondary | ICD-10-CM

## 2017-08-01 DIAGNOSIS — N189 Chronic kidney disease, unspecified: Secondary | ICD-10-CM | POA: Insufficient documentation

## 2017-08-01 DIAGNOSIS — C541 Malignant neoplasm of endometrium: Secondary | ICD-10-CM | POA: Insufficient documentation

## 2017-08-01 DIAGNOSIS — I82412 Acute embolism and thrombosis of left femoral vein: Secondary | ICD-10-CM | POA: Insufficient documentation

## 2017-08-01 DIAGNOSIS — Z79899 Other long term (current) drug therapy: Secondary | ICD-10-CM | POA: Insufficient documentation

## 2017-08-01 DIAGNOSIS — E876 Hypokalemia: Secondary | ICD-10-CM

## 2017-08-01 DIAGNOSIS — E440 Moderate protein-calorie malnutrition: Secondary | ICD-10-CM | POA: Diagnosis not present

## 2017-08-01 DIAGNOSIS — R634 Abnormal weight loss: Secondary | ICD-10-CM | POA: Insufficient documentation

## 2017-08-01 DIAGNOSIS — N950 Postmenopausal bleeding: Secondary | ICD-10-CM | POA: Diagnosis not present

## 2017-08-01 DIAGNOSIS — Z5111 Encounter for antineoplastic chemotherapy: Secondary | ICD-10-CM | POA: Insufficient documentation

## 2017-08-01 DIAGNOSIS — G893 Neoplasm related pain (acute) (chronic): Secondary | ICD-10-CM | POA: Diagnosis not present

## 2017-08-01 DIAGNOSIS — E860 Dehydration: Secondary | ICD-10-CM | POA: Insufficient documentation

## 2017-08-01 DIAGNOSIS — Z7901 Long term (current) use of anticoagulants: Secondary | ICD-10-CM | POA: Diagnosis not present

## 2017-08-01 LAB — CBC WITH DIFFERENTIAL/PLATELET
BASOS PCT: 0 %
Basophils Absolute: 0 10*3/uL (ref 0.0–0.1)
Eosinophils Absolute: 0 10*3/uL (ref 0.0–0.5)
Eosinophils Relative: 0 %
HEMATOCRIT: 32 % — AB (ref 34.8–46.6)
HEMOGLOBIN: 10.3 g/dL — AB (ref 11.6–15.9)
LYMPHS ABS: 3.6 10*3/uL — AB (ref 0.9–3.3)
Lymphocytes Relative: 25 %
MCH: 28.7 pg (ref 25.1–34.0)
MCHC: 32.2 g/dL (ref 31.5–36.0)
MCV: 89.1 fL (ref 79.5–101.0)
MONOS PCT: 7 %
Monocytes Absolute: 0.9 10*3/uL (ref 0.1–0.9)
NEUTROS PCT: 68 %
Neutro Abs: 9.7 10*3/uL — ABNORMAL HIGH (ref 1.5–6.5)
Platelets: 344 10*3/uL (ref 145–400)
RBC: 3.59 MIL/uL — ABNORMAL LOW (ref 3.70–5.45)
RDW: 14.9 % — ABNORMAL HIGH (ref 11.2–14.5)
WBC: 14.2 10*3/uL — ABNORMAL HIGH (ref 3.9–10.3)

## 2017-08-01 LAB — COMPREHENSIVE METABOLIC PANEL
ALBUMIN: 2.4 g/dL — AB (ref 3.5–5.0)
ALT: 8 U/L (ref 0–55)
ANION GAP: 13 — AB (ref 3–11)
AST: 12 U/L (ref 5–34)
Alkaline Phosphatase: 134 U/L (ref 40–150)
BILIRUBIN TOTAL: 0.4 mg/dL (ref 0.2–1.2)
BUN: 26 mg/dL (ref 7–26)
CALCIUM: 9.4 mg/dL (ref 8.4–10.4)
CO2: 28 mmol/L (ref 22–29)
Chloride: 99 mmol/L (ref 98–109)
Creatinine, Ser: 1.64 mg/dL — ABNORMAL HIGH (ref 0.60–1.10)
GFR calc Af Amer: 36 mL/min — ABNORMAL LOW (ref >60)
GFR calc non Af Amer: 31 mL/min — ABNORMAL LOW (ref >60)
GLUCOSE: 92 mg/dL (ref 70–140)
Potassium: 3.5 mmol/L (ref 3.5–5.1)
Sodium: 140 mmol/L (ref 136–145)
TOTAL PROTEIN: 7.1 g/dL (ref 6.4–8.3)

## 2017-08-01 LAB — SAMPLE TO BLOOD BANK

## 2017-08-01 LAB — MAGNESIUM: Magnesium: 1.5 mg/dL (ref 1.5–2.5)

## 2017-08-01 MED ORDER — PALONOSETRON HCL INJECTION 0.25 MG/5ML
INTRAVENOUS | Status: AC
  Filled 2017-08-01: qty 5

## 2017-08-01 MED ORDER — SODIUM CHLORIDE 0.9% FLUSH
10.0000 mL | Freq: Once | INTRAVENOUS | Status: AC
  Administered 2017-08-01: 10 mL
  Filled 2017-08-01: qty 10

## 2017-08-01 MED ORDER — SODIUM CHLORIDE 0.9 % IV SOLN
Freq: Once | INTRAVENOUS | Status: AC
  Administered 2017-08-01: 13:00:00 via INTRAVENOUS

## 2017-08-01 MED ORDER — SODIUM CHLORIDE 0.9% FLUSH
10.0000 mL | INTRAVENOUS | Status: DC | PRN
  Administered 2017-08-01: 10 mL
  Filled 2017-08-01: qty 10

## 2017-08-01 MED ORDER — PEGFILGRASTIM INJECTION 6 MG/0.6ML ~~LOC~~: 6.0000 mg | PREFILLED_SYRINGE | Freq: Once | SUBCUTANEOUS | Status: DC

## 2017-08-01 MED ORDER — SODIUM CHLORIDE 0.9 % IV SOLN
INTRAVENOUS | Status: AC
  Administered 2017-08-01: 11:00:00 via INTRAVENOUS

## 2017-08-01 MED ORDER — PALONOSETRON HCL INJECTION 0.25 MG/5ML
0.2500 mg | Freq: Once | INTRAVENOUS | Status: AC
  Administered 2017-08-01: 0.25 mg via INTRAVENOUS

## 2017-08-01 MED ORDER — SODIUM CHLORIDE 0.9 % IV SOLN
Freq: Once | INTRAVENOUS | Status: AC
  Administered 2017-08-01: 13:00:00 via INTRAVENOUS
  Filled 2017-08-01: qty 5

## 2017-08-01 MED ORDER — HEPARIN SOD (PORK) LOCK FLUSH 100 UNIT/ML IV SOLN
500.0000 [IU] | Freq: Once | INTRAVENOUS | Status: AC | PRN
  Administered 2017-08-01: 500 [IU]
  Filled 2017-08-01: qty 5

## 2017-08-01 MED ORDER — DOXORUBICIN HCL CHEMO IV INJECTION 2 MG/ML
40.0000 mg/m2 | Freq: Once | INTRAVENOUS | Status: AC
  Administered 2017-08-01: 66 mg via INTRAVENOUS
  Filled 2017-08-01: qty 33

## 2017-08-01 NOTE — Assessment & Plan Note (Signed)
She has received recent blood transfusion She is not symptomatic today She has minor bleeding and we will observe for now

## 2017-08-01 NOTE — Assessment & Plan Note (Signed)
She has acute on chronic renal failure likely secondary to poor oral fluid intake and dehydration It is not safe to proceed with cisplatin I would give her IV fluid resuscitation today prior to chemotherapy

## 2017-08-01 NOTE — Assessment & Plan Note (Signed)
I discussed extensively with her son She is on anticoagulation therapy and the risk of bleeding has to be balanced with the benefits of anticoagulation treatment For now, we will continue Xarelto at reduced dose

## 2017-08-01 NOTE — Progress Notes (Signed)
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[facility]) She tolerated treatment very poorly with progressive weight loss and acute on chronic renal failure It is not safe to proceed with cisplatin I will hold cisplatin today I would proceed with Doxil only I plan to repeat imaging study in June to assess response to treatment  Anemia, chronic disease She has received recent blood transfusion She is not symptomatic today She has minor bleeding and we will observe for now  Acute prerenal failure [facility]) She has acute on chronic renal failure likely secondary to poor oral fluid intake and dehydration It is not safe to proceed with cisplatin I would give her IV fluid resuscitation today prior to chemotherapy  Protein-calorie malnutrition, moderate ([place]) She will continue on dexamethasone to control nausea I encouraged her to increase oral intake as tolerated She will be seeing a dietitian today   Left femoral vein DVT ([place]) I discussed extensively with her son She is on anticoagulation therapy and the risk of bleeding has to be balanced with the benefits of anticoagulation treatment For now, we will continue Xarelto at reduced dose  Cancer associated pain She has moderate cancer pain, currently well controlled She will continue taking hydrocodone to 10 mg every 4- 6 hours as needed for pain Her pain is stable I warned her about risk of possible constipation   No orders of the defined types were placed in this encounter.   INTERVAL HISTORY: Please see below for problem oriented charting. She returns for further follow-up with her son Her son reported that the patient continues to have intermittent vaginal bleeding but not significant Her oral intake remained poor She continues to have progressive weight loss She denies nausea, vomiting or  constipation Denies peripheral neuropathy No chest discomfort  SUMMARY OF ONCOLOGIC HISTORY: Oncology History   Focally ER positive MSI stable disease     Endometrial/uterine adenocarcinoma ([place])   08/01/2016 Initial Diagnosis    She was admitted to  [facility] on 08/01/16 with postmenopausal vaginal bleeding lasting 10 days. However, she was transferred to [facility] on 08/06/16 for suicidal ideation. There, GYN was consulted and ordered biopsy. Pt was transferred to medical unit for blood transfusion.       08/10/2016 Imaging    US pelvis:  The uterus is prominent and heterogeneous measuring 10.9 x 6.3 x 6.1 cm. There is a probable anterior exophytic fundal fibroid measuring 4.5 cm. The endometrial stripe is normal in thickness at 6.4 mm. I see no fluid in the canal  Right ovary is not visible. Left ovary 2.3 x 1.7 x 1.9 cm with normal echogenicity. Color flow and spectral Doppler: Normal arterial inflow and venous outflow.  There is no significant free fluid.   I see no adnexal masses.      08/11/2016 - 08/15/2016 Hospital Admission    She was admitted voluntarily to Psychiatry unit 08/06/2016 for suicidal ideation.She was evaluated in an outside facility due to vaginal bleeding and endometrial and endocervical biopsies performed. Pathology revealed a poorly differentiated cancer with positive estrogen receptors felt to be suggestive of endometrial cancer. The patient received 2 units of prbcs total during her stay for anemia.        08/11/2016 Pathology Results       1. Endocervix, curettings and polyp. (*please see comment below). --High-grade carcinoma, undifferentiated.  2. Endometrial biopsy:(*please see  comment below). --High-grade carcinoma, undifferentiated. --Weakly positive for estrogen receptors suggestive of endometrial origin; Negative for progesterone receptors and neutral mucin..       08/14/2016 Imaging    CT imaging 1. Uterine  mass and abnormality consistent with clinical history of endometrial carcinoma as described above with bilateral pelvic and retroperitoneal adenopathy consistent with nodal metastasis. 2. No evidence of nonnodal distant metastasis. 3. Nonspecific incidental finding of hypertrophy of the pylorus muscle. 4. incidental finding of cholelithiasis      09/04/2016 Imaging    CT chest: 1. 4 mm nodule in the anterior aspect of the right upper lobe. This is highly nonspecific. Although metastatic disease is not excluded, it is not strongly favored. Attention under routine followup examinations is recommended to ensure the stability or resolution of this finding. 2. Trace right pleural effusion lying dependently is simple in appearance. 3. Mild cardiomegaly.      09/05/2016 Procedure    Successful placement of a right internal jugular approach power injectable Port-A-Cath. The catheter is ready for immediate use.      09/05/2016 Tumor Marker    Patient's tumor was tested for the following markers: CA125 Results of the tumor marker test revealed 1150      09/09/2016 - 12/30/2016 Chemotherapy    She received chemotherapy with carboplatin and Taxol x 6 cycles      09/30/2016 Tumor Marker    Patient's tumor was tested for the following markers: CA125 Results of the tumor marker test revealed 338.1      10/28/2016 Tumor Marker    Patient's tumor was tested for the following markers: CA125 Results of the tumor marker test revealed 210.9      11/08/2016 Imaging    CT scan abdomen and pelvis 1. Soft tissue nodularity throughout the omental and perihepatic fat worrisome for peritoneal carcinomatosis. No ascites. 2. No other definitive evidence of metastatic disease. There are prominent left pelvic sidewall and right inguinal lymph nodes. 3. Nonspecific findings in the uterus, likely related to treated tumor and fibroids. Prior pelvic imaging unavailable. 4. Cholelithiasis.      12/09/2016 Tumor Marker     Patient's tumor was tested for the following markers: CA125 Results of the tumor marker test revealed 98.1      12/30/2016 Tumor Marker    Patient's tumor was tested for the following markers: CA125 Results of the tumor marker test revealed 106.1      01/07/2017 Imaging    1. Stable soft tissue nodularity along the omentum suspicious for peritoneal spread of tumor. Stable appearance of nodularity along the uterine fundus and indistinctness along the right adnexa, some of this may be due to fibroids. 2. The previously borderline enlarged left external iliac lymph node is now normal in size. 3. Stable 4 mm ill-defined right upper lobe pulmonary nodule anteriorly, this may well in depth being benign but merits surveillance. 4. Cholelithiasis.      03/03/2017 Tumor Marker    Patient's tumor was tested for the following markers: CA125 Results of the tumor marker test revealed 694.5      03/07/2017 Imaging    1. Clear interval progression of peritoneal and omental disease as above. Diffuse mesenteric thickening and nodularity has progressed. 2. New juxta diaphragmatic lymph nodes compatible with metastatic disease. 3. Cholelithiasis. 4. Uterine enlargement with probable fibroid change. Ill-defined hypovascular lesion in the lower uterine segment/cervix may represent primary malignancy although this is difficult to assess by CT.      03/18/2017 Imaging  ECHO: Normal LV size with EF 55-60%. Normal RV size and systolic function. Mild MR.      06/06/2017 Tumor Marker    Patient's tumor was tested for the following markers: CA125 Results of the tumor marker test revealed 710.5      06/20/2017 Imaging    1. Slightly improved omental disease. Scattered peritoneal surface disease and mesenteric disease appears relatively stable. 2. New cystic lesion peripherally in the right hepatic lobe could be treated peritoneal surface disease. 3. Persistent extensive endometrial neoplasm involving the  lower uterine segment and cervix. 4. Cholelithiasis. 5. Moderate-sized right effusion with overlying atelectasis but no enhancing pleural nodules or pulmonary nodules. 6. Deep venous thrombosis in the left common femoral vein.      06/20/2017 Imaging    LV EF: 60% -  65%      07/01/2017 Tumor Marker    Patient's tumor was tested for the following markers: CA125 Results of the tumor marker test revealed 493.4       REVIEW OF SYSTEMS:   Constitutional: Denies fevers, chills  Eyes: Denies blurriness of vision Ears, nose, mouth, throat, and face: Denies mucositis or sore throat Respiratory: Denies cough, dyspnea or wheezes Cardiovascular: Denies palpitation, chest discomfort or lower extremity swelling Gastrointestinal:  Denies nausea, heartburn or change in bowel habits Skin: Denies abnormal skin rashes Lymphatics: Denies new lymphadenopathy or easy bruising Neurological:Denies numbness, tingling or new weaknesses Behavioral/Psych: Mood is stable, no new changes  All other systems were reviewed with the patient and are negative.  I have reviewed the past medical history, past surgical history, social history and family history with the patient and they are unchanged from previous note.  ALLERGIES:  has No Known Allergies.  MEDICATIONS:  Current Outpatient Medications  Medication Sig Dispense Refill  . Rivaroxaban (XARELTO) 15 MG TABS tablet Take 15 mg by mouth daily.    Marland Kitchen dexamethasone (DECADRON) 4 MG tablet Take 1 tablet (4 mg total) by mouth 2 (two) times daily with a meal. 60 tablet 1  . divalproex (DEPAKOTE ER) 500 MG 24 hr tablet Take 500 mg by mouth at bedtime.    Marland Kitchen HYDROcodone-acetaminophen (NORCO) 10-325 MG tablet Take 1 tablet by mouth every 4 (four) hours as needed. 60 tablet 0  . magnesium oxide (MAG-OX) 400 (241.3 Mg) MG tablet Take 1 tablet (400 mg total) by mouth 2 (two) times daily. 60 tablet 11  . ondansetron (ZOFRAN) 8 MG tablet Take 1 tablet (8 mg total) by  mouth every 8 (eight) hours as needed for nausea. 30 tablet 3  . potassium chloride SA (K-DUR,KLOR-CON) 20 MEQ tablet Take 1 tablet (20 mEq total) by mouth 2 (two) times daily. 28 tablet 0  . prochlorperazine (COMPAZINE) 10 MG tablet Take 1 tablet (10 mg total) by mouth every 6 (six) hours as needed for nausea or vomiting. 30 tablet 1   No current facility-administered medications for this visit.    Facility-Administered Medications Ordered in Other Visits  Medication Dose Route Frequency Provider Last Rate Last Dose  . magnesium sulfate IVPB 4 g 100 mL  4 g Intravenous Once Mabell Esguerra, MD      . sodium chloride flush (NS) 0.9 % injection 10 mL  10 mL Intracatheter PRN Alvy Bimler, Rosamund Nyland, MD   10 mL at 08/01/17 1405    PHYSICAL EXAMINATION: ECOG PERFORMANCE STATUS: 2 - Symptomatic, <50% confined to bed  Vitals:   08/01/17 1007  BP: 107/78  Pulse: (!) 118  Resp: 18  Temp: 98  F (36.7 C)  SpO2: 98%   Filed Weights   08/01/17 1007  Weight: 140 lb 9.6 oz (63.8 kg)    GENERAL:alert, no distress and comfortable.  She looks thin.  She has lost a lot of weight SKIN: skin color, texture, turgor are normal, no rashes or significant lesions EYES: normal, Conjunctiva are pink and non-injected, sclera clear OROPHARYNX:no exudate, no erythema and lips, buccal mucosa, and tongue normal  NECK: supple, thyroid normal size, non-tender, without nodularity LYMPH:  no palpable lymphadenopathy in the cervical, axillary or inguinal LUNGS: clear to auscultation and percussion with normal breathing effort HEART: Tachycardia, no murmurs with mild bilateral lower extremity edema ABDOMEN:abdomen soft, non-tender and normal bowel sounds Musculoskeletal:no cyanosis of digits and no clubbing  NEURO: alert & oriented x 3 with fluent speech, no focal motor/sensory deficits  LABORATORY DATA:  I have reviewed the data as listed    Component Value Date/Time   NA 140 08/01/2017 0929   NA 135 (L) 04/23/2017 1359    K 3.5 08/01/2017 0929   K 3.8 04/23/2017 1359   CL 99 08/01/2017 0929   CO2 28 08/01/2017 0929   CO2 27 04/23/2017 1359   GLUCOSE 92 08/01/2017 0929   GLUCOSE 98 04/23/2017 1359   BUN 26 08/01/2017 0929   BUN 10.8 04/23/2017 1359   CREATININE 1.64 (H) 08/01/2017 0929   CREATININE 0.8 04/23/2017 1359   CALCIUM 9.4 08/01/2017 0929   CALCIUM 8.6 04/23/2017 1359   PROT 7.1 08/01/2017 0929   PROT 7.5 04/23/2017 1359   ALBUMIN 2.4 (L) 08/01/2017 0929   ALBUMIN 2.6 (L) 04/23/2017 1359   AST 12 08/01/2017 0929   AST 38 (H) 04/23/2017 1359   ALT 8 08/01/2017 0929   ALT 22 04/23/2017 1359   ALKPHOS 134 08/01/2017 0929   ALKPHOS 117 04/23/2017 1359   BILITOT 0.4 08/01/2017 0929   BILITOT 0.62 04/23/2017 1359   GFRNONAA 31 (L) 08/01/2017 0929   GFRAA 36 (L) 08/01/2017 0929    No results found for: SPEP, UPEP  Lab Results  Component Value Date   WBC 14.2 (H) 08/01/2017   NEUTROABS 9.7 (H) 08/01/2017   HGB 10.3 (L) 08/01/2017   HCT 32.0 (L) 08/01/2017   MCV 89.1 08/01/2017   PLT 344 08/01/2017      Chemistry      Component Value Date/Time   NA 140 08/01/2017 0929   NA 135 (L) 04/23/2017 1359   K 3.5 08/01/2017 0929   K 3.8 04/23/2017 1359   CL 99 08/01/2017 0929   CO2 28 08/01/2017 0929   CO2 27 04/23/2017 1359   BUN 26 08/01/2017 0929   BUN 10.8 04/23/2017 1359   CREATININE 1.64 (H) 08/01/2017 0929   CREATININE 0.8 04/23/2017 1359      Component Value Date/Time   CALCIUM 9.4 08/01/2017 0929   CALCIUM 8.6 04/23/2017 1359   ALKPHOS 134 08/01/2017 0929   ALKPHOS 117 04/23/2017 1359   AST 12 08/01/2017 0929   AST 38 (H) 04/23/2017 1359   ALT 8 08/01/2017 0929   ALT 22 04/23/2017 1359   BILITOT 0.4 08/01/2017 0929   BILITOT 0.62 04/23/2017 1359      All questions were answered. The patient knows to call the clinic with any problems, questions or concerns. No barriers to learning was detected.  I spent 30 minutes counseling the patient face to face. The total  time spent in the appointment was 40 minutes and more than 50% was on counseling and  review of test results  Heath Lark, MD 08/01/2017 2:49 PM

## 2017-08-01 NOTE — Assessment & Plan Note (Signed)
She tolerated treatment very poorly with progressive weight loss and acute on chronic renal failure It is not safe to proceed with cisplatin I will hold cisplatin today I would proceed with Doxil only I plan to repeat imaging study in June to assess response to treatment

## 2017-08-01 NOTE — Progress Notes (Signed)
67 year old female diagnosed with uterine cancer.  Past medical history includes chronic kidney disease stage III, DVT, paranoid schizophrenia, and hypertension.  Medications include magnesium oxide, Zofran, and Compazine.  Labs include BUN 35, creatinine 1.44, and albumin 2.2.  Height: 64 inches. Weight: 140.6 pounds on April 12. Usual body weight: 211 pounds in May 2018. BMI: 24.13.  Patient was identified to be at risk for malnutrition on the MST secondary to poor appetite and weight loss. Patient reports nausea has resolved. States she is weak. She is determined to eat more and states she has found cheeseburgers in the frozen food section and knows this will "put her over the top." Also states she is listening to Dr. Alvy Bimler and understands she needs to gain weight. States her son bought her Ensure, chocolate, which she likes.  Nutrition diagnosis:  Severe malnutrition in the context of chronic illness secondary to 33% weight loss over 1 year and severe depletion of body fat and muscle mass.  Intervention: I educated patient on the importance of smaller more frequent meals and snacks. Suggested she drink Ensure Plus twice daily to 3 times daily between meals. Encourage patient to eat food she enjoys while focusing on high calories choices. I provided support and encouragement. I provided patient with fact sheets and my contact information.  Monitoring, evaluation, goals: Patient will tolerate increased calories and protein to minimize further weight loss.  Next visit: Friday, May 10 during infusion.  **Disclaimer: This note was dictated with voice recognition software. Similar sounding words can inadvertently be transcribed and this note may contain transcription errors which may not have been corrected upon publication of note.**

## 2017-08-01 NOTE — Patient Instructions (Signed)
[facility] Discharge Instructions for Patients Receiving Chemotherapy  Today you received the following chemotherapy agents Adriamycin  To help prevent nausea and vomiting after your treatment, we encourage you to take your nausea medication as directed.    If you develop nausea and vomiting that is not controlled by your nausea medication, call the clinic.   BELOW ARE SYMPTOMS THAT SHOULD BE REPORTED IMMEDIATELY:  *FEVER GREATER THAN 100.5 F  *CHILLS WITH OR WITHOUT FEVER  NAUSEA AND VOMITING THAT IS NOT CONTROLLED WITH YOUR NAUSEA MEDICATION  *UNUSUAL SHORTNESS OF BREATH  *UNUSUAL BRUISING OR BLEEDING  TENDERNESS IN MOUTH AND THROAT WITH OR WITHOUT PRESENCE OF ULCERS  *URINARY PROBLEMS  *BOWEL PROBLEMS  UNUSUAL RASH Items with * indicate a potential emergency and should be followed up as soon as possible.  Feel free to call the clinic should you have any questions or concerns. The clinic phone number is [phone].  Please show the [place] at check-in to the Emergency Department and triage nurse.   Dehydration, Adult Dehydration is a condition in which there is not enough fluid or water in the body. This happens when you lose more fluids than you take in. Important organs, such as the kidneys, brain, and heart, cannot function without a proper amount of fluids. Any loss of fluids from the body can lead to dehydration. Dehydration can range from mild to severe. This condition should be treated right away to prevent it from becoming severe. What are the causes? This condition may be caused by:  Vomiting.  Diarrhea.  Excessive sweating, such as from heat exposure or exercise.  Not drinking enough fluid, especially: ? When ill. ? While doing activity that requires a lot of energy.  Excessive urination.  Fever.  Infection.  Certain medicines, such as medicines that cause the body to lose excess fluid (diuretics).  Inability to access  safe drinking water.  Reduced physical ability to get adequate water and food.  What increases the risk? This condition is more likely to develop in people:  Who have a poorly controlled long-term (chronic) illness, such as diabetes, heart disease, or kidney disease.  Who are age 88 or older.  Who are disabled.  Who live in a place with high altitude.  Who play endurance sports.  What are the signs or symptoms? Symptoms of mild dehydration may include:  Thirst.  Dry lips.  Slightly dry mouth.  Dry, warm skin.  Dizziness. Symptoms of moderate dehydration may include:  Very dry mouth.  Muscle cramps.  Dark urine. Urine may be the color of tea.  Decreased urine production.  Decreased tear production.  Heartbeat that is irregular or faster than normal (palpitations).  Headache.  Light-headedness, especially when you stand up from a sitting position.  Fainting (syncope). Symptoms of severe dehydration may include:  Changes in skin, such as: ? Cold and clammy skin. ? Blotchy (mottled) or pale skin. ? Skin that does not quickly return to normal after being lightly pinched and released (poor skin turgor).  Changes in body fluids, such as: ? Extreme thirst. ? No tear production. ? Inability to sweat when body temperature is high, such as in hot weather. ? Very little urine production.  Changes in vital signs, such as: ? Weak pulse. ? Pulse that is more than 100 beats a minute when sitting still. ? Rapid breathing. ? Low blood pressure.  Other changes, such as: ? Sunken eyes. ? Cold hands and feet. ? Confusion. ?  Lack of energy (lethargy). ? Difficulty waking up from sleep. ? Short-term weight loss. ? Unconsciousness. How is this diagnosed? This condition is diagnosed based on your symptoms and a physical exam. Blood and urine tests may be done to help confirm the diagnosis. How is this treated? Treatment for this condition depends on the severity.  Mild or moderate dehydration can often be treated at home. Treatment should be started right away. Do not wait until dehydration becomes severe. Severe dehydration is an emergency and it needs to be treated in a hospital. Treatment for mild dehydration may include:  Drinking more fluids.  Replacing salts and minerals in your blood (electrolytes) that you may have lost. Treatment for moderate dehydration may include:  Drinking an oral rehydration solution (ORS). This is a drink that helps you replace fluids and electrolytes (rehydrate). It can be found at pharmacies and retail stores. Treatment for severe dehydration may include:  Receiving fluids through an IV tube.  Receiving an electrolyte solution through a feeding tube that is passed through your nose and into your stomach (nasogastric tube, or NG tube).  Correcting any abnormalities in electrolytes.  Treating the underlying cause of dehydration. Follow these instructions at home:  If directed by your health care provider, drink an ORS: ? Make an ORS by following instructions on the package. ? Start by drinking small amounts, about  cup (120 mL) every 5-10 minutes. ? Slowly increase how much you drink until you have taken the amount recommended by your health care provider.  Drink enough clear fluid to keep your urine clear or pale yellow. If you were told to drink an ORS, finish the ORS first, then start slowly drinking other clear fluids. Drink fluids such as: ? Water. Do not drink only water. Doing that can lead to having too little salt (sodium) in the body (hyponatremia). ? Ice chips. ? Fruit juice that you have added water to (diluted fruit juice). ? Low-calorie sports drinks.  Avoid: ? Alcohol. ? Drinks that contain a lot of sugar. These include high-calorie sports drinks, fruit juice that is not diluted, and soda. ? Caffeine. ? Foods that are greasy or contain a lot of fat or sugar.  Take over-the-counter and  prescription medicines only as told by your health care provider.  Do not take sodium tablets. This can lead to having too much sodium in the body (hypernatremia).  Eat foods that contain a healthy balance of electrolytes, such as bananas, oranges, potatoes, tomatoes, and spinach.  Keep all follow-up visits as told by your health care provider. This is important. Contact a health care provider if:  You have abdominal pain that: ? Gets worse. ? Stays in one area (localizes).  You have a rash.  You have a stiff neck.  You are more irritable than usual.  You are sleepier or more difficult to wake up than usual.  You feel weak or dizzy.  You feel very thirsty.  You have urinated only a small amount of very dark urine over 6-8 hours. Get help right away if:  You have symptoms of severe dehydration.  You cannot drink fluids without vomiting.  Your symptoms get worse with treatment.  You have a fever.  You have a severe headache.  You have vomiting or diarrhea that: ? Gets worse. ? Does not go away.  You have blood or green matter (bile) in your vomit.  You have blood in your stool. This may cause stool to look black and tarry.  You have not urinated in 6-8 hours.  You faint.  Your heart rate while sitting still is over 100 beats a minute.  You have trouble breathing. This information is not intended to replace advice given to you by your health care provider. Make sure you discuss any questions you have with your health care provider. Document Released: 04/08/2005 Document Revised: 11/03/2015 Document Reviewed: 06/02/2015 Elsevier Interactive Patient Education  [employer].

## 2017-08-01 NOTE — Assessment & Plan Note (Signed)
She will continue on dexamethasone to control nausea I encouraged her to increase oral intake as tolerated She will be seeing a dietitian today

## 2017-08-01 NOTE — Assessment & Plan Note (Signed)
She has moderate cancer pain, currently well controlled She will continue taking hydrocodone to 10 mg every 4- 6 hours as needed for pain Her pain is stable I warned her about risk of possible constipation

## 2017-08-01 NOTE — Telephone Encounter (Signed)
Gave patient AVs and calendar of upcoming April and may appointments.  °

## 2017-08-02 LAB — CA 125: Cancer Antigen (CA) 125: 955.6 U/mL — ABNORMAL HIGH (ref 0.0–38.1)

## 2017-08-08 ENCOUNTER — Other Ambulatory Visit: Payer: Self-pay | Admitting: Hematology and Oncology

## 2017-08-11 ENCOUNTER — Inpatient Hospital Stay (HOSPITAL_COMMUNITY)
Admission: EM | Admit: 2017-08-11 | Discharge: 2017-08-15 | DRG: 682 | Disposition: A | Discharge status: Home Health | Payer: Medicare Other | Attending: Internal Medicine | Admitting: Internal Medicine

## 2017-08-11 ENCOUNTER — Encounter (HOSPITAL_COMMUNITY): Payer: Self-pay | Admitting: *Deleted

## 2017-08-11 ENCOUNTER — Other Ambulatory Visit: Payer: Self-pay

## 2017-08-11 ENCOUNTER — Emergency Department (HOSPITAL_COMMUNITY): Payer: Medicare Other

## 2017-08-11 DIAGNOSIS — E46 Unspecified protein-calorie malnutrition: Secondary | ICD-10-CM

## 2017-08-11 DIAGNOSIS — Z7952 Long term (current) use of systemic steroids: Secondary | ICD-10-CM

## 2017-08-11 DIAGNOSIS — Z7901 Long term (current) use of anticoagulants: Secondary | ICD-10-CM | POA: Diagnosis not present

## 2017-08-11 DIAGNOSIS — N179 Acute kidney failure, unspecified: Secondary | ICD-10-CM | POA: Diagnosis not present

## 2017-08-11 DIAGNOSIS — C55 Malignant neoplasm of uterus, part unspecified: Secondary | ICD-10-CM | POA: Diagnosis present

## 2017-08-11 DIAGNOSIS — I129 Hypertensive chronic kidney disease with stage 1 through stage 4 chronic kidney disease, or unspecified chronic kidney disease: Secondary | ICD-10-CM | POA: Diagnosis present

## 2017-08-11 DIAGNOSIS — Z800 Family history of malignant neoplasm of digestive organs: Secondary | ICD-10-CM | POA: Diagnosis not present

## 2017-08-11 DIAGNOSIS — F329 Major depressive disorder, single episode, unspecified: Secondary | ICD-10-CM

## 2017-08-11 DIAGNOSIS — E43 Unspecified severe protein-calorie malnutrition: Secondary | ICD-10-CM | POA: Diagnosis present

## 2017-08-11 DIAGNOSIS — N3000 Acute cystitis without hematuria: Secondary | ICD-10-CM

## 2017-08-11 DIAGNOSIS — N390 Urinary tract infection, site not specified: Secondary | ICD-10-CM | POA: Diagnosis present

## 2017-08-11 DIAGNOSIS — T426X6A Underdosing of other antiepileptic and sedative-hypnotic drugs, initial encounter: Secondary | ICD-10-CM | POA: Diagnosis present

## 2017-08-11 DIAGNOSIS — E860 Dehydration: Secondary | ICD-10-CM | POA: Diagnosis present

## 2017-08-11 DIAGNOSIS — N183 Chronic kidney disease, stage 3 (moderate): Secondary | ICD-10-CM | POA: Diagnosis present

## 2017-08-11 DIAGNOSIS — F319 Bipolar disorder, unspecified: Secondary | ICD-10-CM | POA: Diagnosis present

## 2017-08-11 DIAGNOSIS — D6481 Anemia due to antineoplastic chemotherapy: Secondary | ICD-10-CM | POA: Diagnosis not present

## 2017-08-11 DIAGNOSIS — T451X5A Adverse effect of antineoplastic and immunosuppressive drugs, initial encounter: Secondary | ICD-10-CM | POA: Diagnosis present

## 2017-08-11 DIAGNOSIS — R5381 Other malaise: Secondary | ICD-10-CM | POA: Diagnosis present

## 2017-08-11 DIAGNOSIS — D701 Agranulocytosis secondary to cancer chemotherapy: Secondary | ICD-10-CM | POA: Diagnosis present

## 2017-08-11 DIAGNOSIS — E876 Hypokalemia: Secondary | ICD-10-CM | POA: Diagnosis present

## 2017-08-11 DIAGNOSIS — Z86718 Personal history of other venous thrombosis and embolism: Secondary | ICD-10-CM | POA: Diagnosis not present

## 2017-08-11 DIAGNOSIS — G620 Drug-induced polyneuropathy: Secondary | ICD-10-CM | POA: Diagnosis present

## 2017-08-11 DIAGNOSIS — R000 Tachycardia, unspecified: Secondary | ICD-10-CM | POA: Diagnosis present

## 2017-08-11 DIAGNOSIS — Y92009 Unspecified place in unspecified non-institutional (private) residence as the place of occurrence of the external cause: Secondary | ICD-10-CM

## 2017-08-11 DIAGNOSIS — D638 Anemia in other chronic diseases classified elsewhere: Secondary | ICD-10-CM | POA: Diagnosis present

## 2017-08-11 LAB — CBC WITH DIFFERENTIAL/PLATELET
BASOS PCT: 0 %
Basophils Absolute: 0 10*3/uL (ref 0.0–0.1)
EOS PCT: 0 %
Eosinophils Absolute: 0 10*3/uL (ref 0.0–0.7)
HEMATOCRIT: 28.4 % — AB (ref 36.0–46.0)
HEMOGLOBIN: 9.1 g/dL — AB (ref 12.0–15.0)
LYMPHS PCT: 59 %
Lymphs Abs: 1.7 10*3/uL (ref 0.7–4.0)
MCH: 28.4 pg (ref 26.0–34.0)
MCHC: 32 g/dL (ref 30.0–36.0)
MCV: 88.8 fL (ref 78.0–100.0)
MONO ABS: 0.3 10*3/uL (ref 0.1–1.0)
Monocytes Relative: 9 %
NEUTROS PCT: 32 %
Neutro Abs: 0.9 10*3/uL — ABNORMAL LOW (ref 1.7–7.7)
Platelets: 153 10*3/uL (ref 150–400)
RBC: 3.2 MIL/uL — ABNORMAL LOW (ref 3.87–5.11)
RDW: 14.8 % (ref 11.5–15.5)
WBC: 2.9 10*3/uL — ABNORMAL LOW (ref 4.0–10.5)

## 2017-08-11 LAB — COMPREHENSIVE METABOLIC PANEL
ALK PHOS: 122 U/L (ref 38–126)
ALT: 14 U/L (ref 14–54)
AST: 20 U/L (ref 15–41)
Albumin: 2 g/dL — ABNORMAL LOW (ref 3.5–5.0)
Anion gap: 13 (ref 5–15)
BILIRUBIN TOTAL: 0.9 mg/dL (ref 0.3–1.2)
BUN: 32 mg/dL — ABNORMAL HIGH (ref 6–20)
CALCIUM: 8.2 mg/dL — AB (ref 8.9–10.3)
CO2: 26 mmol/L (ref 22–32)
CREATININE: 1.6 mg/dL — AB (ref 0.44–1.00)
Chloride: 101 mmol/L (ref 101–111)
GFR calc Af Amer: 37 mL/min — ABNORMAL LOW (ref >60)
GFR, EST NON AFRICAN AMERICAN: 32 mL/min — AB (ref >60)
Glucose, Bld: 106 mg/dL — ABNORMAL HIGH (ref 65–99)
Potassium: 3.4 mmol/L — ABNORMAL LOW (ref 3.5–5.1)
Sodium: 140 mmol/L (ref 135–145)
Total Protein: 6.1 g/dL — ABNORMAL LOW (ref 6.5–8.1)

## 2017-08-11 LAB — LACTIC ACID, PLASMA: LACTIC ACID, VENOUS: 1.2 mmol/L (ref 0.5–1.9)

## 2017-08-11 LAB — TROPONIN I: TROPONIN I: 0.05 ng/mL — AB (ref <0.03)

## 2017-08-11 LAB — URINALYSIS, ROUTINE W REFLEX MICROSCOPIC
Bilirubin Urine: NEGATIVE
GLUCOSE, UA: NEGATIVE mg/dL
Ketones, ur: NEGATIVE mg/dL
Nitrite: NEGATIVE
PH: 5 (ref 5.0–8.0)
Protein, ur: 100 mg/dL — AB
Specific Gravity, Urine: 1.015 (ref 1.005–1.030)

## 2017-08-11 LAB — VALPROIC ACID LEVEL

## 2017-08-11 LAB — LIPASE, BLOOD: LIPASE: 19 U/L (ref 11–51)

## 2017-08-11 LAB — MAGNESIUM: Magnesium: 1.6 mg/dL — ABNORMAL LOW (ref 1.7–2.4)

## 2017-08-11 LAB — I-STAT CG4 LACTIC ACID, ED: Lactic Acid, Venous: 1.92 mmol/L — ABNORMAL HIGH (ref 0.5–1.9)

## 2017-08-11 MED ORDER — RISPERIDONE 1 MG PO TABS
1.0000 mg | ORAL_TABLET | Freq: Every day | ORAL | Status: DC
  Administered 2017-08-11 – 2017-08-15 (×5): 1 mg via ORAL
  Filled 2017-08-11: qty 4
  Filled 2017-08-11: qty 1
  Filled 2017-08-11 (×3): qty 4

## 2017-08-11 MED ORDER — DEXAMETHASONE 4 MG PO TABS
4.0000 mg | ORAL_TABLET | Freq: Two times a day (BID) | ORAL | Status: DC
  Administered 2017-08-11 – 2017-08-15 (×9): 4 mg via ORAL
  Filled 2017-08-11 (×9): qty 1

## 2017-08-11 MED ORDER — DIVALPROEX SODIUM ER 500 MG PO TB24
500.0000 mg | ORAL_TABLET | Freq: Every day | ORAL | Status: DC
  Administered 2017-08-11 – 2017-08-14 (×4): 500 mg via ORAL
  Filled 2017-08-11 (×4): qty 1

## 2017-08-11 MED ORDER — MAGNESIUM SULFATE 2 GM/50ML IV SOLN
2.0000 g | Freq: Once | INTRAVENOUS | Status: DC
  Filled 2017-08-11: qty 50

## 2017-08-11 MED ORDER — RIVAROXABAN 20 MG PO TABS
20.0000 mg | ORAL_TABLET | Freq: Every day | ORAL | Status: DC
  Administered 2017-08-11 – 2017-08-15 (×5): 20 mg via ORAL
  Filled 2017-08-11 (×5): qty 1

## 2017-08-11 MED ORDER — SODIUM CHLORIDE 0.9 % IV SOLN
INTRAVENOUS | Status: DC
  Administered 2017-08-11 – 2017-08-12 (×3): via INTRAVENOUS

## 2017-08-11 MED ORDER — SODIUM CHLORIDE 0.9 % IV BOLUS
500.0000 mL | Freq: Once | INTRAVENOUS | Status: AC
  Administered 2017-08-11: 500 mL via INTRAVENOUS

## 2017-08-11 MED ORDER — ONDANSETRON HCL 4 MG/2ML IJ SOLN
4.0000 mg | Freq: Four times a day (QID) | INTRAMUSCULAR | Status: DC | PRN
Start: 2017-08-11 — End: 2017-08-15
  Administered 2017-08-11: 4 mg via INTRAVENOUS
  Filled 2017-08-11: qty 2

## 2017-08-11 MED ORDER — SODIUM CHLORIDE 0.9 % IV SOLN
1.0000 g | INTRAVENOUS | Status: DC
  Administered 2017-08-11 – 2017-08-12 (×2): 1 g via INTRAVENOUS
  Filled 2017-08-11: qty 1
  Filled 2017-08-11: qty 10

## 2017-08-11 MED ORDER — POTASSIUM CHLORIDE CRYS ER 20 MEQ PO TBCR
40.0000 meq | EXTENDED_RELEASE_TABLET | Freq: Once | ORAL | Status: AC
  Administered 2017-08-11: 40 meq via ORAL
  Filled 2017-08-11: qty 2

## 2017-08-11 NOTE — ED Provider Notes (Signed)
[facility] DEPT Provider Note   CSN: [id number] Arrival date & time: 08/11/17  1053     History   Chief Complaint Chief Complaint  Patient presents with  . Weakness    HPI Emily Duarte is a 67 y.o. female.  67 year old female presents with diffuse whole body weakness times 3 days with associated fall.  States that she was walking her legs gave out on her.  Denies any head injury.  Has had some anorexia.  Denies any nausea vomiting or diarrhea.  No fever or chills.  No cough or congestion.  Denies any urinary symptoms.  No abdominal or chest discomfort.  No recent medications.  Does have a history of uterine cancer and is currently getting chemotherapy and last treatment was a week ago.  EMS called and blood pressure was 82/60.  Was given 1 L of IV fluid and transported here.     Past Medical History:  Diagnosis Date  . Endometrial cancer ([place])   . Hemorrhoid   . Hypertension   . Paranoid schizophrenia ([place])   . Snake bite     Patient Active Problem List   Diagnosis Date Noted  . Hypomagnesemia 06/23/2017  . Acute prerenal failure ([place]) 06/23/2017  . Left femoral vein DVT ([place]) 06/20/2017  . Lung nodule < 6cm on CT 06/06/2017  . Hypokalemia 06/06/2017  . Nausea with vomiting 06/06/2017  . Delusions ([place])   . Goals of care, counseling/discussion 03/12/2017  . Cancer associated pain 03/12/2017  . Protein-calorie malnutrition, moderate ([place]) 03/03/2017  . Schizoaffective disorder, depressive type ([place]) 02/05/2017  . 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 Elevated liver enzymes 09/06/2016  . Anemia, chronic disease 09/06/2016  . 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 Bipolar 1 disorder, mixed, severe ([place]) 08/07/2012  . Cyst of soft tissue 08/07/2012    Past Surgical History:  Procedure Laterality Date  . ENDOMETRIAL BIOPSY  08/11/2016  . IR FLUORO GUIDE PORT INSERTION RIGHT  09/05/2016    . IR US GUIDE VASC ACCESS RIGHT  09/05/2016  . TUBAL LIGATION       OB History    Gravida  1   Para  1   Term      Preterm      AB      Living  1     SAB      TAB      Ectopic      Multiple      Live Births  1            Home Medications    Prior to Admission medications   Medication Sig Start Date End Date Taking? Authorizing Provider  dexamethasone (DECADRON) 4 MG tablet Take 1 tablet (4 mg total) by mouth 2 (two) times daily with a meal. 07/11/17   Heath Lark, MD  divalproex (DEPAKOTE ER) 500 MG 24 hr tablet Take 500 mg by mouth at bedtime.    [provider]  HYDROcodone-acetaminophen (NORCO) 10-325 MG tablet Take 1 tablet by mouth every 4 (four) hours as needed. 07/29/17   Heath Lark, MD  magnesium oxide (MAG-OX) 400 (241.3 Mg) MG tablet Take 1 tablet (400 mg total) by mouth 2 (two) times daily. 06/23/17   Heath Lark, MD  ondansetron (ZOFRAN) 8 MG tablet Take 1 tablet (8 mg total) by mouth every 8 (eight) hours as needed for nausea. 04/10/17   Heath Lark, MD  potassium chloride SA (K-DUR,KLOR-CON) 20 MEQ tablet Take  1 tablet (20 mEq total) by mouth 2 (two) times daily. 06/06/17   Heath Lark, MD  prochlorperazine (COMPAZINE) 10 MG tablet Take 1 tablet (10 mg total) by mouth every 6 (six) hours as needed for nausea or vomiting. 04/10/17   Heath Lark, MD  Rivaroxaban (XARELTO) 15 MG TABS tablet Take 15 mg by mouth daily.    [provider]  XARELTO STARTER PACK 15 & 20 MG TBPK START WITH 1 TABLET 15 MG BY MOUTH TWICE DAILY WITH FOOD THEN ON 22ND DAY SWITCH TO 20 MG BY MOUTH EVERY DAY WITH FOOD 08/11/17   Heath Lark, MD    Family History Family History  Problem Relation Age of Onset  . Colon cancer Mother        late 79s  . Schizophrenia Sister   . Colon cancer Sister        late 36 s    Social History Social History   Tobacco Use  . Smoking status: Never Smoker  . Smokeless tobacco: Never Used  Substance Use Topics  . Alcohol use:  No  . Drug use: No     Allergies   Patient has no known allergies.   Review of Systems Review of Systems  All other systems reviewed and are negative.    Physical Exam Updated Vital Signs BP (!) 146/80 (BP Location: Left Arm)   Pulse (!) 106   Temp 97.6 F (36.4 C)   Resp 18   SpO2 98%   Physical Exam  Constitutional: She is oriented to person, place, and time. She appears cachectic.  Non-toxic appearance. No distress.  HENT:  Head: Normocephalic and atraumatic.  Eyes: Pupils are equal, round, and reactive to light. Conjunctivae, EOM and lids are normal.  Neck: Normal range of motion. Neck supple. No tracheal deviation present. No thyroid mass present.  Cardiovascular: Normal rate, regular rhythm and normal heart sounds. Exam reveals no gallop.  No murmur heard. Pulmonary/Chest: Effort normal and breath sounds normal. No stridor. No respiratory distress. She has no decreased breath sounds. She has no wheezes. She has no rhonchi. She has no rales.  Abdominal: Soft. Normal appearance and bowel sounds are normal. She exhibits no distension. There is no tenderness. There is no rebound and no CVA tenderness.  Musculoskeletal: Normal range of motion. She exhibits no edema or tenderness.  Neurological: She is alert and oriented to person, place, and time. She has normal strength. No cranial nerve deficit or sensory deficit. GCS eye subscore is 4. GCS verbal subscore is 5. GCS motor subscore is 6.  Skin: Skin is warm and dry. No abrasion and no rash noted. There is pallor.  Psychiatric: Her affect is blunt. Her speech is delayed. She is withdrawn.  Nursing note and vitals reviewed.    ED Treatments / Results  Labs (all labs ordered are listed, but only abnormal results are displayed) Labs Reviewed  URINE CULTURE  CBC WITH DIFFERENTIAL/PLATELET  COMPREHENSIVE METABOLIC PANEL  LIPASE, BLOOD  URINALYSIS, ROUTINE W REFLEX MICROSCOPIC  TROPONIN I  VALPROIC ACID LEVEL  I-STAT  CG4 LACTIC ACID, ED    EKG EKG Interpretation  Date/Time:  Monday August 11 2017 11:29:33 EDT Ventricular Rate:  106 PR Interval:    QRS Duration: 81 QT Interval:  317 QTC Calculation: 421 R Axis:   7 Text Interpretation:  Sinus tachycardia Borderline repolarization abnormality diffuse st depression is new when compared to prior Confirmed by Lacretia Leigh ([id number]) on 08/11/2017 11:49:20 AM   Radiology No  results found.  Procedures Procedures (including critical care time)  Medications Ordered in ED Medications  sodium chloride 0.9 % bolus 500 mL (has no administration in time range)  0.9 %  sodium chloride infusion (has no administration in time range)     Initial Impression / Assessment and Plan / ED Course  I have reviewed the triage vital signs and the nursing notes.  Pertinent labs & imaging results that were available during my care of the patient were reviewed by me and considered in my medical decision making (see chart for details).    Patient here complaining of diffuse whole body weakness.  Does have elevated troponin with evidence of new diffuse ST depression.  He denies any active chest pain at this time.  Urinalysis shows infection.  Will admit patient to the hospital   Final Clinical Impressions(s) / ED Diagnoses   Final diagnoses:  None    ED Discharge Orders    None       Lacretia Leigh, MD 08/11/17 1407

## 2017-08-11 NOTE — ED Notes (Signed)
Bed: WA07 Expected date:  Expected time:  Means of arrival:  Comments: EMS 

## 2017-08-11 NOTE — ED Notes (Signed)
Date and time results received: 08/11/17 12:38 PM   Test: Troponin I  Critical Value: 0.05  Name of Provider Notified: Dr Zenia Resides  Orders Received? Or Actions Taken?: N/A

## 2017-08-11 NOTE — ED Triage Notes (Signed)
Per EMS, pt from home complains of generalized weakness x 3 days. Pt fell 3 days ago d/t weakness. Pt denies pain or loss of consciousness. Pt has not been able to get up on her own. Pt denies n/v/d. Pt has hx of uterine cancer. Pt last had chemo 2 weeks ago. Pt given 1 liter NS en route.   BP 82/60 HR 120 CBG 160 EMS EKG unremarkable

## 2017-08-11 NOTE — ED Notes (Signed)
Patient provided with dinner tray and assisted with eating. Patient tolerated well. Patient VSS. Patient updated on POC and provided with ordered medications.

## 2017-08-11 NOTE — Progress Notes (Signed)
ANTICOAGULATION CONSULT NOTE - Initial Consult  Pharmacy Consult for rivaroxaban Indication: history of DVT  No Known Allergies  Patient Measurements:    Vital Signs: Temp: 97.6 F (36.4 C) (04/22 1102) BP: 109/71 (04/22 1600) Pulse Rate: 103 (04/22 1600)  Labs: Recent Labs    08/11/17 1118  HGB 9.1*  HCT 28.4*  PLT 153  CREATININE 1.60*  TROPONINI 0.05*    Estimated Creatinine Clearance: 29.1 mL/min (A) (by C-G formula based on SCr of 1.6 mg/dL (H)).  Medical History: Past Medical History:  Diagnosis Date  . Endometrial cancer ([place])   . Hemorrhoid   . Hypertension   . Paranoid schizophrenia ([place])   . Snake bite    Assessment: Pharmacy consulted to dose and monitor rivaroxaban in this 67 year old female who was taking it PTA for history of DVT in March 2019. Last dose 08/07/17.  Patients baseline SCr = 1.2, follow closely.  Goal of Therapy:  Monitor platelets by anticoagulation protocol: Yes   Plan:  Continue rivaroxaban 20 mg PO daily with supper and follow renal function.  Lenis Noon, PharmD, BCPS Clinical Pharmacist 08/11/2017,4:47 PM

## 2017-08-11 NOTE — ED Notes (Signed)
Emily Duarte (son)  [phone]

## 2017-08-11 NOTE — H&P (Addendum)
History and Physical  [name]:[id number] DOB: 02/20/1951 DOA: 08/11/2017  Referring physician: Dr Zenia Resides PCP: Nolene Ebbs, MD  Outpatient Specialists: Oncology Patient coming from: Home  Chief Complaint: Generalized weakness  HPI: Emily Duarte is a 67 y.o. female with medical history significant for uterine cancer status post chemotherapy, DVT, hypertension, peripheral neuropathy secondary to chemotherapy, bipolar disorder, anemia of chronic disease presented to [facility] ED with complaints of gradually worsening generalized weakness.  Onset 2 weeks ago after her last chemotherapy.  Reports poor oral intake.  Denies any fevers, chills, or other constitutional symptoms.  ED Course: On presentation to the ED the patient is afebrile but tachycardic.  Urinalysis positive for UTI.  Lab studies remarkable for leukopenia and anemia.  Review of Systems: Review of systems as stated in the HPI.  All other systems reviewed and are negative.   Past Medical History:  Diagnosis Date  . Endometrial cancer ([place])   . Hemorrhoid   . Hypertension   . Paranoid schizophrenia ([place])   . Snake bite    Past Surgical History:  Procedure Laterality Date  . ENDOMETRIAL BIOPSY  08/11/2016  . IR FLUORO GUIDE PORT INSERTION RIGHT  09/05/2016  . IR US GUIDE VASC ACCESS RIGHT  09/05/2016  . TUBAL LIGATION      Social History:  reports that she has never smoked. She has never used smokeless tobacco. She reports that she does not drink alcohol or use drugs.   No Known Allergies  Family History  Problem Relation Age of Onset  . Colon cancer Mother        late 1s  . Schizophrenia Sister   . Colon cancer Sister        late 88 s     Prior to Admission medications   Medication Sig Start Date End Date Taking? Authorizing Provider  dexamethasone (DECADRON) 4 MG tablet Take 1 tablet (4 mg total) by mouth 2 (two) times daily with a meal. 07/11/17  Yes Gorsuch, Ni, MD  divalproex (DEPAKOTE  ER) 500 MG 24 hr tablet Take 500 mg by mouth at bedtime.   Yes [provider]  HYDROcodone-acetaminophen (NORCO) 10-325 MG tablet Take 1 tablet by mouth every 4 (four) hours as needed. 07/29/17  Yes Gorsuch, Ni, MD  magnesium oxide (MAG-OX) 400 (241.3 Mg) MG tablet Take 1 tablet (400 mg total) by mouth 2 (two) times daily. 06/23/17  Yes Gorsuch, Ni, MD  ondansetron (ZOFRAN) 8 MG tablet Take 1 tablet (8 mg total) by mouth every 8 (eight) hours as needed for nausea. 04/10/17  Yes Gorsuch, Ni, MD  potassium chloride SA (K-DUR,KLOR-CON) 20 MEQ tablet Take 1 tablet (20 mEq total) by mouth 2 (two) times daily. 06/06/17  Yes Heath Lark, MD  prochlorperazine (COMPAZINE) 10 MG tablet Take 1 tablet (10 mg total) by mouth every 6 (six) hours as needed for nausea or vomiting. 04/10/17  Yes Gorsuch, Ni, MD  risperiDONE (RISPERDAL) 1 MG tablet Take 1 mg by mouth daily. 05/13/17  Yes [provider]  XARELTO STARTER PACK 15 & 20 MG TBPK START WITH 1 TABLET 15 MG BY MOUTH TWICE DAILY WITH FOOD THEN ON 22ND DAY SWITCH TO 20 MG BY MOUTH EVERY DAY WITH FOOD 08/11/17  Yes Heath Lark, MD    Physical Exam: BP 109/71   Pulse (!) 103   Temp 97.6 F (36.4 C)   Resp (!) 23   SpO2 97%   General: 67 year old African-American female frail in no acute distress.  Alert and oriented x3. Eyes: Anicteric sclera. ENT: Membranes dry with no erythema or exudates. Neck: No JVD or thyromegaly. Cardiovascular: Regular rate and rhythm with no rubs or gallops. Respiratory: Clear to auscultation with no wheezes or rales. Abdomen: Soft nontender nondistended with normal bowel sounds x4 quadrants. Skin: No ulcerative lesions or rashes noted. Musculoskeletal: Trace lower extremity edema. Psychiatric: Mood is appropriate for condition and setting. Neurologic: Alert and oriented x3.          Labs on Admission:  Basic Metabolic Panel: Recent Labs  Lab 08/11/17 1118  NA 140  K 3.4*  CL 101  CO2 26  GLUCOSE 106*   BUN 32*  CREATININE 1.60*  CALCIUM 8.2*   Liver Function Tests: Recent Labs  Lab 08/11/17 1118  AST 20  ALT 14  ALKPHOS 122  BILITOT 0.9  PROT 6.1*  ALBUMIN 2.0*   Recent Labs  Lab 08/11/17 1118  LIPASE 19   No results for input(s): AMMONIA in the last 168 hours. CBC: Recent Labs  Lab 08/11/17 1118  WBC 2.9*  NEUTROABS 0.9*  HGB 9.1*  HCT 28.4*  MCV 88.8  PLT 153   Cardiac Enzymes: Recent Labs  Lab 08/11/17 1118  TROPONINI 0.05*    BNP (last 3 results) No results for input(s): BNP in the last 8760 hours.  ProBNP (last 3 results) No results for input(s): PROBNP in the last 8760 hours.  CBG: No results for input(s): GLUCAP in the last 168 hours.  Radiological Exams on Admission: Dg Chest Port 1 View  Result Date: 08/11/2017 CLINICAL DATA:  Generalized weakness, shortness of breath. EXAM: PORTABLE CHEST 1 VIEW COMPARISON:  Radiograph of May 05, 2017. FINDINGS: The heart size and mediastinal contours are within normal limits. No pneumothorax is noted. Left lung is clear. Right internal jugular Port-A-Cath is unchanged in position. Mild right basilar atelectasis is noted with associated pleural effusion. The visualized skeletal structures are unremarkable. IMPRESSION: Mild right basilar subsegmental atelectasis is noted with associated pleural effusion. Electronically Signed   By: Marijo Conception, M.D.   On: 08/11/2017 14:00    EKG: Independently reviewed.  EKG personally reviewed reveals sinus tachycardia rate of 106.  Assessment/Plan Present on Admission: . UTI (urinary tract infection)  Active Problems:   UTI (urinary tract infection)  UTI, poa Urine culture pending Started IV Rocephin De-escalate antibiotics once culture results come back Monitor fever curve  AKI Suspect prerenal from dehydration Baseline creatinine 1.2 Creatinine on presentation 1.60 Avoid nephrotoxic agents/hypotension/dehydration Monitor urine output Repeat BMP in the  morning  Dehydration IV fluid hydration 75 cc/h Encourage oral fluid intake  Hypokalemia Repleted with p.o. potassium chloride and IV magnesium 2 g Repeat BMP and magnesium levels in the morning  Uterine cancer Self-reported last chemotherapy was 2 weeks ago Follow-up with oncology outpatient  Leukopenia Continue close monitoring Monitor fever curve  Chronic depression/anxiety/bipolar/schizophrenia Resume psychiatric meds  Physical debility/severe protein calorie malnutrition Encourage increase oral intake Start oral supplementation PT to evaluate and treat Social worker consulted for possible bed placement Lives alone Fall precautions  LLE DVT Xarelto per pharmacy. Highly appreciated.    Risks: Moderate to severe due to multiple commorbidities and advanced age.  DVT prophylaxis: Xarelto  Code Status: Full code  Family Communication: None at bedside  Disposition Plan: Admit to MedSurg  Consults called: None  Admission status: Inpatient    Kayleen Memos MD [facility] Pager [phone]  If 7PM-7AM, please contact night-coverage [email or web address] Password [facility]  08/11/2017, 4:13  PM  

## 2017-08-12 DIAGNOSIS — N179 Acute kidney failure, unspecified: Principal | ICD-10-CM

## 2017-08-12 DIAGNOSIS — D6481 Anemia due to antineoplastic chemotherapy: Secondary | ICD-10-CM

## 2017-08-12 DIAGNOSIS — C55 Malignant neoplasm of uterus, part unspecified: Secondary | ICD-10-CM

## 2017-08-12 DIAGNOSIS — T451X5A Adverse effect of antineoplastic and immunosuppressive drugs, initial encounter: Secondary | ICD-10-CM

## 2017-08-12 LAB — BASIC METABOLIC PANEL
ANION GAP: 10 (ref 5–15)
BUN: 29 mg/dL — ABNORMAL HIGH (ref 6–20)
CALCIUM: 8 mg/dL — AB (ref 8.9–10.3)
CO2: 23 mmol/L (ref 22–32)
Chloride: 107 mmol/L (ref 101–111)
Creatinine, Ser: 1.51 mg/dL — ABNORMAL HIGH (ref 0.44–1.00)
GFR calc non Af Amer: 34 mL/min — ABNORMAL LOW (ref >60)
GFR, EST AFRICAN AMERICAN: 40 mL/min — AB (ref >60)
GLUCOSE: 114 mg/dL — AB (ref 65–99)
POTASSIUM: 4.4 mmol/L (ref 3.5–5.1)
Sodium: 140 mmol/L (ref 135–145)

## 2017-08-12 LAB — CBC
HEMATOCRIT: 24.2 % — AB (ref 36.0–46.0)
HEMOGLOBIN: 7.9 g/dL — AB (ref 12.0–15.0)
MCH: 29.2 pg (ref 26.0–34.0)
MCHC: 32.6 g/dL (ref 30.0–36.0)
MCV: 89.3 fL (ref 78.0–100.0)
Platelets: 118 10*3/uL — ABNORMAL LOW (ref 150–400)
RBC: 2.71 MIL/uL — ABNORMAL LOW (ref 3.87–5.11)
RDW: 15.2 % (ref 11.5–15.5)
WBC: 2 10*3/uL — ABNORMAL LOW (ref 4.0–10.5)

## 2017-08-12 LAB — MAGNESIUM: Magnesium: 1.5 mg/dL — ABNORMAL LOW (ref 1.7–2.4)

## 2017-08-12 MED ORDER — ADULT MULTIVITAMIN W/MINERALS CH
1.0000 | ORAL_TABLET | Freq: Every day | ORAL | Status: DC
  Administered 2017-08-12 – 2017-08-15 (×4): 1 via ORAL
  Filled 2017-08-12 (×4): qty 1

## 2017-08-12 MED ORDER — ENSURE ENLIVE PO LIQD
237.0000 mL | Freq: Two times a day (BID) | ORAL | Status: DC
  Administered 2017-08-12 – 2017-08-15 (×6): 237 mL via ORAL

## 2017-08-12 NOTE — Progress Notes (Signed)
Initial Nutrition Assessment  DOCUMENTATION CODES:   Severe malnutrition in context of chronic illness  INTERVENTION:    Ensure Enlive po BID, each supplement provides 350 kcal and 20 grams of protein  Magic cup TID with meals, each supplement provides 290 kcal and 9 grams of protein  Provide MVI daily  Recommend checking phosphorus level  NUTRITION DIAGNOSIS:   Severe Malnutrition related to chronic illness, cancer and cancer related treatments as evidenced by percent weight loss, moderate fat depletion, moderate muscle depletion, severe muscle depletion.  GOAL:   Patient will meet greater than or equal to 90% of their needs  MONITOR:   PO intake, Supplement acceptance, Weight trends, Labs  REASON FOR ASSESSMENT:   Consult Assessment of nutrition requirement/status  ASSESSMENT:   Patient with PMH significant for uterine cancer status post chemotherapy, DVT, HTN, peripheral neuropathy secondary to chemotherapy, bipolar disorder, paranoid schizophrenia, anemia of chronic disease. Presents this admission with UTI with AKI.    Spoke with pt at bedside. Pt seemed to be confused with certain questions. States she had a recent loss in appetite due to a "4 by 4 box that made continuous noise." Tried to get more information on this subject, but pt unable to elaborate. Reports her son bought her chocolate Ensure and she enjoys them. States "we keep six in the refrigerator" but she could not answer how many she drinks each day.  RD observed pork chops and greens at bedside around 40% completed. RD to order Ensure and Magic cup to maximize calories and protein this admission. Runette saw pt 4/12 and diagnosed her with severe malnutrition.   Records indicate pt weighed 208 lb 08/19/16 and 139 lb this admission (33% wt loss in one year, significant for time frame). Nutrition-Focused physical exam completed.   Medications reviewed and include: NS @ 75 ml/hr, Mg sulfate, IV  abx Labs reviewed: Mg 1.5 (L)  NUTRITION - FOCUSED PHYSICAL EXAM:    Most Recent Value  Orbital Region  Moderate depletion  Upper Arm Region  Moderate depletion  Thoracic and Lumbar Region  Unable to assess  Buccal Region  Mild depletion  Temple Region  Severe depletion  Clavicle Bone Region  Severe depletion  Clavicle and Acromion Bone Region  Moderate depletion  Scapular Bone Region  Unable to assess  Dorsal Hand  Moderate depletion  Patellar Region  Moderate depletion  Anterior Thigh Region  Moderate depletion  Posterior Calf Region  Moderate depletion  Edema (RD Assessment)  None  Hair  Reviewed  Eyes  Reviewed  Mouth  Reviewed  Skin  Reviewed  Nails  Reviewed     Diet Order:  Diet regular Room service appropriate? Yes; Fluid consistency: Thin Fall precautions  EDUCATION NEEDS:   Education needs have been addressed  Skin:  Skin Assessment: Reviewed RN Assessment  Last BM:  PTA  Height:   Ht Readings from Last 1 Encounters:  08/01/17 5\' 4"  (1.626 m)    Weight:   Wt Readings from Last 1 Encounters:  08/11/17 139 lb 12.4 oz (63.4 kg)    Ideal Body Weight:  54.5 kg  BMI:  Body mass index is 23.99 kg/m.  Estimated Nutritional Needs:   Kcal:  1750-1950 kcal  Protein:  85-95 g  Fluid:  >1.7 L/day    Mariana Single RD, LDN Clinical Nutrition Pager # - [phone]

## 2017-08-12 NOTE — Progress Notes (Signed)
PROGRESS NOTE  Emily Duarte UMP:[id number] DOB: 1951/04/17 DOA: 08/11/2017 PCP: Nolene Ebbs, MD  HPI/Recap of past 24 hours: Emily Duarte is a 67 y.o. female with medical history significant for uterine cancer on chemotherapy, DVT, hypertension, peripheral neuropathy secondary to chemotherapy, bipolar disorder, anemia of chronic disease presented to [facility] ED with complaints of gradually worsening generalized weakness, over 2 weeks ago after her last chemotherapy. Reports poor oral intake. In the ED the patient was tachycardic. U/A positive for UTI. CBC noted for leukopenia and anemia. Pt admitted for further management.  Assessment/Plan: Active Problems:   UTI (urinary tract infection)  UTI Afebrile, leukopenic (on chemo), tachycardic UA with leuk esterase, TNTC WBC, mucus Urine culture pending Continue IV Rocephin, pending culture  AKI on CKD stage III Likely prerenal from dehydration New baseline creatinine 1.3 since 2/19, 1.6 on presentation Continue IVF Avoid nephrotoxic agents/hypotension/dehydration Strict I & O  Hypokalemia/hypomagnesemia Replace prn  Uterine cancer on chemotherapy Next chemo on 08/15/17  Oncologist, Dr Alvy Bimler (currently out of town)  Anemia of chronic disease Baseline around 10 Last transfusion in 3/19 Type and screen, transfuse if <7, iron panel pending  LLE DVT Continue Xarelto per pharmacy Monitor closely given anemia  Chronic depression/anxiety/bipolar/schizophrenia Resume psychiatric meds  Physical debility/severe protein calorie malnutrition Dietician consulted PT to evaluate and treat Fall precautions      Code Status: Full  Family Communication: None at bedside  Disposition Plan: Home once stable    Consultants:  None  Procedures:  None  Antimicrobials:  IV Rocephin  DVT prophylaxis:  Xarelto   Objective: Vitals:   08/11/17 1830 08/11/17 1900 08/11/17 2000 08/12/17 0524  BP: 116/83  123/79 135/83 117/73  Pulse:  (!) 108 (!) 117 93  Resp: (!) 24  20 20   Temp:   98.3 F (36.8 C) 97.7 F (36.5 C)  TempSrc:   Oral Oral  SpO2:  96% 100% 99%  Weight:   63.4 kg (139 lb 12.4 oz)     Intake/Output Summary (Last 24 hours) at 08/12/2017 1144 Last data filed at 08/12/2017 0525 Gross per 24 hour  Intake 720 ml  Output -  Net 720 ml   Filed Weights   08/11/17 2000  Weight: 63.4 kg (139 lb 12.4 oz)    Exam:   General: NAD  Cardiovascular: S1, S2 present  Respiratory: CTAB  Abdomen: Soft, NT, ND, BS present  Musculoskeletal: No pedal edema bilaterally  Skin: Normal  Psychiatry: Normal mood    Data Reviewed: CBC: Recent Labs  Lab 08/11/17 1118 08/12/17 0811  WBC 2.9* 2.0*  NEUTROABS 0.9*  --   HGB 9.1* 7.9*  HCT 28.4* 24.2*  MCV 88.8 89.3  PLT 153 400*   Basic Metabolic Panel: Recent Labs  Lab 08/11/17 1118 08/12/17 0811  NA 140 140  K 3.4* 4.4  CL 101 107  CO2 26 23  GLUCOSE 106* 114*  BUN 32* 29*  CREATININE 1.60* 1.51*  CALCIUM 8.2* 8.0*  MG 1.6* 1.5*   GFR: Estimated Creatinine Clearance: 30.8 mL/min (A) (by C-G formula based on SCr of 1.51 mg/dL (H)). Liver Function Tests: Recent Labs  Lab 08/11/17 1118  AST 20  ALT 14  ALKPHOS 122  BILITOT 0.9  PROT 6.1*  ALBUMIN 2.0*   Recent Labs  Lab 08/11/17 1118  LIPASE 19   No results for input(s): AMMONIA in the last 168 hours. Coagulation Profile: No results for input(s): INR, PROTIME in the last 168 hours. Cardiac Enzymes: Recent  Labs  Lab 08/11/17 1118  TROPONINI 0.05*   BNP (last 3 results) No results for input(s): PROBNP in the last 8760 hours. HbA1C: No results for input(s): HGBA1C in the last 72 hours. CBG: No results for input(s): GLUCAP in the last 168 hours. Lipid Profile: No results for input(s): CHOL, HDL, LDLCALC, TRIG, CHOLHDL, LDLDIRECT in the last 72 hours. Thyroid Function Tests: No results for input(s): TSH, T4TOTAL, FREET4, T3FREE, THYROIDAB in  the last 72 hours. Anemia Panel: No results for input(s): VITAMINB12, FOLATE, FERRITIN, TIBC, IRON, RETICCTPCT in the last 72 hours. Urine analysis:    Component Value Date/Time   COLORURINE YELLOW 08/11/2017 1324   APPEARANCEUR CLOUDY (A) 08/11/2017 1324   LABSPEC 1.015 08/11/2017 1324   PHURINE 5.0 08/11/2017 1324   GLUCOSEU NEGATIVE 08/11/2017 1324   HGBUR LARGE (A) 08/11/2017 1324   BILIRUBINUR NEGATIVE 08/11/2017 1324   KETONESUR NEGATIVE 08/11/2017 1324   PROTEINUR 100 (A) 08/11/2017 1324   UROBILINOGEN 0.2 07/12/2009 1846   NITRITE NEGATIVE 08/11/2017 1324   LEUKOCYTESUR MODERATE (A) 08/11/2017 1324   Sepsis Labs: @LABRCNTIP (procalcitonin:4,lacticidven:4)  )No results found for this or any previous visit (from the past 240 hour(s)).    Studies: Dg Chest Port 1 View  Result Date: 08/11/2017 CLINICAL DATA:  Generalized weakness, shortness of breath. EXAM: PORTABLE CHEST 1 VIEW COMPARISON:  Radiograph of May 05, 2017. FINDINGS: The heart size and mediastinal contours are within normal limits. No pneumothorax is noted. Left lung is clear. Right internal jugular Port-A-Cath is unchanged in position. Mild right basilar atelectasis is noted with associated pleural effusion. The visualized skeletal structures are unremarkable. IMPRESSION: Mild right basilar subsegmental atelectasis is noted with associated pleural effusion. Electronically Signed   By: Marijo Conception, M.D.   On: 08/11/2017 14:00    Scheduled Meds: . dexamethasone  4 mg Oral BID WC  . divalproex  500 mg Oral QHS  . risperiDONE  1 mg Oral Daily  . rivaroxaban  20 mg Oral Q supper    Continuous Infusions: . sodium chloride 75 mL/hr at 08/12/17 0644  . cefTRIAXone (ROCEPHIN)  IV Stopped (08/11/17 1849)  . magnesium sulfate 1 - 4 g bolus IVPB       LOS: 1 day     Alma Friendly, MD [facility]  If 7PM-7AM, please contact night-coverage [email or web address] Password [facility] 08/12/2017, 11:44 AM

## 2017-08-12 NOTE — Progress Notes (Signed)
PT Cancellation Note  Patient Details Name: Emily Duarte MRN: [id number] DOB: [birth year]   Cancelled Treatment:    Reason Eval/Treat Not Completed: Other (comment).  Pt reports she is too weak to get up and agreed to try therapy tomorrow.   Ramond Dial 08/12/2017, 6:49 PM   Mee Hives, PT MS Acute Rehab Dept. Number: [place] and [place]

## 2017-08-13 DIAGNOSIS — E46 Unspecified protein-calorie malnutrition: Secondary | ICD-10-CM

## 2017-08-13 LAB — BASIC METABOLIC PANEL
Anion gap: 9 (ref 5–15)
BUN: 34 mg/dL — ABNORMAL HIGH (ref 6–20)
CALCIUM: 7.9 mg/dL — AB (ref 8.9–10.3)
CHLORIDE: 106 mmol/L (ref 101–111)
CO2: 22 mmol/L (ref 22–32)
CREATININE: 1.35 mg/dL — AB (ref 0.44–1.00)
GFR calc Af Amer: 46 mL/min — ABNORMAL LOW (ref >60)
GFR calc non Af Amer: 39 mL/min — ABNORMAL LOW (ref >60)
GLUCOSE: 117 mg/dL — AB (ref 65–99)
Potassium: 4.1 mmol/L (ref 3.5–5.1)
Sodium: 137 mmol/L (ref 135–145)

## 2017-08-13 LAB — TYPE AND SCREEN
ABO/RH(D): O POS
ABO/RH(D): O POS
ANTIBODY SCREEN: NEGATIVE
Antibody Screen: NEGATIVE

## 2017-08-13 LAB — CBC WITH DIFFERENTIAL/PLATELET
BASOS PCT: 1 %
Basophils Absolute: 0 10*3/uL (ref 0.0–0.1)
EOS PCT: 1 %
Eosinophils Absolute: 0 10*3/uL (ref 0.0–0.7)
HEMATOCRIT: 23.5 % — AB (ref 36.0–46.0)
HEMOGLOBIN: 7.7 g/dL — AB (ref 12.0–15.0)
LYMPHS ABS: 1.2 10*3/uL (ref 0.7–4.0)
Lymphocytes Relative: 58 %
MCH: 29.2 pg (ref 26.0–34.0)
MCHC: 32.8 g/dL (ref 30.0–36.0)
MCV: 89 fL (ref 78.0–100.0)
MONO ABS: 0.3 10*3/uL (ref 0.1–1.0)
Monocytes Relative: 15 %
NRBC: 1 /100{WBCs} — AB
Neutro Abs: 0.5 10*3/uL — ABNORMAL LOW (ref 1.7–7.7)
Neutrophils Relative %: 25 %
Platelets: 146 10*3/uL — ABNORMAL LOW (ref 150–400)
RBC: 2.64 MIL/uL — ABNORMAL LOW (ref 3.87–5.11)
RDW: 15.1 % (ref 11.5–15.5)
WBC: 2 10*3/uL — ABNORMAL LOW (ref 4.0–10.5)

## 2017-08-13 LAB — IRON AND TIBC
Iron: 51 ug/dL (ref 28–170)
Saturation Ratios: 42 % — ABNORMAL HIGH (ref 10.4–31.8)
TIBC: 120 ug/dL — AB (ref 250–450)
UIBC: 69 ug/dL

## 2017-08-13 LAB — URINE CULTURE

## 2017-08-13 LAB — ABO/RH: ABO/RH(D): O POS

## 2017-08-13 LAB — MAGNESIUM: Magnesium: 1.5 mg/dL — ABNORMAL LOW (ref 1.7–2.4)

## 2017-08-13 LAB — FERRITIN: FERRITIN: 4157 ng/mL — AB (ref 11–307)

## 2017-08-13 MED ORDER — MAGNESIUM SULFATE 2 GM/50ML IV SOLN
2.0000 g | Freq: Once | INTRAVENOUS | Status: AC
Start: 2017-08-13 — End: 2017-08-13
  Administered 2017-08-13: 2 g via INTRAVENOUS
  Filled 2017-08-13: qty 50

## 2017-08-13 MED ORDER — CEPHALEXIN 500 MG PO CAPS
500.0000 mg | ORAL_CAPSULE | Freq: Two times a day (BID) | ORAL | Status: DC
  Administered 2017-08-13 – 2017-08-15 (×5): 500 mg via ORAL
  Filled 2017-08-13 (×5): qty 1

## 2017-08-13 NOTE — Progress Notes (Signed)
PT Cancellation Note  Patient Details Name: Emily Duarte MRN: [id number] DOB: 1950/09/12   Cancelled Treatment:    Reason Eval/Treat Not Completed: Other (comment).  Refused and PT talked with pt about her fears of sustaining another fall.  Plan to try again in the AM and nursing agreed to see if pt could have someone talk with her about agreeing to mobility.   Ramond Dial 08/13/2017, 4:23 PM   Mee Hives, PT MS Acute Rehab Dept. Number: [place] and [place]

## 2017-08-13 NOTE — Progress Notes (Signed)
PROGRESS NOTE  Emily Duarte HCW:[id number] DOB: 10/03/1950 DOA: 08/11/2017 PCP: Nolene Ebbs, MD  HPI/Recap of past 24 hours: Emily Duarte is a 67 y.o. female with medical history significant for uterine cancer on chemotherapy, DVT, hypertension, peripheral neuropathy secondary to chemotherapy, bipolar disorder, anemia of chronic disease presented to [facility] ED with complaints of gradually worsening generalized weakness, over 2 weeks ago after her last chemotherapy. Reports poor oral intake. In the ED the patient was tachycardic. U/A positive for UTI. CBC noted for leukopenia and anemia. Pt admitted for further management.  Subjective. Still feeling weak and fatigued.  No acute complaints.  No acute events overnight. Oral intake better.  No diarrhea no constipation.  Assessment/Plan: Active Problems:   UTI (urinary tract infection)   Protein-calorie malnutrition, severe  UTI Afebrile, leukopenic (on chemo), tachycardic UA with leuk esterase, TNTC WBC, mucus Urine culture multiple species Easily treated with IV Rocephin, change to oral Keflex  AKI on CKD stage III Likely prerenal from dehydration New baseline creatinine 1.3 since 2/19, 1.6 on presentation Hold IVF Avoid nephrotoxic agents/hypotension/dehydration Strict I & O  Hypokalemia/hypomagnesemia Replace prn  Uterine cancer on chemotherapy Next chemo on 08/15/17  Oncologist, Dr Alvy Bimler (currently out of town)  Anemia of chronic disease Baseline around 10 Last transfusion in 3/19 Type and screen, transfuse if <7, iron panel pending  LLE DVT Continue Xarelto per pharmacy Monitor closely given anemia  Chronic depression/anxiety/bipolar/schizophrenia Resume psychiatric meds Valproic acid level undetectable on admission, patient may not have taken his medication since last Thursday per son.  Physical debility/severe protein calorie malnutrition Dietician consulted PT to evaluate and treat Fall  precautions  Her weakness more appears to be lack of participation in strength assessment rather than actual weakness.  No focal deficit.  Code Status: Full  Family Communication: None at bedside  Disposition Plan: Home once stable    Consultants:  None  Procedures:  None  Antimicrobials:  IV Rocephin  DVT prophylaxis:  Xarelto   Objective: Vitals:   08/12/17 0524 08/12/17 1346 08/12/17 2159 08/13/17 0540  BP: 117/73 127/74 (!) 141/96 128/78  Pulse: 93 98 (!) 106 88  Resp: 20 16 17 15   Temp: 97.7 F (36.5 C) 98.2 F (36.8 C) 98.2 F (36.8 C) 98 F (36.7 C)  TempSrc: Oral Oral Oral Oral  SpO2: 99% 99% 97% 97%  Weight:        Intake/Output Summary (Last 24 hours) at 08/13/2017 1446 Last data filed at 08/12/2017 1920 Gross per 24 hour  Intake 350 ml  Output -  Net 350 ml   Filed Weights   08/11/17 2000  Weight: 63.4 kg (139 lb 12.4 oz)    Exam:   General: NAD  Cardiovascular: S1, S2 present  Respiratory: CTAB  Abdomen: Soft, NT, ND, BS present  Musculoskeletal: No pedal edema bilaterally  Skin: Normal  Psychiatry: Normal mood    Data Reviewed: CBC: Recent Labs  Lab 08/11/17 1118 08/12/17 0811 08/13/17 0416 08/13/17 1025  WBC 2.9* 2.0* QUESTIONABLE RESULTS, RECOMMEND RECOLLECT TO VERIFY 2.0*  NEUTROABS 0.9*  --  QUESTIONABLE RESULTS, RECOMMEND RECOLLECT TO VERIFY 0.5*  HGB 9.1* 7.9* QUESTIONABLE RESULTS, RECOMMEND RECOLLECT TO VERIFY 7.7*  HCT 28.4* 24.2* QUESTIONABLE RESULTS, RECOMMEND RECOLLECT TO VERIFY 23.5*  MCV 88.8 89.3 QUESTIONABLE RESULTS, RECOMMEND RECOLLECT TO VERIFY 89.0  PLT 153 118* QUESTIONABLE RESULTS, RECOMMEND RECOLLECT TO VERIFY 176*   Basic Metabolic Panel: Recent Labs  Lab 08/11/17 1118 08/12/17 0811 08/13/17 0416 08/13/17 1025  NA  140 140 QUESTIONABLE RESULTS, RECOMMEND RECOLLECT TO VERIFY 137  K 3.4* 4.4 QUESTIONABLE RESULTS, RECOMMEND RECOLLECT TO VERIFY 4.1  CL 101 107 QUESTIONABLE RESULTS, RECOMMEND  RECOLLECT TO VERIFY 106  CO2 26 23 QUESTIONABLE RESULTS, RECOMMEND RECOLLECT TO VERIFY 22  GLUCOSE 106* 114* QUESTIONABLE RESULTS, RECOMMEND RECOLLECT TO VERIFY 117*  BUN 32* 29* QUESTIONABLE RESULTS, RECOMMEND RECOLLECT TO VERIFY 34*  CREATININE 1.60* 1.51* QUESTIONABLE RESULTS, RECOMMEND RECOLLECT TO VERIFY 1.35*  CALCIUM 8.2* 8.0* QUESTIONABLE RESULTS, RECOMMEND RECOLLECT TO VERIFY 7.9*  MG 1.6* 1.5* QUESTIONABLE RESULTS, RECOMMEND RECOLLECT TO VERIFY 1.5*   GFR: Estimated Creatinine Clearance: 34.4 mL/min (A) (by C-G formula based on SCr of 1.35 mg/dL (H)). Liver Function Tests: Recent Labs  Lab 08/11/17 1118  AST 20  ALT 14  ALKPHOS 122  BILITOT 0.9  PROT 6.1*  ALBUMIN 2.0*   Recent Labs  Lab 08/11/17 1118  LIPASE 19   No results for input(s): AMMONIA in the last 168 hours. Coagulation Profile: No results for input(s): INR, PROTIME in the last 168 hours. Cardiac Enzymes: Recent Labs  Lab 08/11/17 1118  TROPONINI 0.05*   BNP (last 3 results) No results for input(s): PROBNP in the last 8760 hours. HbA1C: No results for input(s): HGBA1C in the last 72 hours. CBG: No results for input(s): GLUCAP in the last 168 hours. Lipid Profile: No results for input(s): CHOL, HDL, LDLCALC, TRIG, CHOLHDL, LDLDIRECT in the last 72 hours. Thyroid Function Tests: No results for input(s): TSH, T4TOTAL, FREET4, T3FREE, THYROIDAB in the last 72 hours. Anemia Panel: Recent Labs    08/13/17 1025  FERRITIN 4,157*  TIBC 120*  IRON 51   Urine analysis:    Component Value Date/Time   COLORURINE YELLOW 08/11/2017 1324   APPEARANCEUR CLOUDY (A) 08/11/2017 1324   LABSPEC 1.015 08/11/2017 1324   PHURINE 5.0 08/11/2017 1324   GLUCOSEU NEGATIVE 08/11/2017 1324   HGBUR LARGE (A) 08/11/2017 1324   BILIRUBINUR NEGATIVE 08/11/2017 1324   KETONESUR NEGATIVE 08/11/2017 1324   PROTEINUR 100 (A) 08/11/2017 1324   UROBILINOGEN 0.2 07/12/2009 1846   NITRITE NEGATIVE 08/11/2017 1324    LEUKOCYTESUR MODERATE (A) 08/11/2017 1324   Sepsis Labs: @LABRCNTIP (procalcitonin:4,lacticidven:4)  ) Recent Results (from the past 240 hour(s))  Urine Culture     Status: Abnormal   Collection Time: 08/11/17  1:24 PM  Result Value Ref Range Status   Specimen Description   Final    URINE, RANDOM Performed at [facility], [place] [address] [place], [place] [address]    Special Requests   Final    NONE Performed at [facility], [place] [address], [place], [place] [address]    Culture MULTIPLE SPECIES PRESENT, SUGGEST RECOLLECTION (A)  Final   Report Status 08/13/2017 FINAL  Final      Studies: No results found.  Scheduled Meds: . cephALEXin  500 mg Oral Q12H  . dexamethasone  4 mg Oral BID WC  . divalproex  500 mg Oral QHS  . feeding supplement (ENSURE ENLIVE)  237 mL Oral BID BM  . multivitamin with minerals  1 tablet Oral Daily  . risperiDONE  1 mg Oral Daily  . rivaroxaban  20 mg Oral Q supper    Continuous Infusions:    LOS: 2 days     Berle Mull, MD [facility]  If 7PM-7AM, please contact night-coverage [email or web address] Password [facility] 08/13/2017, 2:46 PM

## 2017-08-14 ENCOUNTER — Other Ambulatory Visit: Payer: Self-pay

## 2017-08-14 LAB — CBC
HCT: 27.4 % — ABNORMAL LOW (ref 36.0–46.0)
HEMOGLOBIN: 8.7 g/dL — AB (ref 12.0–15.0)
MCH: 28.1 pg (ref 26.0–34.0)
MCHC: 31.8 g/dL (ref 30.0–36.0)
MCV: 88.4 fL (ref 78.0–100.0)
PLATELETS: 256 10*3/uL (ref 150–400)
RBC: 3.1 MIL/uL — AB (ref 3.87–5.11)
RDW: 15.3 % (ref 11.5–15.5)
WBC: 4.3 10*3/uL (ref 4.0–10.5)

## 2017-08-14 LAB — BASIC METABOLIC PANEL
ANION GAP: 12 (ref 5–15)
BUN: 33 mg/dL — ABNORMAL HIGH (ref 6–20)
CALCIUM: 8.7 mg/dL — AB (ref 8.9–10.3)
CO2: 24 mmol/L (ref 22–32)
Chloride: 104 mmol/L (ref 101–111)
Creatinine, Ser: 1.47 mg/dL — ABNORMAL HIGH (ref 0.44–1.00)
GFR calc non Af Amer: 35 mL/min — ABNORMAL LOW (ref >60)
GFR, EST AFRICAN AMERICAN: 41 mL/min — AB (ref >60)
Glucose, Bld: 116 mg/dL — ABNORMAL HIGH (ref 65–99)
Potassium: 3.8 mmol/L (ref 3.5–5.1)
SODIUM: 140 mmol/L (ref 135–145)

## 2017-08-14 MED ORDER — RIVAROXABAN 20 MG PO TABS: 20.0000 mg | ORAL_TABLET | Freq: Every day | ORAL | 0 refills | Status: DC

## 2017-08-14 MED ORDER — HYDROCODONE-ACETAMINOPHEN 10-325 MG PO TABS: 1.0000 | ORAL_TABLET | ORAL | 0 refills | Status: DC | PRN

## 2017-08-14 MED ORDER — CEPHALEXIN 500 MG PO CAPS: 500.0000 mg | ORAL_CAPSULE | Freq: Two times a day (BID) | ORAL | 0 refills | Status: DC

## 2017-08-14 MED ORDER — POTASSIUM CHLORIDE CRYS ER 10 MEQ PO TBCR: 10.0000 meq | EXTENDED_RELEASE_TABLET | Freq: Every day | ORAL | 0 refills | Status: DC

## 2017-08-14 MED ORDER — ENSURE ENLIVE PO LIQD: 237.0000 mL | Freq: Two times a day (BID) | ORAL | 12 refills | Status: DC

## 2017-08-14 NOTE — NC FL2 (Addendum)
[place] MEDICAID FL2 LEVEL OF CARE SCREENING TOOL     IDENTIFICATION  Patient Name: GLENNETTE GALSTER Birthdate: 1951/03/20 Sex: female Admission Date (Current Location): 08/11/2017  [facility] and [place] Number:  Herbalist and Address:  [facility],  [place] [address] [place]      Provider Number: [id number]  Attending Physician Name and Address:  Lavina Hamman, MD  Relative Name and Phone Number:       Current Level of Care: Hospital Recommended Level of Care: [place] Prior Approval Number:    Date Approved/Denied:   PASRR Number:    Discharge Plan: SNF    Current Diagnoses: Patient Active Problem List   Diagnosis Date Noted  . Protein-calorie malnutrition, severe 08/13/2017  . UTI (urinary tract infection) 08/11/2017  . Hypomagnesemia 06/23/2017  . Acute prerenal failure ([place]) 06/23/2017  . Left femoral vein DVT ([place]) 06/20/2017  . Lung nodule < 6cm on CT 06/06/2017  . Hypokalemia 06/06/2017  . Nausea with vomiting 06/06/2017  . Delusions ([place])   . Goals of care, counseling/discussion 03/12/2017  . Cancer associated pain 03/12/2017  . Protein-calorie malnutrition, moderate ([place]) 03/03/2017  . Schizoaffective disorder, depressive type ([place]) 02/05/2017  . Peripheral neuropathy due to chemotherapy ([place]) 12/09/2016  . Elevated liver enzymes 09/06/2016  . Anemia, chronic disease 09/06/2016  . Endometrial/uterine adenocarcinoma ([place]) 08/19/2016  . Bipolar 1 disorder, mixed, severe ([place]) 08/07/2012  . Cyst of soft tissue 08/07/2012    Orientation RESPIRATION BLADDER Height & Weight     Self, Time, Situation, Place  Normal Continent Weight: 139 lb 12.4 oz (63.4 kg) Height:     BEHAVIORAL SYMPTOMS/MOOD NEUROLOGICAL BOWEL NUTRITION STATUS      Continent Diet(low sodium heart healthy diet)  AMBULATORY STATUS COMMUNICATION OF NEEDS Skin   Extensive Assist Verbally Normal                        Personal Care Assistance Level of Assistance  Bathing, Feeding, Dressing Bathing Assistance: Limited assistance Feeding assistance: Independent Dressing Assistance: Limited assistance     Functional Limitations Info  Sight, Hearing, Speech Sight Info: Adequate Hearing Info: Adequate Speech Info: Adequate    SPECIAL CARE FACTORS FREQUENCY  PT (By licensed PT), OT (By licensed OT)     PT Frequency: 5x OT Frequency: 5x            Contractures Contractures Info: Not present    Additional Factors Info  Code Status, Allergies Code Status Info: full code Allergies Info: nka           Current Medications (08/14/2017):  This is the current hospital active medication list Current Facility-Administered Medications  Medication Dose Route Frequency Provider Last Rate Last Dose  . cephALEXin (KEFLEX) capsule 500 mg  500 mg Oral Q12H Lavina Hamman, MD   500 mg at 08/14/17 1159  . dexamethasone (DECADRON) tablet 4 mg  4 mg Oral BID WC Hall, Carole N, DO   4 mg at 08/14/17 0901  . divalproex (DEPAKOTE ER) 24 hr tablet 500 mg  500 mg Oral QHS Hall, Carole N, DO   500 mg at 08/13/17 2200  . feeding supplement (ENSURE ENLIVE) (ENSURE ENLIVE) liquid 237 mL  237 mL Oral BID BM Alma Friendly, MD   237 mL at 08/13/17 1324  . multivitamin with minerals tablet 1 tablet  1 tablet Oral Daily Alma Friendly, MD   1 tablet at 08/14/17 1159  .  ondansetron (ZOFRAN) injection 4 mg  4 mg Intravenous Q6H PRN Irene Pap N, DO   4 mg at 08/11/17 1817  . risperiDONE (RISPERDAL) tablet 1 mg  1 mg Oral Daily Hall, Carole N, DO   1 mg at 08/14/17 1200  . rivaroxaban (XARELTO) tablet 20 mg  20 mg Oral Q supper Lenis Noon, [place]   20 mg at 08/13/17 1731     Discharge Medications: Please see discharge summary for a list of discharge medications.  Relevant Imaging Results:  Relevant Lab Results:   Additional Information SS# [id number]. Pt of [place], chemotherapy every 3 weeks. Next  appt 08/29/17 8:30am  Danzig Macgregor Valeta Harms, LCSW

## 2017-08-14 NOTE — Clinical Social Work Note (Addendum)
Clinical Social Work Assessment  Patient Details  Name: Emily Duarte MRN: [id number] Date of Birth: 05/04/1950  Date of referral:  08/14/17               Reason for consult:  Facility Placement                Permission sought to share information with:  Family Supports Permission granted to share information::  Yes, Verbal Permission Granted  Name::     son [place] Qua::     Relationship::     Contact Information:     Housing/Transportation Living arrangements for the past 2 months:  [place] of Information:  Patient, Adult Children Patient Interpreter Needed:  None Criminal Activity/Legal Involvement Pertinent to Current Situation/Hospitalization:  No - Comment as needed Significant Relationships:  Adult Children Lives with:  Self Do you feel safe going back to the place where you live?  Yes Need for family participation in patient care:  Yes (Comment)(pt asks that son help with decisions)  Care giving concerns:  Pt admitted from home where she resides alone and son who lives nearby checks in on her daily. Reports PTA she was completing ADLs independently, son was driving her around for appointments and errands, and she was using a rolling walker with seat to ambulate.  Pt is seen at [facility], currently in chemotherapy treatments (uterine cancer). Also has bipolar disorder which is managed on medications. Currently admitted for UTI.    Social Worker assessment / plan:  CSW consulted to assist with disposition- SNF placement for ST rehab. Met with pt at bedside- she was alert and oriented, however affect very flat. Also discussed with son via phone. Pt and son understand recommendation for SNF, state they anticipated pt needing rehab after she sustained fall at home last week and further weakened during hospital stay. Son states he has applied for medicaid for pt as well anticipating pt may need custodial care at SNF in the future if unable to progress  with therapy, however current plan is for short term rehab with [place] home once complete.  Son prefers [facility] and agreed to other referrals if [facility] not available. Pt states next chemotherapy is 08/29/17 and son confirms he can transport her, no facility transport needed.  Completed FL2 and made referrals. [facility] states unable to accept care of pts receiving chemotherapy. Provided other bed offers to son and he will research facilities tonight and give selection to CSW in the a.m.  Note- Could not submit for PASRR as site is down for maintenance, will submit pasrr when able. Pt will need approved pasrr prior to SNF admission (Hx of bipolar d/o for pasrr submission: Bipolar I disorder, managed on Riserdal and Depakote, hx of inpatient treatment as well, last admission at [facility] geriatric psychiatry unit 04/2017 for delusional thought and mood disturbance. No current related behaviors. Pt and son report medications managed by oncologist, last office visit 08/03/17)  Plan: SNF for ST rehab at DC Barriers: bed offers, PASRR  Employment status:  Disabled (Comment on whether or not currently receiving Disability) Insurance information:  Medicare PT Recommendations:  [place] / Referral to community resources:  [place]  Patient/Family's Response to care:  Son appreciative, pt does not remark  Patient/Family's Understanding of and Emotional Response to Diagnosis, Current Treatment, and Prognosis:  Pt demonstrates adequate understanding but speech is blunt, does not elaborate. Affect flat, reports emotions/mood fair.  Son expressed  good understanding, has clearly researched potential plans and is proactive in seeking care for pt as evidenced by visiting facilities, planning for her appointments, applying for Medicaid, and keeping history of her care needs.  Emotional Assessment Appearance:  Appears stated age Attitude/Demeanor/Rapport:   Lethargic Affect (typically observed):  Flat, Accepting Orientation:  Oriented to Self, Oriented to Place, Oriented to  Time, Oriented to Situation Alcohol / Substance use:  Not Applicable Psych involvement (Current and /or in the community):  Yes (Comment)(has inpatient psychiatric admission 04/2017)  Discharge Needs  Concerns to be addressed:  Discharge Planning Concerns Readmission within the last 30 days:  No Current discharge risk:  Dependent with Mobility Barriers to Discharge:  [place] ([place])   Nila Nephew, LCSW 08/14/2017, 1:30 PM [phone]

## 2017-08-14 NOTE — Care Management Important Message (Signed)
Important Message  Patient Details  Name: ARDYTHE KLUTE MRN: [id number] Date of Birth: 1950-10-28   Medicare Important Message Given:  Yes    Kerin Salen 08/14/2017, 11:57 AMImportant Message  Patient Details  Name: JULAINE ZIMNY MRN: [id number] Date of Birth: 10/01/1950   Medicare Important Message Given:  Yes    Kerin Salen 08/14/2017, 11:57 AM

## 2017-08-14 NOTE — Evaluation (Signed)
Physical Therapy Evaluation Patient Details Name: Emily Duarte MRN: [id number] DOB: Nov 25, 1950 Today's Date: 08/14/2017   History of Present Illness  Emily Duarte is a 67 y.o. female with medical history significant for uterine cancer on chemotherapy, DVT, hypertension, peripheral neuropathy secondary to chemotherapy, bipolar disorder, anemia of chronic disease admitted with weakness and found to have U/A positive for UTI. CBC noted for leukopenia and anemia.   Clinical Impression  Patient presents with decreased independence with mobility due to weakness, poor activity tolerance with HR up to 142 with OOB to chair and sit to stand.  Was unable to take steps due to LE's buckling, SOB and tachycardia.  She lives alone with daily assist from son.  Feel she will benefit from SNF level rehab at d/c. PT to follow acutely.    Follow Up Recommendations SNF;Supervision/Assistance - 24 hour    Equipment Recommendations  None recommended by PT    Recommendations for Other Services       Precautions / Restrictions Precautions Precautions: Fall Precaution Comments: watch HR      Mobility  Bed Mobility Overal bed mobility: Needs Assistance Bed Mobility: Rolling;Sidelying to Sit Rolling: Min assist Sidelying to sit: Min guard       General bed mobility comments: assist to roll for hygiene due to bloody discharge, able to lift hips and assisted to don briefs and pad, pt side to sit with assist for safety  Transfers Overall transfer level: Needs assistance Equipment used: None;Rolling walker (2 wheeled) Transfers: Set designer Transfers;Sit to/from Stand Sit to Stand: Mod assist   Squat pivot transfers: Mod assist     General transfer comment: to drop arm chair assist for safety, lifting and pt with uncontrolled descent into chair, assist to stand to walker with lifting help, cues for hand placement  Ambulation/Gait             General Gait Details: pt declined  and noted HR 142 with SOB   Stairs            Wheelchair Mobility    Modified Rankin (Stroke Patients Only)       Balance Overall balance assessment: Needs assistance Sitting-balance support: No upper extremity supported;Feet supported Sitting balance-Leahy Scale: Fair Sitting balance - Comments: declined to don socks on her own to test balance   Standing balance support: Bilateral upper extremity supported Standing balance-Leahy Scale: Poor Standing balance comment: back on her heels initially in standing and LE's weak and buckling in standing with RW with assist                             Pertinent Vitals/Pain Pain Assessment: Faces Faces Pain Scale: Hurts little more Pain Location: L foot  Pain Descriptors / Indicators: Discomfort Pain Intervention(s): Monitored during session;Repositioned    Home Living Family/patient expects to be discharged to:: Skilled nursing facility Living Arrangements: Alone Available Help at Discharge: Family;Available PRN/intermittently(son) Type of Home: House Home Access: Stairs to enter   [employer] of Steps: 1 Home Layout: One level Home Equipment: Walker - 4 wheels;Wheelchair - Education administrator (comment)(assist pole for bed mobility/transfers)      Prior Function Level of Independence: Needs assistance   Gait / Transfers Assistance Needed: states son has to help her up and to w/c and up step for home entry, but she lives alone  ADL's / Homemaking Assistance Needed: inconsistent reports, states only getting up to w/c four days prior to admission,  but that she walks to the bathroom        Hand Dominance        Extremity/Trunk Assessment   Upper Extremity Assessment Upper Extremity Assessment: RUE deficits/detail;LUE deficits/detail RUE Deficits / Details: AROM WFL, strength elbow flex 4-/5, extension 3+/5, shoulder flex 4-/5 LUE Deficits / Details: AROM WFL, strength elbow flex 4-/5, extension 3+/5,  shoulder flex 4-/5    Lower Extremity Assessment Lower Extremity Assessment: LLE deficits/detail;RLE deficits/detail RLE Deficits / Details: AROM WFL strength hip flexion 2+/5, knee extension 4-/5 LLE Deficits / Details: AROM WFL strength hip flexion 2+/5, knee extension 4-/5, ankle DF 3-/5       Communication   Communication: No difficulties  Cognition Arousal/Alertness: Awake/alert Behavior During Therapy: Anxious Overall Cognitive Status: No family/caregiver present to determine baseline cognitive functioning                                 General Comments: oriented to situation, but some decreased problem solving in regards to her mobility status and ability to go home alone, did agree with some evidence she would need rehab prior to home      General Comments General comments (skin integrity, edema, etc.): HR 142, SpO2 97%, BP 126/64 after up to chair, HR back down to 122    Exercises     Assessment/Plan    PT Assessment Patient needs continued PT services  PT Problem List Decreased strength;Decreased mobility;Decreased safety awareness;Decreased activity tolerance;Decreased balance;Decreased knowledge of use of DME;Cardiopulmonary status limiting activity       PT Treatment Interventions DME instruction;Therapeutic activities;Therapeutic exercise;Patient/family education;Gait training;Balance training;Functional mobility training;Wheelchair mobility training    PT Goals (Current goals can be found in the Care Plan section)  Acute Rehab PT Goals Patient Stated Goal: Agreeabe to rehab PT Goal Formulation: With patient Time For Goal Achievement: 08/28/17 Potential to Achieve Goals: Good    Frequency Min 3X/week   Barriers to discharge        Co-evaluation               AM-PAC PT "6 Clicks" Daily Activity  Outcome Measure Difficulty turning over in bed (including adjusting bedclothes, sheets and blankets)?: Unable Difficulty moving from  lying on back to sitting on the side of the bed? : Unable Difficulty sitting down on and standing up from a chair with arms (e.g., wheelchair, bedside commode, etc,.)?: Unable Help needed moving to and from a bed to chair (including a wheelchair)?: A Lot Help needed walking in hospital room?: A Lot Help needed climbing 3-5 steps with a railing? : Total 6 Click Score: 8    End of Session Equipment Utilized During Treatment: Gait belt Activity Tolerance: Patient limited by fatigue;Treatment limited secondary to medical complications (Comment)(tachycardia) Patient left: with call bell/phone within reach;in chair;with chair alarm set Nurse Communication: Mobility status PT Visit Diagnosis: Muscle weakness (generalized) (M62.81);Other abnormalities of gait and mobility (R26.89)    Time: 9735-3299 PT Time Calculation (min) (ACUTE ONLY): 38 min   Charges:   PT Evaluation $PT Eval Moderate Complexity: 1 Mod PT Treatments $Therapeutic Activity: 23-37 mins   PT G CodesMagda Kiel, [place] [phone] 08/14/2017   Reginia Naas 08/14/2017, 11:19 AM

## 2017-08-14 NOTE — Progress Notes (Signed)
PROGRESS NOTE  Emily Duarte WCB:[id number] DOB: 07/16/1950 DOA: 08/11/2017 PCP: Nolene Ebbs, MD  HPI/Recap of past 24 hours: Emily Duarte is a 67 y.o. female with medical history significant for uterine cancer on chemotherapy, DVT, hypertension, peripheral neuropathy secondary to chemotherapy, bipolar disorder, anemia of chronic disease presented to [facility] ED with complaints of gradually worsening generalized weakness, over 2 weeks ago after her last chemotherapy. Reports poor oral intake. In the ED the patient was tachycardic. U/A positive for UTI. CBC noted for leukopenia and anemia. Pt admitted for further management.  Subjective. Patient is to complain about weakness but no acute events overnight.  Oral intake getting better. Initial plan was to discharge the patient to SNF but unable to discharge the patient right now given need for PASSR information  Assessment/Plan: Active Problems:   UTI (urinary tract infection)   Protein-calorie malnutrition, severe  UTI Afebrile, leukopenic (on chemo), tachycardic UA with leuk esterase, TNTC WBC, mucus Urine culture multiple species Easily treated with IV Rocephin, change to oral Keflex  AKI on CKD stage III Likely prerenal from dehydration New baseline creatinine 1.3 since 2/19, 1.6 on presentation Hold IVF Avoid nephrotoxic agents/hypotension/dehydration Strict I & O  Hypokalemia/hypomagnesemia Replace prn  Uterine cancer on chemotherapy Next chemo on 08/15/17 although we will currently recommend to hold it on that she is better on performance status. Oncologist, Dr Alvy Bimler (currently out of town)  Anemia of chronic disease Baseline around 10 Last transfusion in 3/19 Type and screen, transfuse if <7, iron panel normal iron.  LLE DVT Continue Xarelto per pharmacy Monitor renal function closely given anemia  Chronic depression/anxiety/bipolar/schizophrenia Resume psychiatric meds Valproic acid level  undetectable on admission, patient may not have taken his medication since last Thursday per son. Also patient's generalized weakness  Physical debility/severe protein calorie malnutrition Dietician consulted PT to evaluate and treat Fall precautions  Her weakness more appears to be lack of participation in strength assessment rather than actual weakness.  No focal deficit.  Code Status: Full  Family Communication: None at bedside  Disposition Plan: Home once stable    Consultants:  None  Procedures:  None  Antimicrobials:  IV Rocephin  DVT prophylaxis:  Xarelto   Objective: Vitals:   08/13/17 2155 08/14/17 0613 08/14/17 1304 08/14/17 1630  BP: 122/77 128/79 125/75 116/73  Pulse: 83 77 100 93  Resp: 13 15  18   Temp: 98 F (36.7 C) 97.9 F (36.6 C) 97.8 F (36.6 C) 97.7 F (36.5 C)  TempSrc: Oral Oral Oral Oral  SpO2: 95% 96% 98% 98%  Weight:        Intake/Output Summary (Last 24 hours) at 08/14/2017 1655 Last data filed at 08/14/2017 1300 Gross per 24 hour  Intake 540 ml  Output 400 ml  Net 140 ml   Filed Weights   08/11/17 2000  Weight: 63.4 kg (139 lb 12.4 oz)    Exam:   General: NAD  Cardiovascular: S1, S2 present  Respiratory: CTAB  Abdomen: Soft, NT, ND, BS present  Musculoskeletal: No pedal edema bilaterally  Skin: Normal  Psychiatry: Normal mood    Data Reviewed: CBC: Recent Labs  Lab 08/11/17 1118 08/12/17 0811 08/13/17 0416 08/13/17 1025 08/14/17 1207  WBC 2.9* 2.0* QUESTIONABLE RESULTS, RECOMMEND RECOLLECT TO VERIFY 2.0* 4.3  NEUTROABS 0.9*  --  QUESTIONABLE RESULTS, RECOMMEND RECOLLECT TO VERIFY 0.5*  --   HGB 9.1* 7.9* QUESTIONABLE RESULTS, RECOMMEND RECOLLECT TO VERIFY 7.7* 8.7*  HCT 28.4* 24.2* QUESTIONABLE RESULTS, RECOMMEND RECOLLECT  TO VERIFY 23.5* 27.4*  MCV 88.8 89.3 QUESTIONABLE RESULTS, RECOMMEND RECOLLECT TO VERIFY 89.0 88.4  PLT 153 118* QUESTIONABLE RESULTS, RECOMMEND RECOLLECT TO VERIFY 146* 409    Basic Metabolic Panel: Recent Labs  Lab 08/11/17 1118 08/12/17 0811 08/13/17 0416 08/13/17 1025 08/14/17 1207  NA 140 140 QUESTIONABLE RESULTS, RECOMMEND RECOLLECT TO VERIFY 137 140  K 3.4* 4.4 QUESTIONABLE RESULTS, RECOMMEND RECOLLECT TO VERIFY 4.1 3.8  CL 101 107 QUESTIONABLE RESULTS, RECOMMEND RECOLLECT TO VERIFY 106 104  CO2 26 23 QUESTIONABLE RESULTS, RECOMMEND RECOLLECT TO VERIFY 22 24  GLUCOSE 106* 114* QUESTIONABLE RESULTS, RECOMMEND RECOLLECT TO VERIFY 117* 116*  BUN 32* 29* QUESTIONABLE RESULTS, RECOMMEND RECOLLECT TO VERIFY 34* 33*  CREATININE 1.60* 1.51* QUESTIONABLE RESULTS, RECOMMEND RECOLLECT TO VERIFY 1.35* 1.47*  CALCIUM 8.2* 8.0* QUESTIONABLE RESULTS, RECOMMEND RECOLLECT TO VERIFY 7.9* 8.7*  MG 1.6* 1.5* QUESTIONABLE RESULTS, RECOMMEND RECOLLECT TO VERIFY 1.5*  --    GFR: Estimated Creatinine Clearance: 31.6 mL/min (A) (by C-G formula based on SCr of 1.47 mg/dL (H)). Liver Function Tests: Recent Labs  Lab 08/11/17 1118  AST 20  ALT 14  ALKPHOS 122  BILITOT 0.9  PROT 6.1*  ALBUMIN 2.0*   Recent Labs  Lab 08/11/17 1118  LIPASE 19   No results for input(s): AMMONIA in the last 168 hours. Coagulation Profile: No results for input(s): INR, PROTIME in the last 168 hours. Cardiac Enzymes: Recent Labs  Lab 08/11/17 1118  TROPONINI 0.05*   BNP (last 3 results) No results for input(s): PROBNP in the last 8760 hours. HbA1C: No results for input(s): HGBA1C in the last 72 hours. CBG: No results for input(s): GLUCAP in the last 168 hours. Lipid Profile: No results for input(s): CHOL, HDL, LDLCALC, TRIG, CHOLHDL, LDLDIRECT in the last 72 hours. Thyroid Function Tests: No results for input(s): TSH, T4TOTAL, FREET4, T3FREE, THYROIDAB in the last 72 hours. Anemia Panel: Recent Labs    08/13/17 1025  FERRITIN 4,157*  TIBC 120*  IRON 51   Urine analysis:    Component Value Date/Time   COLORURINE YELLOW 08/11/2017 1324   APPEARANCEUR CLOUDY (A)  08/11/2017 1324   LABSPEC 1.015 08/11/2017 1324   PHURINE 5.0 08/11/2017 1324   GLUCOSEU NEGATIVE 08/11/2017 1324   HGBUR LARGE (A) 08/11/2017 1324   BILIRUBINUR NEGATIVE 08/11/2017 1324   KETONESUR NEGATIVE 08/11/2017 1324   PROTEINUR 100 (A) 08/11/2017 1324   UROBILINOGEN 0.2 07/12/2009 1846   NITRITE NEGATIVE 08/11/2017 1324   LEUKOCYTESUR MODERATE (A) 08/11/2017 1324   Sepsis Labs: @LABRCNTIP (procalcitonin:4,lacticidven:4)  ) Recent Results (from the past 240 hour(s))  Urine Culture     Status: Abnormal   Collection Time: 08/11/17  1:24 PM  Result Value Ref Range Status   Specimen Description   Final    URINE, RANDOM Performed at [facility], [place] [address], [place], [place] [id number]    Special Requests   Final    NONE Performed at [facility], [place] [address], [place], [place] [address]    Culture MULTIPLE SPECIES PRESENT, SUGGEST RECOLLECTION (A)  Final   Report Status 08/13/2017 FINAL  Final      Studies: No results found.  Scheduled Meds: . cephALEXin  500 mg Oral Q12H  . dexamethasone  4 mg Oral BID WC  . divalproex  500 mg Oral QHS  . feeding supplement (ENSURE ENLIVE)  237 mL Oral BID BM  . multivitamin with minerals  1 tablet Oral Daily  . risperiDONE  1 mg Oral  Daily  . rivaroxaban  20 mg Oral Q supper    Continuous Infusions:    LOS: 3 days     Berle Mull, MD [facility]  If 7PM-7AM, please contact night-coverage [email or web address] Password [facility] 08/14/2017, 4:55 PM

## 2017-08-15 ENCOUNTER — Inpatient Hospital Stay: Payer: Medicare Other

## 2017-08-15 ENCOUNTER — Emergency Department (HOSPITAL_COMMUNITY)
Admission: EM | Admit: 2017-08-15 | Discharge: 2017-08-16 | Disposition: A | Discharge status: Home / Self Care | Payer: Medicare Other | Source: Home / Self Care | Attending: Emergency Medicine | Admitting: Emergency Medicine

## 2017-08-15 ENCOUNTER — Other Ambulatory Visit: Payer: Self-pay

## 2017-08-15 DIAGNOSIS — R630 Anorexia: Secondary | ICD-10-CM | POA: Insufficient documentation

## 2017-08-15 DIAGNOSIS — Z8542 Personal history of malignant neoplasm of other parts of uterus: Secondary | ICD-10-CM

## 2017-08-15 DIAGNOSIS — Z9221 Personal history of antineoplastic chemotherapy: Secondary | ICD-10-CM

## 2017-08-15 DIAGNOSIS — F251 Schizoaffective disorder, depressive type: Secondary | ICD-10-CM

## 2017-08-15 DIAGNOSIS — F329 Major depressive disorder, single episode, unspecified: Secondary | ICD-10-CM

## 2017-08-15 DIAGNOSIS — Z79899 Other long term (current) drug therapy: Secondary | ICD-10-CM | POA: Insufficient documentation

## 2017-08-15 DIAGNOSIS — I10 Essential (primary) hypertension: Secondary | ICD-10-CM | POA: Insufficient documentation

## 2017-08-15 DIAGNOSIS — F32A Depression, unspecified: Secondary | ICD-10-CM

## 2017-08-15 DIAGNOSIS — E860 Dehydration: Secondary | ICD-10-CM | POA: Diagnosis not present

## 2017-08-15 DIAGNOSIS — Z7901 Long term (current) use of anticoagulants: Secondary | ICD-10-CM | POA: Insufficient documentation

## 2017-08-15 LAB — COMPREHENSIVE METABOLIC PANEL
ALT: 22 U/L (ref 14–54)
ANION GAP: 15 (ref 5–15)
AST: 37 U/L (ref 15–41)
Albumin: 2 g/dL — ABNORMAL LOW (ref 3.5–5.0)
Alkaline Phosphatase: 127 U/L — ABNORMAL HIGH (ref 38–126)
BUN: 34 mg/dL — ABNORMAL HIGH (ref 6–20)
CO2: 23 mmol/L (ref 22–32)
Calcium: 8.4 mg/dL — ABNORMAL LOW (ref 8.9–10.3)
Chloride: 97 mmol/L — ABNORMAL LOW (ref 101–111)
Creatinine, Ser: 1.52 mg/dL — ABNORMAL HIGH (ref 0.44–1.00)
GFR, EST AFRICAN AMERICAN: 40 mL/min — AB (ref >60)
GFR, EST NON AFRICAN AMERICAN: 34 mL/min — AB (ref >60)
Glucose, Bld: 120 mg/dL — ABNORMAL HIGH (ref 65–99)
Potassium: 4.4 mmol/L (ref 3.5–5.1)
SODIUM: 135 mmol/L (ref 135–145)
TOTAL PROTEIN: 5.8 g/dL — AB (ref 6.5–8.1)
Total Bilirubin: 0.1 mg/dL — ABNORMAL LOW (ref 0.3–1.2)

## 2017-08-15 LAB — CBC
HCT: 24.1 % — ABNORMAL LOW (ref 36.0–46.0)
HCT: 27 % — ABNORMAL LOW (ref 36.0–46.0)
HEMOGLOBIN: 7.8 g/dL — AB (ref 12.0–15.0)
HEMOGLOBIN: 8.7 g/dL — AB (ref 12.0–15.0)
MCH: 28.5 pg (ref 26.0–34.0)
MCH: 28.2 pg (ref 26.0–34.0)
MCHC: 32.4 g/dL (ref 30.0–36.0)
MCHC: 32.2 g/dL (ref 30.0–36.0)
MCV: 88 fL (ref 78.0–100.0)
MCV: 87.7 fL (ref 78.0–100.0)
PLATELETS: 320 10*3/uL (ref 150–400)
Platelets: 218 10*3/uL (ref 150–400)
RBC: 2.74 MIL/uL — ABNORMAL LOW (ref 3.87–5.11)
RBC: 3.08 MIL/uL — ABNORMAL LOW (ref 3.87–5.11)
RDW: 15.4 % (ref 11.5–15.5)
RDW: 15.6 % — AB (ref 11.5–15.5)
WBC: 5.4 10*3/uL (ref 4.0–10.5)
WBC: 8.4 10*3/uL (ref 4.0–10.5)

## 2017-08-15 LAB — BASIC METABOLIC PANEL
ANION GAP: 11 (ref 5–15)
BUN: 32 mg/dL — ABNORMAL HIGH (ref 6–20)
CALCIUM: 8 mg/dL — AB (ref 8.9–10.3)
CO2: 24 mmol/L (ref 22–32)
CREATININE: 1.4 mg/dL — AB (ref 0.44–1.00)
Chloride: 103 mmol/L (ref 101–111)
GFR calc Af Amer: 44 mL/min — ABNORMAL LOW (ref >60)
GFR calc non Af Amer: 38 mL/min — ABNORMAL LOW (ref >60)
GLUCOSE: 94 mg/dL (ref 65–99)
Potassium: 3.4 mmol/L — ABNORMAL LOW (ref 3.5–5.1)
Sodium: 138 mmol/L (ref 135–145)

## 2017-08-15 LAB — SALICYLATE LEVEL

## 2017-08-15 LAB — ETHANOL

## 2017-08-15 LAB — ACETAMINOPHEN LEVEL

## 2017-08-15 MED ORDER — HEPARIN SOD (PORK) LOCK FLUSH 100 UNIT/ML IV SOLN
500.0000 [IU] | Freq: Once | INTRAVENOUS | Status: AC
  Administered 2017-08-15: 500 [IU] via INTRAVENOUS
  Filled 2017-08-15: qty 5

## 2017-08-15 NOTE — ED Triage Notes (Signed)
Pt is alert and oriented x 4  and is verbally responsive. PT son states that he wants his mother to be mentally evaluate as she has told her son she does not want to undergo chemo any longer, and son wants to know that pt is sound and mind, and reports that during her last admission he ased for her to be mentally evaluated and states that it was not done. Per the pt she states that she is unable to care for herself and too weak and that she wants to stay in the hospital because she can not care for herself. Pt states that she needs chemo but needs a break and wants to regain her strength first. Pt dnies SI/HI

## 2017-08-15 NOTE — Progress Notes (Addendum)
CSW received call from patient son. after visiting SNF options, son states at this time he prefers to take the patient home.  CSW informed Case Manager son wants more information about Home Health.   CSW signing off.   Kathrin Greathouse, Latanya Presser, MSW Clinical Social Worker  [phone] 08/15/2017  1:14 PM

## 2017-08-15 NOTE — Progress Notes (Addendum)
Patient daughter in law called CSW at 4:28pm and stated "they made a mistake with home health services."  The patient family do not think they will be able to provide 24 hour care for the patient,they are agreeable to SNF placement now and plan to place her from home.   CSW received call from [facility], willing to take the patient today. Patient PASRR is still under manuel review due to psyche history and recent admission at [facility] geriatric Psychiatry unit 04/2017.   The SNF facility liaison will follow up with CSW on Monday to retreive the PASRR and attempt to place the patient .    Patient will d/c home today.   Kathrin Greathouse, Latanya Presser, MSW Clinical Social Worker  [phone] 08/15/2017  5:05 PM

## 2017-08-15 NOTE — Progress Notes (Signed)
Per conversation with CSW plan is to now take pt home with [facility] services instead of SNF. This CM spoke with pt daughter in law to offer choice and Emily Duarte was chosen. [facility] rep alerted of referral. Marney Doctor RN,BSN,NCM [phone]

## 2017-08-15 NOTE — Discharge Summary (Addendum)
[facility] Discharge Summary   Patient: Emily Duarte EYC:[id number]   PCP: Nolene Ebbs, MD DOB: May 02, 1950   Date of admission: 08/11/2017   Date of discharge:  08/15/2017    Discharge Diagnoses:  Active Problems:   UTI (urinary tract infection)   Protein-calorie malnutrition, severe   Admitted From: home Disposition:  SNF  Recommendations for Outpatient Follow-up:  1. Please follow-up with PCP in 1 week.  Follow-up Information    Nolene Ebbs, MD. Schedule an appointment as soon as possible for a visit in 1 week(s).   Specialty:  Internal Medicine Contact information: [place]  [address] [phone]        Care, [facility] Follow up.   Specialty:  Home Health Services Contact information: [place] [address] [phone]        Heath Lark, MD. Schedule an appointment as soon as possible for a visit in 1 week(s).   Specialty:  Hematology and Oncology Contact information: [place] [address] [phone]          Diet recommendation: Cardiac diet  Activity: The patient is advised to gradually reintroduce usual activities.  Discharge Condition: good  Code Status: Full code  History of present illness: As per the H and P dictated on admission, "Emily Duarte is a 67 y.o. female with medical history significant for uterine cancer status post chemotherapy, DVT, hypertension, peripheral neuropathy secondary to chemotherapy, bipolar disorder, anemia of chronic disease presented to [facility] ED with complaints of gradually worsening generalized weakness.  Onset 2 weeks ago after her last chemotherapy.  Reports poor oral intake.  Denies any fevers, chills, or other constitutional symptoms."  Hospital Course:  Summary of her active problems in the hospital is as following. UTI Afebrile, leukopenic (on chemo), tachycardic UA with leuk esterase, TNTC WBC,  mucus Urine culture multiple species Initially treated with IVRocephin, change to oral Keflex, last day of antibiotic 08/15/2017.  AKI on CKD stage III Likely prerenal from dehydration New baseline creatinine 1.3 since 2/19, 1.6 on presentation Renal function stable.  Encourage p.o. fluid.  Hypokalemia/hypomagnesemia Replaced.  Now stable  Uterine cancer on chemotherapy Next chemo on 08/15/17 although we will currently recommend to hold it until she is better on performance status. Oncologist, Dr Alvy Bimler (currently out of town) patient will follow up with them in the next week.  Anemia of chronic disease, worsening due to chemotherapy. Baseline around 10 Last transfusion in 3/19 H&H trending between 7-8 no active bleeding reported.  Outpatient follow-up with hematology recommended.  LLE DVT Continue Xarelto per pharmacy  Chronic depression/anxiety/bipolar/schizophrenia Resume psychiatric meds Valproic acid level undetectable on admission, patient may not have taken his medication since last Thursday per son. Also patient's generalized weakness  Physical debility/severeprotein calorie malnutrition Dietician consulted PT to evaluate and treat Fall precautions  Her weakness more appears to be lack of participation in strength assessment rather than actual weakness.  No focal deficit.  All other chronic medical condition were stable during the hospitalization.  Patient was seen by physical therapy, who recommended SNF, Family had multiple back-and-forth regarding SNF versus home discharge. Currently family wants the patient to be discharged to SNF which was arranged by Education officer, museum and case Freight forwarder. On the day of the discharge the patient's Java, and no other acute medical condition were reported by patient. the patient was felt safe to be discharge at SNF with therapy.  Procedures and Results:  none  Consultations:  none  DISCHARGE  MEDICATION: Allergies as of 08/15/2017   No Known Allergies     Medication List    STOP taking these medications   XARELTO STARTER PACK 15 & 20 MG Tbpk Generic drug:  Rivaroxaban Replaced by:  rivaroxaban 20 MG Tabs tablet     TAKE these medications   cephALEXin 500 MG capsule Commonly known as:  KEFLEX Take 1 capsule (500 mg total) by mouth 2 (two) times daily for 1 day.   dexamethasone 4 MG tablet Commonly known as:  DECADRON Take 1 tablet (4 mg total) by mouth 2 (two) times daily with a meal.   divalproex 500 MG 24 hr tablet Commonly known as:  DEPAKOTE ER Take 500 mg by mouth at bedtime.   feeding supplement (ENSURE ENLIVE) Liqd Take 237 mLs by mouth 2 (two) times daily between meals.   HYDROcodone-acetaminophen 10-325 MG tablet Commonly known as:  NORCO Take 1 tablet by mouth every 4 (four) hours as needed.   magnesium oxide 400 (241.3 Mg) MG tablet Commonly known as:  MAG-OX Take 1 tablet (400 mg total) by mouth 2 (two) times daily.   ondansetron 8 MG tablet Commonly known as:  ZOFRAN Take 1 tablet (8 mg total) by mouth every 8 (eight) hours as needed for nausea.   potassium chloride 10 MEQ tablet Commonly known as:  K-DUR,KLOR-CON Take 1 tablet (10 mEq total) by mouth daily. What changed:    medication strength  how much to take  when to take this   prochlorperazine 10 MG tablet Commonly known as:  COMPAZINE Take 1 tablet (10 mg total) by mouth every 6 (six) hours as needed for nausea or vomiting.   risperiDONE 1 MG tablet Commonly known as:  RISPERDAL Take 1 mg by mouth daily.   rivaroxaban 20 MG Tabs tablet Commonly known as:  XARELTO Take 1 tablet (20 mg total) by mouth daily with supper. Replaces:  XARELTO STARTER PACK 15 & 20 MG Tbpk      No Known Allergies Discharge Instructions    Diet - low sodium heart healthy   Complete by:  As directed    Discharge instructions   Complete by:  As directed    It is important that you read  following instructions as well as go over your medication list with RN to help you understand your care after this hospitalization.  Discharge Instructions: Please follow-up with PCP in one week  Please request your primary care physician to go over all Hospital Tests and Procedure/Radiological results at the follow up,  Please get all Hospital records sent to your PCP by signing hospital release before you go home.   Do not take more than prescribed Pain, Sleep and Anxiety Medications. You were cared for by a hospitalist during your hospital stay. If you have any questions about your discharge medications or the care you received while you were in the hospital after you are discharged, you can call the unit and ask to speak with the hospitalist on call if the hospitalist that took care of you is not available.  Once you are discharged, your primary care physician will handle any further medical issues. Please note that NO REFILLS for any discharge medications will be authorized once you are discharged, as it is imperative that you return to your primary care physician (or establish a relationship with a primary care physician if you do not have one) for your aftercare needs so that they can reassess your need  for medications and monitor your lab values. You Must read complete instructions/literature along with all the possible adverse reactions/side effects for all the Medicines you take and that have been prescribed to you. Take any new Medicines after you have completely understood and accept all the possible adverse reactions/side effects. Wear Seat belts while driving. If you have smoked or chewed Tobacco in the last 2 yrs please stop smoking and/or stop any Recreational drug use.   Increase activity slowly   Complete by:  As directed      Discharge Exam: Filed Weights   08/11/17 2000  Weight: 63.4 kg (139 lb 12.4 oz)   Vitals:   08/15/17 0637 08/15/17 1454  BP: 127/80 116/74  Pulse:  89 (!) 102  Resp: 12 14  Temp: 98.2 F (36.8 C) 98.1 F (36.7 C)  SpO2: 95% 97%   General: Appear in no distress, no Rash; Oral Mucosa moist. Cardiovascular: S1 and S2 Present, no Murmur, no JVD Respiratory: Bilateral Air entry present and Clear to Auscultation, no Crackles, no wheezes Abdomen: Bowel Sound present, Soft and no tenderness Extremities: no Pedal edema, no calf tenderness Neurology: Grossly no focal neuro deficit.  The results of significant diagnostics from this hospitalization (including imaging, microbiology, ancillary and laboratory) are listed below for reference.    Significant Diagnostic Studies: Dg Chest Port 1 View  Result Date: 08/11/2017 CLINICAL DATA:  Generalized weakness, shortness of breath. EXAM: PORTABLE CHEST 1 VIEW COMPARISON:  Radiograph of May 05, 2017. FINDINGS: The heart size and mediastinal contours are within normal limits. No pneumothorax is noted. Left lung is clear. Right internal jugular Port-A-Cath is unchanged in position. Mild right basilar atelectasis is noted with associated pleural effusion. The visualized skeletal structures are unremarkable. IMPRESSION: Mild right basilar subsegmental atelectasis is noted with associated pleural effusion. Electronically Signed   By: Marijo Conception, M.D.   On: 08/11/2017 14:00    Microbiology: Recent Results (from the past 240 hour(s))  Urine Culture     Status: Abnormal   Collection Time: 08/11/17  1:24 PM  Result Value Ref Range Status   Specimen Description   Final    URINE, RANDOM Performed at [facility] [address] [place], [place] [address]    Special Requests   Final    NONE Performed at [facility], [place] [address] [place], [place] [address]    Culture MULTIPLE SPECIES PRESENT, SUGGEST RECOLLECTION (A)  Final   Report Status 08/13/2017 FINAL  Final     Labs: CBC: Recent Labs  Lab 08/11/17 1118 08/12/17 0811 08/13/17 0416  08/13/17 1025 08/14/17 1207 08/15/17 0811  WBC 2.9* 2.0* QUESTIONABLE RESULTS, RECOMMEND RECOLLECT TO VERIFY 2.0* 4.3 5.4  NEUTROABS 0.9*  --  QUESTIONABLE RESULTS, RECOMMEND RECOLLECT TO VERIFY 0.5*  --   --   HGB 9.1* 7.9* QUESTIONABLE RESULTS, RECOMMEND RECOLLECT TO VERIFY 7.7* 8.7* 7.8*  HCT 28.4* 24.2* QUESTIONABLE RESULTS, RECOMMEND RECOLLECT TO VERIFY 23.5* 27.4* 24.1*  MCV 88.8 89.3 QUESTIONABLE RESULTS, RECOMMEND RECOLLECT TO VERIFY 89.0 88.4 88.0  PLT 153 118* QUESTIONABLE RESULTS, RECOMMEND RECOLLECT TO VERIFY 146* 256 973   Basic Metabolic Panel: Recent Labs  Lab 08/11/17 1118 08/12/17 0811 08/13/17 0416 08/13/17 1025 08/14/17 1207 08/15/17 0811  NA 140 140 QUESTIONABLE RESULTS, RECOMMEND RECOLLECT TO VERIFY 137 140 138  K 3.4* 4.4 QUESTIONABLE RESULTS, RECOMMEND RECOLLECT TO VERIFY 4.1 3.8 3.4*  CL 101 107 QUESTIONABLE RESULTS, RECOMMEND RECOLLECT TO VERIFY 106 104 103  CO2 26  23 QUESTIONABLE RESULTS, RECOMMEND RECOLLECT TO VERIFY 22 24 24   GLUCOSE 106* 114* QUESTIONABLE RESULTS, RECOMMEND RECOLLECT TO VERIFY 117* 116* 94  BUN 32* 29* QUESTIONABLE RESULTS, RECOMMEND RECOLLECT TO VERIFY 34* 33* 32*  CREATININE 1.60* 1.51* QUESTIONABLE RESULTS, RECOMMEND RECOLLECT TO VERIFY 1.35* 1.47* 1.40*  CALCIUM 8.2* 8.0* QUESTIONABLE RESULTS, RECOMMEND RECOLLECT TO VERIFY 7.9* 8.7* 8.0*  MG 1.6* 1.5* QUESTIONABLE RESULTS, RECOMMEND RECOLLECT TO VERIFY 1.5*  --   --    Liver Function Tests: Recent Labs  Lab 08/11/17 1118  AST 20  ALT 14  ALKPHOS 122  BILITOT 0.9  PROT 6.1*  ALBUMIN 2.0*   Recent Labs  Lab 08/11/17 1118  LIPASE 19   No results for input(s): AMMONIA in the last 168 hours. Cardiac Enzymes: Recent Labs  Lab 08/11/17 1118  TROPONINI 0.05*   BNP (last 3 results) No results for input(s): BNP in the last 8760 hours. CBG: No results for input(s): GLUCAP in the last 168 hours. Time spent: 35 minutes  Signed:  Berle Mull  [facility]   08/15/2017  , 4:54 PM

## 2017-08-15 NOTE — ED Notes (Signed)
Bed: WTR5 Expected date:  Expected time:  Means of arrival:  Comments: 

## 2017-08-16 NOTE — Progress Notes (Signed)
TTS consult ordered at 00:08. TTS s/w Dr. Stark Jock, MD. Pt denies SI, HI, and AVH. Pt is being d/c, TTS consult to be rescinded.   Lind Covert, MSW, LCSW Therapeutic Triage Specialist  [phone]

## 2017-08-16 NOTE — Discharge Instructions (Addendum)
Continue your medications as previously prescribed.  Follow-up with your oncologist on Monday, and return to the ER if symptoms worsen or change.

## 2017-08-16 NOTE — ED Provider Notes (Signed)
Emily Duarte DEPT Provider Note   CSN: [id number] Arrival date & time: 08/15/17  2142     History   Chief Complaint Chief Complaint  Patient presents with  . Medical Clearance    HPI Emily Duarte is a 67 y.o. female.  Patient is a 67 year old female with past medical history of endometrial cancer currently undergoing chemotherapy, schizoaffective disorder.  She presents today for evaluation of possible depression.  Her son is concerned that she is depressed.  He reports that she is less active with less appetite and has not been eating or drinking.  He is concerned that she may be experiencing a mental health crisis.  The patient denies to me that she is suicidal or homicidal.  She tells me that her appetite is decreased, however she has eaten today.  She denies any abdominal pain, fevers, chest pain, or other complaints.  The history is provided by the patient and a relative.    Past Medical History:  Diagnosis Date  . Endometrial cancer ([place])   . Hemorrhoid   . Hypertension   . Paranoid schizophrenia ([place])   . Snake bite     Patient Active Problem List   Diagnosis Date Noted  . Protein-calorie malnutrition, severe 08/13/2017  . UTI (urinary tract infection) 08/11/2017  . Hypomagnesemia 06/23/2017  . Acute prerenal failure ([place]) 06/23/2017  . Left femoral vein DVT ([place]) 06/20/2017  . Lung nodule < 6cm on CT 06/06/2017  . Hypokalemia 06/06/2017  . Nausea with vomiting 06/06/2017  . Delusions ([place])   . Goals of care, counseling/discussion 03/12/2017  . Cancer associated pain 03/12/2017  . Protein-calorie malnutrition, moderate ([place]) 03/03/2017  . Schizoaffective disorder, depressive type ([place]) 02/05/2017  . Peripheral neuropathy due to chemotherapy ([place]) 12/09/2016  . Elevated liver enzymes 09/06/2016  . Anemia, chronic disease 09/06/2016  . Endometrial/uterine adenocarcinoma ([place]) 08/19/2016  . Bipolar 1 disorder, mixed, severe  ([place]) 08/07/2012  . Cyst of soft tissue 08/07/2012    Past Surgical History:  Procedure Laterality Date  . ENDOMETRIAL BIOPSY  08/11/2016  . IR FLUORO GUIDE PORT INSERTION RIGHT  09/05/2016  . IR US GUIDE VASC ACCESS RIGHT  09/05/2016  . TUBAL LIGATION       OB History    Gravida  1   Para  1   Term      Preterm      AB      Living  1     SAB      TAB      Ectopic      Multiple      Live Births  1            Home Medications    Prior to Admission medications   Medication Sig Start Date End Date Taking? Authorizing Provider  dexamethasone (DECADRON) 4 MG tablet Take 1 tablet (4 mg total) by mouth 2 (two) times daily with a meal. 07/11/17  Yes Gorsuch, Ni, MD  divalproex (DEPAKOTE ER) 500 MG 24 hr tablet Take 500 mg by mouth at bedtime.   Yes [provider]  feeding supplement, ENSURE ENLIVE, (ENSURE ENLIVE) LIQD Take 237 mLs by mouth 2 (two) times daily between meals. 08/14/17  Yes Lavina Hamman, MD  HYDROcodone-acetaminophen [facility]) 10-325 MG tablet Take 1 tablet by mouth every 4 (four) hours as needed. Patient taking differently: Take 1 tablet by mouth every 4 (four) hours as needed for moderate pain or severe pain.  08/14/17  Yes  Lavina Hamman, MD  magnesium oxide (MAG-OX) 400 (241.3 Mg) MG tablet Take 1 tablet (400 mg total) by mouth 2 (two) times daily. 06/23/17  Yes Gorsuch, Ni, MD  ondansetron (ZOFRAN) 8 MG tablet Take 1 tablet (8 mg total) by mouth every 8 (eight) hours as needed for nausea. 04/10/17  Yes Gorsuch, Ni, MD  potassium chloride SA (K-DUR,KLOR-CON) 10 MEQ tablet Take 1 tablet (10 mEq total) by mouth daily. 08/14/17  Yes Lavina Hamman, MD  prochlorperazine (COMPAZINE) 10 MG tablet Take 1 tablet (10 mg total) by mouth every 6 (six) hours as needed for nausea or vomiting. 04/10/17  Yes Gorsuch, Ni, MD  risperiDONE (RISPERDAL) 1 MG tablet Take 1 mg by mouth daily. 05/13/17  Yes [provider]  rivaroxaban (XARELTO) 20 MG TABS  tablet Take 1 tablet (20 mg total) by mouth daily with supper. 08/14/17  Yes Lavina Hamman, MD    Family History Family History  Problem Relation Age of Onset  . Colon cancer Mother        late 74s  . Schizophrenia Sister   . Colon cancer Sister        late 61 s    Social History Social History   Tobacco Use  . Smoking status: Never Smoker  . Smokeless tobacco: Never Used  Substance Use Topics  . Alcohol use: No  . Drug use: No     Allergies   Patient has no known allergies.   Review of Systems Review of Systems  All other systems reviewed and are negative.    Physical Exam Updated Vital Signs There were no vitals taken for this visit.  Physical Exam  Constitutional: She is oriented to person, place, and time. No distress.  Patient is an elderly female, chronically ill-appearing.  HENT:  Head: Normocephalic and atraumatic.  Mouth/Throat: Oropharynx is clear and moist.  Neck: Normal range of motion. Neck supple.  Cardiovascular: Normal rate and regular rhythm. Exam reveals no gallop and no friction rub.  No murmur heard. Pulmonary/Chest: Effort normal and breath sounds normal. No respiratory distress. She has no wheezes.  Abdominal: Soft. Bowel sounds are normal. She exhibits no distension. There is no tenderness.  Musculoskeletal: Normal range of motion.  Neurological: She is alert and oriented to person, place, and time. No cranial nerve deficit.  Skin: Skin is warm and dry. She is not diaphoretic.  Nursing note and vitals reviewed.    ED Treatments / Results  Labs (all labs ordered are listed, but only abnormal results are displayed) Labs Reviewed  COMPREHENSIVE METABOLIC PANEL - Abnormal; Notable for the following components:      Result Value   Chloride 97 (*)    Glucose, Bld 120 (*)    BUN 34 (*)    Creatinine, Ser 1.52 (*)    Calcium 8.4 (*)    Total Protein 5.8 (*)    Albumin 2.0 (*)    Alkaline Phosphatase 127 (*)    Total Bilirubin 0.1  (*)    GFR calc non Af Amer 34 (*)    GFR calc Af Amer 40 (*)    All other components within normal limits  ACETAMINOPHEN LEVEL - Abnormal; Notable for the following components:   Acetaminophen (Tylenol), Serum <10 (*)    All other components within normal limits  CBC - Abnormal; Notable for the following components:   RBC 3.08 (*)    Hemoglobin 8.7 (*)    HCT 27.0 (*)    RDW  15.6 (*)    All other components within normal limits  ETHANOL  SALICYLATE LEVEL  RAPID URINE DRUG SCREEN, HOSP PERFORMED    EKG None  Radiology No results found.  Procedures Procedures (including critical care time)  Medications Ordered in ED Medications - No data to display   Initial Impression / Assessment and Plan / ED Course  I have reviewed the triage vital signs and the nursing notes.  Pertinent labs & imaging results that were available during my care of the patient were reviewed by me and considered in my medical decision making (see chart for details).  After my evaluation, the patient has decided that she would like to go home.  I have discussed this with the son who is comfortable with the disposition.  He will observe her this weekend and follow-up with the oncologist next week.  Final Clinical Impressions(s) / ED Diagnoses   Final diagnoses:  None    ED Discharge Orders    None       Veryl Speak, MD 08/16/17 [phone]

## 2017-08-17 ENCOUNTER — Emergency Department (HOSPITAL_COMMUNITY): Payer: Medicare Other

## 2017-08-17 ENCOUNTER — Other Ambulatory Visit: Payer: Self-pay

## 2017-08-17 ENCOUNTER — Encounter (HOSPITAL_COMMUNITY): Payer: Self-pay

## 2017-08-17 ENCOUNTER — Inpatient Hospital Stay (HOSPITAL_COMMUNITY)
Admission: EM | Admit: 2017-08-17 | Discharge: 2017-08-20 | DRG: 640 | Disposition: A | Discharge status: Skilled Nursing Facility | Payer: Medicare Other | Attending: Internal Medicine | Admitting: Internal Medicine

## 2017-08-17 DIAGNOSIS — D638 Anemia in other chronic diseases classified elsewhere: Secondary | ICD-10-CM | POA: Diagnosis present

## 2017-08-17 DIAGNOSIS — Z8542 Personal history of malignant neoplasm of other parts of uterus: Secondary | ICD-10-CM

## 2017-08-17 DIAGNOSIS — R339 Retention of urine, unspecified: Secondary | ICD-10-CM | POA: Diagnosis present

## 2017-08-17 DIAGNOSIS — J90 Pleural effusion, not elsewhere classified: Secondary | ICD-10-CM | POA: Diagnosis present

## 2017-08-17 DIAGNOSIS — Z9221 Personal history of antineoplastic chemotherapy: Secondary | ICD-10-CM

## 2017-08-17 DIAGNOSIS — E872 Acidosis, unspecified: Secondary | ICD-10-CM

## 2017-08-17 DIAGNOSIS — K5900 Constipation, unspecified: Secondary | ICD-10-CM | POA: Diagnosis present

## 2017-08-17 DIAGNOSIS — I959 Hypotension, unspecified: Secondary | ICD-10-CM

## 2017-08-17 DIAGNOSIS — E43 Unspecified severe protein-calorie malnutrition: Secondary | ICD-10-CM | POA: Diagnosis present

## 2017-08-17 DIAGNOSIS — D72829 Elevated white blood cell count, unspecified: Secondary | ICD-10-CM

## 2017-08-17 DIAGNOSIS — T451X5A Adverse effect of antineoplastic and immunosuppressive drugs, initial encounter: Secondary | ICD-10-CM | POA: Diagnosis present

## 2017-08-17 DIAGNOSIS — E8809 Other disorders of plasma-protein metabolism, not elsewhere classified: Secondary | ICD-10-CM | POA: Diagnosis present

## 2017-08-17 DIAGNOSIS — Z6827 Body mass index (BMI) 27.0-27.9, adult: Secondary | ICD-10-CM

## 2017-08-17 DIAGNOSIS — E2749 Other adrenocortical insufficiency: Secondary | ICD-10-CM | POA: Diagnosis present

## 2017-08-17 DIAGNOSIS — C541 Malignant neoplasm of endometrium: Secondary | ICD-10-CM | POA: Diagnosis present

## 2017-08-17 DIAGNOSIS — N183 Chronic kidney disease, stage 3 (moderate): Secondary | ICD-10-CM | POA: Diagnosis present

## 2017-08-17 DIAGNOSIS — Z7952 Long term (current) use of systemic steroids: Secondary | ICD-10-CM

## 2017-08-17 DIAGNOSIS — R6510 Systemic inflammatory response syndrome (SIRS) of non-infectious origin without acute organ dysfunction: Secondary | ICD-10-CM | POA: Diagnosis present

## 2017-08-17 DIAGNOSIS — E860 Dehydration: Principal | ICD-10-CM | POA: Diagnosis present

## 2017-08-17 DIAGNOSIS — D6481 Anemia due to antineoplastic chemotherapy: Secondary | ICD-10-CM | POA: Diagnosis present

## 2017-08-17 DIAGNOSIS — F200 Paranoid schizophrenia: Secondary | ICD-10-CM | POA: Diagnosis present

## 2017-08-17 DIAGNOSIS — Z79899 Other long term (current) drug therapy: Secondary | ICD-10-CM

## 2017-08-17 DIAGNOSIS — F209 Schizophrenia, unspecified: Secondary | ICD-10-CM

## 2017-08-17 DIAGNOSIS — R000 Tachycardia, unspecified: Secondary | ICD-10-CM

## 2017-08-17 DIAGNOSIS — I82402 Acute embolism and thrombosis of unspecified deep veins of left lower extremity: Secondary | ICD-10-CM | POA: Diagnosis present

## 2017-08-17 DIAGNOSIS — F251 Schizoaffective disorder, depressive type: Secondary | ICD-10-CM | POA: Diagnosis present

## 2017-08-17 DIAGNOSIS — Z86718 Personal history of other venous thrombosis and embolism: Secondary | ICD-10-CM

## 2017-08-17 DIAGNOSIS — I129 Hypertensive chronic kidney disease with stage 1 through stage 4 chronic kidney disease, or unspecified chronic kidney disease: Secondary | ICD-10-CM | POA: Diagnosis present

## 2017-08-17 DIAGNOSIS — Z7901 Long term (current) use of anticoagulants: Secondary | ICD-10-CM

## 2017-08-17 DIAGNOSIS — R140 Abdominal distension (gaseous): Secondary | ICD-10-CM

## 2017-08-17 DIAGNOSIS — F319 Bipolar disorder, unspecified: Secondary | ICD-10-CM | POA: Diagnosis present

## 2017-08-17 LAB — CBC WITH DIFFERENTIAL/PLATELET
BASOS PCT: 0 %
Band Neutrophils: 15 %
Basophils Absolute: 0 10*3/uL (ref 0.0–0.1)
Eosinophils Absolute: 0.1 10*3/uL (ref 0.0–0.7)
Eosinophils Relative: 1 %
HCT: 28.7 % — ABNORMAL LOW (ref 36.0–46.0)
Hemoglobin: 9.1 g/dL — ABNORMAL LOW (ref 12.0–15.0)
Lymphocytes Relative: 35 %
Lymphs Abs: 4.2 10*3/uL — ABNORMAL HIGH (ref 0.7–4.0)
MCH: 28.5 pg (ref 26.0–34.0)
MCHC: 31.7 g/dL (ref 30.0–36.0)
MCV: 90 fL (ref 78.0–100.0)
MONO ABS: 0.7 10*3/uL (ref 0.1–1.0)
MYELOCYTES: 9 %
Metamyelocytes Relative: 4 %
Monocytes Relative: 6 %
NEUTROS ABS: 7.1 10*3/uL (ref 1.7–7.7)
NEUTROS PCT: 30 %
Platelets: 324 10*3/uL (ref 150–400)
RBC: 3.19 MIL/uL — ABNORMAL LOW (ref 3.87–5.11)
RDW: 16.2 % — AB (ref 11.5–15.5)
WBC: 12.1 10*3/uL — AB (ref 4.0–10.5)

## 2017-08-17 LAB — COMPREHENSIVE METABOLIC PANEL
ALT: 19 U/L (ref 14–54)
AST: 32 U/L (ref 15–41)
Albumin: 1.8 g/dL — ABNORMAL LOW (ref 3.5–5.0)
Alkaline Phosphatase: 114 U/L (ref 38–126)
Anion gap: 12 (ref 5–15)
BILIRUBIN TOTAL: 0.3 mg/dL (ref 0.3–1.2)
BUN: 31 mg/dL — ABNORMAL HIGH (ref 6–20)
CALCIUM: 8.6 mg/dL — AB (ref 8.9–10.3)
CO2: 28 mmol/L (ref 22–32)
Chloride: 98 mmol/L — ABNORMAL LOW (ref 101–111)
Creatinine, Ser: 1.39 mg/dL — ABNORMAL HIGH (ref 0.44–1.00)
GFR, EST AFRICAN AMERICAN: 44 mL/min — AB (ref >60)
GFR, EST NON AFRICAN AMERICAN: 38 mL/min — AB (ref >60)
Glucose, Bld: 100 mg/dL — ABNORMAL HIGH (ref 65–99)
Potassium: 3.7 mmol/L (ref 3.5–5.1)
Sodium: 138 mmol/L (ref 135–145)
Total Protein: 5.9 g/dL — ABNORMAL LOW (ref 6.5–8.1)

## 2017-08-17 LAB — URINALYSIS, ROUTINE W REFLEX MICROSCOPIC
BILIRUBIN URINE: NEGATIVE
Glucose, UA: NEGATIVE mg/dL
Hgb urine dipstick: NEGATIVE
Ketones, ur: NEGATIVE mg/dL
Leukocytes, UA: NEGATIVE
NITRITE: NEGATIVE
PH: 5 (ref 5.0–8.0)
Protein, ur: NEGATIVE mg/dL
Specific Gravity, Urine: 1.014 (ref 1.005–1.030)

## 2017-08-17 LAB — I-STAT CG4 LACTIC ACID, ED
Lactic Acid, Venous: 2.84 mmol/L (ref 0.5–1.9)
Lactic Acid, Venous: 1.07 mmol/L (ref 0.5–1.9)

## 2017-08-17 LAB — PROTIME-INR
INR: 1.25
PROTHROMBIN TIME: 15.6 s — AB (ref 11.4–15.2)

## 2017-08-17 LAB — I-STAT TROPONIN, ED: Troponin i, poc: 0.08 ng/mL (ref 0.00–0.08)

## 2017-08-17 MED ORDER — HYDROCORTISONE NA SUCCINATE PF 100 MG IJ SOLR: 100.0000 mg | Freq: Once | INTRAMUSCULAR | Status: DC

## 2017-08-17 MED ORDER — DEXAMETHASONE SODIUM PHOSPHATE 4 MG/ML IJ SOLN
4.0000 mg | Freq: Once | INTRAMUSCULAR | Status: AC
  Administered 2017-08-17: 4 mg via INTRAVENOUS
  Filled 2017-08-17: qty 1

## 2017-08-17 MED ORDER — ONDANSETRON HCL 4 MG PO TABS: 4.0000 mg | ORAL_TABLET | Freq: Four times a day (QID) | ORAL | Status: DC | PRN

## 2017-08-17 MED ORDER — POLYETHYLENE GLYCOL 3350 17 G PO PACK
17.0000 g | PACK | Freq: Every day | ORAL | Status: DC
  Administered 2017-08-17 – 2017-08-20 (×3): 17 g via ORAL
  Filled 2017-08-17 (×3): qty 1

## 2017-08-17 MED ORDER — DOCUSATE SODIUM 100 MG PO CAPS
100.0000 mg | ORAL_CAPSULE | Freq: Two times a day (BID) | ORAL | Status: DC
  Administered 2017-08-17 – 2017-08-20 (×6): 100 mg via ORAL
  Filled 2017-08-17 (×6): qty 1

## 2017-08-17 MED ORDER — MAGNESIUM OXIDE 400 (241.3 MG) MG PO TABS
400.0000 mg | ORAL_TABLET | Freq: Two times a day (BID) | ORAL | Status: DC
  Administered 2017-08-17 – 2017-08-20 (×6): 400 mg via ORAL
  Filled 2017-08-17 (×6): qty 1

## 2017-08-17 MED ORDER — RISPERIDONE 1 MG PO TABS
1.0000 mg | ORAL_TABLET | Freq: Every day | ORAL | Status: DC
  Administered 2017-08-17 – 2017-08-20 (×4): 1 mg via ORAL
  Filled 2017-08-17 (×4): qty 1

## 2017-08-17 MED ORDER — SODIUM CHLORIDE 0.9 % IV BOLUS (SEPSIS)
1000.0000 mL | Freq: Once | INTRAVENOUS | Status: AC
  Administered 2017-08-17: 1000 mL via INTRAVENOUS

## 2017-08-17 MED ORDER — ENSURE ENLIVE PO LIQD
237.0000 mL | Freq: Two times a day (BID) | ORAL | Status: DC
  Administered 2017-08-18 – 2017-08-20 (×5): 237 mL via ORAL

## 2017-08-17 MED ORDER — SODIUM CHLORIDE 0.9 % IV SOLN
INTRAVENOUS | Status: DC
  Administered 2017-08-17: 20:00:00 via INTRAVENOUS

## 2017-08-17 MED ORDER — POTASSIUM CHLORIDE CRYS ER 10 MEQ PO TBCR
10.0000 meq | EXTENDED_RELEASE_TABLET | Freq: Every day | ORAL | Status: DC
  Administered 2017-08-17 – 2017-08-20 (×4): 10 meq via ORAL
  Filled 2017-08-17 (×4): qty 1

## 2017-08-17 MED ORDER — DEXAMETHASONE 4 MG PO TABS
4.0000 mg | ORAL_TABLET | Freq: Two times a day (BID) | ORAL | Status: DC
  Administered 2017-08-17 – 2017-08-20 (×6): 4 mg via ORAL
  Filled 2017-08-17 (×6): qty 1

## 2017-08-17 MED ORDER — HYDROCODONE-ACETAMINOPHEN 10-325 MG PO TABS: 1.0000 | ORAL_TABLET | ORAL | Status: DC | PRN

## 2017-08-17 MED ORDER — DIVALPROEX SODIUM ER 500 MG PO TB24
500.0000 mg | ORAL_TABLET | Freq: Every day | ORAL | Status: DC
  Administered 2017-08-17 – 2017-08-19 (×3): 500 mg via ORAL
  Filled 2017-08-17 (×3): qty 1

## 2017-08-17 MED ORDER — ACETAMINOPHEN 650 MG RE SUPP: 650.0000 mg | Freq: Four times a day (QID) | RECTAL | Status: DC | PRN

## 2017-08-17 MED ORDER — ACETAMINOPHEN 325 MG PO TABS: 650.0000 mg | ORAL_TABLET | Freq: Four times a day (QID) | ORAL | Status: DC | PRN

## 2017-08-17 MED ORDER — PROCHLORPERAZINE MALEATE 10 MG PO TABS: 10.0000 mg | ORAL_TABLET | Freq: Four times a day (QID) | ORAL | Status: DC | PRN

## 2017-08-17 MED ORDER — RIVAROXABAN 20 MG PO TABS
20.0000 mg | ORAL_TABLET | Freq: Every day | ORAL | Status: DC
  Administered 2017-08-17 – 2017-08-19 (×3): 20 mg via ORAL
  Filled 2017-08-17 (×3): qty 1

## 2017-08-17 MED ORDER — ONDANSETRON HCL 4 MG/2ML IJ SOLN: 4.0000 mg | Freq: Four times a day (QID) | INTRAMUSCULAR | Status: DC | PRN

## 2017-08-17 MED ORDER — SODIUM CHLORIDE 0.9 % IV SOLN
2.0000 g | Freq: Once | INTRAVENOUS | Status: AC
  Administered 2017-08-17: 2 g via INTRAVENOUS
  Filled 2017-08-17: qty 2

## 2017-08-17 NOTE — ED Notes (Signed)
RN spoke with Floor RN. RN will transport at end of shift change after 1920. RN Irfa will inform oncoming RN and I will inform ED oncoming RN.

## 2017-08-17 NOTE — ED Notes (Signed)
ED TO INPATIENT HANDOFF REPORT  Name/Age/Gender Emily Duarte 67 y.o. female  Code Status    Code Status Orders  (From admission, onward)        Start     Ordered   08/17/17 1714  Full code  Continuous     08/17/17 1716    Code Status History    Date Active Date Inactive Code Status Order ID Comments User Context   08/11/2017 1543 08/15/2017 2142 Full Code [id number]  Kayleen Memos, DO ED   05/03/2017 0720 05/05/2017 1653 Full Code [id number]  Lorin Glass, PA-C ED   02/04/2017 1224 02/05/2017 1442 Full Code [id number]  Malvin Johns, MD ED   08/02/2016 1528 08/06/2016 1128 Full Code [id number]  Jola Schmidt, MD ED   08/06/2012 1652 08/07/2012 0243 Full Code [id number]  Norman Herrlich, NP ED      Home/SNF/Other Home  Chief Complaint hypotension  Level of Care/Admitting Diagnosis ED Disposition    ED Disposition Condition [facility] Area: [facility] [[id number]]  Level of Care: Med-Surg [16]  Diagnosis: Dehydration [276.51.ICD-9-CM]  Admitting Physician: Lavina Hamman [[id number]]  Attending Physician: Lavina Hamman [[id number]]  PT Class (Do Not Modify): Observation [104]  PT Acc Code (Do Not Modify): Observation [10022]       Medical History Past Medical History:  Diagnosis Date  . Endometrial cancer ([place])   . Hemorrhoid   . Hypertension   . Paranoid schizophrenia ([place])   . Snake bite     Allergies No Known Allergies  IV Location/Drains/Wounds Patient Lines/Drains/Airways Status   Active Line/Drains/Airways    Name:   Placement date:   Placement time:   Site:   Days:   Implanted Port 08/13/17 Right Chest   08/13/17    0044    Chest   4   Peripheral IV 08/17/17 Left Antecubital   08/17/17    1334    Antecubital   less than 1          Labs/Imaging Results for orders placed or performed during the hospital encounter of 08/17/17 (from the past 48 hour(s))  Comprehensive metabolic panel     Status: Abnormal    Collection Time: 08/17/17  1:50 PM  Result Value Ref Range   Sodium 138 135 - 145 mmol/L   Potassium 3.7 3.5 - 5.1 mmol/L   Chloride 98 (L) 101 - 111 mmol/L   CO2 28 22 - 32 mmol/L   Glucose, Bld 100 (H) 65 - 99 mg/dL   BUN 31 (H) 6 - 20 mg/dL   Creatinine, Ser 1.39 (H) 0.44 - 1.00 mg/dL   Calcium 8.6 (L) 8.9 - 10.3 mg/dL   Total Protein 5.9 (L) 6.5 - 8.1 g/dL   Albumin 1.8 (L) 3.5 - 5.0 g/dL   AST 32 15 - 41 U/L   ALT 19 14 - 54 U/L   Alkaline Phosphatase 114 38 - 126 U/L   Total Bilirubin 0.3 0.3 - 1.2 mg/dL   GFR calc non Af Amer 38 (L) >60 mL/min   GFR calc Af Amer 44 (L) >60 mL/min    Comment: (NOTE) The eGFR has been calculated using the CKD EPI equation. This calculation has not been validated in all clinical situations. eGFR's persistently <60 mL/min signify possible Chronic Kidney Disease.    Anion gap 12 5 - 15    Comment: Performed at [facility], [place] [address], [place], [place] [address]  CBC  with Differential     Status: Abnormal   Collection Time: 08/17/17  1:50 PM  Result Value Ref Range   WBC 12.1 (H) 4.0 - 10.5 K/uL   RBC 3.19 (L) 3.87 - 5.11 MIL/uL   Hemoglobin 9.1 (L) 12.0 - 15.0 g/dL   HCT 28.7 (L) 36.0 - 46.0 %   MCV 90.0 78.0 - 100.0 fL   MCH 28.5 26.0 - 34.0 pg   MCHC 31.7 30.0 - 36.0 g/dL   RDW 16.2 (H) 11.5 - 15.5 %   Platelets 324 150 - 400 K/uL   Neutrophils Relative % 30 %   Lymphocytes Relative 35 %   Monocytes Relative 6 %   Eosinophils Relative 1 %   Basophils Relative 0 %   Band Neutrophils 15 %   Metamyelocytes Relative 4 %   Myelocytes 9 %   Neutro Abs 7.1 1.7 - 7.7 K/uL   Lymphs Abs 4.2 (H) 0.7 - 4.0 K/uL   Monocytes Absolute 0.7 0.1 - 1.0 K/uL   Eosinophils Absolute 0.1 0.0 - 0.7 K/uL   Basophils Absolute 0.0 0.0 - 0.1 K/uL   RBC Morphology RARE NRBCs    WBC Morphology      MARKED LEFT SHIFT (>5% METAS,MYELOS AND PROS, OCC BLAST NOTED)   Smear Review      PATH REVIEW HAS BEEN ORDERED ON THIS ACCESSION  NUMBER    Comment: Performed at [facility], [place] [address] [place], [place] [address]  Protime-INR     Status: Abnormal   Collection Time: 08/17/17  1:50 PM  Result Value Ref Range   Prothrombin Time 15.6 (H) 11.4 - 15.2 seconds   INR 1.25     Comment: Performed at [facility], [place] [address] [place], [place] [address]  I-Stat CG4 Lactic Acid, ED     Status: Abnormal   Collection Time: 08/17/17  2:00 PM  Result Value Ref Range   Lactic Acid, Venous 2.84 (HH) 0.5 - 1.9 mmol/L   Comment NOTIFIED PHYSICIAN   I-Stat Troponin, ED (not at [facility])     Status: None   Collection Time: 08/17/17  2:01 PM  Result Value Ref Range   Troponin i, poc 0.08 0.00 - 0.08 ng/mL   Comment 3            Comment: Due to the release kinetics of cTnI, a negative result within the first hours of the onset of symptoms does not rule out myocardial infarction with certainty. If myocardial infarction is still suspected, repeat the test at appropriate intervals.   I-Stat CG4 Lactic Acid, ED     Status: None   Collection Time: 08/17/17  4:13 PM  Result Value Ref Range   Lactic Acid, Venous 1.07 0.5 - 1.9 mmol/L  Urinalysis, Routine w reflex microscopic     Status: Abnormal   Collection Time: 08/17/17  5:00 PM  Result Value Ref Range   Color, Urine YELLOW YELLOW   APPearance CLEAR CLEAR   Specific Gravity, Urine 1.014 1.005 - 1.030   pH 5.0 5.0 - 8.0   Glucose, UA NEGATIVE NEGATIVE mg/dL   Hgb urine dipstick NEGATIVE NEGATIVE   Bilirubin Urine NEGATIVE NEGATIVE   Ketones, ur NEGATIVE NEGATIVE mg/dL   Protein, ur NEGATIVE NEGATIVE mg/dL   Nitrite NEGATIVE NEGATIVE   Leukocytes, UA NEGATIVE NEGATIVE   RBC / HPF 0-5 0 - 5 RBC/hpf   WBC, UA 0-5 0 - 5 WBC/hpf   Bacteria, UA FEW (A) NONE SEEN  Comment: Performed at [facility], [place] [address], [place], [place] [address]   Dg Chest 2 View  Result Date: 08/17/2017 CLINICAL DATA:  Hypotension today.  Undergoing chemotherapy for endometrial cancer. EXAM: CHEST - 2 VIEW COMPARISON:  08/11/2017 FINDINGS: Right IJ Port-A-Cath is present with tip over the right atrium unchanged. Patient is rotated to the left. Lungs are adequately inflated with hazy opacification over the right mid to lower lung slightly worse compatible with moderate size effusion with associated atelectasis. Slight worsened left base opacification compatible with small effusion with atelectasis. Cardiomediastinal silhouette and remainder the exam is unchanged. IMPRESSION: Worsening moderate size right effusion and worsening small left effusion likely with associated bibasilar atelectasis. Right IJ Port-A-Cath unchanged with tip over the right atrium. Electronically Signed   By: Marin Olp M.D.   On: 08/17/2017 16:06   Dg Abd Portable 1v  Result Date: 08/17/2017 CLINICAL DATA:  Abdominal distention. EXAM: PORTABLE ABDOMEN - 1 VIEW COMPARISON:  CT abdomen pelvis dated June 20, 2017. FINDINGS: The bowel gas pattern is normal. Moderate colonic stool burden, unchanged. No radio-opaque calculi or other significant radiographic abnormality are seen. Multiple phleboliths in the pelvis. No acute osseous abnormality. IMPRESSION: 1. Prominent stool throughout the colon favors constipation. No obstruction. Electronically Signed   By: Titus Dubin M.D.   On: 08/17/2017 17:44    Pending Labs Unresulted Labs (From admission, onward)   Start     Ordered   08/18/17 0500  Comprehensive metabolic panel  Tomorrow morning,   R     08/17/17 1716   08/18/17 0500  CBC  Tomorrow morning,   R     08/17/17 1716   08/18/17 0500  Cortisol-am, blood  Tomorrow morning,   R     08/17/17 1836   08/17/17 1700  Urine culture  STAT,   STAT     08/17/17 1659   08/17/17 1407  Blood Culture (routine x 2)  BLOOD CULTURE X 2,   STAT     08/17/17 1407   08/17/17 1350  Pathologist smear review  Once,   STAT     08/17/17 1350      Vitals/Pain Today's Vitals    08/17/17 1745 08/17/17 1800 08/17/17 1815 08/17/17 1830  BP: 96/60 94/60 96/61  (!) 92/58  Pulse: (!) 104 (!) 106 (!) 103 (!) 101  Resp: 18 18 18 17   Temp:      TempSrc:      SpO2: 95% 95% 96% 95%  Weight:      Height:      PainSc:        Isolation Precautions No active isolations  Medications Medications  dexamethasone (DECADRON) tablet 4 mg (has no administration in time range)  divalproex (DEPAKOTE ER) 24 hr tablet 500 mg (has no administration in time range)  feeding supplement (ENSURE ENLIVE) (ENSURE ENLIVE) liquid 237 mL (has no administration in time range)  HYDROcodone-acetaminophen (NORCO) 10-325 MG per tablet 1 tablet (has no administration in time range)  magnesium oxide (MAG-OX) tablet 400 mg (has no administration in time range)  potassium chloride (K-DUR,KLOR-CON) CR tablet 10 mEq (has no administration in time range)  prochlorperazine (COMPAZINE) tablet 10 mg (has no administration in time range)  risperiDONE (RISPERDAL) tablet 1 mg (has no administration in time range)  0.9 %  sodium chloride infusion (has no administration in time range)  acetaminophen (TYLENOL) tablet 650 mg (has no administration in time range)    Or  acetaminophen (TYLENOL) suppository 650 mg (  has no administration in time range)  docusate sodium (COLACE) capsule 100 mg (has no administration in time range)  polyethylene glycol (MIRALAX / GLYCOLAX) packet 17 g (has no administration in time range)  ondansetron (ZOFRAN) tablet 4 mg (has no administration in time range)    Or  ondansetron (ZOFRAN) injection 4 mg (has no administration in time range)  dexamethasone (DECADRON) injection 4 mg (has no administration in time range)  ceFEPIme (MAXIPIME) 2 g in sodium chloride 0.9 % 100 mL IVPB (0 g Intravenous Stopped 08/17/17 1606)  sodium chloride 0.9 % bolus 1,000 mL (0 mLs Intravenous Stopped 08/17/17 1606)    And  sodium chloride 0.9 % bolus 1,000 mL (0 mLs Intravenous Stopped 08/17/17 1606)     Mobility manual wheelchair

## 2017-08-17 NOTE — ED Notes (Signed)
I gave critical I Stat CG4 results to MD [facility]

## 2017-08-17 NOTE — ED Notes (Signed)
Bed: CL27 Expected date:  Expected time:  Means of arrival:  Comments: 67 yo hypotension

## 2017-08-17 NOTE — ED Notes (Signed)
ED Provider at bedside. 

## 2017-08-17 NOTE — ED Provider Notes (Signed)
[facility] DEPT Provider Note   CSN: [id number] Arrival date & time: 08/17/17  1305     History   Chief Complaint Chief Complaint  Patient presents with  . Hypotension    HPI ADIANA SMELCER is a 67 y.o. female.  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    Home health was working with patient today, and noted she had a low blood pressure and high heart rate and called 911 for transport to the emergency department.  Patient reports that she is had generalized weakness and fatigue.  She denies any other localizing symptoms.  Reports that she has been eating and drinking well.  Denies chest pain, shortness of breath, abdominal pain, vomiting, diarrhea, black or bloody stools, fevers, cough, or dysuria.  She was recently admitted and discharged with treatment for urinary tract infection.   Past Medical History:  Diagnosis Date  . Endometrial cancer ([place])   . Hemorrhoid   . Hypertension   . Paranoid schizophrenia ([place])   . Snake bite     Patient Active Problem List   Diagnosis Date Noted  . Protein-calorie malnutrition, severe 08/13/2017  . UTI (urinary tract infection) 08/11/2017  . Hypomagnesemia 06/23/2017  . Acute prerenal failure ([place]) 06/23/2017  . Left femoral vein DVT ([place]) 06/20/2017  . Lung nodule < 6cm on CT 06/06/2017  . Hypokalemia 06/06/2017  . Nausea with vomiting 06/06/2017  . Delusions ([place])   . Goals of care, counseling/discussion 03/12/2017  . Cancer associated pain 03/12/2017  . Protein-calorie malnutrition, moderate ([place]) 03/03/2017  . Schizoaffective disorder, depressive type ([place]) 02/05/2017  . Peripheral neuropathy due to chemotherapy ([place]) 12/09/2016  . Elevated liver enzymes 09/06/2016  . Anemia, chronic disease 09/06/2016  .  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 Bipolar 1 disorder, mixed, severe ([place]) 08/07/2012  . Cyst of soft tissue 08/07/2012    Past Surgical History:  Procedure Laterality Date  . ENDOMETRIAL BIOPSY  08/11/2016  . IR FLUORO GUIDE PORT INSERTION RIGHT  09/05/2016  . IR US GUIDE VASC ACCESS RIGHT  09/05/2016  . TUBAL LIGATION       OB History    Gravida  1   Para  1   Term      Preterm      AB      Living  1     SAB      TAB      Ectopic      Multiple      Live Births  1            Home Medications    Prior to Admission medications   Medication Sig Start Date End Date Taking? Authorizing Provider  dexamethasone (DECADRON) 4 MG tablet Take 1 tablet (4 mg total) by mouth 2 (two) times daily with a meal. 07/11/17   Heath Lark, MD  divalproex (DEPAKOTE ER) 500 MG 24 hr tablet Take 500 mg by mouth at bedtime.    [provider]  feeding supplement, ENSURE ENLIVE, (ENSURE ENLIVE) LIQD Take 237 mLs by mouth 2 (two) times daily between meals. 08/14/17   Lavina Hamman, MD  HYDROcodone-acetaminophen [facility]) 10-325 MG tablet Take 1 tablet by mouth every 4 (four) hours as needed. Patient taking differently: Take 1 tablet by mouth every 4 (four) hours as needed for moderate pain or severe pain.  08/14/17   Lavina Hamman, MD  magnesium oxide (MAG-OX) 400 (241.3 Mg) MG tablet Take 1 tablet (400 mg total) by mouth 2 (two) times daily. 06/23/17   Heath Lark, MD  ondansetron (ZOFRAN) 8 MG tablet Take 1 tablet (8 mg total) by mouth every 8 (eight) hours as needed for nausea. 04/10/17   Heath Lark, MD  potassium chloride SA (K-DUR,KLOR-CON) 10 MEQ tablet Take 1 tablet (10 mEq total) by mouth daily. 08/14/17   Lavina Hamman, MD  prochlorperazine (COMPAZINE) 10 MG tablet Take 1 tablet (10 mg total) by mouth every 6 (six) hours as needed for nausea or vomiting. 04/10/17   Heath Lark, MD  risperiDONE (RISPERDAL) 1 MG tablet Take 1 mg by mouth daily. 05/13/17    [provider]  rivaroxaban (XARELTO) 20 MG TABS tablet Take 1 tablet (20 mg total) by mouth daily with supper. 08/14/17   Lavina Hamman, MD    Family History Family History  Problem Relation Age of Onset  . Colon cancer Mother        late 33s  . Schizophrenia Sister   . Colon cancer Sister        late 40 s    Social History Social History   Tobacco Use  . Smoking status: Never Smoker  . Smokeless tobacco: Never Used  Substance Use Topics  . Alcohol use: No  . Drug use: No     Allergies   Patient has no known allergies.   Review of Systems Review of Systems  Constitutional: Positive for fatigue. Negative for appetite change and fever.  HENT: Negative for sore throat.   Eyes: Negative for visual disturbance.  Respiratory: Negative for cough and shortness of breath.   Cardiovascular: Negative for chest pain.  Gastrointestinal: Negative for abdominal pain, nausea and vomiting.  Genitourinary: Negative for difficulty urinating and dysuria.  Musculoskeletal: Negative for back pain and neck pain.  Skin: Negative for rash.  Neurological: Positive for light-headedness. Negative for syncope, facial asymmetry and headaches.     Physical Exam Updated Vital Signs BP 114/68   Pulse (!) 103   Temp (!) 97.2 F (36.2 C) (Oral)   Resp (!) 21   Ht 5\' 4"  (1.626 m)   Wt 63.5 kg (140 lb)   SpO2 94%   BMI 24.03 kg/m   Physical Exam  Constitutional: She is oriented to person, place, and time. She appears well-developed and well-nourished. She appears ill. No distress.  HENT:  Head: Normocephalic and atraumatic.  Eyes: Conjunctivae and EOM are normal.  Neck: Normal range of motion.  Cardiovascular: Regular rhythm, normal heart sounds and intact distal pulses. Tachycardia present. Exam reveals no gallop and no friction rub.  No murmur heard. Pulmonary/Chest: Effort normal and breath sounds normal. No respiratory distress. She has no wheezes. She has no rales.    Abdominal: Soft. She exhibits no distension. There is no tenderness. There is no guarding.  Musculoskeletal: She exhibits no edema or tenderness.  Neurological: She is alert and oriented to person, place, and time.  Skin: Skin is warm and dry. No rash noted. She is not diaphoretic. No erythema.  Nursing note and vitals reviewed.    ED Treatments / Results  Labs (all labs ordered are listed, but only abnormal results are displayed) Labs Reviewed  COMPREHENSIVE METABOLIC PANEL - Abnormal; Notable for the following components:      Result Value   Chloride 98 (*)    Glucose, Bld 100 (*)  BUN 31 (*)    Creatinine, Ser 1.39 (*)    Calcium 8.6 (*)    Total Protein 5.9 (*)    Albumin 1.8 (*)    GFR calc non Af Amer 38 (*)    GFR calc Af Amer 44 (*)    All other components within normal limits  CBC WITH DIFFERENTIAL/PLATELET - Abnormal; Notable for the following components:   WBC 12.1 (*)    RBC 3.19 (*)    Hemoglobin 9.1 (*)    HCT 28.7 (*)    RDW 16.2 (*)    Lymphs Abs 4.2 (*)    All other components within normal limits  PROTIME-INR - Abnormal; Notable for the following components:   Prothrombin Time 15.6 (*)    All other components within normal limits  I-STAT CG4 LACTIC ACID, ED - Abnormal; Notable for the following components:   Lactic Acid, Venous 2.84 (*)    All other components within normal limits  CULTURE, BLOOD (ROUTINE X 2)  CULTURE, BLOOD (ROUTINE X 2)  PATHOLOGIST SMEAR REVIEW  I-STAT TROPONIN, ED  I-STAT CG4 LACTIC ACID, ED    EKG EKG Interpretation  Date/Time:  "Sunday August 17 2017 13:47:29 EDT Ventricular Rate:  120 PR Interval:    QRS Duration: 81 QT Interval:  358 QTC Calculation: 506 R Axis:   3 Text Interpretation:  Sinus tachycardia Abnormal R-wave progression, early transition Nonspecific repol abnormality, diffuse leads Prolonged QT interval No significant change since last tracing Confirmed by ,  ([id number]) on 08/17/2017 4:15:41  PM   Radiology Dg Chest 2 View  Result Date: 08/17/2017 CLINICAL DATA:  Hypotension today. Undergoing chemotherapy for endometrial cancer. EXAM: CHEST - 2 VIEW COMPARISON:  08/11/2017 FINDINGS: Right IJ Port-A-Cath is present with tip over the right atrium unchanged. Patient is rotated to the left. Lungs are adequately inflated with hazy opacification over the right mid to lower lung slightly worse compatible with moderate size effusion with associated atelectasis. Slight worsened left base opacification compatible with small effusion with atelectasis. Cardiomediastinal silhouette and remainder the exam is unchanged. IMPRESSION: Worsening moderate size right effusion and worsening small left effusion likely with associated bibasilar atelectasis. Right IJ Port-A-Cath unchanged with tip over the right atrium. Electronically Signed   By: Daniel  Boyle M.D.   On: 08/17/2017 16:06    Procedures .Critical Care Performed by: , , MD Authorized by: , , MD   Critical care provider statement:    Critical care time (minutes):  30   Critical care was time spent personally by me on the following activities:  Ordering and review of radiographic studies, ordering and review of laboratory studies, re-evaluation of patient's condition, examination of patient, discussions with consultants, review of old charts and pulse oximetry   (including critical care time)  Medications Ordered in ED Medications  ceFEPIme (MAXIPIME) 2 g in sodium chloride 0.9 % 100 mL IVPB (0 g Intravenous Stopped 08/17/17 1606)  sodium chloride 0.9 % bolus 1,000 mL (0 mLs Intravenous Stopped 08/17/17 1606)    And  sodium chloride 0.9 % bolus 1,000 mL (0 mLs Intravenous Stopped 08/17/17 1606)     Initial Impression / Assessment and Plan / ED Course  I have reviewed the triage vital signs and the nursing notes.  Pertinent labs & imaging results that were available during my care of the patient were reviewed  by me and considered in my medical decision making (see chart for details).      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    Home health was working with patient today, and noted she had a low blood pressure and high heart rate and called 911 for transport to the emergency department.  Patient with blood pressures in 02R-42H systolic on arrival with tachycardia to 120s.  On anticoagulation, no CP or dyspnea, doubt PE> No symptoms to suggest acute GI bleed. Does not have history that suggests dehydration, reports she has been eating well.  Concern given leukocytosis, recent infection, hypotension, tachycardia and lactic acidosis for sepsis as possible etiology of symptoms.  Given 30cc/kg normal saline and cefepime for suspected urinary source in setting of recent UTI. UA is pending, however.    Vital signs improved after IV fluids, abx.  Admitted to hospitalist for further care.  Final Clinical Impressions(s) / ED Diagnoses   Final diagnoses:  Lactic acidosis  Hypotension, unspecified hypotension type  Tachycardia  Leukocytosis, unspecified type    ED Discharge Orders    None       Gareth Morgan, MD 08/17/17 1659

## 2017-08-17 NOTE — Progress Notes (Signed)
ANTICOAGULATION CONSULT NOTE - Initial Consult  Pharmacy Consult for Rivaroxaban Indication: history of DVT  No Known Allergies  Patient Measurements: Height: 5\' 4"  (162.6 cm) Weight: 140 lb (63.5 kg) IBW/kg (Calculated) : 54.7  Vital Signs: Temp: 97.2 F (36.2 C) (04/28 1325) Temp Source: Oral (04/28 1325) BP: 101/63 (04/28 1715) Pulse Rate: 105 (04/28 1715)  Labs: Recent Labs    08/15/17 0811 08/15/17 2207 08/17/17 1350  HGB 7.8* 8.7* 9.1*  HCT 24.1* 27.0* 28.7*  PLT 218 320 324  LABPROT  --   --  15.6*  INR  --   --  1.25  CREATININE 1.40* 1.52* 1.39*    Estimated Creatinine Clearance: 33.4 mL/min (A) (by C-G formula based on SCr of 1.39 mg/dL (H)).   Medical History: Past Medical History:  Diagnosis Date  . Endometrial cancer ([place])   . Hemorrhoid   . Hypertension   . Paranoid schizophrenia ([place])   . Snake bite      Assessment: 40 y/oF on rivaroxaban PTA for history of left femoral vein DVT (06/20/2017). Per chart review, son reported bleeding issues on 07/15/2017, and patient was transfused 2 units PRBCs at visit with Dr. Alvy Bimler on 07/18/2017. Patient was continued on rivaroxaban at that time. Patient was seen again by Dr. Alvy Bimler on 08/01/2017. Patient noted to have minor bleeding that day, and MD notes indicate to "continue Xarelto at reduced dose." Patient admitted to [facility] 08/11/2017-08/15/2017 and was placed on rivaroxaban 20mg  daily, and she was discharged on same dose. Patient admitted today and pharmacy consulted for dosing of medication.  SCr 1.39 with CrCl ~ 39 ml/min CBC: Hgb 9.1, Pltc WNL   Plan:  Discussed dosing with Dr. Posey Pronto. Will continue rivaroxaban 20mg  PO daily with supper for now (appropriate dosing for treatment of DVT). TRH will contact Dr. Alvy Bimler in AM to discuss need for dose adjustment.  Monitor renal function, CBC, and for s/sx of bleeding.   Lindell Spar, PharmD, BCPS Pager: [phone] 08/17/2017 7:01 PM

## 2017-08-17 NOTE — Progress Notes (Signed)
A consult was received from an ED physician for cefepime per pharmacy dosing.  The patient's profile has been reviewed for ht/wt/allergies/indication/available labs.   A one time order has been placed for cefepime 2g.    Further antibiotics/pharmacy consults should be ordered by admitting physician if indicated.                       Thank you, Peggyann Juba, PharmD, BCPS 08/17/2017  2:10 PM

## 2017-08-17 NOTE — ED Triage Notes (Signed)
Pt comes from home. Pt brought in via [facility]. Pt home health nurse called 911 due to pt's low BP. Pt has no complaints. Pt is has been treated recently for UTI. No problems with Urinary. Pt is AOx4. Pt does not ambulate due to ankle injury.

## 2017-08-17 NOTE — H&P (Signed)
[facility] History and Physical   Patient: Emily Duarte JOA:[id number]   PCP: Nolene Ebbs, MD DOB: 1950/08/11   DOA: 08/17/2017   DOS: 08/17/2017   DOS: the patient was seen and examined on 08/17/2017  Patient coming from: The patient is coming from home.  Chief Complaint: hypotension  HPI: Emily Duarte is a 67 y.o. female with Past medical history of endometrial cancer, hypertension, schizophrenia, left arm DVT. Patient was brought in by EMS.  Home health nurse arrived at patient's home today, found that her blood pressure was in 70s and her heart rate was 122 and recommended that the patient should be sent to ER for further evaluation. Discussed with patient's son on the phone as well as patient.  There was no report of fever, chills, chest pain, abdominal pain, diarrhea, nausea, vomiting.  Patient denies any burning urination or blood in the urine. Son reported that after going home from her recent hospitalization patient was acting delusional and also had hallucination (she was seeing things that were not there). Son reports that the patient has been emptying her pillbox but he has not verified whether the patient is taking this medications are not. Patient was recently hospitalized for UTI was treated with oral antibiotics and plan was to go to SNF on Monday for therapy. Patient was seen in the ER on 08/16/2017 for psych evaluation as the patient reported that she does not want to continue chemotherapy going forward.  Although at the time of my evaluation patient wants to continue with chemotherapy if possible.  ED Course: Presented with blood pressure in 90s, heart rate in 110.  Lactic acid 2.9.  Patient was given IV hydration with 2 L of normal saline bolus.  Given IV vancomycin.  Blood cultures performed urine analysis performed.  Chest x-ray was performed and patient was referred for admission.  At her baseline ambulates with support And is dependent for most of  her ADL; does not manages her medication on her own.  Review of Systems: as mentioned in the history of present illness.  All other systems reviewed and are negative.  Past Medical History:  Diagnosis Date  . Endometrial cancer ([place])   . Hemorrhoid   . Hypertension   . Paranoid schizophrenia ([place])   . Snake bite    Past Surgical History:  Procedure Laterality Date  . ENDOMETRIAL BIOPSY  08/11/2016  . IR FLUORO GUIDE PORT INSERTION RIGHT  09/05/2016  . IR US GUIDE VASC ACCESS RIGHT  09/05/2016  . TUBAL LIGATION     Social History:  reports that she has never smoked. She has never used smokeless tobacco. She reports that she does not drink alcohol or use drugs.  No Known Allergies  Family History  Problem Relation Age of Onset  . Colon cancer Mother        late 44s  . Schizophrenia Sister   . Colon cancer Sister        late 49 s     Prior to Admission medications   Medication Sig Start Date End Date Taking? Authorizing Provider  dexamethasone (DECADRON) 4 MG tablet Take 1 tablet (4 mg total) by mouth 2 (two) times daily with a meal. 07/11/17  Yes Gorsuch, Ni, MD  divalproex (DEPAKOTE ER) 500 MG 24 hr tablet Take 500 mg by mouth at bedtime.   Yes [provider]  HYDROcodone-acetaminophen (NORCO) 10-325 MG tablet Take 1 tablet by mouth every 4 (four) hours as needed. Patient taking differently: Take  1 tablet by mouth every 4 (four) hours as needed for moderate pain or severe pain.  08/14/17  Yes Lavina Hamman, MD  magnesium oxide (MAG-OX) 400 (241.3 Mg) MG tablet Take 1 tablet (400 mg total) by mouth 2 (two) times daily. 06/23/17  Yes Gorsuch, Ni, MD  potassium chloride SA (K-DUR,KLOR-CON) 10 MEQ tablet Take 1 tablet (10 mEq total) by mouth daily. 08/14/17  Yes Lavina Hamman, MD  risperiDONE (RISPERDAL) 1 MG tablet Take 1 mg by mouth daily. 05/13/17  Yes [provider]  ondansetron (ZOFRAN) 8 MG tablet Take 1 tablet (8 mg total) by mouth every 8 (eight) hours as  needed for nausea. 04/10/17   Heath Lark, MD  prochlorperazine (COMPAZINE) 10 MG tablet Take 1 tablet (10 mg total) by mouth every 6 (six) hours as needed for nausea or vomiting. 04/10/17   Heath Lark, MD  rivaroxaban (XARELTO) 20 MG TABS tablet Take 1 tablet (20 mg total) by mouth daily with supper. 08/14/17   Lavina Hamman, MD    Physical Exam: Vitals:   08/17/17 1645 08/17/17 1648 08/17/17 1700 08/17/17 1715  BP: 101/63 101/63 104/68 101/63  Pulse: (!) 104 (!) 103 (!) 107 (!) 105  Resp: 18 17 (!) 22 19  Temp:      TempSrc:      SpO2: 94% 94% 95% 95%  Weight:      Height:        General: Alert, Awake and Oriented to Time, Place and Person. Appear in moderate distress, affect blunted Eyes: PERRL, Conjunctiva normal ENT: Oral Mucosa clear dry. Neck: no JVD, no Abnormal Mass Or lumps Cardiovascular: S1 and S2 Present, no Murmur, Peripheral Pulses Present Respiratory: normal respiratory effort, Bilateral Air entry equal and Decreased, no use of accessory muscle, Clear to Auscultation, no Crackles, no wheezes Abdomen: Bowel Sound present, Soft and no tenderness, no hernia Skin: no redness, no Rash, no induration Extremities: trace Pedal edema, no calf tenderness Neurologic: Grossly no focal neuro deficit. Bilaterally Equal motor strength  Labs on Admission:  CBC: Recent Labs  Lab 08/11/17 1118  08/13/17 0416 08/13/17 1025 08/14/17 1207 08/15/17 0811 08/15/17 2207 08/17/17 1350  WBC 2.9*   < > QUESTIONABLE RESULTS, RECOMMEND RECOLLECT TO VERIFY 2.0* 4.3 5.4 8.4 12.1*  NEUTROABS 0.9*  --  QUESTIONABLE RESULTS, RECOMMEND RECOLLECT TO VERIFY 0.5*  --   --   --  7.1  HGB 9.1*   < > QUESTIONABLE RESULTS, RECOMMEND RECOLLECT TO VERIFY 7.7* 8.7* 7.8* 8.7* 9.1*  HCT 28.4*   < > QUESTIONABLE RESULTS, RECOMMEND RECOLLECT TO VERIFY 23.5* 27.4* 24.1* 27.0* 28.7*  MCV 88.8   < > QUESTIONABLE RESULTS, RECOMMEND RECOLLECT TO VERIFY 89.0 88.4 88.0 87.7 90.0  PLT 153   < > QUESTIONABLE  RESULTS, RECOMMEND RECOLLECT TO VERIFY 146* 256 218 320 324   < > = values in this interval not displayed.   Basic Metabolic Panel: Recent Labs  Lab 08/11/17 1118 08/12/17 0811 08/13/17 0416 08/13/17 1025 08/14/17 1207 08/15/17 0811 08/15/17 2207 08/17/17 1350  NA 140 140 QUESTIONABLE RESULTS, RECOMMEND RECOLLECT TO VERIFY 137 140 138 135 138  K 3.4* 4.4 QUESTIONABLE RESULTS, RECOMMEND RECOLLECT TO VERIFY 4.1 3.8 3.4* 4.4 3.7  CL 101 107 QUESTIONABLE RESULTS, RECOMMEND RECOLLECT TO VERIFY 106 104 103 97* 98*  CO2 26 23 QUESTIONABLE RESULTS, RECOMMEND RECOLLECT TO VERIFY 22 24 24 23 28   GLUCOSE 106* 114* QUESTIONABLE RESULTS, RECOMMEND RECOLLECT TO VERIFY 117* 116* 94 120* 100*  BUN  32* 29* QUESTIONABLE RESULTS, RECOMMEND RECOLLECT TO VERIFY 34* 33* 32* 34* 31*  CREATININE 1.60* 1.51* QUESTIONABLE RESULTS, RECOMMEND RECOLLECT TO VERIFY 1.35* 1.47* 1.40* 1.52* 1.39*  CALCIUM 8.2* 8.0* QUESTIONABLE RESULTS, RECOMMEND RECOLLECT TO VERIFY 7.9* 8.7* 8.0* 8.4* 8.6*  MG 1.6* 1.5* QUESTIONABLE RESULTS, RECOMMEND RECOLLECT TO VERIFY 1.5*  --   --   --   --    GFR: Estimated Creatinine Clearance: 33.4 mL/min (A) (by C-G formula based on SCr of 1.39 mg/dL (H)). Liver Function Tests: Recent Labs  Lab 08/11/17 1118 08/15/17 2207 08/17/17 1350  AST 20 37 32  ALT 14 22 19   ALKPHOS 122 127* 114  BILITOT 0.9 0.1* 0.3  PROT 6.1* 5.8* 5.9*  ALBUMIN 2.0* 2.0* 1.8*   Recent Labs  Lab 08/11/17 1118  LIPASE 19   No results for input(s): AMMONIA in the last 168 hours. Coagulation Profile: Recent Labs  Lab 08/17/17 1350  INR 1.25   Cardiac Enzymes: Recent Labs  Lab 08/11/17 1118  TROPONINI 0.05*   BNP (last 3 results) No results for input(s): PROBNP in the last 8760 hours. HbA1C: No results for input(s): HGBA1C in the last 72 hours. CBG: No results for input(s): GLUCAP in the last 168 hours. Lipid Profile: No results for input(s): CHOL, HDL, LDLCALC, TRIG, CHOLHDL, LDLDIRECT in  the last 72 hours. Thyroid Function Tests: No results for input(s): TSH, T4TOTAL, FREET4, T3FREE, THYROIDAB in the last 72 hours. Anemia Panel: No results for input(s): VITAMINB12, FOLATE, FERRITIN, TIBC, IRON, RETICCTPCT in the last 72 hours. Urine analysis:    Component Value Date/Time   COLORURINE YELLOW 08/17/2017 1700   APPEARANCEUR CLEAR 08/17/2017 1700   LABSPEC 1.014 08/17/2017 1700   PHURINE 5.0 08/17/2017 1700   GLUCOSEU NEGATIVE 08/17/2017 1700   HGBUR NEGATIVE 08/17/2017 1700   BILIRUBINUR NEGATIVE 08/17/2017 1700   KETONESUR NEGATIVE 08/17/2017 1700   PROTEINUR NEGATIVE 08/17/2017 1700   UROBILINOGEN 0.2 07/12/2009 1846   NITRITE NEGATIVE 08/17/2017 1700   LEUKOCYTESUR NEGATIVE 08/17/2017 1700    Radiological Exams on Admission: Dg Chest 2 View  Result Date: 08/17/2017 CLINICAL DATA:  Hypotension today. Undergoing chemotherapy for endometrial cancer. EXAM: CHEST - 2 VIEW COMPARISON:  08/11/2017 FINDINGS: Right IJ Port-A-Cath is present with tip over the right atrium unchanged. Patient is rotated to the left. Lungs are adequately inflated with hazy opacification over the right mid to lower lung slightly worse compatible with moderate size effusion with associated atelectasis. Slight worsened left base opacification compatible with small effusion with atelectasis. Cardiomediastinal silhouette and remainder the exam is unchanged. IMPRESSION: Worsening moderate size right effusion and worsening small left effusion likely with associated bibasilar atelectasis. Right IJ Port-A-Cath unchanged with tip over the right atrium. Electronically Signed   By: Marin Olp M.D.   On: 08/17/2017 16:06   Dg Abd Portable 1v  Result Date: 08/17/2017 CLINICAL DATA:  Abdominal distention. EXAM: PORTABLE ABDOMEN - 1 VIEW COMPARISON:  CT abdomen pelvis dated June 20, 2017. FINDINGS: The bowel gas pattern is normal. Moderate colonic stool burden, unchanged. No radio-opaque calculi or other  significant radiographic abnormality are seen. Multiple phleboliths in the pelvis. No acute osseous abnormality. IMPRESSION: 1. Prominent stool throughout the colon favors constipation. No obstruction. Electronically Signed   By: Titus Dubin M.D.   On: 08/17/2017 17:44   EKG: Independently reviewed. normal sinus rhythm, prolonged QT interval.  Assessment/Plan 1.  SIRS Dehydration ?  Secondary adrenal insufficiency. Patient presents with hypotension and tachycardia.  No evidence of active  infection based on the current available information of chest x-ray as well as UA. Clinical examination also unremarkable. Lactic acid was elevated likely from dehydration. Improved with IV hydration. Patient's hypotension can also be because of secondary adrenal insufficiency as the patient was on chronic steroids and her compliance is questionable given her history of schizophrenia. At present we will continue IV hydration for the patient. Check cortisol level in the morning. Give IV Decadron x1.  2.  History of schizophrenia with paranoid behavior. History of delusion and hallucination. We will get psychiatry to evaluate the patient. Patient was treated with Depakote twice daily and risperidone 0.5 mg nightly and currently on Depakote daily only with undetectable Depakote level last admission. I question patient's compliance and therefore will look for different treatment regimen from psychiatry for managing patients schizophrenia.  3.  Constipation. X-ray abdomen shows significant constipation continue stool softeners for now.  4.  Left leg DVT. Patient is on Xarelto. There is been a question about reducing the patient to 15 mg daily dose although it is not recommended for treatment for DVT. We discussed with oncology tomorrow and currently continue with 20 mg daily Xarelto.  5.  Anemia of the chronic disease worsening due to chemotherapy. Monitor H&H. No active bleeding reported last  admission.  6.  chronic kidney disease stage III. Renal function stable currently close to her baseline. Although patient clinically dehydrated. Continue IV hydration.  7.  Bilateral pleural effusion. Chest x-ray shows bilateral pleural effusion progressively worsening although on my evaluation the axilla appears to be stable. This pleural effusion has been present since January. Unsure of etiology but currently patient does not appear to be in any distress. Monitor.  Nutrition: regular diet DVT Prophylaxis: on therapeutic anticoagulation.  Advance goals of care discussion: full code   Consults: psych, oncology  Family Communication: no family was present at bedside, at the time of interview.  Called son on phone, Opportunity was given to ask question and all questions were answered satisfactorily.  Disposition: Admitted as observation, med-surge unit. Likely to be discharged SNF, in 1-2 days.  Author: Berle Mull, MD [facility] 08/17/2017  If 7PM-7AM, please contact night-coverage [email or web address] Password [email or web address]

## 2017-08-18 DIAGNOSIS — E872 Acidosis: Secondary | ICD-10-CM | POA: Diagnosis present

## 2017-08-18 DIAGNOSIS — R450 Nervousness: Secondary | ICD-10-CM

## 2017-08-18 DIAGNOSIS — D638 Anemia in other chronic diseases classified elsewhere: Secondary | ICD-10-CM | POA: Diagnosis present

## 2017-08-18 DIAGNOSIS — Z9114 Patient's other noncompliance with medication regimen: Secondary | ICD-10-CM

## 2017-08-18 DIAGNOSIS — F22 Delusional disorders: Secondary | ICD-10-CM

## 2017-08-18 DIAGNOSIS — Z7952 Long term (current) use of systemic steroids: Secondary | ICD-10-CM | POA: Diagnosis not present

## 2017-08-18 DIAGNOSIS — Z86718 Personal history of other venous thrombosis and embolism: Secondary | ICD-10-CM | POA: Diagnosis not present

## 2017-08-18 DIAGNOSIS — Z818 Family history of other mental and behavioral disorders: Secondary | ICD-10-CM | POA: Diagnosis not present

## 2017-08-18 DIAGNOSIS — N183 Chronic kidney disease, stage 3 (moderate): Secondary | ICD-10-CM | POA: Diagnosis present

## 2017-08-18 DIAGNOSIS — R339 Retention of urine, unspecified: Secondary | ICD-10-CM | POA: Diagnosis present

## 2017-08-18 DIAGNOSIS — I129 Hypertensive chronic kidney disease with stage 1 through stage 4 chronic kidney disease, or unspecified chronic kidney disease: Secondary | ICD-10-CM | POA: Diagnosis present

## 2017-08-18 DIAGNOSIS — Z560 Unemployment, unspecified: Secondary | ICD-10-CM

## 2017-08-18 DIAGNOSIS — C541 Malignant neoplasm of endometrium: Secondary | ICD-10-CM | POA: Diagnosis present

## 2017-08-18 DIAGNOSIS — F419 Anxiety disorder, unspecified: Secondary | ICD-10-CM | POA: Diagnosis not present

## 2017-08-18 DIAGNOSIS — K5900 Constipation, unspecified: Secondary | ICD-10-CM | POA: Diagnosis present

## 2017-08-18 DIAGNOSIS — F209 Schizophrenia, unspecified: Secondary | ICD-10-CM

## 2017-08-18 DIAGNOSIS — Z7901 Long term (current) use of anticoagulants: Secondary | ICD-10-CM | POA: Diagnosis not present

## 2017-08-18 DIAGNOSIS — D6481 Anemia due to antineoplastic chemotherapy: Secondary | ICD-10-CM | POA: Diagnosis present

## 2017-08-18 DIAGNOSIS — I959 Hypotension, unspecified: Secondary | ICD-10-CM | POA: Diagnosis present

## 2017-08-18 DIAGNOSIS — E2749 Other adrenocortical insufficiency: Secondary | ICD-10-CM | POA: Diagnosis present

## 2017-08-18 DIAGNOSIS — T451X5A Adverse effect of antineoplastic and immunosuppressive drugs, initial encounter: Secondary | ICD-10-CM | POA: Diagnosis present

## 2017-08-18 DIAGNOSIS — J90 Pleural effusion, not elsewhere classified: Secondary | ICD-10-CM | POA: Diagnosis present

## 2017-08-18 DIAGNOSIS — R6510 Systemic inflammatory response syndrome (SIRS) of non-infectious origin without acute organ dysfunction: Secondary | ICD-10-CM | POA: Diagnosis present

## 2017-08-18 DIAGNOSIS — F319 Bipolar disorder, unspecified: Secondary | ICD-10-CM | POA: Diagnosis present

## 2017-08-18 DIAGNOSIS — Z79899 Other long term (current) drug therapy: Secondary | ICD-10-CM | POA: Diagnosis not present

## 2017-08-18 DIAGNOSIS — I82402 Acute embolism and thrombosis of unspecified deep veins of left lower extremity: Secondary | ICD-10-CM | POA: Diagnosis present

## 2017-08-18 DIAGNOSIS — E8809 Other disorders of plasma-protein metabolism, not elsewhere classified: Secondary | ICD-10-CM | POA: Diagnosis present

## 2017-08-18 DIAGNOSIS — E43 Unspecified severe protein-calorie malnutrition: Secondary | ICD-10-CM | POA: Diagnosis present

## 2017-08-18 DIAGNOSIS — E860 Dehydration: Secondary | ICD-10-CM | POA: Diagnosis present

## 2017-08-18 DIAGNOSIS — F200 Paranoid schizophrenia: Secondary | ICD-10-CM | POA: Diagnosis present

## 2017-08-18 DIAGNOSIS — Z6827 Body mass index (BMI) 27.0-27.9, adult: Secondary | ICD-10-CM | POA: Diagnosis not present

## 2017-08-18 DIAGNOSIS — F251 Schizoaffective disorder, depressive type: Secondary | ICD-10-CM | POA: Diagnosis not present

## 2017-08-18 LAB — BASIC METABOLIC PANEL
Anion gap: 9 (ref 5–15)
BUN: 26 mg/dL — AB (ref 6–20)
CO2: 24 mmol/L (ref 22–32)
Calcium: 7.8 mg/dL — ABNORMAL LOW (ref 8.9–10.3)
Chloride: 107 mmol/L (ref 101–111)
Creatinine, Ser: 1.22 mg/dL — ABNORMAL HIGH (ref 0.44–1.00)
GFR calc Af Amer: 52 mL/min — ABNORMAL LOW (ref >60)
GFR calc non Af Amer: 44 mL/min — ABNORMAL LOW (ref >60)
GLUCOSE: 140 mg/dL — AB (ref 65–99)
Potassium: 4 mmol/L (ref 3.5–5.1)
Sodium: 140 mmol/L (ref 135–145)

## 2017-08-18 LAB — HEPATIC FUNCTION PANEL
ALBUMIN: 1.6 g/dL — AB (ref 3.5–5.0)
ALK PHOS: 100 U/L (ref 38–126)
ALT: 19 U/L (ref 14–54)
AST: 34 U/L (ref 15–41)
Bilirubin, Direct: <0.1 mg/dL — ABNORMAL LOW (ref 0.1–0.5)
TOTAL PROTEIN: 5.4 g/dL — AB (ref 6.5–8.1)
Total Bilirubin: 0.3 mg/dL (ref 0.3–1.2)

## 2017-08-18 LAB — CBC WITH DIFFERENTIAL/PLATELET
BASOS ABS: 0 10*3/uL (ref 0.0–0.1)
BASOS PCT: 0 %
EOS ABS: 0 10*3/uL (ref 0.0–0.7)
EOS PCT: 0 %
HCT: 22.6 % — ABNORMAL LOW (ref 36.0–46.0)
HEMOGLOBIN: 7.2 g/dL — AB (ref 12.0–15.0)
Lymphocytes Relative: 6 %
Lymphs Abs: 0.8 10*3/uL (ref 0.7–4.0)
MCH: 28.8 pg (ref 26.0–34.0)
MCHC: 31.9 g/dL (ref 30.0–36.0)
MCV: 90.4 fL (ref 78.0–100.0)
METAMYELOCYTES PCT: 8 %
MONOS PCT: 3 %
MYELOCYTES: 23 %
Monocytes Absolute: 0.4 10*3/uL (ref 0.1–1.0)
Neutro Abs: 12.2 10*3/uL — ABNORMAL HIGH (ref 1.7–7.7)
Neutrophils Relative %: 57 %
Other: 1 %
Platelets: 267 10*3/uL (ref 150–400)
Promyelocytes Relative: 2 %
RBC: 2.5 MIL/uL — ABNORMAL LOW (ref 3.87–5.11)
RDW: 16.8 % — ABNORMAL HIGH (ref 11.5–15.5)
WBC: 13.5 10*3/uL — ABNORMAL HIGH (ref 4.0–10.5)

## 2017-08-18 LAB — CORTISOL-AM, BLOOD: CORTISOL - AM: 4.8 ug/dL — AB (ref 6.7–22.6)

## 2017-08-18 LAB — PATHOLOGIST SMEAR REVIEW

## 2017-08-18 MED ORDER — SODIUM CHLORIDE 0.9% FLUSH
10.0000 mL | INTRAVENOUS | Status: DC | PRN
  Administered 2017-08-19: 10 mL
  Filled 2017-08-18: qty 40

## 2017-08-18 NOTE — Consult Note (Addendum)
[facility] Face-to-Face Psychiatry Consult   Reason for Consult:  Delusions and hallucinations  Referring Physician:  Dr. Posey Pronto Patient Identification: Emily Duarte MRN:  [id number] Principal Diagnosis: Schizoaffective disorder, depressive type [facility]) Diagnosis:   Patient Active Problem List   Diagnosis Date Noted  . Dehydration [E86.0] 08/17/2017  . Protein-calorie malnutrition, severe [E43] 08/13/2017  . UTI (urinary tract infection) [N39.0] 08/11/2017  . Hypomagnesemia [E83.42] 06/23/2017  . Acute prerenal failure ([place]) [N17.9] 06/23/2017  . Left femoral vein DVT ([place]) [I82.412] 06/20/2017  . Lung nodule < 6cm on CT [R91.1] 06/06/2017  . Hypokalemia [E87.6] 06/06/2017  . Nausea with vomiting [R11.2] 06/06/2017  . Delusions ([place]) [F22]   . Goals of care, counseling/discussion [Z71.89] 03/12/2017  . 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 Protein-calorie malnutrition, moderate ([place]) [E44.0] 03/03/2017  . Schizoaffective disorder, depressive type ([place]) [F25.1] 02/05/2017  . 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 Elevated liver enzymes [R74.8] 09/06/2016  . Anemia, chronic disease [D63.8] 09/06/2016  . Endometrial/uterine adenocarcinoma ([place]) [C54.1] 08/19/2016  . Bipolar 1 disorder, mixed, severe ([place]) [F31.63] 08/07/2012  . Cyst of soft tissue [M79.89] 08/07/2012    Total Time spent with patient: 1 hour  Subjective:   Emily Duarte is a 67 y.o. female patient admitted with hypotension and tachycardia secondary to dehydration.  HPI:   Per chart review patient has a history of schizoaffective disorder. She is prescribed Depakote 500 mg qhs and Risperdal 1 mg daily. She was last seen at [facility] for psychosis in January. She was started on Risperdal and recommended for inpatient psychiatric hospitalization. She was last hospitalized for UTI from 4/22-4/26 for UTI. She was planned for discharge to a SNF but her son wanted her to  return home. Son reports that she has been delusional and responding to internal stimuli since her prior hospitalization. She has been emptying her pillbox but it is unclear if she has been compliant with her medications. Depakote level was < 10 on 4/22. QTc was 506 on admission.   On interview, Emily Duarte reports improvement of dizziness and weakness since admission. She reports speaking to someone from a local SNF. She appreciates assistance with ambulating and standing. She uses a walker. She reports poor compliance with Depakote and Risperdal. She reports discontinuing her medications when she is doing well since she feels like she no longer needs them. She becomes anxious when she does. She endorses anxiety today. She denies SI, HI or AVH. She denies problems with sleep or appetite.   Past Psychiatric History: Schizophrenia and schizoaffective disorder.   Risk to Self: Is patient at risk for suicide?: No Risk to Others:  None. Denies HI. Prior Inpatient Therapy:  She was hospitalized at [facility] in January for psychosis.  Prior Outpatient Therapy:  She is followed by the ACT team.   Past Medical History:  Past Medical History:  Diagnosis Date  . Endometrial cancer ([place])   . Hemorrhoid   . Hypertension   . Paranoid schizophrenia ([place])   . Snake bite     Past Surgical History:  Procedure Laterality Date  . ENDOMETRIAL BIOPSY  08/11/2016  . IR FLUORO GUIDE PORT INSERTION RIGHT  09/05/2016  . IR US GUIDE VASC ACCESS RIGHT  09/05/2016  . TUBAL LIGATION     Family History:  Family History  Problem Relation Age of Onset  . Colon cancer Mother        late 8s  . Schizophrenia Sister   . Colon  cancer Sister        late 54 s   Family Psychiatric  History: Sister-schizophrenia  Social History:  Social History   Substance and Sexual Activity  Alcohol Use No     Social History   Substance and Sexual Activity  Drug Use No    Social History   Socioeconomic History  .  Marital status: Divorced    Spouse name: Not on file  . Number of children: 1  . Years of education: Not on file  . Highest education level: Not on file  Occupational History  . Occupation: Retired  Scientific laboratory technician  . Financial resource strain: Not on file  . Food insecurity:    Worry: Not on file    Inability: Not on file  . Transportation needs:    Medical: Not on file    Non-medical: Not on file  Tobacco Use  . Smoking status: Never Smoker  . Smokeless tobacco: Never Used  Substance and Sexual Activity  . Alcohol use: No  . Drug use: No  . Sexual activity: Never  Lifestyle  . Physical activity:    Days per week: Not on file    Minutes per session: Not on file  . Stress: Not on file  Relationships  . Social connections:    Talks on phone: Not on file    Gets together: Not on file    Attends religious service: Not on file    Active member of club or organization: Not on file    Attends meetings of clubs or organizations: Not on file    Relationship status: Not on file  Other Topics Concern  . Not on file  Social History Narrative  . Not on file   Additional Social History: She lives at home. She has an adult son and 3 grandchildren. 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 She has been unemployed since 1993 and receives disability. She denies illicit substance or alcohol use.    Allergies:  No Known Allergies  Labs:  Results for orders placed or performed during the hospital encounter of 08/17/17 (from the past 48 hour(s))  Comprehensive metabolic panel     Status: Abnormal   Collection Time: 08/17/17  1:50 PM  Result Value Ref Range   Sodium 138 135 - 145 mmol/L   Potassium 3.7 3.5 - 5.1 mmol/L   Chloride 98 (L) 101 - 111 mmol/L   CO2 28 22 - 32 mmol/L   Glucose, Bld 100 (H) 65 - 99 mg/dL   BUN 31 (H) 6 - 20 mg/dL   Creatinine, Ser 1.39 (H) 0.44 - 1.00 mg/dL   Calcium 8.6 (L) 8.9 - 10.3 mg/dL   Total Protein 5.9 (L) 6.5 - 8.1 g/dL   Albumin 1.8 (L)  3.5 - 5.0 g/dL   AST 32 15 - 41 U/L   ALT 19 14 - 54 U/L   Alkaline Phosphatase 114 38 - 126 U/L   Total Bilirubin 0.3 0.3 - 1.2 mg/dL   GFR calc non Af Amer 38 (L) >60 mL/min   GFR calc Af Amer 44 (L) >60 mL/min    Comment: (NOTE) The eGFR has been calculated using the CKD EPI equation. This calculation has not been validated in all clinical situations. eGFR's persistently <60 mL/min signify possible Chronic Kidney Disease.    Anion gap 12 5 - 15    Comment: Performed at [facility], [place] [address], [place], [place] [address]  CBC with Differential  Status: Abnormal   Collection Time: 08/17/17  1:50 PM  Result Value Ref Range   WBC 12.1 (H) 4.0 - 10.5 K/uL   RBC 3.19 (L) 3.87 - 5.11 MIL/uL   Hemoglobin 9.1 (L) 12.0 - 15.0 g/dL   HCT 28.7 (L) 36.0 - 46.0 %   MCV 90.0 78.0 - 100.0 fL   MCH 28.5 26.0 - 34.0 pg   MCHC 31.7 30.0 - 36.0 g/dL   RDW 16.2 (H) 11.5 - 15.5 %   Platelets 324 150 - 400 K/uL   Neutrophils Relative % 30 %   Lymphocytes Relative 35 %   Monocytes Relative 6 %   Eosinophils Relative 1 %   Basophils Relative 0 %   Band Neutrophils 15 %   Metamyelocytes Relative 4 %   Myelocytes 9 %   Neutro Abs 7.1 1.7 - 7.7 K/uL   Lymphs Abs 4.2 (H) 0.7 - 4.0 K/uL   Monocytes Absolute 0.7 0.1 - 1.0 K/uL   Eosinophils Absolute 0.1 0.0 - 0.7 K/uL   Basophils Absolute 0.0 0.0 - 0.1 K/uL   RBC Morphology RARE NRBCs    WBC Morphology      MARKED LEFT SHIFT (>5% METAS,MYELOS AND PROS, OCC BLAST NOTED)   Smear Review      PATH REVIEW HAS BEEN ORDERED ON THIS ACCESSION NUMBER    Comment: Performed at [facility], [place] [address] [place], [place] [address]  Protime-INR     Status: Abnormal   Collection Time: 08/17/17  1:50 PM  Result Value Ref Range   Prothrombin Time 15.6 (H) 11.4 - 15.2 seconds   INR 1.25     Comment: Performed at [facility], [place] [address] [place], [place] [address]  I-Stat CG4 Lactic Acid,  ED     Status: Abnormal   Collection Time: 08/17/17  2:00 PM  Result Value Ref Range   Lactic Acid, Venous 2.84 (HH) 0.5 - 1.9 mmol/L   Comment NOTIFIED PHYSICIAN   I-Stat Troponin, ED (not at [facility])     Status: None   Collection Time: 08/17/17  2:01 PM  Result Value Ref Range   Troponin i, poc 0.08 0.00 - 0.08 ng/mL   Comment 3            Comment: Due to the release kinetics of cTnI, a negative result within the first hours of the onset of symptoms does not rule out myocardial infarction with certainty. If myocardial infarction is still suspected, repeat the test at appropriate intervals.   I-Stat CG4 Lactic Acid, ED     Status: None   Collection Time: 08/17/17  4:13 PM  Result Value Ref Range   Lactic Acid, Venous 1.07 0.5 - 1.9 mmol/L  Urinalysis, Routine w reflex microscopic     Status: Abnormal   Collection Time: 08/17/17  5:00 PM  Result Value Ref Range   Color, Urine YELLOW YELLOW   APPearance CLEAR CLEAR   Specific Gravity, Urine 1.014 1.005 - 1.030   pH 5.0 5.0 - 8.0   Glucose, UA NEGATIVE NEGATIVE mg/dL   Hgb urine dipstick NEGATIVE NEGATIVE   Bilirubin Urine NEGATIVE NEGATIVE   Ketones, ur NEGATIVE NEGATIVE mg/dL   Protein, ur NEGATIVE NEGATIVE mg/dL   Nitrite NEGATIVE NEGATIVE   Leukocytes, UA NEGATIVE NEGATIVE   RBC / HPF 0-5 0 - 5 RBC/hpf   WBC, UA 0-5 0 - 5 WBC/hpf   Bacteria, UA FEW (A) NONE SEEN    Comment: Performed at  [facility], [place] [address], [place], [place] [address]  Basic metabolic panel     Status: Abnormal   Collection Time: 08/18/17  9:21 AM  Result Value Ref Range   Sodium 140 135 - 145 mmol/L   Potassium 4.0 3.5 - 5.1 mmol/L   Chloride 107 101 - 111 mmol/L   CO2 24 22 - 32 mmol/L   Glucose, Bld 140 (H) 65 - 99 mg/dL   BUN 26 (H) 6 - 20 mg/dL   Creatinine, Ser 1.22 (H) 0.44 - 1.00 mg/dL   Calcium 7.8 (L) 8.9 - 10.3 mg/dL   GFR calc non Af Amer 44 (L) >60 mL/min   GFR calc Af Amer 52 (L) >60 mL/min    Comment:  (NOTE) The eGFR has been calculated using the CKD EPI equation. This calculation has not been validated in all clinical situations. eGFR's persistently <60 mL/min signify possible Chronic Kidney Disease.    Anion gap 9 5 - 15    Comment: Performed at [facility], [place] [address] [place], [place] [address]  CBC with Differential/Platelet     Status: Abnormal   Collection Time: 08/18/17  9:21 AM  Result Value Ref Range   WBC 13.5 (H) 4.0 - 10.5 K/uL   RBC 2.50 (L) 3.87 - 5.11 MIL/uL   Hemoglobin 7.2 (L) 12.0 - 15.0 g/dL    Comment: REPEATED TO VERIFY DELTA CHECK NOTED RESULTS VERIFIED VIA RECOLLECT    HCT 22.6 (L) 36.0 - 46.0 %   MCV 90.4 78.0 - 100.0 fL   MCH 28.8 26.0 - 34.0 pg   MCHC 31.9 30.0 - 36.0 g/dL   RDW 16.8 (H) 11.5 - 15.5 %   Platelets 267 150 - 400 K/uL   Neutrophils Relative % 57 %   Lymphocytes Relative 6 %   Monocytes Relative 3 %   Eosinophils Relative 0 %   Basophils Relative 0 %   Metamyelocytes Relative 8 %   Myelocytes 23 %   Promyelocytes Relative 2 %   Other 1 %   Neutro Abs 12.2 (H) 1.7 - 7.7 K/uL   Lymphs Abs 0.8 0.7 - 4.0 K/uL   Monocytes Absolute 0.4 0.1 - 1.0 K/uL   Eosinophils Absolute 0.0 0.0 - 0.7 K/uL   Basophils Absolute 0.0 0.0 - 0.1 K/uL   WBC Morphology      MODERATE LEFT SHIFT (>5% METAS AND MYELOS,OCC PRO NOTED)    Comment: Performed at [place], [place] [address] [place], [place] [address]  Hepatic function panel     Status: Abnormal   Collection Time: 08/18/17  9:21 AM  Result Value Ref Range   Total Protein 5.4 (L) 6.5 - 8.1 g/dL   Albumin 1.6 (L) 3.5 - 5.0 g/dL   AST 34 15 - 41 U/L   ALT 19 14 - 54 U/L   Alkaline Phosphatase 100 38 - 126 U/L   Total Bilirubin 0.3 0.3 - 1.2 mg/dL   Bilirubin, Direct <0.1 (L) 0.1 - 0.5 mg/dL   Indirect Bilirubin NOT CALCULATED 0.3 - 0.9 mg/dL    Comment: Performed at [facility], [place] [address], [place], [place] [address]   Cortisol-am, blood     Status: Abnormal   Collection Time: 08/18/17  9:21 AM  Result Value Ref Range   Cortisol - AM 4.8 (L) 6.7 - 22.6 ug/dL    Comment: Performed at [facility] [address] [place], [place] [address]  Current Facility-Administered Medications  Medication Dose Route Frequency Provider Last Rate Last Dose  . 0.9 %  sodium chloride infusion   Intravenous Continuous Lavina Hamman, MD 125 mL/hr at 08/17/17 2006    . acetaminophen (TYLENOL) tablet 650 mg  650 mg Oral Q6H PRN Lavina Hamman, MD       Or  . acetaminophen (TYLENOL) suppository 650 mg  650 mg Rectal Q6H PRN Lavina Hamman, MD      . dexamethasone (DECADRON) tablet 4 mg  4 mg Oral BID WC Lavina Hamman, MD   4 mg at 08/18/17 0917  . divalproex (DEPAKOTE ER) 24 hr tablet 500 mg  500 mg Oral QHS Lavina Hamman, MD   500 mg at 08/17/17 2121  . docusate sodium (COLACE) capsule 100 mg  100 mg Oral BID Lavina Hamman, MD   100 mg at 08/18/17 2595  . feeding supplement (ENSURE ENLIVE) (ENSURE ENLIVE) liquid 237 mL  237 mL Oral BID BM Lavina Hamman, MD   237 mL at 08/18/17 0941  . HYDROcodone-acetaminophen (NORCO) 10-325 MG per tablet 1 tablet  1 tablet Oral Q4H PRN Lavina Hamman, MD      . magnesium oxide (MAG-OX) tablet 400 mg  400 mg Oral BID Lavina Hamman, MD   400 mg at 08/18/17 0917  . ondansetron (ZOFRAN) tablet 4 mg  4 mg Oral Q6H PRN Lavina Hamman, MD       Or  . ondansetron [facility]) injection 4 mg  4 mg Intravenous Q6H PRN Lavina Hamman, MD      . polyethylene glycol (MIRALAX / GLYCOLAX) packet 17 g  17 g Oral Daily Lavina Hamman, MD   17 g at 08/17/17 2121  . potassium chloride (K-DUR,KLOR-CON) CR tablet 10 mEq  10 mEq Oral Daily Lavina Hamman, MD   10 mEq at 08/18/17 0917  . prochlorperazine (COMPAZINE) tablet 10 mg  10 mg Oral Q6H PRN Lavina Hamman, MD      . risperiDONE (RISPERDAL) tablet 1 mg  1 mg Oral Daily Lavina Hamman, MD   1 mg at 08/18/17 0917  . rivaroxaban  (XARELTO) tablet 20 mg  20 mg Oral Q supper Luiz Ochoa, RPH   20 mg at 08/17/17 2121  . sodium chloride flush (NS) 0.9 % injection 10-40 mL  10-40 mL Intracatheter PRN Samuella Cota, MD        Musculoskeletal: Strength & Muscle Tone: decreased Gait & Station: UTA since patient was lying in bed. Patient leans: N/A  Psychiatric Specialty Exam: Physical Exam  Nursing note and vitals reviewed. Constitutional: She is oriented to person, place, and time. She appears well-developed and well-nourished.  HENT:  Head: Normocephalic and atraumatic.  Neck: Normal range of motion.  Respiratory: Effort normal.  Musculoskeletal: Normal range of motion.  Neurological: She is alert and oriented to person, place, and time.  Psychiatric: Her behavior is normal. Judgment and thought content normal. Her affect is blunt. Her speech is delayed. Cognition and memory are normal.    Review of Systems  Constitutional: Negative for chills and fever.  Cardiovascular: Negative for chest pain.  Gastrointestinal: Negative for abdominal pain, diarrhea, nausea and vomiting.  Psychiatric/Behavioral: Negative for depression, hallucinations, substance abuse and suicidal ideas. The patient is nervous/anxious. The patient does not have insomnia.   All other systems reviewed and are negative.   Blood pressure (!) 103/55, pulse 79, temperature 98.6 F (37 C), temperature  source Oral, resp. rate (!) 22, height 5' 4"  (1.626 m), weight 71.2 kg (156 lb 15.5 oz), SpO2 98 %.Body mass index is 26.94 kg/m.  General Appearance: Fairly Groomed, elderly, African American female wearing a hospital gown with a towel over her head. NAD.   Eye Contact:  Poor and looks mostly down while speaking with brief moments of eye contact.   Speech:  Clear and Coherent and Slow  Volume:  Decreased  Mood:  Anxious  Affect:  Blunt  Thought Process:  Goal Directed, Linear and Descriptions of Associations: Intact  Orientation:  Full  (Time, Place, and Person)  Thought Content:  Logical  Suicidal Thoughts:  No  Homicidal Thoughts:  No  Memory:  Immediate;   Fair Recent;   Fair Remote;   Fair  Judgement:  Fair  Insight:  Fair  Psychomotor Activity:  Decreased  Concentration:  Concentration: Good and Attention Span: Good  Recall:  [employer] of Knowledge:  Fair  Language:  Good  Akathisia:  No  Handed:  Right  AIMS (if indicated):   N/A  Assets:  Financial Resources/Insurance Housing Social Support  ADL's:  Impaired  Cognition:  WNL  Sleep:   Good   Assessment:  VAUGHN FRIEZE is a 67 y.o. female who was admitted with hypotension and tachycardia secondary to dehydration. She is also receiving treatment for UTI. Son reports that she has been delusional and responding to internal stimuli. Her presentation could be attributed to poor medication compliance at home as well as AMS secondary to UTI. She has been restarted on her home medications. She denies SI, HI or AVH. She does not appear to be responding to internal stimuli. She does not warrant inpatient psychiatric hospitalization at this time.   Treatment Plan Summary: -Continue Depakote 500 mg qhs and Risperdal 1 mg daily. Switch Risperdal to nighttime dosing in order to prevent daytime drowsiness. -Patient should follow up with her outpatient mental health provider for further medication management.  -Patient is psychiatrically cleared. Psychiatry will sign off on patient at this time. Please consult psychiatry again as needed.    Disposition: No evidence of imminent risk to self or others at present.   Patient does not meet criteria for psychiatric inpatient admission.  Faythe Dingwall, DO 08/18/2017 11:46 AM

## 2017-08-18 NOTE — NC FL2 (Signed)
[place] MEDICAID FL2 LEVEL OF CARE SCREENING TOOL     IDENTIFICATION  Patient Name: Emily Duarte Birthdate: November 08, 1950 Sex: female Admission Date (Current Location): 08/17/2017  [facility] and [place] Number:  Herbalist and Address:  [facility],  [place] [address], [place]      Provider Number: [id number]  Attending Physician Name and Address:  Lavina Hamman, MD  Relative Name and Phone Number:       Current Level of Care: Hospital Recommended Level of Care: [place] Prior Approval Number:    Date Approved/Denied:   PASRR Number:    Discharge Plan: SNF    Current Diagnoses: Patient Active Problem List   Diagnosis Date Noted  . Schizophrenia ([place])   . Dehydration 08/17/2017  . Protein-calorie malnutrition, severe 08/13/2017  . UTI (urinary tract infection) 08/11/2017  . Hypomagnesemia 06/23/2017  . Acute prerenal failure ([place]) 06/23/2017  . Left femoral vein DVT ([place]) 06/20/2017  . Lung nodule < 6cm on CT 06/06/2017  . Hypokalemia 06/06/2017  . Nausea with vomiting 06/06/2017  . Delusions ([place])   . Goals of care, counseling/discussion 03/12/2017  . Cancer associated pain 03/12/2017  . Protein-calorie malnutrition, moderate ([place]) 03/03/2017  . Schizoaffective disorder, depressive type ([place]) 02/05/2017  . Peripheral neuropathy due to chemotherapy ([place]) 12/09/2016  . Elevated liver enzymes 09/06/2016  . Anemia, chronic disease 09/06/2016  . Endometrial/uterine adenocarcinoma ([place]) 08/19/2016  . Bipolar 1 disorder, mixed, severe ([place]) 08/07/2012  . Cyst of soft tissue 08/07/2012    Orientation RESPIRATION BLADDER Height & Weight     Self, Time, Situation, Place  Normal Continent Weight: 156 lb 15.5 oz (71.2 kg) Height:  5\' 4"  (162.6 cm)  BEHAVIORAL SYMPTOMS/MOOD NEUROLOGICAL BOWEL NUTRITION STATUS      Continent Diet(See dc summary)  AMBULATORY STATUS COMMUNICATION OF NEEDS Skin   Extensive Assist  Verbally Normal                       Personal Care Assistance Level of Assistance  Bathing, Feeding, Dressing Bathing Assistance: Limited assistance Feeding assistance: Independent Dressing Assistance: Limited assistance     Functional Limitations Info  Sight, Hearing, Speech Sight Info: Adequate Hearing Info: Adequate Speech Info: Adequate    SPECIAL CARE FACTORS FREQUENCY  PT (By licensed PT), OT (By licensed OT)     PT Frequency: 5x/week OT Frequency: 5x/week            Contractures Contractures Info: Not present    Additional Factors Info  Code Status, Allergies Code Status Info: Full Allergies Info: NKA           Current Medications (08/18/2017):  This is the current hospital active medication list Current Facility-Administered Medications  Medication Dose Route Frequency Provider Last Rate Last Dose  . 0.9 %  sodium chloride infusion   Intravenous Continuous Lavina Hamman, MD 75 mL/hr at 08/18/17 1543    . acetaminophen (TYLENOL) tablet 650 mg  650 mg Oral Q6H PRN Lavina Hamman, MD       Or  . acetaminophen (TYLENOL) suppository 650 mg  650 mg Rectal Q6H PRN Lavina Hamman, MD      . dexamethasone (DECADRON) tablet 4 mg  4 mg Oral BID WC Lavina Hamman, MD   4 mg at 08/18/17 0917  . divalproex (DEPAKOTE ER) 24 hr tablet 500 mg  500 mg Oral QHS Lavina Hamman, MD   500 mg at 08/17/17  2121  . docusate sodium (COLACE) capsule 100 mg  100 mg Oral BID Lavina Hamman, MD   100 mg at 08/18/17 4034  . feeding supplement (ENSURE ENLIVE) (ENSURE ENLIVE) liquid 237 mL  237 mL Oral BID BM Lavina Hamman, MD   237 mL at 08/18/17 1355  . HYDROcodone-acetaminophen (NORCO) 10-325 MG per tablet 1 tablet  1 tablet Oral Q4H PRN Lavina Hamman, MD      . magnesium oxide (MAG-OX) tablet 400 mg  400 mg Oral BID Lavina Hamman, MD   400 mg at 08/18/17 0917  . ondansetron (ZOFRAN) tablet 4 mg  4 mg Oral Q6H PRN Lavina Hamman, MD       Or  . ondansetron [facility])  injection 4 mg  4 mg Intravenous Q6H PRN Lavina Hamman, MD      . polyethylene glycol (MIRALAX / GLYCOLAX) packet 17 g  17 g Oral Daily Lavina Hamman, MD   17 g at 08/17/17 2121  . potassium chloride (K-DUR,KLOR-CON) CR tablet 10 mEq  10 mEq Oral Daily Lavina Hamman, MD   10 mEq at 08/18/17 0917  . prochlorperazine (COMPAZINE) tablet 10 mg  10 mg Oral Q6H PRN Lavina Hamman, MD      . risperiDONE (RISPERDAL) tablet 1 mg  1 mg Oral Daily Lavina Hamman, MD   1 mg at 08/18/17 0917  . rivaroxaban (XARELTO) tablet 20 mg  20 mg Oral Q supper Luiz Ochoa, RPH   20 mg at 08/17/17 2121  . sodium chloride flush (NS) 0.9 % injection 10-40 mL  10-40 mL Intracatheter PRN Samuella Cota, MD         Discharge Medications: Please see discharge summary for a list of discharge medications.  Relevant Imaging Results:  Relevant Lab Results:   Additional Information SS# [id number]  [name], LCSW

## 2017-08-18 NOTE — Evaluation (Signed)
Physical Therapy Evaluation Patient Details Name: Emily Duarte MRN: [id number] DOB: 09/21/1950 Today's Date: 08/18/2017   History of Present Illness  67 yo female admitted with dehydration, SIRS. Hx of endometrial ca, schizophrenia, L UE DVT, paranoid schizophrenia  Clinical Impression  On eval, pt required Min assist for mobility. She was able to stand and take a few steps along the side of bed with a RW. R LE appears weaker than L LE. Pt presents with general weakness, decreased activity tolerance, and impaired gait and balance. Recommend SNF. Will follow and progress activity as tolerated.     Follow Up Recommendations SNF    Equipment Recommendations  None recommended by PT    Recommendations for Other Services       Precautions / Restrictions Precautions Precautions: Fall Restrictions Weight Bearing Restrictions: No      Mobility  Bed Mobility Overal bed mobility: Needs Assistance Bed Mobility: Supine to Sit;Sit to Supine     Supine to sit: Min guard Sit to supine: Min assist   General bed mobility comments: Assist for LEs. Increased time.  Transfers Overall transfer level: Needs assistance Equipment used: Rolling walker (2 wheeled) Transfers: Sit to/from Stand Sit to Stand: Min assist         General transfer comment: Assist to rise, stabilize, control descent. VCs safety, hand placement. Increaed time. Unsteady.   Ambulation/Gait Ambulation/Gait assistance: Min assist           General Gait Details: side steps along side of bed with RW. VCS safety, technique. Assist to stabilize.   Stairs            Wheelchair Mobility    Modified Rankin (Stroke Patients Only)       Balance Overall balance assessment: Needs assistance         Standing balance support: Bilateral upper extremity supported Standing balance-Leahy Scale: Poor                               Pertinent Vitals/Pain Pain Assessment: No/denies pain     Home Living Family/patient expects to be discharged to:: Skilled nursing facility Living Arrangements: Alone   Type of Home: Apartment     Entrance Stairs-Number of Steps: 1 Home Layout: One level Home Equipment: Walker - 4 wheels      Prior Function Level of Independence: Needs assistance   Gait / Transfers Assistance Needed: ambulatory with walker inside home            Hand Dominance        Extremity/Trunk Assessment   Upper Extremity Assessment Upper Extremity Assessment: Generalized weakness    Lower Extremity Assessment RLE Deficits / Details: hip flexion 2+/5, knee ext at least 3/5 LLE Deficits / Details: at least 3/5 throughout    Cervical / Trunk Assessment Cervical / Trunk Assessment: Normal  Communication   Communication: No difficulties  Cognition Arousal/Alertness: Awake/alert Behavior During Therapy: WFL for tasks assessed/performed Overall Cognitive Status: No family/caregiver present to determine baseline cognitive functioning                                 General Comments: oriented to situation, but some decreased problem solving       General Comments      Exercises     Assessment/Plan    PT Assessment Patient needs continued PT services  PT Problem List Decreased  strength;Decreased balance;Decreased mobility;Decreased activity tolerance;Decreased knowledge of use of DME;Decreased safety awareness       PT Treatment Interventions DME instruction;Gait training;Functional mobility training;Therapeutic activities;Balance training;Patient/family education;Therapeutic exercise    PT Goals (Current goals can be found in the Care Plan section)  Acute Rehab PT Goals Patient Stated Goal: none stated PT Goal Formulation: With patient Time For Goal Achievement: 09/01/17 Potential to Achieve Goals: Good    Frequency Min 3X/week   Barriers to discharge        Co-evaluation               AM-PAC PT "6 Clicks"  Daily Activity  Outcome Measure Difficulty turning over in bed (including adjusting bedclothes, sheets and blankets)?: A Lot Difficulty moving from lying on back to sitting on the side of the bed? : A Lot Difficulty sitting down on and standing up from a chair with arms (e.g., wheelchair, bedside commode, etc,.)?: Unable Help needed moving to and from a bed to chair (including a wheelchair)?: A Lot Help needed walking in hospital room?: A Lot Help needed climbing 3-5 steps with a railing? : Total 6 Click Score: 10    End of Session Equipment Utilized During Treatment: Gait belt Activity Tolerance: Patient limited by fatigue Patient left: in bed;with call bell/phone within reach;with bed alarm set   PT Visit Diagnosis: Muscle weakness (generalized) (M62.81);Difficulty in walking, not elsewhere classified (R26.2);Other abnormalities of gait and mobility (R26.89);Unsteadiness on feet (R26.81)    Time: 0102-7253 PT Time Calculation (min) (ACUTE ONLY): 13 min   Charges:   PT Evaluation $PT Eval Moderate Complexity: 1 Mod     PT G Codes:         Weston Anna, MPT Pager: [phone]

## 2017-08-18 NOTE — Progress Notes (Signed)
[facility] Progress Note  Patient: Emily Duarte RSW:[id number]   PCP: Nolene Ebbs, MD DOB: 1950/09/22   DOA: 08/17/2017   DOS: 08/18/2017   Date of Service: the patient was seen and examined on 08/18/2017  Subjective: Feeling better, no nausea no vomiting.  Oral intake is better.  She actually walked to the bathroom today.  Denies having any hallucination or delusion.  Brief hospital course: Pt. with PMH of Emily Duarte is a 67 y.o. female with Past medical history of endometrial cancer, hypertension, schizophrenia, left arm DVT; admitted on 08/17/2017, presented with complaint of low blood pressure, was found to have SIRS with secondary adrenal insufficiency. Currently further plan is continue IV fluids.  Assessment and Plan: 1.  SIRS Dehydration ?  Secondary adrenal insufficiency. Patient presents with hypotension and tachycardia.  No evidence of active infection based on the current available information of chest x-ray as well as UA. Clinical examination also unremarkable. Lactic acid was elevated likely from dehydration. Improved with IV hydration. Patient's hypotension can also be because of secondary adrenal insufficiency as the patient was on chronic steroids and her compliance is questionable given her history of schizophrenia. At present we will continue IV hydration for the patient. Cortisol level 4.8 diagnosis of insufficiency.  Continue oral Decadron.  2.  History of schizophrenia with paranoid behavior. History of delusion and hallucination. Patient was treated with Depakote twice daily and risperidone 0.5 mg nightly and currently on Depakote daily only with undetectable Depakote level last admission. I question patient's compliance and psychiatric consult, recommend continue to current regimen  3.  Constipation. X-ray abdomen shows significant constipation continue stool softeners for now.  4.  Left leg DVT. Patient is on Xarelto. Renal  function better with hydration, continue 20 mg Xarelto.  5.  Anemia of the chronic disease worsening due to chemotherapy. Monitor H&H. No active bleeding reported last admission.  6.  chronic kidney disease stage III. Renal function stable currently close to her baseline. Although patient clinically dehydrated. Continue IV hydration.  7.  Bilateral pleural effusion. Chest x-ray shows bilateral pleural effusion progressively worsening although on my evaluation the axilla appears to be stable. This pleural effusion has been present since January. Unsure of etiology but currently patient does not appear to be in any distress. Monitor.  8.  Acute urinary retention. Patient has required in and out catheterization x3 so far. We will continue bladder scan, if the patient has further retention will Place Foley catheter for now.  9.  Hypoalbuminemia. Patient has tendency to third space due to her hypoalbuminemia.  Monitor while receiving IV hydration.  Diet: rgular diet DVT Prophylaxis: subcutaneous Heparin  Advance goals of care discussion: full code  Family Communication: no family was present at bedside, at the time of interview.  Disposition:  Discharge to SNF.  Consultants: none Procedures: none  Antibiotics: Anti-infectives (From admission, onward)   Start     Dose/Rate Route Frequency Ordered Stop   08/17/17 1415  ceFEPIme (MAXIPIME) 2 g in sodium chloride 0.9 % 100 mL IVPB     2 g 200 mL/hr over 30 Minutes Intravenous  Once 08/17/17 1407 08/17/17 1606       Objective: Physical Exam: Vitals:   08/17/17 1830 08/17/17 1958 08/18/17 0538 08/18/17 1356  BP: (!) 92/58 108/67 (!) 103/55 (!) 104/58  Pulse: (!) 101 (!) 101 79 (!) 101  Resp: 17 (!) 24 (!) 22 16  Temp:  98.2 F (36.8 C) 98.6 F (37 C) 97.9  F (36.6 C)  TempSrc:  Oral Oral Oral  SpO2: 95% 95% 98% 98%  Weight:   71.2 kg (156 lb 15.5 oz)   Height:        Intake/Output Summary (Last 24 hours) at  08/18/2017 1848 Last data filed at 08/18/2017 1500 Gross per 24 hour  Intake 2482.5 ml  Output 450 ml  Net 2032.5 ml   Filed Weights   08/17/17 1327 08/18/17 0538  Weight: 63.5 kg (140 lb) 71.2 kg (156 lb 15.5 oz)   General: Alert, Awake and Oriented to Time, Place and Person. Appear in mild distress, affect blunted Eyes: PERRL, Conjunctiva normal ENT: Oral Mucosa clear moist. Neck: difficult to assess JVD, no Abnormal Mass Or lumps Cardiovascular: S1 and S2 Present, no Murmur, Peripheral Pulses Present Respiratory: normal respiratory effort, Bilateral Air entry equal and Decreased, no use of accessory muscle, Clear to Auscultation, no Crackles, no wheezes Abdomen: Bowel Sound present, Soft and no tenderness, no hernia Skin: no redness, no Rash, no induration Extremities: no Pedal edema, no calf tenderness Neurologic: Grossly no focal neuro deficit. Bilaterally Equal motor strength  Data Reviewed: CBC: Recent Labs  Lab 08/13/17 0416 08/13/17 1025 08/14/17 1207 08/15/17 0811 08/15/17 2207 08/17/17 1350 08/18/17 0921  WBC QUESTIONABLE RESULTS, RECOMMEND RECOLLECT TO VERIFY 2.0* 4.3 5.4 8.4 12.1* 13.5*  NEUTROABS QUESTIONABLE RESULTS, RECOMMEND RECOLLECT TO VERIFY 0.5*  --   --   --  7.1 12.2*  HGB QUESTIONABLE RESULTS, RECOMMEND RECOLLECT TO VERIFY 7.7* 8.7* 7.8* 8.7* 9.1* 7.2*  HCT QUESTIONABLE RESULTS, RECOMMEND RECOLLECT TO VERIFY 23.5* 27.4* 24.1* 27.0* 28.7* 22.6*  MCV QUESTIONABLE RESULTS, RECOMMEND RECOLLECT TO VERIFY 89.0 88.4 88.0 87.7 90.0 90.4  PLT QUESTIONABLE RESULTS, RECOMMEND RECOLLECT TO VERIFY 146* 256 218 320 324 841   Basic Metabolic Panel: Recent Labs  Lab 08/12/17 0811 08/13/17 0416 08/13/17 1025 08/14/17 1207 08/15/17 0811 08/15/17 2207 08/17/17 1350 08/18/17 0921  NA 140 QUESTIONABLE RESULTS, RECOMMEND RECOLLECT TO VERIFY 137 140 138 135 138 140  K 4.4 QUESTIONABLE RESULTS, RECOMMEND RECOLLECT TO VERIFY 4.1 3.8 3.4* 4.4 3.7 4.0  CL 107  QUESTIONABLE RESULTS, RECOMMEND RECOLLECT TO VERIFY 106 104 103 97* 98* 107  CO2 23 QUESTIONABLE RESULTS, RECOMMEND RECOLLECT TO VERIFY 22 24 24 23 28 24   GLUCOSE 114* QUESTIONABLE RESULTS, RECOMMEND RECOLLECT TO VERIFY 117* 116* 94 120* 100* 140*  BUN 29* QUESTIONABLE RESULTS, RECOMMEND RECOLLECT TO VERIFY 34* 33* 32* 34* 31* 26*  CREATININE 1.51* QUESTIONABLE RESULTS, RECOMMEND RECOLLECT TO VERIFY 1.35* 1.47* 1.40* 1.52* 1.39* 1.22*  CALCIUM 8.0* QUESTIONABLE RESULTS, RECOMMEND RECOLLECT TO VERIFY 7.9* 8.7* 8.0* 8.4* 8.6* 7.8*  MG 1.5* QUESTIONABLE RESULTS, RECOMMEND RECOLLECT TO VERIFY 1.5*  --   --   --   --   --     Liver Function Tests: Recent Labs  Lab 08/15/17 2207 08/17/17 1350 08/18/17 0921  AST 37 32 34  ALT 22 19 19   ALKPHOS 127* 114 100  BILITOT 0.1* 0.3 0.3  PROT 5.8* 5.9* 5.4*  ALBUMIN 2.0* 1.8* 1.6*   No results for input(s): LIPASE, AMYLASE in the last 168 hours. No results for input(s): AMMONIA in the last 168 hours. Coagulation Profile: Recent Labs  Lab 08/17/17 1350  INR 1.25   Cardiac Enzymes: No results for input(s): CKTOTAL, CKMB, CKMBINDEX, TROPONINI in the last 168 hours. BNP (last 3 results) No results for input(s): PROBNP in the last 8760 hours. CBG: No results for input(s): GLUCAP in the last 168 hours. Studies: No results found.  Scheduled Meds: . dexamethasone  4 mg Oral BID WC  . divalproex  500 mg Oral QHS  . docusate sodium  100 mg Oral BID  . feeding supplement (ENSURE ENLIVE)  237 mL Oral BID BM  . magnesium oxide  400 mg Oral BID  . polyethylene glycol  17 g Oral Daily  . potassium chloride  10 mEq Oral Daily  . risperiDONE  1 mg Oral Daily  . rivaroxaban  20 mg Oral Q supper   Continuous Infusions: . sodium chloride 75 mL/hr at 08/18/17 1543   PRN Meds: acetaminophen **OR** acetaminophen, HYDROcodone-acetaminophen, ondansetron **OR** ondansetron (ZOFRAN) IV, prochlorperazine, sodium chloride flush  Time spent: 35  minutes  Author: Berle Mull, MD [facility] Pager: [phone] 08/18/2017 6:48 PM  If 7PM-7AM, please contact night-coverage at [email or web address], password [facility]

## 2017-08-18 NOTE — Progress Notes (Signed)
In and out cath preformed. yielding 424ml of bright yellow urine. Pt tolerated well

## 2017-08-18 NOTE — Progress Notes (Signed)
Chaplain provided support at bedside.   Familiar with pt from prior admissions.  Emily Duarte was welcoming of chaplain presence.  Calm affect, circumstantial in conversation, easily redirected to conversation topic.  She feels well cared for by staff.  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  She requested prayers and follow up for continued support.    [facility] / [facility] Chaplain Jerene Pitch, MDiv, [facility]

## 2017-08-18 NOTE — Clinical Social Work Note (Addendum)
3:10 PM  Patient assessed on 4/25. No significant changes since assessment. LCSW met at bedside with patient.  Patient and family agreeable to SNF.   PLAN: SNF at dc.   [place] Work Assessment  Patient Details  Name: Emily Duarte MRN: [id number] Date of Birth: 1950/12/10  Date of referral:  08/18/17               Reason for consult:                   Permission sought to share information with:    Permission granted to share information::     Name::        Agency::     Relationship::     Contact Information:     Housing/Transportation Living arrangements for the past 2 months:    Source of Information:    Patient Interpreter Needed:    Criminal Activity/Legal Involvement Pertinent to Current Situation/Hospitalization:    Significant Relationships:    Lives with:    Do you feel safe going back to the place where you live?    Need for family participation in patient care:     Care giving concerns:  Pt admitted from home where she resides alone and son who lives nearby checks in on her daily. Reports PTA she was completing ADLs independently, son was driving her around for appointments and errands, and she was using a rolling walker with seat to ambulate.  Pt is seen at [facility], currently in chemotherapy treatments (uterine cancer). Also has bipolar disorder which is managed on medications. Currently admitted for UTI.    Social Worker assessment / plan:  CSW consulted to assist with disposition- SNF placement for ST rehab. Met with pt at bedside- she was alert and oriented, however affect very flat. Also discussed with son via phone. Pt and son understand recommendation for SNF, state they anticipated pt needing rehab after she sustained fall at home last week and further weakened during hospital stay. Son states he has applied for medicaid for pt as well anticipating pt may need custodial care at SNF in the future if unable to progress with therapy, however  current plan is for short term rehab with DC home once complete.  Son prefers [facility] and agreed to other referrals if [facility] not available. Pt states next chemotherapy is 08/29/17 and son confirms he can transport her, no facility transport needed.  Completed FL2 and made referrals. [facility] states unable to accept care of pts receiving chemotherapy. Provided other bed offers to son and he will research facilities tonight and give selection to CSW in the a.m.  Note- Could not submit for PASRR as site is down for maintenance, will submit pasrr when able. Pt will need approved pasrr prior to SNF admission (Hx of bipolar d/o for pasrr submission: Bipolar I disorder, managed on Riserdal and Depakote, hx of inpatient treatment as well, last admission at [facility] geriatric psychiatry unit 04/2017 for delusional thought and mood disturbance. No current related behaviors. Pt and son report medications managed by oncologist, last office visit 08/03/17)  Plan: SNF for ST rehab at DC Barriers: bed offers, PASRR  Employment status:    Insurance information:    PT Recommendations:    Information / Referral to community resources:     Patient/Family's Response to care:  Son appreciative, pt does not remark  Patient/Family's Understanding of and Emotional Response to Diagnosis, Current Treatment, and Prognosis:  Pt demonstrates adequate  understanding but speech is blunt, does not elaborate. Affect flat, reports emotions/mood fair.  Son expressed good understanding, has clearly researched potential plans and is proactive in seeking care for pt as evidenced by visiting facilities, planning for her appointments, applying for Medicaid, and keeping history of her care needs.  Emotional Assessment Appearance:    Attitude/Demeanor/Rapport:    Affect (typically observed):    Orientation:    Alcohol / Substance use:    Psych involvement (Current and /or in the community):     Discharge Needs   Concerns to be addressed:    Readmission within the last 30 days:    Current discharge risk:    Barriers to Discharge:      [name], LCSW 08/18/2017, 3:09 PM [phone]

## 2017-08-18 NOTE — Care Management Obs Status (Signed)
[place] NOTIFICATION   Patient Details  Name: Emily Duarte MRN: [id number] Date of Birth: October 12, 1950   Medicare Observation Status Notification Given:  Yes    Leeroy Cha, RN 08/18/2017, 11:19 AM

## 2017-08-19 LAB — CBC
HCT: 20.2 % — ABNORMAL LOW (ref 36.0–46.0)
HEMATOCRIT: 21.7 % — AB (ref 36.0–46.0)
HEMOGLOBIN: 7 g/dL — AB (ref 12.0–15.0)
Hemoglobin: 6.5 g/dL — CL (ref 12.0–15.0)
MCH: 29.4 pg (ref 26.0–34.0)
MCH: 29.4 pg (ref 26.0–34.0)
MCHC: 32.2 g/dL (ref 30.0–36.0)
MCHC: 32.3 g/dL (ref 30.0–36.0)
MCV: 91.4 fL (ref 78.0–100.0)
MCV: 91.2 fL (ref 78.0–100.0)
PLATELETS: 279 10*3/uL (ref 150–400)
Platelets: 306 10*3/uL (ref 150–400)
RBC: 2.21 MIL/uL — AB (ref 3.87–5.11)
RBC: 2.38 MIL/uL — AB (ref 3.87–5.11)
RDW: 16.8 % — ABNORMAL HIGH (ref 11.5–15.5)
RDW: 17.2 % — ABNORMAL HIGH (ref 11.5–15.5)
WBC: 13.6 10*3/uL — ABNORMAL HIGH (ref 4.0–10.5)
WBC: 15.5 10*3/uL — ABNORMAL HIGH (ref 4.0–10.5)

## 2017-08-19 LAB — BASIC METABOLIC PANEL
ANION GAP: 10 (ref 5–15)
BUN: 31 mg/dL — ABNORMAL HIGH (ref 6–20)
CALCIUM: 7.9 mg/dL — AB (ref 8.9–10.3)
CO2: 25 mmol/L (ref 22–32)
CREATININE: 1.15 mg/dL — AB (ref 0.44–1.00)
Chloride: 106 mmol/L (ref 101–111)
GFR, EST AFRICAN AMERICAN: 55 mL/min — AB (ref >60)
GFR, EST NON AFRICAN AMERICAN: 48 mL/min — AB (ref >60)
Glucose, Bld: 107 mg/dL — ABNORMAL HIGH (ref 65–99)
Potassium: 4.3 mmol/L (ref 3.5–5.1)
Sodium: 141 mmol/L (ref 135–145)

## 2017-08-19 LAB — URINE CULTURE: Culture: NO GROWTH

## 2017-08-19 LAB — MAGNESIUM: MAGNESIUM: 1.4 mg/dL — AB (ref 1.7–2.4)

## 2017-08-19 LAB — PREPARE RBC (CROSSMATCH)

## 2017-08-19 MED ORDER — SODIUM CHLORIDE 0.9 % IV SOLN
Freq: Once | INTRAVENOUS | Status: AC
  Administered 2017-08-19: 11:00:00 via INTRAVENOUS

## 2017-08-19 MED ORDER — SODIUM CHLORIDE 0.9 % IV SOLN: Freq: Once | INTRAVENOUS | Status: DC

## 2017-08-19 MED ORDER — MAGNESIUM SULFATE 2 GM/50ML IV SOLN
2.0000 g | Freq: Once | INTRAVENOUS | Status: AC
  Administered 2017-08-19: 2 g via INTRAVENOUS
  Filled 2017-08-19: qty 50

## 2017-08-19 MED ORDER — HYDRALAZINE HCL 20 MG/ML IJ SOLN: 10.0000 mg | Freq: Four times a day (QID) | INTRAMUSCULAR | Status: DC | PRN

## 2017-08-19 NOTE — Progress Notes (Signed)
[facility] Progress Note  Patient: Emily Duarte   PCP: Nolene Ebbs, MD DOB: Sep 08, 1950   DOA: 08/17/2017   DOS: 08/19/2017   Date of Service: the patient was seen and examined on 08/19/2017  Subjective: No acute complaint.  No nausea no vomiting.  No blood in the stool.  No black-colored bowel movement reported.  Brief hospital course: Pt. with PMH of Emily Duarte is a 67 y.o. female with Past medical history of endometrial cancer, hypertension, schizophrenia, left arm DVT; admitted on 08/17/2017, presented with complaint of low blood pressure, was found to have SIRS with secondary adrenal insufficiency. Currently further plan is transfuse PRBC and monitor H&H  Assessment and Plan: 1.  SIRS Dehydration ?  Secondary adrenal insufficiency. Patient presents with hypotension and tachycardia.  No evidence of active infection based on the current available information of chest x-ray as well as UA. Clinical examination also unremarkable. Lactic acid was elevated likely from dehydration. Improved with IV hydration. Patient's hypotension can also be because of secondary adrenal insufficiency as the patient was on chronic steroids and her compliance is questionable given her history of schizophrenia. Patient was given aggressive IV hydration, currently we will hold off IV fluids Cortisol level 4.8 diagnosis of insufficiency.  Continue oral Decadron.  2.  History of schizophrenia with paranoid behavior. History of delusion and hallucination. Patient was treated with Depakote twice daily and risperidone 0.5 mg nightly and currently on Depakote daily only with undetectable Depakote level last admission. I question patient's compliance and psychiatric consult, recommend continue to current regimen  3.  Constipation. X-ray abdomen shows significant constipation continue stool softeners for now.  4.  Left leg DVT. Patient is on Xarelto. Renal function better  with hydration, continue 20 mg Xarelto.  5.  Anemia of the chronic disease worsening due to chemotherapy. Hemoglobin dropped to 6.5 today, likely dilutional in nature receiving IV fluids. Transfuse 2 units and monitor H&H.  FOBT ordered.  Continue anticoagulation. No active bleeding reported last admission.  6.  chronic kidney disease stage III. Renal function stable currently close to her baseline. Although patient clinically dehydrated. Renal function significantly better than baseline.  7.  Bilateral pleural effusion. Chest x-ray shows bilateral pleural effusion progressively worsening although on my evaluation the axilla appears to be stable. This pleural effusion has been present since January. Unsure of etiology but currently patient does not appear to be in any distress. Monitor.  8.  Acute urinary retention. Patient has required in and out catheterization x3 so far. We will continue bladder scan, if the patient has further retention will Place Foley catheter for now.  9.  Hypoalbuminemia. Patient has tendency to third space due to her hypoalbuminemia.   Diet: regular diet DVT Prophylaxis: subcutaneous Heparin  Advance goals of care discussion: full code  Family Communication: no family was present at bedside, at the time of interview.  Disposition:  Discharge to SNF.  Consultants: none Procedures: none  Antibiotics: Anti-infectives (From admission, onward)   Start     Dose/Rate Route Frequency Ordered Stop   08/17/17 1415  ceFEPIme (MAXIPIME) 2 g in sodium chloride 0.9 % 100 mL IVPB     2 g 200 mL/hr over 30 Minutes Intravenous  Once 08/17/17 1407 08/17/17 1606       Objective: Physical Exam: Vitals:   08/19/17 1405 08/19/17 1407 08/19/17 1438 08/19/17 1655  BP: 115/60 113/64 119/76 138/78  Pulse: 98 95 92 88  Resp: 14 14 14  12  Temp: 98.4 F (36.9 C) 98.4 F (36.9 C) 97.8 F (36.6 C) 98.6 F (37 C)  TempSrc: Oral Oral Oral Oral  SpO2: 96% 96% 98%  97%  Weight:      Height:        Intake/Output Summary (Last 24 hours) at 08/19/2017 1711 Last data filed at 08/19/2017 1655 Gross per 24 hour  Intake 1325 ml  Output 950 ml  Net 375 ml   Filed Weights   08/17/17 1327 08/18/17 0538 08/19/17 0557  Weight: 63.5 kg (140 lb) 71.2 kg (156 lb 15.5 oz) 72.7 kg (160 lb 4.4 oz)   General: Alert, Awake and Oriented to Time, Place and Person. Appear in mild distress, affect blunted Eyes: PERRL, Conjunctiva normal ENT: Oral Mucosa clear moist. Neck: difficult to assess JVD, no Abnormal Mass Or lumps Cardiovascular: S1 and S2 Present, no Murmur, Peripheral Pulses Present Respiratory: normal respiratory effort, Bilateral Air entry equal and Decreased, no use of accessory muscle, Clear to Auscultation, no Crackles, no wheezes Abdomen: Bowel Sound present, Soft and no tenderness, no hernia Skin: no redness, no Rash, no induration Extremities: no Pedal edema, no calf tenderness Neurologic: Grossly no focal neuro deficit. Bilaterally Equal motor strength  Data Reviewed: CBC: Recent Labs  Lab 08/13/17 0416 08/13/17 1025  08/15/17 2207 08/17/17 1350 08/18/17 0921 08/19/17 0550 08/19/17 0732  WBC QUESTIONABLE RESULTS, RECOMMEND RECOLLECT TO VERIFY 2.0*   < > 8.4 12.1* 13.5* 13.6* 15.5*  NEUTROABS QUESTIONABLE RESULTS, RECOMMEND RECOLLECT TO VERIFY 0.5*  --   --  7.1 12.2*  --   --   HGB QUESTIONABLE RESULTS, RECOMMEND RECOLLECT TO VERIFY 7.7*   < > 8.7* 9.1* 7.2* 6.5* 7.0*  HCT QUESTIONABLE RESULTS, RECOMMEND RECOLLECT TO VERIFY 23.5*   < > 27.0* 28.7* 22.6* 20.2* 21.7*  MCV QUESTIONABLE RESULTS, RECOMMEND RECOLLECT TO VERIFY 89.0   < > 87.7 90.0 90.4 91.4 91.2  PLT QUESTIONABLE RESULTS, RECOMMEND RECOLLECT TO VERIFY 146*   < > 320 324 267 279 306   < > = values in this interval not displayed.   Basic Metabolic Panel: Recent Labs  Lab 08/13/17 0416 08/13/17 1025  08/15/17 0811 08/15/17 2207 08/17/17 1350 08/18/17 0921 08/19/17 0550    NA QUESTIONABLE RESULTS, RECOMMEND RECOLLECT TO VERIFY 137   < > 138 135 138 140 141  K QUESTIONABLE RESULTS, RECOMMEND RECOLLECT TO VERIFY 4.1   < > 3.4* 4.4 3.7 4.0 4.3  CL QUESTIONABLE RESULTS, RECOMMEND RECOLLECT TO VERIFY 106   < > 103 97* 98* 107 106  CO2 QUESTIONABLE RESULTS, RECOMMEND RECOLLECT TO VERIFY 22   < > 24 23 28 24 25   GLUCOSE QUESTIONABLE RESULTS, RECOMMEND RECOLLECT TO VERIFY 117*   < > 94 120* 100* 140* 107*  BUN QUESTIONABLE RESULTS, RECOMMEND RECOLLECT TO VERIFY 34*   < > 32* 34* 31* 26* 31*  CREATININE QUESTIONABLE RESULTS, RECOMMEND RECOLLECT TO VERIFY 1.35*   < > 1.40* 1.52* 1.39* 1.22* 1.15*  CALCIUM QUESTIONABLE RESULTS, RECOMMEND RECOLLECT TO VERIFY 7.9*   < > 8.0* 8.4* 8.6* 7.8* 7.9*  MG QUESTIONABLE RESULTS, RECOMMEND RECOLLECT TO VERIFY 1.5*  --   --   --   --   --  1.4*   < > = values in this interval not displayed.    Liver Function Tests: Recent Labs  Lab 08/15/17 2207 08/17/17 1350 08/18/17 0921  AST 37 32 34  ALT 22 19 19   ALKPHOS 127* 114 100  BILITOT 0.1* 0.3 0.3  PROT 5.8*  5.9* 5.4*  ALBUMIN 2.0* 1.8* 1.6*   No results for input(s): LIPASE, AMYLASE in the last 168 hours. No results for input(s): AMMONIA in the last 168 hours. Coagulation Profile: Recent Labs  Lab 08/17/17 1350  INR 1.25   Cardiac Enzymes: No results for input(s): CKTOTAL, CKMB, CKMBINDEX, TROPONINI in the last 168 hours. BNP (last 3 results) No results for input(s): PROBNP in the last 8760 hours. CBG: No results for input(s): GLUCAP in the last 168 hours. Studies: No results found.  Scheduled Meds: . dexamethasone  4 mg Oral BID WC  . divalproex  500 mg Oral QHS  . docusate sodium  100 mg Oral BID  . feeding supplement (ENSURE ENLIVE)  237 mL Oral BID BM  . magnesium oxide  400 mg Oral BID  . polyethylene glycol  17 g Oral Daily  . potassium chloride  10 mEq Oral Daily  . risperiDONE  1 mg Oral Daily  . rivaroxaban  20 mg Oral Q supper   Continuous  Infusions:  PRN Meds: acetaminophen **OR** acetaminophen, HYDROcodone-acetaminophen, ondansetron **OR** ondansetron (ZOFRAN) IV, prochlorperazine, sodium chloride flush  Time spent: 35 minutes  Author: Berle Mull, MD [facility] Pager: [phone] 08/19/2017 5:11 PM  If 7PM-7AM, please contact night-coverage at [email or web address], password [facility]

## 2017-08-19 NOTE — Progress Notes (Addendum)
CRITICAL VALUE ALERT  Critical Value: Hgb = 6.5  Date & Time Notied:08/19/17 @0650    Provider Notified: yes via amion text   Orders Received/Actions taken:pending

## 2017-08-19 NOTE — Progress Notes (Signed)
BP is elevated this evening (932 systolic). MD notified- ordered to continue to monitor and if it gets above 355 systolic, hydralazine will be ordered. Will reassess at a later time.

## 2017-08-19 NOTE — Progress Notes (Signed)
Patient and family chose bed at [facility].   LCSW will follow for dc needs.   Emily Duarte [place]

## 2017-08-20 ENCOUNTER — Other Ambulatory Visit: Payer: Self-pay

## 2017-08-20 DIAGNOSIS — F251 Schizoaffective disorder, depressive type: Secondary | ICD-10-CM

## 2017-08-20 LAB — TYPE AND SCREEN
ABO/RH(D): O POS
ANTIBODY SCREEN: NEGATIVE
Unit division: 0
Unit division: 0

## 2017-08-20 LAB — CBC
HEMATOCRIT: 30.2 % — AB (ref 36.0–46.0)
HEMATOCRIT: 30.5 % — AB (ref 36.0–46.0)
HEMOGLOBIN: 9.7 g/dL — AB (ref 12.0–15.0)
Hemoglobin: 10.1 g/dL — ABNORMAL LOW (ref 12.0–15.0)
MCH: 28.3 pg (ref 26.0–34.0)
MCH: 28.9 pg (ref 26.0–34.0)
MCHC: 33.1 g/dL (ref 30.0–36.0)
MCHC: 32.1 g/dL (ref 30.0–36.0)
MCV: 87.1 fL (ref 78.0–100.0)
MCV: 88 fL (ref 78.0–100.0)
PLATELETS: 257 10*3/uL (ref 150–400)
Platelets: 257 10*3/uL (ref 150–400)
RBC: 3.43 MIL/uL — ABNORMAL LOW (ref 3.87–5.11)
RBC: 3.5 MIL/uL — ABNORMAL LOW (ref 3.87–5.11)
RDW: 17.1 % — ABNORMAL HIGH (ref 11.5–15.5)
RDW: 17.2 % — AB (ref 11.5–15.5)
WBC: 16 10*3/uL — ABNORMAL HIGH (ref 4.0–10.5)
WBC: 17 10*3/uL — AB (ref 4.0–10.5)

## 2017-08-20 LAB — BASIC METABOLIC PANEL
Anion gap: 11 (ref 5–15)
BUN: 33 mg/dL — AB (ref 6–20)
CHLORIDE: 104 mmol/L (ref 101–111)
CO2: 23 mmol/L (ref 22–32)
Calcium: 8 mg/dL — ABNORMAL LOW (ref 8.9–10.3)
Creatinine, Ser: 1.17 mg/dL — ABNORMAL HIGH (ref 0.44–1.00)
GFR calc Af Amer: 54 mL/min — ABNORMAL LOW (ref >60)
GFR calc non Af Amer: 47 mL/min — ABNORMAL LOW (ref >60)
GLUCOSE: 95 mg/dL (ref 65–99)
Potassium: 4.8 mmol/L (ref 3.5–5.1)
SODIUM: 138 mmol/L (ref 135–145)

## 2017-08-20 LAB — BPAM RBC
BLOOD PRODUCT EXPIRATION DATE: 201905242359
Blood Product Expiration Date: 201905242359
ISSUE DATE / TIME: 201904301415
ISSUE DATE / TIME: 201904301120
UNIT TYPE AND RH: 5100
Unit Type and Rh: 5100

## 2017-08-20 MED ORDER — HEPARIN SOD (PORK) LOCK FLUSH 100 UNIT/ML IV SOLN
500.0000 [IU] | INTRAVENOUS | Status: AC | PRN
  Administered 2017-08-20: 500 [IU]

## 2017-08-20 MED ORDER — HYDROCODONE-ACETAMINOPHEN 10-325 MG PO TABS: 1.0000 | ORAL_TABLET | ORAL | 0 refills | Status: DC | PRN

## 2017-08-20 MED ORDER — ENSURE ENLIVE PO LIQD: 237.0000 mL | Freq: Two times a day (BID) | ORAL | 12 refills | Status: DC

## 2017-08-20 NOTE — Progress Notes (Signed)
ANTICOAGULATION CONSULT NOTE - Follow Up Consult  Pharmacy Consult for xarelto Indication: hx DVT  No Known Allergies  Patient Measurements: Height: 5\' 4"  (162.6 cm) Weight: 160 lb 0.9 oz (72.6 kg) IBW/kg (Calculated) : 54.7 Heparin Dosing Weight:   Vital Signs: Temp: 97.4 F (36.3 C) (05/01 0528) Temp Source: Oral (05/01 0528) BP: 153/93 (05/01 0528) Pulse Rate: 84 (05/01 0528)  Labs: Recent Labs    08/17/17 1350 08/18/17 0921 08/19/17 0550 08/19/17 0732 08/19/17 2342 08/20/17 0457  HGB 9.1* 7.2* 6.5* 7.0* 9.7* 10.1*  HCT 28.7* 22.6* 20.2* 21.7* 30.2* 30.5*  PLT 324 267 279 306 257 257  LABPROT 15.6*  --   --   --   --   --   INR 1.25  --   --   --   --   --   CREATININE 1.39* 1.22* 1.15*  --   --  1.17*    Estimated Creatinine Clearance: 45 mL/min (A) (by C-G formula based on SCr of 1.17 mg/dL (H)).  Assessment: Patient's a 67 y.o F with hx endometrial cancer and recent DVT (March 2019) on xarelto PTA presented to the ED on 4/28 with hypotension.  Xarelto resumed on admission.  Spoke to Dr. Alvy Bimler on 4/30, she recom. xarelto 20 mg daily for patient.  Today, 08/20/2017: - cbc improved s/p 2 units PRBC on 4/30 - no bleeding documented - scr 1.17 (crcl~53 with TBW)    Plan:  - continue xarelto 20 mg daily - monitor renal function and for s/s bleeding  Cyris Maalouf P 08/20/2017,7:46 AM

## 2017-08-20 NOTE — Progress Notes (Signed)
Pts IV removed with a clean and dry dressing intact. Pt denies pain at this time. Report called.

## 2017-08-20 NOTE — Clinical Social Work Placement (Addendum)
    11:09 AM Patient and family chose bed at [facility]. Room #118  LCSW confirmed bed at facility.   LCSW faxed dc docs to facility.   LCSW notified family of transport.  Patient will transport by [facility].  RN report # [phone]  BKJ   CLINICAL SOCIAL WORK PLACEMENT  NOTE  Date:  08/20/2017  Patient Details  Name: Emily Duarte MRN: [id number] Date of Birth: 12-16-1950  Clinical Social Work is seeking post-discharge placement for this patient at the [place] level of care (*CSW will initial, date and re-position this form in  chart as items are completed):  Yes   Patient/family provided with [place] Work Department's list of facilities offering this level of care within the geographic area requested by the patient (or if unable, by the patient's family).  Yes   Patient/family informed of their freedom to choose among providers that offer the needed level of care, that participate in Medicare, Medicaid or managed care program needed by the patient, have an available bed and are willing to accept the patient.  Yes   Patient/family informed of [facility]'s ownership interest in [facility] and [facility], as well as of the fact that they are under no obligation to receive care at these facilities.  PASRR submitted to EDS on       PASRR number received on 08/19/17     Existing PASRR number confirmed on       FL2 transmitted to all facilities in geographic area requested by pt/family on 08/19/17     FL2 transmitted to all facilities within larger geographic area on 08/18/17     Patient informed that his/her managed care company has contracts with or will negotiate with certain facilities, including the following:        Yes   Patient/family informed of bed offers received.  Patient chooses bed at [place]     Physician recommends and patient chooses bed at      Patient to be transferred to  [facility] on 08/19/17.  Patient to be transferred to facility by [facility]     Patient family notified on 08/20/17 of transfer.  Name of family member notified:  [name]     PHYSICIAN Please prepare priority discharge summary, including medications     Additional Comment:    _______________________________________________ [name], LCSW 08/20/2017, 11:09 AM

## 2017-08-20 NOTE — Telephone Encounter (Signed)
Rx faxed to [facility] (p) [phone], (f) [phone]  

## 2017-08-20 NOTE — Discharge Summary (Signed)
Physician Discharge Summary  Emily Duarte KGM:[id number] DOB: [birth year] DOA: 08/17/2017  PCP: Nolene Ebbs, MD  Admit date: 08/17/2017 Discharge date: 08/20/2017  Time spent: 35 minutes  Recommendations for Outpatient Follow-up:  1. Continue care at [facility]  2. Continue steroids    Discharge Diagnoses:  Principal Problem:   Schizoaffective disorder, depressive type ([place]) Active Problems:   Dehydration   Schizophrenia (HCC)   SIRS (systemic inflammatory response syndrome) (HCC)   Discharge Condition: Good  Diet recommendation: regular   Filed Weights   08/18/17 0538 08/19/17 0557 08/20/17 0528  Weight: 71.2 kg (156 lb 15.5 oz) 72.7 kg (160 lb 4.4 oz) 72.6 kg (160 lb 0.9 oz)    History of present illness:  Emily Duarte is a 66 y.o. female with Past medical history of endometrial cancer, hypertension, schizophrenia, left arm DVT. Patient was brought in by EMS.  Home health nurse arrived at patient's home today, found that her blood pressure was in 70s and her heart rate was 122 and recommended that the patient should be sent to ER for further evaluation. There was no report of fever, chills, chest pain, abdominal pain, diarrhea, nausea, vomiting.  Patient denies any burning urination or blood in the urine. Son reported that after going home from her recent hospitalization patient was acting delusional and also had hallucination (she was seeing things that were not there). Son reports that the patient has been emptying her pillbox but he has not verified whether the patient is taking this medications are not. Patient subsequently diagnosed with secondary adrenal insufficiency as well as symptomatic anemia   Hospital Course:  #1 SIRS: Presumably secondary to adrenal insufficiency. Cortisol level was 4.8. Patient was aggressively hydrated. Started on Decadron and she is improving. Continue Decadron at the skilled facility.  #2 schizophrenia with paranoid  behavior: Patient maintain on home regimen. Stable at this point  #3 history of left leg DVT: Patient has been maintained on Xarelto  #4 anemia of chronic disease with symptoms: Second to chronic kidney disease stage III. Hemoglobin was down to 6.5. Patient transfused 2 units of packed red blood cells and hemoglobin is up to 10 today.  #5 chronic kidney disease to 3: Patient remained at baseline.  #6 acute urinary retention: Patient was tried on Foley catheter which has not been removed prior to discharge. This was possibly secondary to medications.  #7 bilateral pleural effusions: This is chronic. Patient is currently is symptomatic. Treated with supportive care.  #8 hypoalbuminemia: Low albumen secondary to poor dietary intake. Encourage patient to eat more.  #9 constipation: Patient on stool soft and is.  #10 generalized: Physical therapy and occupational therapy has followed of patient recommendations for skilled nursing facility.  Procedures: None  Consultations:  Florentina Addison, Psychiatry  Discharge Exam: Vitals:   08/19/17 2145 08/20/17 0528  BP: (!) 150/97 (!) 153/93  Pulse: 92 84  Resp: (!) 21 20  Temp: 98 F (36.7 C) (!) 97.4 F (36.3 C)  SpO2: 97% 94%    General: Stable, NAD Cardiovascular: RRR Respiratory: Good AE bilaterally  Discharge Instructions   Discharge Instructions    Diet - low sodium heart healthy   Complete by:  As directed    Increase activity slowly   Complete by:  As directed      Allergies as of 08/20/2017   No Known Allergies     Medication List    TAKE these medications   dexamethasone 4 MG tablet Commonly known as:  DECADRON Take 1 tablet (4 mg total) by mouth 2 (two) times daily with a meal.   divalproex 500 MG 24 hr tablet Commonly known as:  DEPAKOTE ER Take 500 mg by mouth at bedtime.   feeding supplement (ENSURE ENLIVE) Liqd Take 237 mLs by mouth 2 (two) times daily between meals.   HYDROcodone-acetaminophen  10-325 MG tablet Commonly known as:  NORCO Take 1 tablet by mouth every 4 (four) hours as needed. What changed:  reasons to take this   magnesium oxide 400 (241.3 Mg) MG tablet Commonly known as:  MAG-OX Take 1 tablet (400 mg total) by mouth 2 (two) times daily.   ondansetron 8 MG tablet Commonly known as:  ZOFRAN Take 1 tablet (8 mg total) by mouth every 8 (eight) hours as needed for nausea.   potassium chloride 10 MEQ tablet Commonly known as:  K-DUR,KLOR-CON Take 1 tablet (10 mEq total) by mouth daily.   prochlorperazine 10 MG tablet Commonly known as:  COMPAZINE Take 1 tablet (10 mg total) by mouth every 6 (six) hours as needed for nausea or vomiting.   risperiDONE 1 MG tablet Commonly known as:  RISPERDAL Take 1 mg by mouth daily.   rivaroxaban 20 MG Tabs tablet Commonly known as:  XARELTO Take 1 tablet (20 mg total) by mouth daily with supper.      No Known Allergies Contact information for after-discharge care    Destination    [facility] .   Service:  Skilled Nursing Contact information: [address] [place] [phone]               The results of significant diagnostics from this hospitalization (including imaging, microbiology, ancillary and laboratory) are listed below for reference.    Significant Diagnostic Studies: Dg Chest 2 View  Result Date: 08/17/2017 CLINICAL DATA:  Hypotension today. Undergoing chemotherapy for endometrial cancer. EXAM: CHEST - 2 VIEW COMPARISON:  08/11/2017 FINDINGS: Right IJ Port-A-Cath is present with tip over the right atrium unchanged. Patient is rotated to the left. Lungs are adequately inflated with hazy opacification over the right mid to lower lung slightly worse compatible with moderate size effusion with associated atelectasis. Slight worsened left base opacification compatible with small effusion with atelectasis. Cardiomediastinal silhouette and remainder  the exam is unchanged. IMPRESSION: Worsening moderate size right effusion and worsening small left effusion likely with associated bibasilar atelectasis. Right IJ Port-A-Cath unchanged with tip over the right atrium. Electronically Signed   By: Marin Olp M.D.   On: 08/17/2017 16:06   Dg Chest Port 1 View  Result Date: 08/11/2017 CLINICAL DATA:  Generalized weakness, shortness of breath. EXAM: PORTABLE CHEST 1 VIEW COMPARISON:  Radiograph of May 05, 2017. FINDINGS: The heart size and mediastinal contours are within normal limits. No pneumothorax is noted. Left lung is clear. Right internal jugular Port-A-Cath is unchanged in position. Mild right basilar atelectasis is noted with associated pleural effusion. The visualized skeletal structures are unremarkable. IMPRESSION: Mild right basilar subsegmental atelectasis is noted with associated pleural effusion. Electronically Signed   By: Marijo Conception, M.D.   On: 08/11/2017 14:00   Dg Abd Portable 1v  Result Date: 08/17/2017 CLINICAL DATA:  Abdominal distention. EXAM: PORTABLE ABDOMEN - 1 VIEW COMPARISON:  CT abdomen pelvis dated June 20, 2017. FINDINGS: The bowel gas pattern is normal. Moderate colonic stool burden, unchanged. No radio-opaque calculi or other significant radiographic abnormality are seen. Multiple phleboliths in the pelvis. No acute  osseous abnormality. IMPRESSION: 1. Prominent stool throughout the colon favors constipation. No obstruction. Electronically Signed   By: Titus Dubin M.D.   On: 08/17/2017 17:44    Microbiology: Recent Results (from the past 240 hour(s))  Urine Culture     Status: Abnormal   Collection Time: 08/11/17  1:24 PM  Result Value Ref Range Status   Specimen Description   Final    URINE, RANDOM Performed at [facility] [address], [place], [place] [address]    Special Requests   Final    NONE Performed at [facility], [place] [address] [place],  [place] [address]    Culture MULTIPLE SPECIES PRESENT, SUGGEST RECOLLECTION (A)  Final   Report Status 08/13/2017 FINAL  Final  Blood Culture (routine x 2)     Status: None (Preliminary result)   Collection Time: 08/17/17  2:33 PM  Result Value Ref Range Status   Specimen Description   Final    BLOOD LEFT ANTECUBITAL Performed at [facility] [address], [place], [place] [address]    Special Requests   Final    BOTTLES DRAWN AEROBIC AND ANAEROBIC Blood Culture adequate volume Performed at [facility] [address], [place], [place] [address]    Culture   Final    NO GROWTH 2 DAYS Performed at [facility] [address], [place], [place] [address]    Report Status PENDING  Incomplete  Blood Culture (routine x 2)     Status: None (Preliminary result)   Collection Time: 08/17/17  2:45 PM  Result Value Ref Range Status   Specimen Description   Final    BLOOD PORTA CATH Performed at [facility] [address], [place], [place] [address]    Special Requests   Final    BOTTLES DRAWN AEROBIC AND ANAEROBIC Blood Culture results may not be optimal due to an excessive volume of blood received in culture bottles Performed at [facility] [address], [place], [place] [address]    Culture   Final    NO GROWTH 2 DAYS Performed at [facility] [address], [place], [place] [address]    Report Status PENDING  Incomplete  Urine culture     Status: None   Collection Time: 08/17/17  5:00 PM  Result Value Ref Range Status   Specimen Description   Final    URINE, RANDOM Performed at [facility] [address], [place], [place] [address]    Special Requests   Final    NONE Performed at [facility], [place] [address], [place], [place] [address]    Culture   Final    NO GROWTH Performed at [facility], [place] [address], [place],  [address]     Report Status 08/19/2017 FINAL  Final     Labs: Basic Metabolic Panel: Recent Labs  Lab 08/15/17 2207 08/17/17 1350 08/18/17 0921 08/19/17 0550 08/20/17 0457  NA 135 138 140 141 138  K 4.4 3.7 4.0 4.3 4.8  CL 97* 98* 107 106 104  CO2 23 28 24 25 23   GLUCOSE 120* 100* 140* 107* 95  BUN 34* 31* 26* 31* 33*  CREATININE 1.52* 1.39* 1.22* 1.15* 1.17*  CALCIUM 8.4* 8.6* 7.8* 7.9* 8.0*  MG  --   --   --  1.4*  --    Liver Function Tests: Recent Labs  Lab  08/15/17 2207 08/17/17 1350 08/18/17 0921  AST 37 32 34  ALT 22 19 19   ALKPHOS 127* 114 100  BILITOT 0.1* 0.3 0.3  PROT 5.8* 5.9* 5.4*  ALBUMIN 2.0* 1.8* 1.6*   No results for input(s): LIPASE, AMYLASE in the last 168 hours. No results for input(s): AMMONIA in the last 168 hours. CBC: Recent Labs  Lab 08/17/17 1350 08/18/17 0921 08/19/17 0550 08/19/17 0732 08/19/17 2342 08/20/17 0457  WBC 12.1* 13.5* 13.6* 15.5* 16.0* 17.0*  NEUTROABS 7.1 12.2*  --   --   --   --   HGB 9.1* 7.2* 6.5* 7.0* 9.7* 10.1*  HCT 28.7* 22.6* 20.2* 21.7* 30.2* 30.5*  MCV 90.0 90.4 91.4 91.2 88.0 87.1  PLT 324 267 279 306 257 257   Cardiac Enzymes: No results for input(s): CKTOTAL, CKMB, CKMBINDEX, TROPONINI in the last 168 hours. BNP: BNP (last 3 results) No results for input(s): BNP in the last 8760 hours.  ProBNP (last 3 results) No results for input(s): PROBNP in the last 8760 hours.  CBG: No results for input(s): GLUCAP in the last 168 hours.     SignedBarbette Merino MD.  [facility] 08/20/2017, 10:42 AM

## 2017-08-20 NOTE — Progress Notes (Signed)
Physical Therapy Treatment Patient Details Name: Emily Duarte MRN: [id number] DOB: Feb 01, 1951 Today's Date: 08/20/2017    History of Present Illness 67 yo female admitted with dehydration, SIRS. Hx of endometrial ca, schizophrenia, L UE DVT, paranoid schizophrenia    PT Comments    Pt assisted OOB and over to recliner today.  Pt reports increased weakness and seemed anxious with sitting up in recliner.  Encouraged OOB for at least an hour and notified RN to check on pt.  Continue to recommend d/c plan for SNF.   Follow Up Recommendations  SNF     Equipment Recommendations  None recommended by PT    Recommendations for Other Services       Precautions / Restrictions Precautions Precautions: Fall    Mobility  Bed Mobility Overal bed mobility: Needs Assistance Bed Mobility: Supine to Sit     Supine to sit: Min assist     General bed mobility comments: assist for trunk upright  Transfers Overall transfer level: Needs assistance Equipment used: Rolling walker (2 wheeled) Transfers: Sit to/from Stand Sit to Stand: Mod assist;+2 physical assistance         General transfer comment: verbal cues for safe technique, unable to stand pt +1 so requested NT in to assist for safety, assist for rise and steady as well as control descent, step by step multimodal cues for small steps over to recliner  Ambulation/Gait                 Stairs             Wheelchair Mobility    Modified Rankin (Stroke Patients Only)       Balance Overall balance assessment: Needs assistance Sitting-balance support: No upper extremity supported;Feet supported Sitting balance-Leahy Scale: Fair     Standing balance support: Bilateral upper extremity supported;During functional activity Standing balance-Leahy Scale: Poor                              Cognition Arousal/Alertness: Awake/alert Behavior During Therapy: Anxious Overall Cognitive Status: No  family/caregiver present to determine baseline cognitive functioning                                 General Comments: oriented to situation, but some decreased problem solving       Exercises General Exercises - Lower Extremity Ankle Circles/Pumps: AROM;Both;Seated;10 reps Short Arc Quad: AROM;10 reps;Seated;Both Hip Flexion/Marching: AROM;10 reps;Both;Seated    General Comments        Pertinent Vitals/Pain Pain Assessment: No/denies pain Pain Intervention(s): Repositioned    Home Living                      Prior Function            PT Goals (current goals can now be found in the care plan section) Progress towards PT goals: Progressing toward goals    Frequency    Min 3X/week      PT Plan Current plan remains appropriate    Co-evaluation              AM-PAC PT "6 Clicks" Daily Activity  Outcome Measure  Difficulty turning over in bed (including adjusting bedclothes, sheets and blankets)?: A Lot Difficulty moving from lying on back to sitting on the side of the bed? : Unable Difficulty sitting down on and standing up  from a chair with arms (Duarte.g., wheelchair, bedside commode, etc,.)?: Unable Help needed moving to and from a bed to chair (including a wheelchair)?: A Lot Help needed walking in hospital room?: Total Help needed climbing 3-5 steps with a railing? : Total 6 Click Score: 8    End of Session Equipment Utilized During Treatment: Gait belt Activity Tolerance: Patient limited by fatigue Patient left: with call bell/phone within reach;in chair Nurse Communication: Mobility status(NT assisted with transfer) PT Visit Diagnosis: Muscle weakness (generalized) (M62.81);Difficulty in walking, not elsewhere classified (R26.2)     Time: 0037-0488 PT Time Calculation (min) (ACUTE ONLY): 14 min  Charges:  $Therapeutic Activity: 8-22 mins                    G Codes:       Emily Duarte, PT, DPT 08/20/2017 Pager:  [phone]  Emily Duarte 08/20/2017, 1:01 PM

## 2017-08-21 ENCOUNTER — Non-Acute Institutional Stay (SKILLED_NURSING_FACILITY): Payer: Medicare Other | Admitting: Adult Health

## 2017-08-21 ENCOUNTER — Encounter: Payer: Self-pay | Admitting: Adult Health

## 2017-08-21 DIAGNOSIS — D638 Anemia in other chronic diseases classified elsewhere: Secondary | ICD-10-CM | POA: Diagnosis not present

## 2017-08-21 DIAGNOSIS — E876 Hypokalemia: Secondary | ICD-10-CM | POA: Diagnosis not present

## 2017-08-21 DIAGNOSIS — G893 Neoplasm related pain (acute) (chronic): Secondary | ICD-10-CM

## 2017-08-21 DIAGNOSIS — J90 Pleural effusion, not elsewhere classified: Secondary | ICD-10-CM

## 2017-08-21 DIAGNOSIS — D72829 Elevated white blood cell count, unspecified: Secondary | ICD-10-CM | POA: Diagnosis not present

## 2017-08-21 DIAGNOSIS — N183 Chronic kidney disease, stage 3 unspecified: Secondary | ICD-10-CM | POA: Insufficient documentation

## 2017-08-21 DIAGNOSIS — F251 Schizoaffective disorder, depressive type: Secondary | ICD-10-CM | POA: Diagnosis not present

## 2017-08-21 NOTE — Progress Notes (Signed)
Location:   [facility] Room Number: 116 A Place of Service:  SNF (31)   CODE STATUS: Full Code  No Known Allergies  Chief Complaint  Patient presents with  . Hospitalization Follow-up    Hospital follow up    HPI:  She is a 67 year old with a history of left leg dvt; endometrial cancer; schizophrenia who has been hospitalized from from 08-17-17 through 08-21-17. She was admitted and treated for SIRS presumably secondary to adrenal insuffiencey being treated with decadron; anemia of chronic disease which required  A transfusion of 2 units PRBCs. She is here for short term rehab with her goal to return back home. She does tell me that she feels weak and tired; there are no reports of fevers present no reports of anxiety present. There are no nursing concerns at this time. She will continue to followed for her chronic illnesses including: schizophrenia with paranoid behavior; anemia; ckd.    Past Medical History:  Diagnosis Date  . Endometrial cancer ([place])   . Hemorrhoid   . Hypertension   . Paranoid schizophrenia ([place])   . Snake bite     Past Surgical History:  Procedure Laterality Date  . ENDOMETRIAL BIOPSY  08/11/2016  . IR FLUORO GUIDE PORT INSERTION RIGHT  09/05/2016  . IR US GUIDE VASC ACCESS RIGHT  09/05/2016  . TUBAL LIGATION      Social History   Socioeconomic History  . Marital status: Divorced    Spouse name: Not on file  . Number of children: 1  . Years of education: Not on file  . Highest education level: Not on file  Occupational History  . Occupation: Retired  Scientific laboratory technician  . Financial resource strain: Not on file  . Food insecurity:    Worry: Not on file    Inability: Not on file  . Transportation needs:    Medical: Not on file    Non-medical: Not on file  Tobacco Use  . Smoking status: Never Smoker  . Smokeless tobacco: Never Used  Substance and Sexual Activity  . Alcohol use: No  . Drug use: No  . Sexual activity: Never    Lifestyle  . Physical activity:    Days per week: Not on file    Minutes per session: Not on file  . Stress: Not on file  Relationships  . Social connections:    Talks on phone: Not on file    Gets together: Not on file    Attends religious service: Not on file    Active member of club or organization: Not on file    Attends meetings of clubs or organizations: Not on file    Relationship status: Not on file  . Intimate partner violence:    Fear of current or ex partner: Not on file    Emotionally abused: Not on file    Physically abused: Not on file    Forced sexual activity: Not on file  Other Topics Concern  . Not on file  Social History Narrative  . Not on file   Family History  Problem Relation Age of Onset  . Colon cancer Mother        late 23s  . Schizophrenia Sister   . Colon cancer Sister        late 80 s      VITAL SIGNS BP 123/82   Pulse (!) 106   Temp 97.9 F (36.6 C)   Resp 20   Ht  5\' 4"  (1.626 m)   Wt 158 lb 9.6 oz (71.9 kg)   SpO2 97%   BMI 27.22 kg/m   Outpatient Encounter Medications as of 08/21/2017  Medication Sig  . dexamethasone (DECADRON) 4 MG tablet Take 1 tablet (4 mg total) by mouth 2 (two) times daily with a meal.  . divalproex (DEPAKOTE ER) 500 MG 24 hr tablet Take 500 mg by mouth at bedtime.  . feeding supplement, ENSURE ENLIVE, (ENSURE ENLIVE) LIQD Take 237 mLs by mouth 2 (two) times daily between meals.  Marland Kitchen HYDROcodone-acetaminophen (NORCO) 10-325 MG tablet Take 1 tablet by mouth every 4 (four) hours as needed.  . magnesium oxide (MAG-OX) 400 (241.3 Mg) MG tablet Take 1 tablet (400 mg total) by mouth 2 (two) times daily.  . ondansetron (ZOFRAN) 8 MG tablet Take 1 tablet (8 mg total) by mouth every 8 (eight) hours as needed for nausea.  . potassium chloride SA (K-DUR,KLOR-CON) 10 MEQ tablet Take 1 tablet (10 mEq total) by mouth daily.  . prochlorperazine (COMPAZINE) 10 MG tablet Take 1 tablet (10 mg total) by mouth every 6 (six) hours  as needed for nausea or vomiting.  . risperiDONE (RISPERDAL) 1 MG tablet Take 1 mg by mouth daily.  . rivaroxaban (XARELTO) 20 MG TABS tablet Take 1 tablet (20 mg total) by mouth daily with supper.   No facility-administered encounter medications on file as of 08/21/2017.      SIGNIFICANT DIAGNOSTIC EXAMS  TODAY:   06-20-17: ct of chest abdomen and pelvis: 1. Slightly improved omental disease. Scattered peritoneal surface disease and mesenteric disease appears relatively stable. 2. New cystic lesion peripherally in the right hepatic lobe could be treated peritoneal surface disease. 3. Persistent extensive endometrial neoplasm involving the lower uterine segment and cervix. 4. Cholelithiasis. 5. Moderate-sized right effusion with overlying atelectasis but no enhancing pleural nodules or pulmonary nodules. 6. Deep venous thrombosis in the left common femoral vein.  08-11-17: chest x-ray: Mild right basilar subsegmental atelectasis is noted with associated pleural effusion.  08-17-17: chest x-ray: Worsening moderate size right effusion and worsening small left effusion likely with associated bibasilar atelectasis. Right IJ Port-A-Cath unchanged with tip over the right atrium.   08-17-17: kub: . Prominent stool throughout the colon favors constipation. No obstruction.   LABS REVIEWED TODAY:   08-17-17: wbc 12.1; hgb 9.1; hct 28,7 mcv 90.0 plt 324; glucose 100; bun 31; creat 1.39; k+ 3.7; na++ 138; ca 8.6; liver normal albumin 1.8; blood and urine culture: no growth 08-18-17: cortisol 4.8 (low) 08-19-17: wbc 15.5; hgb 7.0; hct 21.7; mcv 91.2; plt 306; glucose 107; bun 31; creat 1.15; k+ 4.3; na++ 141; ca 7.9; mag 1.4 08-20-17: wbc 17.0; hgb 10.1; hct 30.5; mcv 87.1 ;plt 257; glucose 95; bun 33; creat 1.17; k+ 4.8; na++ 138; ca 8.0    Review of Systems  Constitutional: Positive for malaise/fatigue.  Respiratory: Negative for cough and shortness of breath.   Cardiovascular: Negative for chest  pain, palpitations and leg swelling.  Gastrointestinal: Negative for abdominal pain, constipation and heartburn.  Musculoskeletal: Negative for back pain, joint pain and myalgias.  Skin: Negative.   Neurological: Negative for dizziness.  Psychiatric/Behavioral: The patient is not nervous/anxious.     Physical Exam  Constitutional: She appears well-developed and well-nourished. No distress.  HENT:  Is bald scalp skin is dry and flaky   Neck: No thyromegaly present.  Cardiovascular: Normal rate, regular rhythm, normal heart sounds and intact distal pulses.  Pulmonary/Chest: Effort normal and breath sounds  normal. No stridor. No respiratory distress.  Abdominal: Soft. Bowel sounds are normal. She exhibits no distension. There is no tenderness.  Musculoskeletal: She exhibits no edema.  Is able to move all extremities Is extremely weak   Lymphadenopathy:    She has no cervical adenopathy.  Neurological: She is alert.  Skin: Skin is warm and dry. She is not diaphoretic.  Right chest wall portacath  Has skin discoloration on chest wall   Psychiatric: She has a normal mood and affect.    ASSESSMENT/ PLAN:  TODAY:  1. Left femoral vein DVT: stable will continue xarelto 20 mg daily   2. Schizoaffective disorder; depressive type: is presently stable will continue risperdal 1 mg daily and depakote ER 500 mg nightly   3. Hypomagnesemia: mag level 1.4; will continue mag ox 400 mg twice daily and will check level  4. Hypokalemia: k+ 4.8 stable will continue k+ 10 meq daily  5. Nausea and vomiting: is followed by oncology; will continue decadron 4 mg twice daily    6. Cancer associated pain: is being managed will continue vicodin 10/325 mg every 4 hours as needed  7. Protein calorie malnutrition, severe: weight is 158 pounds; albumin is 1.8; will continue supplements as directed  8.  Endometrial/uterine adenocarcinoma: stage III (cT3, Cn1a): is being followed by oncology; is on  chemotherapy as directed; does have some vaginal bleeding present. Will monitor  9. Anemia of chronic disease: stable hgb 10.1: is status post 2 u PRBCs transfusion. Will monitor  10. SIRS: is stable possibly due to adrenal insufficiency: will continue decadron 4 mg twice daily   11. Leukocytosis: without change wbc 17.0; will monitor  12. CKD stage 3: stable bun 33 creat 1.17  13. Chronic bilateral pleural effusion: is stable without symptoms will monitor   Will check cbc; bmp mag level.   MD is aware of resident's narcotic use and is in agreement with current plan of care. We will attempt to wean resident as apropriate   Ok Edwards NP [facility]  Contact [phone] Monday through Friday 8am- 5pm  After hours call [phone]

## 2017-08-22 ENCOUNTER — Inpatient Hospital Stay (HOSPITAL_COMMUNITY)
Admission: EM | Admit: 2017-08-22 | Discharge: 2017-08-31 | DRG: 314 | Disposition: A | Discharge status: Skilled Nursing Facility | Payer: Medicare Other | Attending: Internal Medicine | Admitting: Internal Medicine

## 2017-08-22 ENCOUNTER — Other Ambulatory Visit: Payer: Self-pay

## 2017-08-22 ENCOUNTER — Encounter (HOSPITAL_COMMUNITY): Payer: Self-pay

## 2017-08-22 DIAGNOSIS — F200 Paranoid schizophrenia: Secondary | ICD-10-CM

## 2017-08-22 DIAGNOSIS — Z800 Family history of malignant neoplasm of digestive organs: Secondary | ICD-10-CM | POA: Diagnosis not present

## 2017-08-22 DIAGNOSIS — Z6827 Body mass index (BMI) 27.0-27.9, adult: Secondary | ICD-10-CM | POA: Diagnosis not present

## 2017-08-22 DIAGNOSIS — B379 Candidiasis, unspecified: Secondary | ICD-10-CM | POA: Diagnosis not present

## 2017-08-22 DIAGNOSIS — I82412 Acute embolism and thrombosis of left femoral vein: Secondary | ICD-10-CM | POA: Diagnosis not present

## 2017-08-22 DIAGNOSIS — L8990 Pressure ulcer of unspecified site, unspecified stage: Secondary | ICD-10-CM

## 2017-08-22 DIAGNOSIS — B377 Candidal sepsis: Secondary | ICD-10-CM | POA: Diagnosis present

## 2017-08-22 DIAGNOSIS — L89302 Pressure ulcer of unspecified buttock, stage 2: Secondary | ICD-10-CM | POA: Diagnosis present

## 2017-08-22 DIAGNOSIS — Z7952 Long term (current) use of systemic steroids: Secondary | ICD-10-CM

## 2017-08-22 DIAGNOSIS — Z978 Presence of other specified devices: Secondary | ICD-10-CM

## 2017-08-22 DIAGNOSIS — T380X5A Adverse effect of glucocorticoids and synthetic analogues, initial encounter: Secondary | ICD-10-CM

## 2017-08-22 DIAGNOSIS — D630 Anemia in neoplastic disease: Secondary | ICD-10-CM | POA: Diagnosis not present

## 2017-08-22 DIAGNOSIS — Y92009 Unspecified place in unspecified non-institutional (private) residence as the place of occurrence of the external cause: Secondary | ICD-10-CM

## 2017-08-22 DIAGNOSIS — C541 Malignant neoplasm of endometrium: Secondary | ICD-10-CM | POA: Diagnosis present

## 2017-08-22 DIAGNOSIS — E43 Unspecified severe protein-calorie malnutrition: Secondary | ICD-10-CM | POA: Diagnosis present

## 2017-08-22 DIAGNOSIS — R21 Rash and other nonspecific skin eruption: Secondary | ICD-10-CM | POA: Diagnosis not present

## 2017-08-22 DIAGNOSIS — R350 Frequency of micturition: Secondary | ICD-10-CM

## 2017-08-22 DIAGNOSIS — R32 Unspecified urinary incontinence: Secondary | ICD-10-CM | POA: Diagnosis not present

## 2017-08-22 DIAGNOSIS — I82512 Chronic embolism and thrombosis of left femoral vein: Secondary | ICD-10-CM | POA: Diagnosis present

## 2017-08-22 DIAGNOSIS — Z7901 Long term (current) use of anticoagulants: Secondary | ICD-10-CM | POA: Diagnosis not present

## 2017-08-22 DIAGNOSIS — T80211A Bloodstream infection due to central venous catheter, initial encounter: Principal | ICD-10-CM | POA: Diagnosis present

## 2017-08-22 DIAGNOSIS — Z818 Family history of other mental and behavioral disorders: Secondary | ICD-10-CM | POA: Diagnosis not present

## 2017-08-22 DIAGNOSIS — E860 Dehydration: Secondary | ICD-10-CM | POA: Diagnosis present

## 2017-08-22 DIAGNOSIS — Z170 Estrogen receptor positive status [ER+]: Secondary | ICD-10-CM | POA: Diagnosis not present

## 2017-08-22 DIAGNOSIS — N183 Chronic kidney disease, stage 3 (moderate): Secondary | ICD-10-CM | POA: Diagnosis present

## 2017-08-22 DIAGNOSIS — F329 Major depressive disorder, single episode, unspecified: Secondary | ICD-10-CM | POA: Diagnosis present

## 2017-08-22 DIAGNOSIS — Y848 Other medical procedures as the cause of abnormal reaction of the patient, or of later complication, without mention of misadventure at the time of the procedure: Secondary | ICD-10-CM

## 2017-08-22 DIAGNOSIS — I129 Hypertensive chronic kidney disease with stage 1 through stage 4 chronic kidney disease, or unspecified chronic kidney disease: Secondary | ICD-10-CM | POA: Diagnosis present

## 2017-08-22 DIAGNOSIS — E2740 Unspecified adrenocortical insufficiency: Secondary | ICD-10-CM | POA: Diagnosis not present

## 2017-08-22 DIAGNOSIS — D631 Anemia in chronic kidney disease: Secondary | ICD-10-CM | POA: Diagnosis present

## 2017-08-22 DIAGNOSIS — Z9221 Personal history of antineoplastic chemotherapy: Secondary | ICD-10-CM | POA: Diagnosis not present

## 2017-08-22 DIAGNOSIS — G893 Neoplasm related pain (acute) (chronic): Secondary | ICD-10-CM | POA: Diagnosis present

## 2017-08-22 DIAGNOSIS — H40059 Ocular hypertension, unspecified eye: Secondary | ICD-10-CM | POA: Diagnosis present

## 2017-08-22 DIAGNOSIS — D72829 Elevated white blood cell count, unspecified: Secondary | ICD-10-CM | POA: Diagnosis not present

## 2017-08-22 DIAGNOSIS — D649 Anemia, unspecified: Secondary | ICD-10-CM | POA: Diagnosis not present

## 2017-08-22 DIAGNOSIS — Z79899 Other long term (current) drug therapy: Secondary | ICD-10-CM

## 2017-08-22 DIAGNOSIS — A419 Sepsis, unspecified organism: Secondary | ICD-10-CM | POA: Diagnosis not present

## 2017-08-22 DIAGNOSIS — B49 Unspecified mycosis: Secondary | ICD-10-CM | POA: Diagnosis present

## 2017-08-22 DIAGNOSIS — E46 Unspecified protein-calorie malnutrition: Secondary | ICD-10-CM | POA: Diagnosis not present

## 2017-08-22 DIAGNOSIS — N179 Acute kidney failure, unspecified: Secondary | ICD-10-CM | POA: Diagnosis not present

## 2017-08-22 DIAGNOSIS — B488 Other specified mycoses: Secondary | ICD-10-CM | POA: Diagnosis not present

## 2017-08-22 DIAGNOSIS — T80219A Unspecified infection due to central venous catheter, initial encounter: Secondary | ICD-10-CM | POA: Diagnosis not present

## 2017-08-22 LAB — BASIC METABOLIC PANEL
Anion gap: 9 (ref 5–15)
BUN: 30 mg/dL — ABNORMAL HIGH (ref 6–20)
CALCIUM: 8.2 mg/dL — AB (ref 8.9–10.3)
CO2: 27 mmol/L (ref 22–32)
Chloride: 101 mmol/L (ref 101–111)
Creatinine, Ser: 1.07 mg/dL — ABNORMAL HIGH (ref 0.44–1.00)
GFR calc Af Amer: >60 mL/min (ref >60)
GFR calc non Af Amer: 52 mL/min — ABNORMAL LOW (ref >60)
GLUCOSE: 111 mg/dL — AB (ref 65–99)
POTASSIUM: 4.3 mmol/L (ref 3.5–5.1)
Sodium: 137 mmol/L (ref 135–145)

## 2017-08-22 LAB — CBC WITH DIFFERENTIAL/PLATELET
BAND NEUTROPHILS: 0 %
BASOS PCT: 0 %
Basophils Absolute: 0 10*3/uL (ref 0.0–0.1)
Blasts: 0 %
EOS ABS: 0 10*3/uL (ref 0.0–0.7)
Eosinophils Relative: 0 %
HCT: 31.4 % — ABNORMAL LOW (ref 36.0–46.0)
Hemoglobin: 10.2 g/dL — ABNORMAL LOW (ref 12.0–15.0)
LYMPHS PCT: 9 %
Lymphs Abs: 1.7 10*3/uL (ref 0.7–4.0)
MCH: 28.9 pg (ref 26.0–34.0)
MCHC: 32.5 g/dL (ref 30.0–36.0)
MCV: 89 fL (ref 78.0–100.0)
MONO ABS: 0.6 10*3/uL (ref 0.1–1.0)
MONOS PCT: 3 %
Metamyelocytes Relative: 6 %
Myelocytes: 20 %
NEUTROS ABS: 16.1 10*3/uL — AB (ref 1.7–7.7)
Neutrophils Relative %: 62 %
OTHER: 0 %
Platelets: 269 10*3/uL (ref 150–400)
Promyelocytes Relative: 0 %
RBC: 3.53 MIL/uL — ABNORMAL LOW (ref 3.87–5.11)
RDW: 17.7 % — AB (ref 11.5–15.5)
WBC: 18.4 10*3/uL — ABNORMAL HIGH (ref 4.0–10.5)
nRBC: 0 /100 WBC

## 2017-08-22 LAB — BLOOD CULTURE ID PANEL (REFLEXED)
Acinetobacter baumannii: NOT DETECTED
Candida albicans: NOT DETECTED
Candida glabrata: DETECTED — AB
Candida krusei: NOT DETECTED
Candida parapsilosis: NOT DETECTED
Candida tropicalis: NOT DETECTED
ENTEROCOCCUS SPECIES: NOT DETECTED
Enterobacter cloacae complex: NOT DETECTED
Enterobacteriaceae species: NOT DETECTED
Escherichia coli: NOT DETECTED
HAEMOPHILUS INFLUENZAE: NOT DETECTED
Klebsiella oxytoca: NOT DETECTED
Klebsiella pneumoniae: NOT DETECTED
LISTERIA MONOCYTOGENES: NOT DETECTED
NEISSERIA MENINGITIDIS: NOT DETECTED
Proteus species: NOT DETECTED
Pseudomonas aeruginosa: NOT DETECTED
SERRATIA MARCESCENS: NOT DETECTED
STAPHYLOCOCCUS AUREUS BCID: NOT DETECTED
STREPTOCOCCUS AGALACTIAE: NOT DETECTED
STREPTOCOCCUS PYOGENES: NOT DETECTED
STREPTOCOCCUS SPECIES: NOT DETECTED
Staphylococcus species: NOT DETECTED
Streptococcus pneumoniae: NOT DETECTED

## 2017-08-22 LAB — CULTURE, BLOOD (ROUTINE X 2): Culture: NO GROWTH

## 2017-08-22 LAB — VALPROIC ACID LEVEL: VALPROIC ACID LVL: 14 ug/mL — AB (ref 50.0–100.0)

## 2017-08-22 MED ORDER — SODIUM CHLORIDE 0.9 % IV SOLN
200.0000 mg | Freq: Once | INTRAVENOUS | Status: AC
  Administered 2017-08-22: 200 mg via INTRAVENOUS
  Filled 2017-08-22: qty 200

## 2017-08-22 MED ORDER — ONDANSETRON HCL 4 MG PO TABS: 4.0000 mg | ORAL_TABLET | Freq: Four times a day (QID) | ORAL | Status: DC | PRN

## 2017-08-22 MED ORDER — ENOXAPARIN SODIUM 40 MG/0.4ML ~~LOC~~ SOLN
40.0000 mg | SUBCUTANEOUS | Status: DC
  Administered 2017-08-22: 40 mg via SUBCUTANEOUS
  Filled 2017-08-22: qty 0.4

## 2017-08-22 MED ORDER — ACETAMINOPHEN 650 MG RE SUPP: 650.0000 mg | Freq: Four times a day (QID) | RECTAL | Status: DC | PRN

## 2017-08-22 MED ORDER — MAGNESIUM OXIDE 400 (241.3 MG) MG PO TABS
400.0000 mg | ORAL_TABLET | Freq: Two times a day (BID) | ORAL | Status: DC
  Administered 2017-08-22 – 2017-08-31 (×18): 400 mg via ORAL
  Filled 2017-08-22 (×18): qty 1

## 2017-08-22 MED ORDER — ENSURE ENLIVE PO LIQD: 237.0000 mL | Freq: Two times a day (BID) | ORAL | Status: DC

## 2017-08-22 MED ORDER — ONDANSETRON HCL 4 MG/2ML IJ SOLN: 4.0000 mg | Freq: Four times a day (QID) | INTRAMUSCULAR | Status: DC | PRN

## 2017-08-22 MED ORDER — DIVALPROEX SODIUM ER 500 MG PO TB24
500.0000 mg | ORAL_TABLET | Freq: Every day | ORAL | Status: DC
  Administered 2017-08-22 – 2017-08-30 (×9): 500 mg via ORAL
  Filled 2017-08-22 (×9): qty 1

## 2017-08-22 MED ORDER — SODIUM CHLORIDE 0.9 % IV SOLN
100.0000 mg | INTRAVENOUS | Status: DC
  Administered 2017-08-23 – 2017-08-31 (×9): 100 mg via INTRAVENOUS
  Filled 2017-08-22 (×9): qty 100

## 2017-08-22 MED ORDER — RISPERIDONE 1 MG PO TABS
1.0000 mg | ORAL_TABLET | Freq: Every day | ORAL | Status: DC
  Administered 2017-08-23 – 2017-08-31 (×9): 1 mg via ORAL
  Filled 2017-08-22 (×7): qty 1
  Filled 2017-08-22: qty 4
  Filled 2017-08-22: qty 1

## 2017-08-22 MED ORDER — DEXAMETHASONE 4 MG PO TABS
4.0000 mg | ORAL_TABLET | Freq: Two times a day (BID) | ORAL | Status: DC
  Administered 2017-08-22 – 2017-08-28 (×13): 4 mg via ORAL
  Filled 2017-08-22 (×13): qty 1

## 2017-08-22 MED ORDER — ACETAMINOPHEN 325 MG PO TABS: 650.0000 mg | ORAL_TABLET | Freq: Four times a day (QID) | ORAL | Status: DC | PRN

## 2017-08-22 MED ORDER — HYDROCODONE-ACETAMINOPHEN 10-325 MG PO TABS
1.0000 | ORAL_TABLET | ORAL | Status: DC | PRN
  Administered 2017-08-27 – 2017-08-28 (×2): 1 via ORAL
  Filled 2017-08-22 (×3): qty 1

## 2017-08-22 NOTE — ED Notes (Signed)
Report called to floor. Transporter called for transport to the floor.

## 2017-08-22 NOTE — Progress Notes (Signed)
Patient has one positive blood culture for yeast.  Informed [place] RN at [facility] who will notify patient's nursing home provider.  Recommend return to hospital for evaluation and treatment.  Heide Guile, PharmD, BCPS-AQ ID Clinical Pharmacist Pager [phone]

## 2017-08-22 NOTE — ED Notes (Signed)
Pt politely asked if she could stay in her own clothes versus wearing the gown.

## 2017-08-22 NOTE — ED Triage Notes (Signed)
Patient BIB EMS from [facility] (previously [facility]). Patient was called by [facility] this morning to tell her her blood cultures are positive for yeast. Patient AxOx4 and nonambulatory. VSS for EMS.

## 2017-08-22 NOTE — H&P (Addendum)
History and Physical    Emily Duarte ZJQ:[id number] DOB: 24-Aug-1950 DOA: 08/22/2017  PCP: Nolene Ebbs, MD   Patient coming from: Home  Chief Complaint: Positive blood culture.   HPI: Emily Duarte is a 67 y.o. 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  She had a recent hospitalization April 28 to May 1st, 2019 due to hypotension related to adrenal insufficiency, she was discharged in a stable condition on dexamethasone hormone supplemental therapy.  Since her discharge 48 hours ago she has been experiencing significant weakness, moderate to severe in intensity, worse with exertion, no improving factors, associated with poor oral intake, no fevers, no chills, no diarrhea, no nausea or vomiting.  Today her cultures from April 28 tested positive for Candida Glabrata, she was called to come back to the hospital for further therapy.  She does have a right internal jugular vein Port-A-Cath (May 2018), that she used for chemotherapy.  She received carboplatin and Taxol for 6 cycles.  Apparently she tolerated treatment very poorly with progressive weight loss and renal failure.  Currently on daily Doxil no further cisplatin.   ED Course: Patient was found hemodynamically stable, she was promptly started on antifungal IV therapy and referred to hospitalization for further treatment.  Review of Systems:  1. General: No fevers, no chills.  2. ENT: No runny nose or sore throat, no hearing disturbances 3. Pulmonary: No dyspnea, cough, wheezing, or hemoptysis 4. Cardiovascular: No angina, claudication, lower extremity edema, pnd or orthopnea 5. Gastrointestinal: No nausea or vomiting, no diarrhea or constipation 6. Hematology: No easy bruisability or frequent infections 7. Urology: No dysuria, hematuria or increased urinary frequency 8. Dermatology: No rashes. 9. Neurology: No seizures or  paresthesias 10. Musculoskeletal: No joint pain or deformities  Past Medical History:  Diagnosis Date  . Endometrial cancer ([place])   . Hemorrhoid   . Hypertension   . Paranoid schizophrenia ([place])   . Snake bite     Past Surgical History:  Procedure Laterality Date  . ENDOMETRIAL BIOPSY  08/11/2016  . IR FLUORO GUIDE PORT INSERTION RIGHT  09/05/2016  . IR US GUIDE VASC ACCESS RIGHT  09/05/2016  . TUBAL LIGATION       reports that she has never smoked. She has never used smokeless tobacco. She reports that she does not drink alcohol or use drugs.  No Known Allergies  Family History  Problem Relation Age of Onset  . Colon cancer Mother        late 4s  . Schizophrenia Sister   . Colon cancer Sister        late 52 s     Prior to Admission medications   Medication Sig Start Date End Date Taking? Authorizing Provider  dexamethasone (DECADRON) 4 MG tablet Take 1 tablet (4 mg total) by mouth 2 (two) times daily with a meal. 07/11/17   Heath Lark, MD  divalproex (DEPAKOTE ER) 500 MG 24 hr tablet Take 500 mg by mouth at bedtime.    [provider]  feeding supplement, ENSURE ENLIVE, (ENSURE ENLIVE) LIQD Take 237 mLs by mouth 2 (two) times daily between meals. 08/20/17   Elwyn Reach, MD  HYDROcodone-acetaminophen (NORCO) 10-325 MG tablet Take 1 tablet by mouth every 4 (four) hours as needed. 08/20/17   Gerlene Fee, NP  magnesium oxide (MAG-OX) 400 (241.3 Mg) MG tablet Take 1 tablet (400 mg total) by mouth 2 (two) times daily. 06/23/17  Heath Lark, MD  ondansetron (ZOFRAN) 8 MG tablet Take 1 tablet (8 mg total) by mouth every 8 (eight) hours as needed for nausea. 04/10/17   Heath Lark, MD  potassium chloride SA (K-DUR,KLOR-CON) 10 MEQ tablet Take 1 tablet (10 mEq total) by mouth daily. 08/14/17   Lavina Hamman, MD  prochlorperazine (COMPAZINE) 10 MG tablet Take 1 tablet (10 mg total) by mouth every 6 (six) hours as needed for nausea or vomiting. 04/10/17   Heath Lark,  MD  risperiDONE (RISPERDAL) 1 MG tablet Take 1 mg by mouth daily. 05/13/17   [provider]  rivaroxaban (XARELTO) 20 MG TABS tablet Take 1 tablet (20 mg total) by mouth daily with supper. 08/14/17   Lavina Hamman, MD    Physical Exam: Vitals:   08/22/17 1233  BP: 121/81  Pulse: (!) 102  Resp: 18  Temp: 98.1 F (36.7 C)  TempSrc: Oral  SpO2: 97%  Weight: 76.2 kg (168 lb)  Height: 5\' 4"  (1.626 m)    Constitutional: deconditioned and ill looking appearing Vitals:   08/22/17 1233  BP: 121/81  Pulse: (!) 102  Resp: 18  Temp: 98.1 F (36.7 C)  TempSrc: Oral  SpO2: 97%  Weight: 76.2 kg (168 lb)  Height: 5\' 4"  (1.626 m)   Eyes: PERRL, lids and conjunctivae pale.  Head normocephalic, nose and ears with no deformities ENMT: Mucous membranes are dry. Posterior pharynx clear of any exudate or lesions.Normal dentition.  Neck: normal, supple, no masses, no thyromegaly Respiratory: decreased breath sounds on auscultation bilaterally, no wheezing, no crackles. Normal respiratory effort. No accessory muscle use.  Cardiovascular: Regular rate and rhythm, no murmurs / rubs / gallops. No extremity edema. 2+ pedal pulses. No carotid bruits.  Abdomen: protuberant with no tenderness, no masses palpated. No hepatosplenomegaly. Bowel sounds positive.  Musculoskeletal: no clubbing / cyanosis. No joint deformity upper and lower extremities. Good ROM, no contractures. Normal muscle tone.  Skin: no lesions, ulcers. No induration. Upper and lower extremities bilateral ecchymosis.  Neurologic: CN 2-12 grossly intact. Sensation intact, DTR normal. Strength 5/5 in all 4.      Labs on Admission: I have personally reviewed following labs and imaging studies  CBC: Recent Labs  Lab 08/17/17 1350 08/18/17 0921 08/19/17 0550 08/19/17 0732 08/19/17 2342 08/20/17 0457  WBC 12.1* 13.5* 13.6* 15.5* 16.0* 17.0*  NEUTROABS 7.1 12.2*  --   --   --   --   HGB 9.1* 7.2* 6.5* 7.0* 9.7* 10.1*    HCT 28.7* 22.6* 20.2* 21.7* 30.2* 30.5*  MCV 90.0 90.4 91.4 91.2 88.0 87.1  PLT 324 267 279 306 257 638   Basic Metabolic Panel: Recent Labs  Lab 08/15/17 2207 08/17/17 1350 08/18/17 0921 08/19/17 0550 08/20/17 0457  NA 135 138 140 141 138  K 4.4 3.7 4.0 4.3 4.8  CL 97* 98* 107 106 104  CO2 23 28 24 25 23   GLUCOSE 120* 100* 140* 107* 95  BUN 34* 31* 26* 31* 33*  CREATININE 1.52* 1.39* 1.22* 1.15* 1.17*  CALCIUM 8.4* 8.6* 7.8* 7.9* 8.0*  MG  --   --   --  1.4*  --    GFR: Estimated Creatinine Clearance: 46 mL/min (A) (by C-G formula based on SCr of 1.17 mg/dL (H)). Liver Function Tests: Recent Labs  Lab 08/15/17 2207 08/17/17 1350 08/18/17 0921  AST 37 32 34  ALT 22 19 19   ALKPHOS 127* 114 100  BILITOT 0.1* 0.3 0.3  PROT 5.8* 5.9*  5.4*  ALBUMIN 2.0* 1.8* 1.6*   No results for input(s): LIPASE, AMYLASE in the last 168 hours. No results for input(s): AMMONIA in the last 168 hours. Coagulation Profile: Recent Labs  Lab 08/17/17 1350  INR 1.25   Cardiac Enzymes: No results for input(s): CKTOTAL, CKMB, CKMBINDEX, TROPONINI in the last 168 hours. BNP (last 3 results) No results for input(s): PROBNP in the last 8760 hours. HbA1C: No results for input(s): HGBA1C in the last 72 hours. CBG: No results for input(s): GLUCAP in the last 168 hours. Lipid Profile: No results for input(s): CHOL, HDL, LDLCALC, TRIG, CHOLHDL, LDLDIRECT in the last 72 hours. Thyroid Function Tests: No results for input(s): TSH, T4TOTAL, FREET4, T3FREE, THYROIDAB in the last 72 hours. Anemia Panel: No results for input(s): VITAMINB12, FOLATE, FERRITIN, TIBC, IRON, RETICCTPCT in the last 72 hours. Urine analysis:    Component Value Date/Time   COLORURINE YELLOW 08/17/2017 1700   APPEARANCEUR CLEAR 08/17/2017 1700   LABSPEC 1.014 08/17/2017 1700   PHURINE 5.0 08/17/2017 1700   GLUCOSEU NEGATIVE 08/17/2017 1700   HGBUR NEGATIVE 08/17/2017 1700   BILIRUBINUR NEGATIVE 08/17/2017 1700    KETONESUR NEGATIVE 08/17/2017 1700   PROTEINUR NEGATIVE 08/17/2017 1700   UROBILINOGEN 0.2 07/12/2009 1846   NITRITE NEGATIVE 08/17/2017 1700   LEUKOCYTESUR NEGATIVE 08/17/2017 1700    Radiological Exams on Admission: No results found.  EKG: Independently reviewed. NA  Assessment/Plan Active Problems:   Fungemia  67 year old female with a significant history for uterine/endometrial carcinoma, who has a port on the right internal jugular vein, and has responded poorly to chemo therapy regimen.  Recently hospitalization for adrenal insufficiency currently on dexamethasone.  Developed a positive blood culture for Candida Glabrata.  At home with generalized and progressive weakness.  On initial physical examination temperature 91.8, blood pressure 121/81, heart rate 102, respiratory rate 18, oxygen saturation 97% on room air.  Pale conjunctivae, dry mucous membranes, lungs clear to auscultation, heart S1-S2 present and rhythmic, no murmurs, rubs or gallops, the abdomen protuberant nontender, no lower extremity edema.  Sodium 138, potassium 4.8, chloride 104, bicarb 23, glucose 95, BUN 33, creatinine 1.17, white count 17.0, hemoglobin 10.1, hematocrit 30.5, platelets 257.   Patient will be admitted to the hospital with a working diagnosis of fungemia, Candida Glabrata, likely related to right internal jugular vein Port-A-Cath.   1.  Fungemia due to Candida Glabrata, related to right internal jugular vein Port-A-Cath infection, present on admission.  Patient will be admitted to the medical ward, she will be placed on antifungal therapy with IV anidulaungin.  Her Port-A-Cath will need to be removed, she also will need an ophthalmology consultation for discharge.  Case discussed with Dr. Tommy Medal from infectious disease.  2.  Adrenal insufficiency.  Will continue current dose of dexamethasone, no signs of decompensation, no need for stress dose steroids.  3.  Left common femoral vein deep vein  thrombosis (06/2017).  Currently on rivaroxaban, anticoagulation will be held for now, until port can be safely removed.   4.  Endometrial carcinoma complicated with cancer associated pain and severe protein calorie malnutrition.  Continue analgesia, attritional supplements, dietary consult, and physical therapy evaluation.  5.  Depression.  Continue risperidone and Depakote.  No agitation or confusion.   DVT prophylaxis: rivaroxaban (on hold) Code Status:  full  Family Communication: no family at the bedside  Disposition Plan: may need snf  Consults called:  ID, IR. Ophthalmology Dr. Manuella Ghazi).  Admission status: Inpatient.     Aryona Sill  Gerome Apley MD [facility] Pager [phone]  If 7PM-7AM, please contact night-coverage [email or web address] Password [email or web address]  08/22/2017, 1:09 PM

## 2017-08-22 NOTE — Consult Note (Signed)
Date of Admission:  08/22/2017          Reason for Consult:  Candidemia    Referring Provider: "Auto-consult" and Dr. Cathlean Sauer   Assessment: 1. 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 Endmetrial cancer 3. Paranoid schizophrenia  Plan: 1. Start Eraxis 2. Ask SURGERY VS IR to remove her PORTA A CATH as soon as possible, today or tomorrow at latest 3. Repeat blood cultures AFTER PORT removed 4. Have Ophthalmology see the patient Monday or Tuesday for dilated fundoscopic exam to exclude fungal endophthalmitis 5. She should HAVE NO CENTRAL LINES UNTIL WE HAVE PROVEN CLEARANCE OF FUNGEMIA 6.  would HOLD CHEMO until she has completed therapy which will be 2 weeks from date of first negative blood cultures  Active Problems:   Fungemia   Scheduled Meds: Continuous Infusions: . [START ON 08/23/2017] anidulafungin    . anidulafungin     PRN Meds:.  HPI: Emily Duarte is a 67 y.o. female w history of endometrial cancer, schizophrenia who is on chemotherapy and followed here by Dr. Alvy Bimler at [facility]. She was recently admitted with fevers, tachycardia and dehydration thought to be due to UTI. She did have incontinence but not much symptoms specific to bladder. She had pyuria but urine grew mixed flora. Blood cultures drawn during that admission have turned positive for C. Glabrata in one of 2. Blood cultures drawn. Curiously [facility] did not appear to have fired  During that hospitalization she was seen by Psychiatry because she was hving delusiosn and hallucinations. I am wondering if this could have been complicated by ongoing untreated fungemia. Fungemias REQUIRE central line removal and catheter holiday to be cured.  Dr. Megan Salon is available this weekend and will check in on her progress.   Review of Systems: Review of Systems  Constitutional: Positive for chills, fever and malaise/fatigue.  HENT: Negative for hearing loss and sinus pain.   Eyes: Negative  for blurred vision, double vision, photophobia, pain, discharge and redness.  Respiratory: Negative for cough, hemoptysis and shortness of breath.   Cardiovascular: Negative for chest pain, palpitations and orthopnea.  Genitourinary: Positive for frequency. Negative for dysuria.  Musculoskeletal: Negative for back pain, myalgias and neck pain.  Skin: Positive for rash.  Neurological: Positive for dizziness and weakness.  Endo/Heme/Allergies: Bruises/bleeds easily.  Psychiatric/Behavioral: Positive for depression and hallucinations.    Past Medical History:  Diagnosis Date  . Endometrial cancer ([place])   . Hemorrhoid   . Hypertension   . Paranoid schizophrenia ([place])   . Snake bite     Social History   Tobacco Use  . Smoking status: Never Smoker  . Smokeless tobacco: Never Used  Substance Use Topics  . Alcohol use: No  . Drug use: No    Family History  Problem Relation Age of Onset  . Colon cancer Mother        late 10s  . Schizophrenia Sister   . Colon cancer Sister        late 76 s   No Known Allergies  OBJECTIVE: Blood pressure 117/72, pulse (!) 102, temperature 98.1 F (36.7 C), temperature source Oral, resp. rate 18, height 5\' 4"  (1.626 m), weight 168 lb (76.2 kg), SpO2 94 %.  Physical Exam  Constitutional: She is oriented to person, place, and time. No distress.  HENT:  Head: Normocephalic and atraumatic.  Right Ear: External ear normal.  Left Ear: External ear normal.  Nose: Nose normal.  Mouth/Throat: Oropharynx is  clear and moist. No oropharyngeal exudate.  Eyes: Pupils are equal, round, and reactive to light. Conjunctivae, EOM and lids are normal. Right eye exhibits no discharge. Left eye exhibits no discharge. No scleral icterus.  Neck: Normal range of motion. Neck supple.  Cardiovascular: Normal rate, regular rhythm and normal heart sounds. Exam reveals no gallop and no friction rub.  No murmur heard. Pulmonary/Chest: Effort normal and breath sounds  normal. No respiratory distress. She has no wheezes. She has no rales.  Abdominal: Soft. Bowel sounds are normal. She exhibits distension. There is no tenderness. There is no rebound and no guarding.  Musculoskeletal: Normal range of motion. She exhibits no edema or tenderness.  Lymphadenopathy:    She has no cervical adenopathy.  Neurological: She is alert and oriented to person, place, and time. She has normal strength. Coordination normal.  Skin: Skin is warm and dry. No rash noted. She is not diaphoretic. No erythema. No pallor.  Psychiatric: Judgment normal. Her speech is delayed. She is slowed. She exhibits a depressed mood.   Porta cath site 08/21/17: Some bruising and site raise      Lab Results Lab Results  Component Value Date   WBC 17.0 (H) 08/20/2017   HGB 10.1 (L) 08/20/2017   HCT 30.5 (L) 08/20/2017   MCV 87.1 08/20/2017   PLT 257 08/20/2017    Lab Results  Component Value Date   CREATININE 1.17 (H) 08/20/2017   BUN 33 (H) 08/20/2017   NA 138 08/20/2017   K 4.8 08/20/2017   CL 104 08/20/2017   CO2 23 08/20/2017    Lab Results  Component Value Date   ALT 19 08/18/2017   AST 34 08/18/2017   ALKPHOS 100 08/18/2017   BILITOT 0.3 08/18/2017     Microbiology: Recent Results (from the past 240 hour(s))  Blood Culture (routine x 2)     Status: Abnormal (Preliminary result)   Collection Time: 08/17/17  2:33 PM  Result Value Ref Range Status   Specimen Description   Final    BLOOD LEFT ANTECUBITAL Performed at [facility], [place] [address], [place], [place] [address]    Special Requests   Final    BOTTLES DRAWN AEROBIC AND ANAEROBIC Blood Culture adequate volume Performed at [facility] [address] [place], [place] [address]    Culture  Setup Time YEAST AEROBIC BOTTLE ONLY   Final   Culture CANDIDA GLABRATA (A)  Final   Report Status PENDING  Incomplete  Blood Culture (routine x 2)     Status: None (Preliminary  result)   Collection Time: 08/17/17  2:45 PM  Result Value Ref Range Status   Specimen Description   Final    BLOOD PORTA CATH Performed at [facility], [place] [address] [place], [place] [address]    Special Requests   Final    BOTTLES DRAWN AEROBIC AND ANAEROBIC Blood Culture results may not be optimal due to an excessive volume of blood received in culture bottles Performed at [place] [address] [place], [place] [address]    Culture   Final    NO GROWTH 4 DAYS Performed at [facility], [place] [address], [place], [place] [address]    Report Status PENDING  Incomplete  Urine culture     Status: None   Collection Time: 08/17/17  5:00 PM  Result Value Ref Range Status   Specimen Description   Final    URINE,  RANDOM Performed at [facility], [place] [address] [place], [place] [address]    Special Requests   Final    NONE Performed at [facility], [place] [address] [place], [place] [address]    Culture   Final    NO GROWTH Performed at [facility], [place] [address] [place], [place] [address]    Report Status 08/19/2017 FINAL  Final    Alcide Evener, [place] [facility] [phone] pager  08/22/2017, 1:34 PM

## 2017-08-22 NOTE — Consult Note (Signed)
CC:  Chief Complaint  Patient presents with  . Abnormal Lab    HPI: Emily Duarte is a 67 y.o. female w/ POH of CE/IOL OU and PMH below who presents for admission for fungemia. Blood culture grew positive for fungus thus ophthalmology consulted. No changes in vision. No flashes. No floaters. No curtain/veil OU.   ROS: See above  PMH: Past Medical History:  Diagnosis Date  . Endometrial cancer ([place])   . Hemorrhoid   . Hypertension   . Paranoid schizophrenia ([place])   . Snake bite     PSH: Past Surgical History:  Procedure Laterality Date  . ENDOMETRIAL BIOPSY  08/11/2016  . IR FLUORO GUIDE PORT INSERTION RIGHT  09/05/2016  . IR US GUIDE VASC ACCESS RIGHT  09/05/2016  . TUBAL LIGATION      Meds: No current facility-administered medications on file prior to encounter.    Current Outpatient Medications on File Prior to Encounter  Medication Sig Dispense Refill  . dexamethasone (DECADRON) 4 MG tablet Take 1 tablet (4 mg total) by mouth 2 (two) times daily with a meal. 60 tablet 1  . divalproex (DEPAKOTE ER) 500 MG 24 hr tablet Take 500 mg by mouth at bedtime.    Marland Kitchen HYDROcodone-acetaminophen (NORCO) 10-325 MG tablet Take 1 tablet by mouth every 4 (four) hours as needed. (Patient taking differently: Take 1 tablet by mouth every 4 (four) hours as needed for moderate pain. ) 120 tablet 0  . magnesium oxide (MAG-OX) 400 (241.3 Mg) MG tablet Take 1 tablet (400 mg total) by mouth 2 (two) times daily. 60 tablet 11  . ondansetron (ZOFRAN) 8 MG tablet Take 1 tablet (8 mg total) by mouth every 8 (eight) hours as needed for nausea. 30 tablet 3  . potassium chloride SA (K-DUR,KLOR-CON) 10 MEQ tablet Take 1 tablet (10 mEq total) by mouth daily. 10 tablet 0  . prochlorperazine (COMPAZINE) 10 MG tablet Take 1 tablet (10 mg total) by mouth every 6 (six) hours as needed for nausea or vomiting. 30 tablet 1  . risperiDONE (RISPERDAL) 1 MG tablet Take 1 mg by mouth daily.    . rivaroxaban  (XARELTO) 20 MG TABS tablet Take 1 tablet (20 mg total) by mouth daily with supper. 30 tablet 0  . TUBERCULIN PPD ID Inject 0.1 mLs into the skin once.      SH: Social History   Socioeconomic History  . Marital status: Divorced    Spouse name: Not on file  . Number of children: 1  . Years of education: Not on file  . Highest education level: Not on file  Occupational History  . Occupation: Retired  Scientific laboratory technician  . Financial resource strain: Not on file  . Food insecurity:    Worry: Not on file    Inability: Not on file  . Transportation needs:    Medical: Not on file    Non-medical: Not on file  Tobacco Use  . Smoking status: Never Smoker  . Smokeless tobacco: Never Used  Substance and Sexual Activity  . Alcohol use: No  . Drug use: No  . Sexual activity: Never  Lifestyle  . Physical activity:    Days per week: Not on file    Minutes per session: Not on file  . Stress: Not on file  Relationships  . Social connections:    Talks on phone: Not on file    Gets together: Not on file    Attends religious service: Not on file  Active member of club or organization: Not on file    Attends meetings of clubs or organizations: Not on file    Relationship status: Not on file  Other Topics Concern  . Not on file  Social History Narrative  . Not on file    FH: Family History  Problem Relation Age of Onset  . Colon cancer Mother        late 79s  . Schizophrenia Sister   . Colon cancer Sister        late 43 s    Exam:  Lucianne Lei: OD: 3 pt cc OS: 5 pt cc  CVF: OD: full OS: full  EOM: OD: full d/v OS: full d/v  Pupils: OD: 3->2 mm, no APD OS: 3->2 mm, no APD  IOP: by [place] OD:23 OS: 24  External: OD: no periorbital edema, no proptosis, good orbicularis strength OS: no periorbital edema, no proptosis, good orbicularis strength    Pen Light Exam: L/L: OD: WNL OS: WNL  C/S: OD: white and quiet OS: white and quiet  K: OD: clear, no abnormal  staining OS: clear, no abnormal staining  A/C: OD: grossly deep and quiet appearing by pen light OS: grossly deep and quiet appearing by pen light  I: OD: round and regular OS: round and regular  L: OD: PCIOL OS: PCIOL  DFE: dilated @ 5:15 w/ Tropic and Phenyl OU  V: OD: clear OS: clear  N: OD: C/D 0.1, no disc edema OS: C/D 0.1, no disc edema  M: OD: flat, no obvious macular pathology OS: flat, no obvious macular pathology  V: OD: normal appearing vessels OS: normal appearing vessels  P: OD: retina flat 360, no obvious mass/RT/RD OS: retina flat 360, no obvious mass/RT/RD  A/P:  1. Fungemia: - No evidence of intraocular involvement  2. Ocular Hypertension: - Mild elevations - recommend outpatient assessment at earliest convenience.  Christopher T. Manuella Ghazi, MD [facility] [phone]

## 2017-08-23 ENCOUNTER — Encounter (HOSPITAL_COMMUNITY): Payer: Self-pay | Admitting: Physician Assistant

## 2017-08-23 DIAGNOSIS — D649 Anemia, unspecified: Secondary | ICD-10-CM

## 2017-08-23 DIAGNOSIS — C541 Malignant neoplasm of endometrium: Secondary | ICD-10-CM

## 2017-08-23 DIAGNOSIS — A419 Sepsis, unspecified organism: Secondary | ICD-10-CM

## 2017-08-23 DIAGNOSIS — E46 Unspecified protein-calorie malnutrition: Secondary | ICD-10-CM

## 2017-08-23 LAB — CBC WITH DIFFERENTIAL/PLATELET
Band Neutrophils: 6 %
Basophils Absolute: 0 10*3/uL (ref 0.0–0.1)
Basophils Relative: 0 %
Eosinophils Absolute: 0 10*3/uL (ref 0.0–0.7)
Eosinophils Relative: 0 %
HCT: 30.1 % — ABNORMAL LOW (ref 36.0–46.0)
Hemoglobin: 9.5 g/dL — ABNORMAL LOW (ref 12.0–15.0)
Lymphocytes Relative: 7 %
Lymphs Abs: 1.3 10*3/uL (ref 0.7–4.0)
MCH: 28.4 pg (ref 26.0–34.0)
MCHC: 31.6 g/dL (ref 30.0–36.0)
MCV: 90.1 fL (ref 78.0–100.0)
Metamyelocytes Relative: 11 %
Monocytes Absolute: 1.6 10*3/uL — ABNORMAL HIGH (ref 0.1–1.0)
Monocytes Relative: 9 %
Myelocytes: 8 %
Neutro Abs: 15 10*3/uL — ABNORMAL HIGH (ref 1.7–7.7)
Neutrophils Relative %: 59 %
Platelets: 278 10*3/uL (ref 150–400)
RBC: 3.34 MIL/uL — ABNORMAL LOW (ref 3.87–5.11)
RDW: 17.8 % — ABNORMAL HIGH (ref 11.5–15.5)
WBC: 17.9 10*3/uL — ABNORMAL HIGH (ref 4.0–10.5)

## 2017-08-23 LAB — COMPREHENSIVE METABOLIC PANEL
ALBUMIN: 1.6 g/dL — AB (ref 3.5–5.0)
ALK PHOS: 103 U/L (ref 38–126)
ALT: 20 U/L (ref 14–54)
ANION GAP: 10 (ref 5–15)
AST: 26 U/L (ref 15–41)
BUN: 30 mg/dL — AB (ref 6–20)
CALCIUM: 8.5 mg/dL — AB (ref 8.9–10.3)
CO2: 26 mmol/L (ref 22–32)
Chloride: 99 mmol/L — ABNORMAL LOW (ref 101–111)
Creatinine, Ser: 1.12 mg/dL — ABNORMAL HIGH (ref 0.44–1.00)
GFR calc Af Amer: 57 mL/min — ABNORMAL LOW (ref >60)
GFR, EST NON AFRICAN AMERICAN: 49 mL/min — AB (ref >60)
Glucose, Bld: 93 mg/dL (ref 65–99)
Potassium: 4.5 mmol/L (ref 3.5–5.1)
Sodium: 135 mmol/L (ref 135–145)
Total Protein: 5.3 g/dL — ABNORMAL LOW (ref 6.5–8.1)

## 2017-08-23 LAB — CULTURE, BLOOD (ROUTINE X 2): Special Requests: ADEQUATE

## 2017-08-23 LAB — HIV ANTIBODY (ROUTINE TESTING W REFLEX): HIV Screen 4th Generation wRfx: NONREACTIVE

## 2017-08-23 MED ORDER — HEPARIN SODIUM (PORCINE) 5000 UNIT/ML IJ SOLN
5000.0000 [IU] | Freq: Three times a day (TID) | INTRAMUSCULAR | Status: DC
  Administered 2017-08-23 – 2017-08-25 (×7): 5000 [IU] via SUBCUTANEOUS
  Filled 2017-08-23 (×7): qty 1

## 2017-08-23 MED ORDER — TRAZODONE HCL 50 MG PO TABS
50.0000 mg | ORAL_TABLET | Freq: Once | ORAL | Status: AC
  Administered 2017-08-23: 50 mg via ORAL
  Filled 2017-08-23: qty 1

## 2017-08-23 NOTE — Progress Notes (Signed)
Patient ID: Emily Duarte, female   DOB: 11/07/1950, 67 y.o.   MRN: [id number]          [facility]    Date of Admission:  08/22/2017   Day 2 anidulafungin  Emily Duarte has transient fungemia with Candida glabrata.  The species is often resistant to nasal antifungal such as fluconazole.  I will continue anidulafungin.  Repeat blood cultures prior to starting anidulafungin are pending.  Appreciate Dr. Trena Platt evaluation.  She does not have any evidence of fungal endophthalmitis.  Her one peripheral blood culture was positive on 08/17/2017.  Interestingly, the blood culture drawn through her Port-A-Cath was negative but I would still recommend Port-A-Cath removal based on published guidelines.  As Dr. Tommy Medal recommended yesterday, it would be best to have a catheter holiday until we know blood cultures are negative before placing a new central line.  Optimal duration of therapy would be 14 days following the first negative blood culture.    Michel Bickers, MD [facility] [phone] pager   [phone] cell 08/23/2017, 9:03 AM

## 2017-08-23 NOTE — Progress Notes (Signed)
PROGRESS NOTE    Emily Duarte  VFI:[id number] DOB: 05/22/1950 DOA: 08/22/2017 PCP: Nolene Ebbs, MD   Brief Narrative:  HPI On 08/22/2017 by Dr. Riccardo Dubin [name] is a 67 y.o. 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  She had a recent hospitalization April 28 to May 1st, 2019 due to hypotension related to adrenal insufficiency, she was discharged in a stable condition on dexamethasone hormone supplemental therapy.  Since her discharge 48 hours ago she has been experiencing significant weakness, moderate to severe in intensity, worse with exertion, no improving factors, associated with poor oral intake, no fevers, no chills, no diarrhea, no nausea or vomiting.  Today her cultures from April 28 tested positive for Candida Glabrata, she was called to come back to the hospital for further therapy.  She does have a right internal jugular vein Port-A-Cath (May 2018), that she used for chemotherapy.  She received carboplatin and Taxol for 6 cycles.  Apparently she tolerated treatment very poorly with progressive weight loss and renal failure.  Currently on daily Doxil no further cisplatin.   Assessment & Plan   Sepsis secondary to fungemia secondary to Candida glabrata -Related to right internal jugular vein Port-A-Cath infection -On admission, patient had leukocytosis with tachycardia -Blood culture done on 08/17/2017 was positive for Candida -Repeat blood cultures 08/22/2017 showed no growth to date -Infectious disease consulted and appreciated-recommended port removal, with blood cultures obtained after removal of port and no central lines until clearance of fungemia.  Hold chemotherapy for at least 2 weeks from first date of negative blood cultures -Ophthalmology consulted and appreciated-found no evidence of intraocular involvement.  Mild ocular hypertension recommended  outpatient assessment -Continue Eraxis  -IR consulted for removal of port-to be done on 08/25/2017  Adrenal insufficiency -Patient was recently hospitalized for adrenal insufficiency and discharged on Aug 20, 2017 -Currently blood pressure stable  Left common femoral DVT -Diagnosed March 2019 -Xarelto held  Endometrial carcinoma cancer associated pain and severe protein calorie malnutrition -follows with Dr. Alvy Bimler -Continue dietary supplements and pain control  Depression/schizophrenia -Continue home medications  Normocytic anemia, chronic -hemoglobin currently 9.5, appears to be stable  -Continue to monitor CBC  DVT Prophylaxis  heparin  Code Status: Full  Family Communication: None at bedside  Disposition Plan: Admitted, pending removal of port and repeat cultures  Consultants Infectious disease Interventional radiology Ophthalmology  Procedures  None  Antibiotics   Anti-infectives (From admission, onward)   Start     Dose/Rate Route Frequency Ordered Stop   08/23/17 1400  anidulafungin (ERAXIS) 100 mg in sodium chloride 0.9 % 100 mL IVPB     100 mg 78 mL/hr over 100 Minutes Intravenous Every 24 hours 08/22/17 1231     08/22/17 1400  anidulafungin (ERAXIS) 200 mg in sodium chloride 0.9 % 200 mL IVPB     200 mg 78 mL/hr over 200 Minutes Intravenous  Once 08/22/17 1231 08/22/17 1703      Subjective:   Emily Duarte seen and examined today.  Patient has no complaints this morning.  Denies any current chest pain, shortness breath, nausea vomiting, diarrhea constipation, dizziness or headache.  Objective:   Vitals:   08/22/17 1730 08/22/17 1827 08/22/17 2039 08/23/17 0518  BP: 135/89 (!) 151/88 113/81 130/80  Pulse: 94 (!) 103 (!) 107 96  Resp:  15 16 17   Temp:  97.9 F (36.6 C) 98 F (36.7 C) 98.2 F (36.8 C)  TempSrc:  Oral Oral Oral  SpO2: 96% 97% 95% 92%  Weight:      Height:        Intake/Output Summary (Last 24 hours) at 08/23/2017  1418 Last data filed at 08/23/2017 1038 Gross per 24 hour  Intake 740 ml  Output 500 ml  Net 240 ml   Filed Weights   08/22/17 1233  Weight: 76.2 kg (168 lb)    Exam  General: Well developed, chronically ill-appearing, no apparent distress  HEENT: NCAT, mucous membranes moist.   Neck: Supple  Cardiovascular: S1 S2 auscultated, no rubs, murmurs or gallops. Regular rate and rhythm.  Respiratory: Clear to auscultation bilaterally with equal chest rise  Abdomen: Soft, nontender, mildly distended, + bowel sounds  Extremities: warm dry without cyanosis clubbing or edema  Neuro: AAOx3, nonfocal  Psych: Normal affect and demeanor with intact judgement and insight   Data Reviewed: I have personally reviewed following labs and imaging studies  CBC: Recent Labs  Lab 08/17/17 1350 08/18/17 0921  08/19/17 0732 08/19/17 2342 08/20/17 0457 08/22/17 1242 08/23/17 0504  WBC 12.1* 13.5*   < > 15.5* 16.0* 17.0* 18.4* 17.9*  NEUTROABS 7.1 12.2*  --   --   --   --  16.1* 15.0*  HGB 9.1* 7.2*   < > 7.0* 9.7* 10.1* 10.2* 9.5*  HCT 28.7* 22.6*   < > 21.7* 30.2* 30.5* 31.4* 30.1*  MCV 90.0 90.4   < > 91.2 88.0 87.1 89.0 90.1  PLT 324 267   < > 306 257 257 269 278   < > = values in this interval not displayed.   Basic Metabolic Panel: Recent Labs  Lab 08/18/17 0921 08/19/17 0550 08/20/17 0457 08/22/17 1242 08/23/17 0504  NA 140 141 138 137 135  K 4.0 4.3 4.8 4.3 4.5  CL 107 106 104 101 99*  CO2 24 25 23 27 26   GLUCOSE 140* 107* 95 111* 93  BUN 26* 31* 33* 30* 30*  CREATININE 1.22* 1.15* 1.17* 1.07* 1.12*  CALCIUM 7.8* 7.9* 8.0* 8.2* 8.5*  MG  --  1.4*  --   --   --    GFR: Estimated Creatinine Clearance: 48 mL/min (A) (by C-G formula based on SCr of 1.12 mg/dL (H)). Liver Function Tests: Recent Labs  Lab 08/17/17 1350 08/18/17 0921 08/23/17 0504  AST 32 34 26  ALT 19 19 20   ALKPHOS 114 100 103  BILITOT 0.3 0.3 <0.1*  PROT 5.9* 5.4* 5.3*  ALBUMIN 1.8* 1.6* 1.6*    No results for input(s): LIPASE, AMYLASE in the last 168 hours. No results for input(s): AMMONIA in the last 168 hours. Coagulation Profile: Recent Labs  Lab 08/17/17 1350  INR 1.25   Cardiac Enzymes: No results for input(s): CKTOTAL, CKMB, CKMBINDEX, TROPONINI in the last 168 hours. BNP (last 3 results) No results for input(s): PROBNP in the last 8760 hours. HbA1C: No results for input(s): HGBA1C in the last 72 hours. CBG: No results for input(s): GLUCAP in the last 168 hours. Lipid Profile: No results for input(s): CHOL, HDL, LDLCALC, TRIG, CHOLHDL, LDLDIRECT in the last 72 hours. Thyroid Function Tests: No results for input(s): TSH, T4TOTAL, FREET4, T3FREE, THYROIDAB in the last 72 hours. Anemia Panel: No results for input(s): VITAMINB12, FOLATE, FERRITIN, TIBC, IRON, RETICCTPCT in the last 72 hours. Urine analysis:    Component Value Date/Time   COLORURINE YELLOW 08/17/2017 1700   APPEARANCEUR CLEAR 08/17/2017 1700   LABSPEC 1.014 08/17/2017 1700   PHURINE 5.0  08/17/2017 1700   GLUCOSEU NEGATIVE 08/17/2017 1700   HGBUR NEGATIVE 08/17/2017 1700   BILIRUBINUR NEGATIVE 08/17/2017 1700   KETONESUR NEGATIVE 08/17/2017 1700   PROTEINUR NEGATIVE 08/17/2017 1700   UROBILINOGEN 0.2 07/12/2009 1846   NITRITE NEGATIVE 08/17/2017 1700   LEUKOCYTESUR NEGATIVE 08/17/2017 1700   Sepsis Labs: @LABRCNTIP (procalcitonin:4,lacticidven:4)  ) Recent Results (from the past 240 hour(s))  Blood Culture (routine x 2)     Status: Abnormal   Collection Time: 08/17/17  2:33 PM  Result Value Ref Range Status   Specimen Description   Final    BLOOD LEFT ANTECUBITAL Performed at [facility], [place] [address] [place], [place] [address]    Special Requests   Final    BOTTLES DRAWN AEROBIC AND ANAEROBIC Blood Culture adequate volume Performed at [facility] [address], [place], [place] [address]    Culture  Setup Time   Final    YEAST AEROBIC  BOTTLE ONLY CRITICAL RESULT CALLED TO, READ BACK BY AND VERIFIED WITH: DR Lurline Del [id number] MLM Performed at [facility], [place] [address] [place], [place] [address]    Culture CANDIDA GLABRATA (A)  Final   Report Status 08/23/2017 FINAL  Final  Blood Culture ID Panel (Reflexed)     Status: Abnormal   Collection Time: 08/17/17  2:33 PM  Result Value Ref Range Status   Enterococcus species NOT DETECTED NOT DETECTED Final   Listeria monocytogenes NOT DETECTED NOT DETECTED Final   Staphylococcus species NOT DETECTED NOT DETECTED Final   Staphylococcus aureus NOT DETECTED NOT DETECTED Final   Streptococcus species NOT DETECTED NOT DETECTED Final   Streptococcus agalactiae NOT DETECTED NOT DETECTED Final   Streptococcus pneumoniae NOT DETECTED NOT DETECTED Final   Streptococcus pyogenes NOT DETECTED NOT DETECTED Final   Acinetobacter baumannii NOT DETECTED NOT DETECTED Final   Enterobacteriaceae species NOT DETECTED NOT DETECTED Final   Enterobacter cloacae complex NOT DETECTED NOT DETECTED Final   Escherichia coli NOT DETECTED NOT DETECTED Final   Klebsiella oxytoca NOT DETECTED NOT DETECTED Final   Klebsiella pneumoniae NOT DETECTED NOT DETECTED Final   Proteus species NOT DETECTED NOT DETECTED Final   Serratia marcescens NOT DETECTED NOT DETECTED Final   Haemophilus influenzae NOT DETECTED NOT DETECTED Final   Neisseria meningitidis NOT DETECTED NOT DETECTED Final   Pseudomonas aeruginosa NOT DETECTED NOT DETECTED Final   Candida albicans NOT DETECTED NOT DETECTED Final   Candida glabrata DETECTED (A) NOT DETECTED Final    Comment: CRITICAL RESULT CALLED TO, READ BACK BY AND VERIFIED WITH: DR Lurline Del [id number] MLM    Candida krusei NOT DETECTED NOT DETECTED Final   Candida parapsilosis NOT DETECTED NOT DETECTED Final   Candida tropicalis NOT DETECTED NOT DETECTED Final    Comment: Performed at [facility], [address], [place], [place] [address]  Blood Culture  (routine x 2)     Status: None   Collection Time: 08/17/17  2:45 PM  Result Value Ref Range Status   Specimen Description   Final    BLOOD PORTA CATH Performed at [facility] [address] [place], [place] [address]    Special Requests   Final    BOTTLES DRAWN AEROBIC AND ANAEROBIC Blood Culture results may not be optimal due to an excessive volume of blood received in culture bottles Performed at [facility] [address] [place], [place] [address]    Culture  Final    NO GROWTH 5 DAYS Performed at [facility], [place] [address] [place], [place] [address]    Report Status 08/22/2017 FINAL  Final  Urine culture     Status: None   Collection Time: 08/17/17  5:00 PM  Result Value Ref Range Status   Specimen Description   Final    URINE, RANDOM Performed at [facility] [address] [place], [place] [address]    Special Requests   Final    NONE Performed at [facility], [place] [address], [place], [place] [address]    Culture   Final    NO GROWTH Performed at [facility], [place] [address] [place], [place] [address]    Report Status 08/19/2017 FINAL  Final  Blood culture (routine x 2)     Status: None (Preliminary result)   Collection Time: 08/22/17  1:00 PM  Result Value Ref Range Status   Specimen Description   Final    BLOOD LEFT ANTECUBITAL Performed at [facility] [address] [place], [place] [address]    Special Requests   Final    BOTTLES DRAWN AEROBIC AND ANAEROBIC Blood Culture adequate volume Performed at [facility] [address], [place], [place] [address]    Culture   Final    NO GROWTH < 24 HOURS Performed at [facility] [address] [place], [place] [address]    Report Status PENDING  Incomplete  Blood culture (routine x 2)     Status: None (Preliminary result)   Collection Time: 08/22/17  1:13 PM    Result Value Ref Range Status   Specimen Description   Final    BLOOD RIGHT ANTECUBITAL Performed at [facility] [address], [place], [place] [address]    Special Requests   Final    BOTTLES DRAWN AEROBIC AND ANAEROBIC Blood Culture adequate volume Performed at [facility] [address] [place], [place] [address]    Culture   Final    NO GROWTH < 24 HOURS Performed at [facility] [address] [place], [place] [address]    Report Status PENDING  Incomplete      Radiology Studies: No results found.   Scheduled Meds: . dexamethasone  4 mg Oral BID WC  . divalproex  500 mg Oral QHS  . feeding supplement (ENSURE ENLIVE)  237 mL Oral BID BM  . heparin injection (subcutaneous)  5,000 Units Subcutaneous Q8H  . magnesium oxide  400 mg Oral BID  . risperiDONE  1 mg Oral Daily   Continuous Infusions: . anidulafungin       LOS: 1 day   Time Spent in minutes   30 minutes   Edyth Glomb D.O. on 08/23/2017 at 2:18 PM  Between 7am to 7pm - Pager - [phone]  After 7pm go to [email or web address] - password [email or web address]  And look for the night coverage person covering for me after hours  [facility] Office  [phone]

## 2017-08-23 NOTE — H&P (Signed)
Chief Complaint: Fungemia  Referring Physician(s): Arrien, Jimmy Picket  Supervising Physician: Aletta Edouard  Patient Status: [facility] - In-pt  History of Present Illness: Emily Duarte is a 67 y.o. 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    She had a recent hospitalization April 28 to May 1st, 2019 due to hypotension related to adrenal insufficiency, she was discharged in a stable condition on dexamethasone hormone supplemental therapy.    Since her discharge 48 hours ago she has been experiencing significant weakness and with poor oral intake.  She denies fevers/chills or nausea/vomiting.    She had cultures done from April 28 which were positive for Candida Glabrata. She was called to come back to the hospital for further therapy.  She does have a right internal jugular vein Port-A-Cath which was placed May, 2018 by Dr. Pascal Lux.  She received carboplatin and Taxol for 6 cycles.  Apparently she tolerated treatment very poorly with progressive weight loss and renal failure.    Currently on daily Doxil no further cisplatin.   We are asked to remove her Port A Cath.   Past Medical History:  Diagnosis Date  . Endometrial cancer ([place])   . Hemorrhoid   . Hypertension   . Paranoid schizophrenia ([place])   . Snake bite     Past Surgical History:  Procedure Laterality Date  . ENDOMETRIAL BIOPSY  08/11/2016  . IR FLUORO GUIDE PORT INSERTION RIGHT  09/05/2016  . IR US GUIDE VASC ACCESS RIGHT  09/05/2016  . TUBAL LIGATION      Allergies: Patient has no known allergies.  Medications: Prior to Admission medications   Medication Sig Start Date End Date Taking? Authorizing Provider  dexamethasone (DECADRON) 4 MG tablet Take 1 tablet (4 mg total) by mouth 2 (two) times daily with a meal. 07/11/17  Yes Gorsuch, Ni, MD  divalproex (DEPAKOTE ER) 500 MG 24 hr tablet Take 500  mg by mouth at bedtime.   Yes [provider]  HYDROcodone-acetaminophen (NORCO) 10-325 MG tablet Take 1 tablet by mouth every 4 (four) hours as needed. Patient taking differently: Take 1 tablet by mouth every 4 (four) hours as needed for moderate pain.  08/20/17  Yes Gerlene Fee, NP  magnesium oxide (MAG-OX) 400 (241.3 Mg) MG tablet Take 1 tablet (400 mg total) by mouth 2 (two) times daily. 06/23/17  Yes Gorsuch, Ni, MD  ondansetron (ZOFRAN) 8 MG tablet Take 1 tablet (8 mg total) by mouth every 8 (eight) hours as needed for nausea. 04/10/17  Yes Gorsuch, Ni, MD  potassium chloride SA (K-DUR,KLOR-CON) 10 MEQ tablet Take 1 tablet (10 mEq total) by mouth daily. 08/14/17  Yes Lavina Hamman, MD  prochlorperazine (COMPAZINE) 10 MG tablet Take 1 tablet (10 mg total) by mouth every 6 (six) hours as needed for nausea or vomiting. 04/10/17  Yes Gorsuch, Ni, MD  risperiDONE (RISPERDAL) 1 MG tablet Take 1 mg by mouth daily. 05/13/17  Yes [provider]  rivaroxaban (XARELTO) 20 MG TABS tablet Take 1 tablet (20 mg total) by mouth daily with supper. 08/14/17  Yes Lavina Hamman, MD  TUBERCULIN PPD ID Inject 0.1 mLs into the skin once.   Yes [provider]     Family History  Problem Relation Age of Onset  . Colon cancer Mother        late 67s  . Schizophrenia Sister   . Colon cancer Sister  late 52 s    Social History   Socioeconomic History  . Marital status: Divorced    Spouse name: Not on file  . Number of children: 1  . Years of education: Not on file  . Highest education level: Not on file  Occupational History  . Occupation: Retired  Scientific laboratory technician  . Financial resource strain: Not on file  . Food insecurity:    Worry: Not on file    Inability: Not on file  . Transportation needs:    Medical: Not on file    Non-medical: Not on file  Tobacco Use  . Smoking status: Never Smoker  . Smokeless tobacco: Never Used  Substance and Sexual Activity  .  Alcohol use: No  . Drug use: No  . Sexual activity: Never  Lifestyle  . Physical activity:    Days per week: Not on file    Minutes per session: Not on file  . Stress: Not on file  Relationships  . Social connections:    Talks on phone: Not on file    Gets together: Not on file    Attends religious service: Not on file    Active member of club or organization: Not on file    Attends meetings of clubs or organizations: Not on file    Relationship status: Not on file  Other Topics Concern  . Not on file  Social History Narrative  . Not on file     Review of Systems: A 12 point ROS discussed and pertinent positives are indicated in the HPI above.  All other systems are negative. Review of Systems  Vital Signs: BP 130/80 (BP Location: Left Arm)   Pulse 96   Temp 98.2 F (36.8 C) (Oral)   Resp 17   Ht 5\' 4"  (1.626 m)   Wt 168 lb (76.2 kg)   SpO2 92%   BMI 28.84 kg/m   Physical Exam  Constitutional: She is oriented to person, place, and time. She appears well-developed.  HENT:  Head: Normocephalic and atraumatic.  Eyes: EOM are normal.  Neck: Normal range of motion.  Cardiovascular: Normal rate and regular rhythm.  Pulmonary/Chest: Effort normal. No respiratory distress.  Port A Cath Right Chest    Abdominal: She exhibits no distension. There is no tenderness.  Musculoskeletal: Normal range of motion.  Neurological: She is alert and oriented to person, place, and time.  Skin: Skin is warm and dry.  Psychiatric: She has a normal mood and affect. Her behavior is normal. Judgment and thought content normal.  Vitals reviewed.   Imaging: Dg Chest 2 View  Result Date: 08/17/2017 CLINICAL DATA:  Hypotension today. Undergoing chemotherapy for endometrial cancer. EXAM: CHEST - 2 VIEW COMPARISON:  08/11/2017 FINDINGS: Right IJ Port-A-Cath is present with tip over the right atrium unchanged. Patient is rotated to the left. Lungs are adequately inflated with hazy  opacification over the right mid to lower lung slightly worse compatible with moderate size effusion with associated atelectasis. Slight worsened left base opacification compatible with small effusion with atelectasis. Cardiomediastinal silhouette and remainder the exam is unchanged. IMPRESSION: Worsening moderate size right effusion and worsening small left effusion likely with associated bibasilar atelectasis. Right IJ Port-A-Cath unchanged with tip over the right atrium. Electronically Signed   By: Marin Olp M.D.   On: 08/17/2017 16:06   Dg Chest Port 1 View  Result Date: 08/11/2017 CLINICAL DATA:  Generalized weakness, shortness of breath. EXAM: PORTABLE CHEST 1 VIEW COMPARISON:  Radiograph of  May 05, 2017. FINDINGS: The heart size and mediastinal contours are within normal limits. No pneumothorax is noted. Left lung is clear. Right internal jugular Port-A-Cath is unchanged in position. Mild right basilar atelectasis is noted with associated pleural effusion. The visualized skeletal structures are unremarkable. IMPRESSION: Mild right basilar subsegmental atelectasis is noted with associated pleural effusion. Electronically Signed   By: Marijo Conception, M.D.   On: 08/11/2017 14:00   Dg Abd Portable 1v  Result Date: 08/17/2017 CLINICAL DATA:  Abdominal distention. EXAM: PORTABLE ABDOMEN - 1 VIEW COMPARISON:  CT abdomen pelvis dated June 20, 2017. FINDINGS: The bowel gas pattern is normal. Moderate colonic stool burden, unchanged. No radio-opaque calculi or other significant radiographic abnormality are seen. Multiple phleboliths in the pelvis. No acute osseous abnormality. IMPRESSION: 1. Prominent stool throughout the colon favors constipation. No obstruction. Electronically Signed   By: Titus Dubin M.D.   On: 08/17/2017 17:44    Labs:  CBC: Recent Labs    08/19/17 2342 08/20/17 0457 08/22/17 1242 08/23/17 0504  WBC 16.0* 17.0* 18.4* 17.9*  HGB 9.7* 10.1* 10.2* 9.5*  HCT 30.2*  30.5* 31.4* 30.1*  PLT 257 257 269 278    COAGS: Recent Labs    09/05/16 1026 08/17/17 1350  INR 1.15 1.25    BMP: Recent Labs    08/19/17 0550 08/20/17 0457 08/22/17 1242 08/23/17 0504  NA 141 138 137 135  K 4.3 4.8 4.3 4.5  CL 106 104 101 99*  CO2 25 23 27 26   GLUCOSE 107* 95 111* 93  BUN 31* 33* 30* 30*  CALCIUM 7.9* 8.0* 8.2* 8.5*  CREATININE 1.15* 1.17* 1.07* 1.12*  GFRNONAA 48* 47* 52* 49*  GFRAA 55* 54* >60 57*    LIVER FUNCTION TESTS: Recent Labs    08/15/17 2207 08/17/17 1350 08/18/17 0921 08/23/17 0504  BILITOT 0.1* 0.3 0.3 <0.1*  AST 37 32 34 26  ALT 22 19 19 20   ALKPHOS 127* 114 100 103  PROT 5.8* 5.9* 5.4* 5.3*  ALBUMIN 2.0* 1.8* 1.6* 1.6*    TUMOR MARKERS: No results for input(s): AFPTM, CEA, CA199, CHROMGRNA in the last 8760 hours.  Assessment and Plan:  Fungemia  Will plan for removal of tunneled catheter with port on Monday.  Discussed with Cristal Ford, DO. She feels the patient is stable and does not need this procedure urgently over the weekend.  She does understand if the patients condition worsens we can proceed sooner.   Dr. Ree Kida will change Lovenox to Heparin so a dose can be held.  Risks and benefits of image guided port-a-catheter placement was discussed with the patient including, but not limited to bleeding, infection, and need for additional procedures.  All of the patient's questions were answered, patient is agreeable to proceed. Consent signed and in chart.  Thank you for this interesting consult.  I greatly enjoyed meeting Emily Duarte and look forward to participating in their care.  A copy of this report was sent to the requesting provider on this date.  Electronically Signed: Murrell Redden, PA-C   08/23/2017, 9:54 AM      I spent a total of    25 Minutes in face to face in clinical consultation, greater than 50% of which was counseling/coordinating care for removal of Port A Cath.

## 2017-08-23 NOTE — Evaluation (Signed)
Physical Therapy Evaluation Patient Details Name: Emily Duarte MRN: [id number] DOB: [birth year] Today's Date: 08/23/2017   History of Present Illness  Pt admitted with fungemia.  PMH" endometrial cancer, hypertension, schizophrenia, left common femoral vein deep vein thrombosis  Clinical Impression  Pt admitted with above diagnosis. Pt currently with functional limitations due to the deficits listed below (see PT Problem List). Pt will benefit from skilled PT to increase their independence and safety with mobility to allow discharge to the venue listed below.  Pt has been at SNF since last discharge.  Recommend returning to SNF.     Follow Up Recommendations SNF    Equipment Recommendations  None recommended by PT    Recommendations for Other Services       Precautions / Restrictions Precautions Precautions: Fall Restrictions Weight Bearing Restrictions: No      Mobility  Bed Mobility Overal bed mobility: Needs Assistance Bed Mobility: Supine to Sit Rolling: Min guard         General bed mobility comments: Pt required increased time, but able to do with min/guard  Transfers Overall transfer level: Needs assistance Equipment used: Rolling walker (2 wheeled) Transfers: Sit to/from [facility] Sit to Stand: Mod assist Stand pivot transfers: Min assist       General transfer comment: Unable to stand on 1st attempt.  on 2nd she was and then completed SPT. cues for hand placement and safe turning technique.  Ambulation/Gait             General Gait Details: SPT only  Stairs            Wheelchair Mobility    Modified Rankin (Stroke Patients Only)       Balance Overall balance assessment: Needs assistance   Sitting balance-Leahy Scale: Fair       Standing balance-Leahy Scale: Poor Standing balance comment: heavy reliance on RW                             Pertinent Vitals/Pain Pain Assessment: No/denies pain     Home Living Family/patient expects to be discharged to:: Skilled nursing facility Living Arrangements: Alone Available Help at Discharge: Family;Available PRN/intermittently(son) Type of Home: Apartment Home Access: Stairs to enter   Entrance Stairs-Number of Steps: 1 Home Layout: One level Home Equipment: Walker - 4 wheels      Prior Function           Comments: She was recently d/c from hospital to SNF.     Hand Dominance        Extremity/Trunk Assessment   Upper Extremity Assessment Upper Extremity Assessment: Generalized weakness    Lower Extremity Assessment Lower Extremity Assessment: Generalized weakness    Cervical / Trunk Assessment Cervical / Trunk Assessment: Normal  Communication   Communication: No difficulties  Cognition Arousal/Alertness: Awake/alert Behavior During Therapy: WFL for tasks assessed/performed Overall Cognitive Status: Within Functional Limits for tasks assessed                                 General Comments: some repetitive statements      General Comments      Exercises     Assessment/Plan    PT Assessment Patient needs continued PT services  PT Problem List Decreased strength;Decreased balance;Decreased mobility;Decreased activity tolerance;Decreased knowledge of use of DME;Decreased safety awareness       PT Treatment Interventions  DME instruction;Gait training;Functional mobility training;Therapeutic activities;Balance training;Patient/family education;Therapeutic exercise    PT Goals (Current goals can be found in the Care Plan section)  Acute Rehab PT Goals Patient Stated Goal: none stated PT Goal Formulation: With patient Time For Goal Achievement: 09/06/17 Potential to Achieve Goals: Good    Frequency Min 2X/week   Barriers to discharge        Co-evaluation               AM-PAC PT "6 Clicks" Daily Activity  Outcome Measure Difficulty turning over in bed (including adjusting  bedclothes, sheets and blankets)?: A Little Difficulty moving from lying on back to sitting on the side of the bed? : A Little Difficulty sitting down on and standing up from a chair with arms (e.g., wheelchair, bedside commode, etc,.)?: Unable Help needed moving to and from a bed to chair (including a wheelchair)?: A Lot Help needed walking in hospital room?: Total Help needed climbing 3-5 steps with a railing? : Total 6 Click Score: 11    End of Session Equipment Utilized During Treatment: Gait belt Activity Tolerance: Patient limited by fatigue Patient left: in chair;with call bell/phone within reach;with chair alarm set Nurse Communication: Mobility status PT Visit Diagnosis: Muscle weakness (generalized) (M62.81);Difficulty in walking, not elsewhere classified (R26.2)    Time: 1007-1219 PT Time Calculation (min) (ACUTE ONLY): 22 min   Charges:   PT Evaluation $PT Eval Moderate Complexity: 1 Mod     PT G Codes:        Daquon Greenleaf L. Tamala Julian, [place] Pager [phone] 08/23/2017   Galen Manila 08/23/2017, 2:08 PM

## 2017-08-24 DIAGNOSIS — N183 Chronic kidney disease, stage 3 (moderate): Secondary | ICD-10-CM

## 2017-08-24 LAB — CBC
HCT: 29.4 % — ABNORMAL LOW (ref 36.0–46.0)
Hemoglobin: 9.3 g/dL — ABNORMAL LOW (ref 12.0–15.0)
MCH: 28.6 pg (ref 26.0–34.0)
MCHC: 31.6 g/dL (ref 30.0–36.0)
MCV: 90.5 fL (ref 78.0–100.0)
Platelets: 280 10*3/uL (ref 150–400)
RBC: 3.25 MIL/uL — AB (ref 3.87–5.11)
RDW: 17.6 % — AB (ref 11.5–15.5)
WBC: 17.4 10*3/uL — AB (ref 4.0–10.5)

## 2017-08-24 LAB — BASIC METABOLIC PANEL
ANION GAP: 10 (ref 5–15)
BUN: 35 mg/dL — ABNORMAL HIGH (ref 6–20)
CALCIUM: 8.2 mg/dL — AB (ref 8.9–10.3)
CO2: 27 mmol/L (ref 22–32)
Chloride: 100 mmol/L — ABNORMAL LOW (ref 101–111)
Creatinine, Ser: 1.18 mg/dL — ABNORMAL HIGH (ref 0.44–1.00)
GFR, EST AFRICAN AMERICAN: 54 mL/min — AB (ref >60)
GFR, EST NON AFRICAN AMERICAN: 46 mL/min — AB (ref >60)
Glucose, Bld: 114 mg/dL — ABNORMAL HIGH (ref 65–99)
Potassium: 3.9 mmol/L (ref 3.5–5.1)
SODIUM: 137 mmol/L (ref 135–145)

## 2017-08-24 LAB — HEPATITIS C ANTIBODY (REFLEX)

## 2017-08-24 LAB — HCV COMMENT:

## 2017-08-24 NOTE — Progress Notes (Signed)
PROGRESS NOTE    Emily Duarte  UKG:[id number] DOB: 06-25-1950 DOA: 08/22/2017 PCP: Nolene Ebbs, MD   Brief Narrative:  HPI On 08/22/2017 by Dr. Riccardo Dubin [name] is a 67 y.o. 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  She had a recent hospitalization April 28 to May 1st, 2019 due to hypotension related to adrenal insufficiency, she was discharged in a stable condition on dexamethasone hormone supplemental therapy.  Since her discharge 48 hours ago she has been experiencing significant weakness, moderate to severe in intensity, worse with exertion, no improving factors, associated with poor oral intake, no fevers, no chills, no diarrhea, no nausea or vomiting.  Today her cultures from April 28 tested positive for Candida Glabrata, she was called to come back to the hospital for further therapy.  She does have a right internal jugular vein Port-A-Cath (May 2018), that she used for chemotherapy.  She received carboplatin and Taxol for 6 cycles.  Apparently she tolerated treatment very poorly with progressive weight loss and renal failure.  Currently on daily Doxil no further cisplatin.   Assessment & Plan   Sepsis secondary to fungemia secondary to Candida glabrata -Related to right internal jugular vein Port-A-Cath infection -On admission, patient had leukocytosis with tachycardia -Blood culture done on 08/17/2017 was positive for Candida -Repeat blood cultures 08/22/2017 showed no growth to date -Infectious disease consulted and appreciated-recommended port removal, with blood cultures obtained after removal of port and no central lines until clearance of fungemia.  Hold chemotherapy for at least 2 weeks from first date of negative blood cultures -Ophthalmology consulted and appreciated-found no evidence of intraocular involvement.  Mild ocular hypertension recommended  outpatient assessment -Continue Eraxis  -IR consulted for removal of port-to be done on 08/25/2017  Adrenal insufficiency -Patient was recently hospitalized for adrenal insufficiency and discharged on Aug 20, 2017 -Currently blood pressure stable  Left common femoral DVT -Diagnosed March 2019 -Xarelto held  Endometrial carcinoma cancer associated pain and severe protein calorie malnutrition -follows with Dr. Alvy Bimler -Continue dietary supplements and pain control  Depression/schizophrenia -Continue home medications  Normocytic anemia, chronic -hemoglobin currently 9.3, appears to be stable  -Continue to monitor CBC  Chronic kidney disease, stage III -Creatinine currently stable, continue to monitor BMP  DVT Prophylaxis  heparin  Code Status: Full  Family Communication: None at bedside  Disposition Plan: Admitted, pending removal of port and repeat cultures  Consultants Infectious disease Interventional radiology Ophthalmology  Procedures  None  Antibiotics   Anti-infectives (From admission, onward)   Start     Dose/Rate Route Frequency Ordered Stop   08/23/17 1400  anidulafungin (ERAXIS) 100 mg in sodium chloride 0.9 % 100 mL IVPB     100 mg 78 mL/hr over 100 Minutes Intravenous Every 24 hours 08/22/17 1231     08/22/17 1400  anidulafungin (ERAXIS) 200 mg in sodium chloride 0.9 % 200 mL IVPB     200 mg 78 mL/hr over 200 Minutes Intravenous  Once 08/22/17 1231 08/22/17 1703      Subjective:   Emily Duarte seen and examined today.  Patient has no complaints this morning.  Denies current chest pain, shortness breath, abdominal pain, nausea vomiting, diarrhea constipation.  Objective:   Vitals:   08/23/17 0518 08/23/17 1436 08/23/17 2126 08/24/17 0513  BP: 130/80 135/81 (!) 141/84 128/83  Pulse: 96 (!) 109 (!) 102 84  Resp: 17 16 16 16   Temp: 98.2 F (36.8  C) 98.4 F (36.9 C) 98.6 F (37 C) 98.3 F (36.8 C)  TempSrc: Oral Oral Oral Oral  SpO2: 92%  97% 97% 97%  Weight:    72.5 kg (159 lb 13.3 oz)  Height:        Intake/Output Summary (Last 24 hours) at 08/24/2017 1024 Last data filed at 08/24/2017 0430 Gross per 24 hour  Intake 550 ml  Output 701 ml  Net -151 ml   Filed Weights   08/22/17 1233 08/24/17 0513  Weight: 76.2 kg (168 lb) 72.5 kg (159 lb 13.3 oz)   Exam  General: Well developed, chronically ill-appearing, no apparent distress  HEENT: NCAT, mucous membranes moist.   Neck: Supple  Cardiovascular: S1 S2 auscultated, RRR, no murmur  Respiratory: Clear to auscultation bilaterally with equal chest rise  Abdomen: Soft, nontender, nondistended, + bowel sounds  Extremities: warm dry without cyanosis clubbing or edema  Neuro: AAOx3, nonfocal  Psych: Normal affect and demeanor with intact judgement and insight  Data Reviewed: I have personally reviewed following labs and imaging studies  CBC: Recent Labs  Lab 08/17/17 1350 08/18/17 0921  08/19/17 2342 08/20/17 0457 08/22/17 1242 08/23/17 0504 08/24/17 0420  WBC 12.1* 13.5*   < > 16.0* 17.0* 18.4* 17.9* 17.4*  NEUTROABS 7.1 12.2*  --   --   --  16.1* 15.0*  --   HGB 9.1* 7.2*   < > 9.7* 10.1* 10.2* 9.5* 9.3*  HCT 28.7* 22.6*   < > 30.2* 30.5* 31.4* 30.1* 29.4*  MCV 90.0 90.4   < > 88.0 87.1 89.0 90.1 90.5  PLT 324 267   < > 257 257 269 278 280   < > = values in this interval not displayed.   Basic Metabolic Panel: Recent Labs  Lab 08/19/17 0550 08/20/17 0457 08/22/17 1242 08/23/17 0504 08/24/17 0420  NA 141 138 137 135 137  K 4.3 4.8 4.3 4.5 3.9  CL 106 104 101 99* 100*  CO2 25 23 27 26 27   GLUCOSE 107* 95 111* 93 114*  BUN 31* 33* 30* 30* 35*  CREATININE 1.15* 1.17* 1.07* 1.12* 1.18*  CALCIUM 7.9* 8.0* 8.2* 8.5* 8.2*  MG 1.4*  --   --   --   --    GFR: Estimated Creatinine Clearance: 44.5 mL/min (A) (by C-G formula based on SCr of 1.18 mg/dL (H)). Liver Function Tests: Recent Labs  Lab 08/17/17 1350 08/18/17 0921 08/23/17 0504  AST  32 34 26  ALT 19 19 20   ALKPHOS 114 100 103  BILITOT 0.3 0.3 <0.1*  PROT 5.9* 5.4* 5.3*  ALBUMIN 1.8* 1.6* 1.6*   No results for input(s): LIPASE, AMYLASE in the last 168 hours. No results for input(s): AMMONIA in the last 168 hours. Coagulation Profile: Recent Labs  Lab 08/17/17 1350  INR 1.25   Cardiac Enzymes: No results for input(s): CKTOTAL, CKMB, CKMBINDEX, TROPONINI in the last 168 hours. BNP (last 3 results) No results for input(s): PROBNP in the last 8760 hours. HbA1C: No results for input(s): HGBA1C in the last 72 hours. CBG: No results for input(s): GLUCAP in the last 168 hours. Lipid Profile: No results for input(s): CHOL, HDL, LDLCALC, TRIG, CHOLHDL, LDLDIRECT in the last 72 hours. Thyroid Function Tests: No results for input(s): TSH, T4TOTAL, FREET4, T3FREE, THYROIDAB in the last 72 hours. Anemia Panel: No results for input(s): VITAMINB12, FOLATE, FERRITIN, TIBC, IRON, RETICCTPCT in the last 72 hours. Urine analysis:    Component Value Date/Time   COLORURINE YELLOW  08/17/2017 1700   APPEARANCEUR CLEAR 08/17/2017 1700   LABSPEC 1.014 08/17/2017 1700   PHURINE 5.0 08/17/2017 1700   GLUCOSEU NEGATIVE 08/17/2017 1700   HGBUR NEGATIVE 08/17/2017 1700   BILIRUBINUR NEGATIVE 08/17/2017 1700   KETONESUR NEGATIVE 08/17/2017 1700   PROTEINUR NEGATIVE 08/17/2017 1700   UROBILINOGEN 0.2 07/12/2009 1846   NITRITE NEGATIVE 08/17/2017 1700   LEUKOCYTESUR NEGATIVE 08/17/2017 1700   Sepsis Labs: @LABRCNTIP (procalcitonin:4,lacticidven:4)  ) Recent Results (from the past 240 hour(s))  Blood Culture (routine x 2)     Status: Abnormal   Collection Time: 08/17/17  2:33 PM  Result Value Ref Range Status   Specimen Description   Final    BLOOD LEFT ANTECUBITAL Performed at [facility], [place] [address], [place], [place] [address]    Special Requests   Final    BOTTLES DRAWN AEROBIC AND ANAEROBIC Blood Culture adequate volume Performed at [place] [address] [place], [place] [address]    Culture  Setup Time   Final    YEAST AEROBIC BOTTLE ONLY CRITICAL RESULT CALLED TO, READ BACK BY AND VERIFIED WITH: DR Lurline Del [id number] MLM Performed at [facility], [place] [address], [place], [place] [address]    Culture CANDIDA GLABRATA (A)  Final   Report Status 08/23/2017 FINAL  Final  Blood Culture ID Panel (Reflexed)     Status: Abnormal   Collection Time: 08/17/17  2:33 PM  Result Value Ref Range Status   Enterococcus species NOT DETECTED NOT DETECTED Final   Listeria monocytogenes NOT DETECTED NOT DETECTED Final   Staphylococcus species NOT DETECTED NOT DETECTED Final   Staphylococcus aureus NOT DETECTED NOT DETECTED Final   Streptococcus species NOT DETECTED NOT DETECTED Final   Streptococcus agalactiae NOT DETECTED NOT DETECTED Final   Streptococcus pneumoniae NOT DETECTED NOT DETECTED Final   Streptococcus pyogenes NOT DETECTED NOT DETECTED Final   Acinetobacter baumannii NOT DETECTED NOT DETECTED Final   Enterobacteriaceae species NOT DETECTED NOT DETECTED Final   Enterobacter cloacae complex NOT DETECTED NOT DETECTED Final   Escherichia coli NOT DETECTED NOT DETECTED Final   Klebsiella oxytoca NOT DETECTED NOT DETECTED Final   Klebsiella pneumoniae NOT DETECTED NOT DETECTED Final   Proteus species NOT DETECTED NOT DETECTED Final   Serratia marcescens NOT DETECTED NOT DETECTED Final   Haemophilus influenzae NOT DETECTED NOT DETECTED Final   Neisseria meningitidis NOT DETECTED NOT DETECTED Final   Pseudomonas aeruginosa NOT DETECTED NOT DETECTED Final   Candida albicans NOT DETECTED NOT DETECTED Final   Candida glabrata DETECTED (A) NOT DETECTED Final    Comment: CRITICAL RESULT CALLED TO, READ BACK BY AND VERIFIED WITH: DR Lurline Del [id number] MLM    Candida krusei NOT DETECTED NOT DETECTED Final   Candida parapsilosis NOT DETECTED NOT DETECTED Final   Candida tropicalis NOT DETECTED NOT  DETECTED Final    Comment: Performed at [facility], [address] [place], [place] [address]  Blood Culture (routine x 2)     Status: None   Collection Time: 08/17/17  2:45 PM  Result Value Ref Range Status   Specimen Description   Final    BLOOD PORTA CATH Performed at [place] [address], [place], [place] [address]    Special Requests   Final    BOTTLES DRAWN AEROBIC AND ANAEROBIC Blood Culture results may not be optimal due to an excessive volume of blood received in culture bottles Performed  at [facility], [place] [address], [place], [place] [address]    Culture   Final    NO GROWTH 5 DAYS Performed at [facility], [place] [address] [place], [place] [address]    Report Status 08/22/2017 FINAL  Final  Urine culture     Status: None   Collection Time: 08/17/17  5:00 PM  Result Value Ref Range Status   Specimen Description   Final    URINE, RANDOM Performed at [place] [address] [place], [place] [address]    Special Requests   Final    NONE Performed at [facility], [place] [address], [place], [place] [address]    Culture   Final    NO GROWTH Performed at [facility], [place] [address], [place], [place] [address]    Report Status 08/19/2017 FINAL  Final  Blood culture (routine x 2)     Status: None (Preliminary result)   Collection Time: 08/22/17  1:00 PM  Result Value Ref Range Status   Specimen Description   Final    BLOOD LEFT ANTECUBITAL Performed at [place] [address] [place], [place] [address]    Special Requests   Final    BOTTLES DRAWN AEROBIC AND ANAEROBIC Blood Culture adequate volume Performed at [place] [address], [place], [place] [address]    Culture   Final    NO GROWTH < 24 HOURS Performed at [place] [address], [place], [place] [address]    Report Status PENDING   Incomplete  Blood culture (routine x 2)     Status: None (Preliminary result)   Collection Time: 08/22/17  1:13 PM  Result Value Ref Range Status   Specimen Description   Final    BLOOD RIGHT ANTECUBITAL Performed at [facility] [address] [place], [place] [address]    Special Requests   Final    BOTTLES DRAWN AEROBIC AND ANAEROBIC Blood Culture adequate volume Performed at [place] [address] [place],  [address]    Culture   Final    NO GROWTH < 24 HOURS Performed at [facility] [address], [place],  [address]    Report Status PENDING  Incomplete      Radiology Studies: No results found.   Scheduled Meds: . dexamethasone  4 mg Oral BID WC  . divalproex  500 mg Oral QHS  . feeding supplement (ENSURE ENLIVE)  237 mL Oral BID BM  . heparin injection (subcutaneous)  5,000 Units Subcutaneous Q8H  . magnesium oxide  400 mg Oral BID  . risperiDONE  1 mg Oral Daily   Continuous Infusions: . anidulafungin Stopped (08/23/17 1600)     LOS: 2 days   Time Spent in minutes   30 minutes   Dontae Minerva D.O. on 08/24/2017 at 10:24 AM  Between 7am to 7pm - Pager - [phone]  After 7pm go to [email or web address] - password [email or web address]  And look for the night coverage person covering for me after hours  [facility] Office  [phone]

## 2017-08-24 NOTE — Plan of Care (Signed)
Plan of care discussed.   

## 2017-08-24 NOTE — NC FL2 (Signed)
[place] MEDICAID FL2 LEVEL OF CARE SCREENING TOOL     IDENTIFICATION  Patient Name: Emily Duarte Birthdate: [birth year] Sex: female Admission Date (Current Location): 08/22/2017  [facility] and [place] Number:  Herbalist and Address:  [facility],  [place] [address] [place]      Provider Number: [id number]  Attending Physician Name and Address:  Cristal Ford, DO  Relative Name and Phone Number:       Current Level of Care: Hospital Recommended Level of Care: [place] Prior Approval Number:    Date Approved/Denied:   PASRR Number:    Discharge Plan: SNF    Current Diagnoses: Patient Active Problem List   Diagnosis Date Noted  . Endometrial cancer ([place])   . Fungemia 08/22/2017  . CKD (chronic kidney disease) stage 3, GFR 30-59 ml/min (HCC) 08/21/2017  . Chronic bilateral pleural effusions 08/21/2017  . Leukocytosis 08/21/2017  . SIRS (systemic inflammatory response syndrome) ([place]) 08/18/2017  . Schizophrenia ([place])   . Dehydration 08/17/2017  . Malnutrition ([place]) 08/13/2017  . UTI (urinary tract infection) 08/11/2017  . Hypomagnesemia 06/23/2017  . Acute prerenal failure ([place]) 06/23/2017  . Left femoral vein DVT ([place]) 06/20/2017  . Lung nodule < 6cm on CT 06/06/2017  . Hypokalemia 06/06/2017  . Nausea with vomiting 06/06/2017  . Delusions ([place])   . Goals of care, counseling/discussion 03/12/2017  . Cancer associated pain 03/12/2017  . Schizoaffective disorder, depressive type ([place]) 02/05/2017  . Peripheral neuropathy due to chemotherapy ([place]) 12/09/2016  . Elevated liver enzymes 09/06/2016  . Anemia, chronic disease 09/06/2016  . Endometrial/uterine adenocarcinoma ([place]) 08/19/2016  . Bipolar 1 disorder, mixed, severe ([place]) 08/07/2012  . Cyst of soft tissue 08/07/2012    Orientation RESPIRATION BLADDER Height & Weight     Self, Time, Situation, Place  Normal Incontinent, External catheter Weight:  159 lb 13.3 oz (72.5 kg) Height:  5\' 4"  (162.6 cm)  BEHAVIORAL SYMPTOMS/MOOD NEUROLOGICAL BOWEL NUTRITION STATUS      Continent Diet(please see DC summary)  AMBULATORY STATUS COMMUNICATION OF NEEDS Skin   Extensive Assist Verbally PU Stage and Appropriate Care(PU stage II buttocks, foam)                       Personal Care Assistance Level of Assistance  Bathing, Feeding, Dressing Bathing Assistance: Limited assistance Feeding assistance: Independent Dressing Assistance: Limited assistance     Functional Limitations Info  Sight, Hearing, Speech Sight Info: Adequate Hearing Info: Adequate Speech Info: Adequate    SPECIAL CARE FACTORS FREQUENCY  PT (By licensed PT)     PT Frequency: 5x/week              Contractures Contractures Info: Not present    Additional Factors Info  Code Status, Allergies, Psychotropic Code Status Info: Full Allergies Info: No Known Allergies Psychotropic Info: depakote, risperdal         Current Medications (08/24/2017):  This is the current hospital active medication list Current Facility-Administered Medications  Medication Dose Route Frequency Provider Last Rate Last Dose  . acetaminophen (TYLENOL) tablet 650 mg  650 mg Oral Q6H PRN Arrien, Jimmy Picket, MD       Or  . acetaminophen (TYLENOL) suppository 650 mg  650 mg Rectal Q6H PRN Arrien, Jimmy Picket, MD      . anidulafungin (ERAXIS) 100 mg in sodium chloride 0.9 % 100 mL IVPB  100 mg Intravenous Q24H Arrien, Jimmy Picket, MD   Stopped at  08/24/17 1627  . dexamethasone (DECADRON) tablet 4 mg  4 mg Oral BID WC Arrien, Jimmy Picket, MD   4 mg at 08/24/17 1627  . divalproex (DEPAKOTE ER) 24 hr tablet 500 mg  500 mg Oral QHS Arrien, Jimmy Picket, MD   500 mg at 08/23/17 2121  . feeding supplement (ENSURE ENLIVE) (ENSURE ENLIVE) liquid 237 mL  237 mL Oral BID BM Arrien, Jimmy Picket, MD      . heparin injection 5,000 Units  5,000 Units Subcutaneous Q8H Mikhail,  [place], DO   5,000 Units at 08/24/17 1415  . HYDROcodone-acetaminophen (NORCO) 10-325 MG per tablet 1 tablet  1 tablet Oral Q4H PRN Arrien, Jimmy Picket, MD      . magnesium oxide (MAG-OX) tablet 400 mg  400 mg Oral BID Tawni Millers, MD   400 mg at 08/24/17 0911  . ondansetron (ZOFRAN) tablet 4 mg  4 mg Oral Q6H PRN Arrien, Jimmy Picket, MD       Or  . ondansetron [facility]) injection 4 mg  4 mg Intravenous Q6H PRN Arrien, Jimmy Picket, MD      . risperiDONE (RISPERDAL) tablet 1 mg  1 mg Oral Daily Arrien, Jimmy Picket, MD   1 mg at 08/24/17 0911     Discharge Medications: Please see discharge summary for a list of discharge medications.  Relevant Imaging Results:  Relevant Lab Results:   Additional Information SSN: [id number]  Estanislado Emms, LCSW

## 2017-08-24 NOTE — Progress Notes (Signed)
Patient ID: Emily Duarte, female   DOB: 1950/06/24, 67 y.o.   MRN: [id number]         [facility]    Date of Admission:  08/22/2017   Day 3 anidulafungin  Emily Duarte has transient fungemia with Candida glabrata.  Repeat blood cultures prior to starting anidulafungin are negative at 48 hours.  Her Port-A-Cath is scheduled for removal tomorrow.  A new catheter can be placed once we are certain repeat blood cultures remain negative.  Optimal duration of therapy would be 14 days following the first negative blood culture.  Michel Bickers, MD [facility] [place] [facility] [phone] pager   [phone] cell 08/23/2017, 9:03 AM

## 2017-08-24 NOTE — Plan of Care (Signed)
Pt alert and oriented, resting with no complaints.  Plan to have R chest port removed per MD order 08/25/17

## 2017-08-24 NOTE — Progress Notes (Signed)
Initial Nutrition Assessment  DOCUMENTATION CODES:   Severe malnutrition in context of chronic illness  INTERVENTION:   -Provide Magic cup TID with meals, each supplement provides 290 kcal and 9 grams of protein -Continue Ensure Enlive po BID, each supplement provides 350 kcal and 20 grams of protein  NUTRITION DIAGNOSIS:   Severe Malnutrition related to cancer and cancer related treatments, chronic illness as evidenced by energy intake < or equal to 75% for > or equal to 1 month, moderate fat depletion, severe muscle depletion.  GOAL:   Patient will meet greater than or equal to 90% of their needs  MONITOR:   PO intake, Supplement acceptance, Labs, Weight trends, I & O's, Skin  REASON FOR ASSESSMENT:   Consult Assessment of nutrition requirement/status  ASSESSMENT:   67 y.o. 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   Patient recently discharged on 5/1. Pt's appetite continued to be poor during that time. Pt has developed a stage II pressure injury on buttocks.  Pt ate 100% of her breakfast this morning (~380 kcal, 22g protein). Over the past 48 hours, pt's intakes have streadily improved. Will continue Ensure supplements and add Magic Cups to meals as well.    Per chart review, pt has gained 20 lb since 4/27. Given continued poor PO intakes and exam, severe malnutrition continues.  Labs reviewed. Medications: MAG-OX tablet BID  NUTRITION - FOCUSED PHYSICAL EXAM:    Most Recent Value  Orbital Region  Moderate depletion  Upper Arm Region  Moderate depletion  Thoracic and Lumbar Region  Unable to assess  Buccal Region  Mild depletion  Temple Region  Severe depletion  Clavicle Bone Region  Severe depletion  Clavicle and Acromion Bone Region  Moderate depletion  Scapular Bone Region  Unable to assess  Dorsal Hand  Moderate depletion  Patellar Region  Moderate depletion   Anterior Thigh Region  Moderate depletion  Posterior Calf Region  Moderate depletion  Edema (RD Assessment)  None       Diet Order:   Diet Order           Diet regular Room service appropriate? Yes; Fluid consistency: Thin  Diet effective now          EDUCATION NEEDS:   No education needs have been identified at this time  Skin:  Skin Assessment: Skin Integrity Issues: Skin Integrity Issues:: Stage II Stage II: buttocks  Last BM:  5/1  Height:   Ht Readings from Last 1 Encounters:  08/22/17 5\' 4"  (1.626 m)    Weight:   Wt Readings from Last 1 Encounters:  08/24/17 159 lb 13.3 oz (72.5 kg)    Ideal Body Weight:  54.5 kg  BMI:  Body mass index is 27.44 kg/m.  Estimated Nutritional Needs:   Kcal:  1900-2100  Protein:  90-100g  Fluid:  1.7L/day  Clayton Bibles, MS, RD, LDN [facility] Pager: [phone] After Hours Pager: [phone]

## 2017-08-24 NOTE — Plan of Care (Signed)
  Problem: Safety: Goal: Ability to remain free from injury will improve Outcome: Progressing   

## 2017-08-25 ENCOUNTER — Inpatient Hospital Stay (HOSPITAL_COMMUNITY): Payer: Medicare Other

## 2017-08-25 DIAGNOSIS — L8990 Pressure ulcer of unspecified site, unspecified stage: Secondary | ICD-10-CM

## 2017-08-25 DIAGNOSIS — E43 Unspecified severe protein-calorie malnutrition: Secondary | ICD-10-CM

## 2017-08-25 LAB — BASIC METABOLIC PANEL
Anion gap: 11 (ref 5–15)
BUN: 39 mg/dL — AB (ref 6–20)
CHLORIDE: 100 mmol/L — AB (ref 101–111)
CO2: 27 mmol/L (ref 22–32)
Calcium: 8.3 mg/dL — ABNORMAL LOW (ref 8.9–10.3)
Creatinine, Ser: 1.33 mg/dL — ABNORMAL HIGH (ref 0.44–1.00)
GFR calc non Af Amer: 40 mL/min — ABNORMAL LOW (ref >60)
GFR, EST AFRICAN AMERICAN: 46 mL/min — AB (ref >60)
Glucose, Bld: 101 mg/dL — ABNORMAL HIGH (ref 65–99)
Potassium: 3.6 mmol/L (ref 3.5–5.1)
SODIUM: 138 mmol/L (ref 135–145)

## 2017-08-25 LAB — CBC
HCT: 28.8 % — ABNORMAL LOW (ref 36.0–46.0)
HEMOGLOBIN: 9.4 g/dL — AB (ref 12.0–15.0)
MCH: 29.3 pg (ref 26.0–34.0)
MCHC: 32.6 g/dL (ref 30.0–36.0)
MCV: 89.7 fL (ref 78.0–100.0)
Platelets: 313 10*3/uL (ref 150–400)
RBC: 3.21 MIL/uL — AB (ref 3.87–5.11)
RDW: 17.5 % — ABNORMAL HIGH (ref 11.5–15.5)
WBC: 17.3 10*3/uL — ABNORMAL HIGH (ref 4.0–10.5)

## 2017-08-25 MED ORDER — FENTANYL CITRATE (PF) 100 MCG/2ML IJ SOLN
INTRAMUSCULAR | Status: AC
  Filled 2017-08-25: qty 4

## 2017-08-25 MED ORDER — LIDOCAINE-EPINEPHRINE (PF) 2 %-1:200000 IJ SOLN
INTRAMUSCULAR | Status: AC
  Filled 2017-08-25: qty 20

## 2017-08-25 MED ORDER — CEFAZOLIN SODIUM-DEXTROSE 2-4 GM/100ML-% IV SOLN
2.0000 g | Freq: Once | INTRAVENOUS | Status: AC
  Administered 2017-08-26: 2 g via INTRAVENOUS

## 2017-08-25 MED ORDER — MIDAZOLAM HCL 2 MG/2ML IJ SOLN
INTRAMUSCULAR | Status: AC
  Filled 2017-08-25: qty 4

## 2017-08-25 MED ORDER — CEFAZOLIN SODIUM-DEXTROSE 2-4 GM/100ML-% IV SOLN
INTRAVENOUS | Status: AC
  Filled 2017-08-25: qty 100

## 2017-08-25 NOTE — Progress Notes (Signed)
Pt refusing to be repositioned.

## 2017-08-25 NOTE — Progress Notes (Signed)
Patient ID: Emily Duarte, female   DOB: [birth year], 66 y.o.   MRN: [id number] Patient's Port-A-Cath removal has been rescheduled for 5/7; patient received heparin injection this afternoon; above discussed with Drs.Vernard Gambles and Mikhail. Pt/nurse aware.

## 2017-08-25 NOTE — Progress Notes (Addendum)
PROGRESS NOTE    Emily Duarte  QBH:[id number] DOB: March 10, 1951 DOA: 08/22/2017 PCP: Nolene Ebbs, MD   Brief Narrative:  HPI On 08/22/2017 by Dr. Riccardo Dubin [name] is a 67 y.o. 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  She had a recent hospitalization April 28 to May 1st, 2019 due to hypotension related to adrenal insufficiency, she was discharged in a stable condition on dexamethasone hormone supplemental therapy.  Since her discharge 48 hours ago she has been experiencing significant weakness, moderate to severe in intensity, worse with exertion, no improving factors, associated with poor oral intake, no fevers, no chills, no diarrhea, no nausea or vomiting.  Today her cultures from April 28 tested positive for Candida Glabrata, she was called to come back to the hospital for further therapy.  She does have a right internal jugular vein Port-A-Cath (May 2018), that she used for chemotherapy.  She received carboplatin and Taxol for 6 cycles.  Apparently she tolerated treatment very poorly with progressive weight loss and renal failure.  Currently on daily Doxil no further cisplatin.   Interim history Plan for removal of patient's port today.  Will need to redraw blood cultures thereafter.  Assessment & Plan   Sepsis secondary to fungemia secondary to Candida glabrata -Related to right internal jugular vein Port-A-Cath infection -On admission, patient had leukocytosis with tachycardia -Blood culture done on 08/17/2017 was positive for Candida -Repeat blood cultures 08/22/2017 showed no growth to date -Infectious disease consulted and appreciated-recommended port removal, with blood cultures obtained after removal of port and no central lines until clearance of fungemia.  Hold chemotherapy for at least 2 weeks from first date of negative blood cultures -Ophthalmology  consulted and appreciated-found no evidence of intraocular involvement.  Mild ocular hypertension recommended outpatient assessment -Continue Eraxis  -IR consulted for removal of port-to be done on 08/25/2017, today  Adrenal insufficiency -Patient was recently hospitalized for adrenal insufficiency and discharged on Aug 20, 2017 -Currently blood pressure stable  Left common femoral DVT -Diagnosed March 2019 -Xarelto held  Endometrial carcinoma cancer associated pain and severe protein calorie malnutrition -follows with Dr. Alvy Bimler -Continue dietary supplements and pain control  Depression/schizophrenia -Continue home medications  Normocytic anemia, chronic -hemoglobin currently 9.4, appears to be stable  -Continue to monitor CBC  Chronic kidney disease, stage III -Creatinine currently stable, continue to monitor BMP  Severe malnutrition -Attrition consulted and appreciated, continue nutritional supplements  DVT Prophylaxis  heparin  Code Status: Full  Family Communication: None at bedside  Disposition Plan: Admitted, pending removal of port and repeat cultures  Consultants Infectious disease Interventional radiology Ophthalmology  Procedures  None  Antibiotics   Anti-infectives (From admission, onward)   Start     Dose/Rate Route Frequency Ordered Stop   08/23/17 1400  anidulafungin (ERAXIS) 100 mg in sodium chloride 0.9 % 100 mL IVPB     100 mg 78 mL/hr over 100 Minutes Intravenous Every 24 hours 08/22/17 1231     08/22/17 1400  anidulafungin (ERAXIS) 200 mg in sodium chloride 0.9 % 200 mL IVPB     200 mg 78 mL/hr over 200 Minutes Intravenous  Once 08/22/17 1231 08/22/17 1703      Subjective:   Emily Duarte seen and examined today.  Patient has no complaints this morning.  Denies current chest pain, shortness breath, abdominal pain, nausea vomiting, diarrhea constipation, dizziness or headache.  Objective:   Vitals:   08/24/17 0973  08/24/17 1340  08/24/17 2200 08/25/17 0614  BP: 128/83 130/74 140/86 130/87  Pulse: 84 99 99 89  Resp: 16 16 15 16   Temp: 98.3 F (36.8 C) 98.2 F (36.8 C) 98.4 F (36.9 C) 97.8 F (36.6 C)  TempSrc: Oral Oral Oral Oral  SpO2: 97% 95% 97% 95%  Weight: 72.5 kg (159 lb 13.3 oz)     Height:        Intake/Output Summary (Last 24 hours) at 08/25/2017 1225 Last data filed at 08/25/2017 1000 Gross per 24 hour  Intake 550 ml  Output 601 ml  Net -51 ml   Filed Weights   08/22/17 1233 08/24/17 0513  Weight: 76.2 kg (168 lb) 72.5 kg (159 lb 13.3 oz)   Exam  General: Well developed, chronically ill-appearing, no apparent distress  HEENT: NCAT, mucous membranes moist.   Neck: Supple, no JVD, no masses  Cardiovascular: S1 S2 auscultated, no rubs, murmurs or gallops. Regular rate and rhythm.  Respiratory: Clear to auscultation bilaterally with equal chest rise  Abdomen: Soft, nontender, nondistended, + bowel sounds  Extremities: warm dry without cyanosis clubbing or edema  Neuro: AAOx3, nonfocal  Psych: Normal affect and demeanor with intact judgement and insight  Data Reviewed: I have personally reviewed following labs and imaging studies  CBC: Recent Labs  Lab 08/20/17 0457 08/22/17 1242 08/23/17 0504 08/24/17 0420 08/25/17 0448  WBC 17.0* 18.4* 17.9* 17.4* 17.3*  NEUTROABS  --  16.1* 15.0*  --   --   HGB 10.1* 10.2* 9.5* 9.3* 9.4*  HCT 30.5* 31.4* 30.1* 29.4* 28.8*  MCV 87.1 89.0 90.1 90.5 89.7  PLT 257 269 278 280 283   Basic Metabolic Panel: Recent Labs  Lab 08/19/17 0550 08/20/17 0457 08/22/17 1242 08/23/17 0504 08/24/17 0420 08/25/17 0448  NA 141 138 137 135 137 138  K 4.3 4.8 4.3 4.5 3.9 3.6  CL 106 104 101 99* 100* 100*  CO2 25 23 27 26 27 27   GLUCOSE 107* 95 111* 93 114* 101*  BUN 31* 33* 30* 30* 35* 39*  CREATININE 1.15* 1.17* 1.07* 1.12* 1.18* 1.33*  CALCIUM 7.9* 8.0* 8.2* 8.5* 8.2* 8.3*  MG 1.4*  --   --   --   --   --    GFR: Estimated Creatinine  Clearance: 39.5 mL/min (A) (by C-G formula based on SCr of 1.33 mg/dL (H)). Liver Function Tests: Recent Labs  Lab 08/23/17 0504  AST 26  ALT 20  ALKPHOS 103  BILITOT <0.1*  PROT 5.3*  ALBUMIN 1.6*   No results for input(s): LIPASE, AMYLASE in the last 168 hours. No results for input(s): AMMONIA in the last 168 hours. Coagulation Profile: No results for input(s): INR, PROTIME in the last 168 hours. Cardiac Enzymes: No results for input(s): CKTOTAL, CKMB, CKMBINDEX, TROPONINI in the last 168 hours. BNP (last 3 results) No results for input(s): PROBNP in the last 8760 hours. HbA1C: No results for input(s): HGBA1C in the last 72 hours. CBG: No results for input(s): GLUCAP in the last 168 hours. Lipid Profile: No results for input(s): CHOL, HDL, LDLCALC, TRIG, CHOLHDL, LDLDIRECT in the last 72 hours. Thyroid Function Tests: No results for input(s): TSH, T4TOTAL, FREET4, T3FREE, THYROIDAB in the last 72 hours. Anemia Panel: No results for input(s): VITAMINB12, FOLATE, FERRITIN, TIBC, IRON, RETICCTPCT in the last 72 hours. Urine analysis:    Component Value Date/Time   COLORURINE YELLOW 08/17/2017 1700   APPEARANCEUR CLEAR 08/17/2017 1700   LABSPEC 1.014 08/17/2017 1700  PHURINE 5.0 08/17/2017 1700   GLUCOSEU NEGATIVE 08/17/2017 1700   HGBUR NEGATIVE 08/17/2017 1700   BILIRUBINUR NEGATIVE 08/17/2017 1700   KETONESUR NEGATIVE 08/17/2017 1700   PROTEINUR NEGATIVE 08/17/2017 1700   UROBILINOGEN 0.2 07/12/2009 1846   NITRITE NEGATIVE 08/17/2017 1700   LEUKOCYTESUR NEGATIVE 08/17/2017 1700   Sepsis Labs: @LABRCNTIP (procalcitonin:4,lacticidven:4)  ) Recent Results (from the past 240 hour(s))  Blood Culture (routine x 2)     Status: Abnormal   Collection Time: 08/17/17  2:33 PM  Result Value Ref Range Status   Specimen Description   Final    BLOOD LEFT ANTECUBITAL Performed at [facility], [place] [address] [place], [place] [address]    Special Requests    Final    BOTTLES DRAWN AEROBIC AND ANAEROBIC Blood Culture adequate volume Performed at [facility] [address], [place], [place] [address]    Culture  Setup Time   Final    YEAST AEROBIC BOTTLE ONLY CRITICAL RESULT CALLED TO, READ BACK BY AND VERIFIED WITH: DR Lurline Del [id number] MLM Performed at [facility], [place] [address] [place], [place] [address]    Culture CANDIDA GLABRATA (A)  Final   Report Status 08/23/2017 FINAL  Final  Blood Culture ID Panel (Reflexed)     Status: Abnormal   Collection Time: 08/17/17  2:33 PM  Result Value Ref Range Status   Enterococcus species NOT DETECTED NOT DETECTED Final   Listeria monocytogenes NOT DETECTED NOT DETECTED Final   Staphylococcus species NOT DETECTED NOT DETECTED Final   Staphylococcus aureus NOT DETECTED NOT DETECTED Final   Streptococcus species NOT DETECTED NOT DETECTED Final   Streptococcus agalactiae NOT DETECTED NOT DETECTED Final   Streptococcus pneumoniae NOT DETECTED NOT DETECTED Final   Streptococcus pyogenes NOT DETECTED NOT DETECTED Final   Acinetobacter baumannii NOT DETECTED NOT DETECTED Final   Enterobacteriaceae species NOT DETECTED NOT DETECTED Final   Enterobacter cloacae complex NOT DETECTED NOT DETECTED Final   Escherichia coli NOT DETECTED NOT DETECTED Final   Klebsiella oxytoca NOT DETECTED NOT DETECTED Final   Klebsiella pneumoniae NOT DETECTED NOT DETECTED Final   Proteus species NOT DETECTED NOT DETECTED Final   Serratia marcescens NOT DETECTED NOT DETECTED Final   Haemophilus influenzae NOT DETECTED NOT DETECTED Final   Neisseria meningitidis NOT DETECTED NOT DETECTED Final   Pseudomonas aeruginosa NOT DETECTED NOT DETECTED Final   Candida albicans NOT DETECTED NOT DETECTED Final   Candida glabrata DETECTED (A) NOT DETECTED Final    Comment: CRITICAL RESULT CALLED TO, READ BACK BY AND VERIFIED WITH: DR Lurline Del [id number] MLM    Candida krusei NOT DETECTED NOT DETECTED  Final   Candida parapsilosis NOT DETECTED NOT DETECTED Final   Candida tropicalis NOT DETECTED NOT DETECTED Final    Comment: Performed at [facility], [address] [place], [place] [address]  Blood Culture (routine x 2)     Status: None   Collection Time: 08/17/17  2:45 PM  Result Value Ref Range Status   Specimen Description   Final    BLOOD PORTA CATH Performed at [facility] [address], [place], [place] [address]    Special Requests   Final    BOTTLES DRAWN AEROBIC AND ANAEROBIC Blood Culture results may not be optimal due to an excessive volume of blood received in culture bottles Performed at [facility] [address] [place], [place] [address]    Culture  Final    NO GROWTH 5 DAYS Performed at [facility], [place] [address] [place], [place] [address]    Report Status 08/22/2017 FINAL  Final  Urine culture     Status: None   Collection Time: 08/17/17  5:00 PM  Result Value Ref Range Status   Specimen Description   Final    URINE, RANDOM Performed at [facility] [address] [place], [place] [address]    Special Requests   Final    NONE Performed at [facility], [place] [address] [place], [place] [address]    Culture   Final    NO GROWTH Performed at [facility], [place] [address], [place], [place] [address]    Report Status 08/19/2017 FINAL  Final  Blood culture (routine x 2)     Status: None (Preliminary result)   Collection Time: 08/22/17  1:00 PM  Result Value Ref Range Status   Specimen Description   Final    BLOOD LEFT ANTECUBITAL Performed at [facility] [address], [place], [place] [address]    Special Requests   Final    BOTTLES DRAWN AEROBIC AND ANAEROBIC Blood Culture adequate volume Performed at [facility] [address] [place], [place] [address]    Culture   Final    NO GROWTH 3 DAYS Performed  at [facility], [place] [address], [place], [place] [address]    Report Status PENDING  Incomplete  Blood culture (routine x 2)     Status: None (Preliminary result)   Collection Time: 08/22/17  1:13 PM  Result Value Ref Range Status   Specimen Description   Final    BLOOD RIGHT ANTECUBITAL Performed at [facility] [address], [place], [place] [address]    Special Requests   Final    BOTTLES DRAWN AEROBIC AND ANAEROBIC Blood Culture adequate volume Performed at [facility] [address], [place], [place] [address]    Culture   Final    NO GROWTH 3 DAYS Performed at [facility], [place] [address] [place], [place] [address]    Report Status PENDING  Incomplete      Radiology Studies: No results found.   Scheduled Meds: . dexamethasone  4 mg Oral BID WC  . divalproex  500 mg Oral QHS  . feeding supplement (ENSURE ENLIVE)  237 mL Oral BID BM  . heparin injection (subcutaneous)  5,000 Units Subcutaneous Q8H  . magnesium oxide  400 mg Oral BID  . risperiDONE  1 mg Oral Daily   Continuous Infusions: . anidulafungin Stopped (08/24/17 1627)     LOS: 3 days   Time Spent in minutes   30 minutes   Otie Headlee D.O. on 08/25/2017 at 12:25 PM  Between 7am to 7pm - Pager - [phone]  After 7pm go to [email or web address] - password [email or web address]  And look for the night coverage person covering for me after hours  [facility] Office  [phone]

## 2017-08-26 ENCOUNTER — Inpatient Hospital Stay (HOSPITAL_COMMUNITY): Payer: Medicare Other

## 2017-08-26 ENCOUNTER — Encounter (HOSPITAL_COMMUNITY): Payer: Self-pay | Admitting: Radiology

## 2017-08-26 DIAGNOSIS — T380X5A Adverse effect of glucocorticoids and synthetic analogues, initial encounter: Secondary | ICD-10-CM

## 2017-08-26 DIAGNOSIS — Z6827 Body mass index (BMI) 27.0-27.9, adult: Secondary | ICD-10-CM

## 2017-08-26 DIAGNOSIS — Z7901 Long term (current) use of anticoagulants: Secondary | ICD-10-CM

## 2017-08-26 DIAGNOSIS — I82412 Acute embolism and thrombosis of left femoral vein: Secondary | ICD-10-CM

## 2017-08-26 DIAGNOSIS — B488 Other specified mycoses: Secondary | ICD-10-CM

## 2017-08-26 DIAGNOSIS — T80219A Unspecified infection due to central venous catheter, initial encounter: Secondary | ICD-10-CM

## 2017-08-26 DIAGNOSIS — G893 Neoplasm related pain (acute) (chronic): Secondary | ICD-10-CM

## 2017-08-26 DIAGNOSIS — E43 Unspecified severe protein-calorie malnutrition: Secondary | ICD-10-CM

## 2017-08-26 DIAGNOSIS — D630 Anemia in neoplastic disease: Secondary | ICD-10-CM

## 2017-08-26 DIAGNOSIS — Z7952 Long term (current) use of systemic steroids: Secondary | ICD-10-CM

## 2017-08-26 DIAGNOSIS — L8990 Pressure ulcer of unspecified site, unspecified stage: Secondary | ICD-10-CM

## 2017-08-26 HISTORY — PX: IR REMOVAL TUN ACCESS W/ PORT W/O FL MOD SED: IMG2290

## 2017-08-26 MED ORDER — MIDAZOLAM HCL 2 MG/2ML IJ SOLN
INTRAMUSCULAR | Status: AC
  Filled 2017-08-26: qty 2

## 2017-08-26 MED ORDER — IOHEXOL 300 MG/ML  SOLN
100.0000 mL | Freq: Once | INTRAMUSCULAR | Status: AC | PRN
  Administered 2017-08-26: 100 mL via INTRAVENOUS

## 2017-08-26 MED ORDER — LIDOCAINE HCL (PF) 1 % IJ SOLN
INTRAMUSCULAR | Status: AC | PRN
  Administered 2017-08-26: 30 mL

## 2017-08-26 MED ORDER — LIDOCAINE HCL 1 % IJ SOLN
INTRAMUSCULAR | Status: AC
  Filled 2017-08-26: qty 20

## 2017-08-26 MED ORDER — CEFAZOLIN SODIUM-DEXTROSE 2-4 GM/100ML-% IV SOLN
INTRAVENOUS | Status: AC
  Administered 2017-08-26: 2 g via INTRAVENOUS
  Filled 2017-08-26: qty 100

## 2017-08-26 MED ORDER — FENTANYL CITRATE (PF) 100 MCG/2ML IJ SOLN
INTRAMUSCULAR | Status: AC
  Filled 2017-08-26: qty 2

## 2017-08-26 MED ORDER — FENTANYL CITRATE (PF) 100 MCG/2ML IJ SOLN
INTRAMUSCULAR | Status: AC | PRN
  Administered 2017-08-26: 50 ug via INTRAVENOUS

## 2017-08-26 MED ORDER — IOHEXOL 300 MG/ML  SOLN
30.0000 mL | Freq: Once | INTRAMUSCULAR | Status: AC | PRN
  Administered 2017-08-26: 30 mL via ORAL

## 2017-08-26 MED ORDER — MIDAZOLAM HCL 2 MG/2ML IJ SOLN
INTRAMUSCULAR | Status: AC | PRN
  Administered 2017-08-26: 1 mg via INTRAVENOUS

## 2017-08-26 NOTE — Care Management Important Message (Signed)
Important Message  Patient Details  Name: JANESHA BRISSETTE MRN: [id number] Date of Birth: 06-08-1950   Medicare Important Message Given:  Yes    Kerin Salen 08/26/2017, 12:14 [place] Message  Patient Details  Name: ASHIA DEHNER MRN: [id number] Date of Birth: 10/18/1950   Medicare Important Message Given:  Yes    Kerin Salen 08/26/2017, 12:14 PM

## 2017-08-26 NOTE — Progress Notes (Signed)
MEDICATION-RELATED CONSULT NOTE   IR Procedure Consult - Anticoagulant/Antiplatelet PTA/Inpatient Med List Review by Pharmacist    Procedure: removal of right IJ port    Completed: 08/26/17 at ~4PM  Post-Procedural bleeding risk per IR MD assessment:  low  Antithrombotic medications on inpatient or PTA profile prior to procedure:   none   Plan:     - heparin SQ d/ced by MD on 08/25/17 - if wish to resume heparin SQ for VTE prophylaxis, can resume tonight (08/26/17) at [place] will sign off  Dia Sitter, PharmD, BCPS 08/26/2017 4:11 PM

## 2017-08-26 NOTE — Progress Notes (Signed)
Subjective: No new complaints   Antibiotics:  Anti-infectives (From admission, onward)   Start     Dose/Rate Route Frequency Ordered Stop   08/26/17 1600  ceFAZolin (ANCEF) IVPB 2g/100 mL premix     2 g 200 mL/hr over 30 Minutes Intravenous  Once 08/25/17 1534 08/26/17 1648   08/25/17 1503  ceFAZolin (ANCEF) 2-4 GM/100ML-% IVPB    Note to Pharmacy:  Sherle Poe   : cabinet override      08/25/17 1503 08/26/17 0314   08/23/17 1400  anidulafungin (ERAXIS) 100 mg in sodium chloride 0.9 % 100 mL IVPB     100 mg 78 mL/hr over 100 Minutes Intravenous Every 24 hours 08/22/17 1231     08/22/17 1400  anidulafungin (ERAXIS) 200 mg in sodium chloride 0.9 % 200 mL IVPB     200 mg 78 mL/hr over 200 Minutes Intravenous  Once 08/22/17 1231 08/22/17 1703      Medications: Scheduled Meds: . dexamethasone  4 mg Oral BID WC  . divalproex  500 mg Oral QHS  . feeding supplement (ENSURE ENLIVE)  237 mL Oral BID BM  . fentaNYL      . lidocaine      . magnesium oxide  400 mg Oral BID  . midazolam      . risperiDONE  1 mg Oral Daily   Continuous Infusions: . anidulafungin Stopped (08/26/17 1517)   PRN Meds:.acetaminophen **OR** acetaminophen, HYDROcodone-acetaminophen, ondansetron **OR** ondansetron (ZOFRAN) IV    Objective: Weight change:   Intake/Output Summary (Last 24 hours) at 08/26/2017 2146 Last data filed at 08/26/2017 1827 Gross per 24 hour  Intake 310 ml  Output 300 ml  Net 10 ml   Blood pressure 138/82, pulse 95, temperature 98.1 F (36.7 C), temperature source Oral, resp. rate 10, height 5\' 4"  (1.626 m), weight 159 lb 13.3 oz (72.5 kg), SpO2 90 %. Temp:  [97.5 F (36.4 C)-98.1 F (36.7 C)] 98.1 F (36.7 C) (05/07 1420) Pulse Rate:  [85-97] 95 (05/07 1600) Resp:  [10-18] 10 (05/07 1600) BP: (130-151)/(79-90) 138/82 (05/07 1600) SpO2:  [36 %-97 %] 90 % (05/07 1600)  Physical Exam: General: Alert and awake, oriented x3,  HEENT: anicteric sclera,  EOMI CVS  regular rate, normal r,  no murmur rubs or gallops Chest:  no wheezing, resp distress Abdomen, nondistended, soft Neuro: nonfocal  CBC:  CBC Latest Ref Rng & Units 08/25/2017 08/24/2017 08/23/2017  WBC 4.0 - 10.5 K/uL 17.3(H) 17.4(H) 17.9(H)  Hemoglobin 12.0 - 15.0 g/dL 9.4(L) 9.3(L) 9.5(L)  Hematocrit 36.0 - 46.0 % 28.8(L) 29.4(L) 30.1(L)  Platelets 150 - 400 K/uL 313 280 278      BMET Recent Labs    08/24/17 0420 08/25/17 0448  NA 137 138  K 3.9 3.6  CL 100* 100*  CO2 27 27  GLUCOSE 114* 101*  BUN 35* 39*  CREATININE 1.18* 1.33*  CALCIUM 8.2* 8.3*     Liver Panel  No results for input(s): PROT, ALBUMIN, AST, ALT, ALKPHOS, BILITOT, BILIDIR, IBILI in the last 72 hours.     Sedimentation Rate No results for input(s): ESRSEDRATE in the last 72 hours. C-Reactive Protein No results for input(s): CRP in the last 72 hours.  Micro Results: Recent Results (from the past 720 hour(s))  Urine Culture     Status: Abnormal   Collection Time: 08/11/17  1:24 PM  Result Value Ref Range Status   Specimen Description   Final    URINE,  RANDOM Performed at [facility], [place] [address], [place], [place] [address]    Special Requests   Final    NONE Performed at [facility], [place] [address] [place], [place] [address]    Culture MULTIPLE SPECIES PRESENT, SUGGEST RECOLLECTION (A)  Final   Report Status 08/13/2017 FINAL  Final  Blood Culture (routine x 2)     Status: Abnormal   Collection Time: 08/17/17  2:33 PM  Result Value Ref Range Status   Specimen Description   Final    BLOOD LEFT ANTECUBITAL Performed at [place] [address], [place], [place] [address]    Special Requests   Final    BOTTLES DRAWN AEROBIC AND ANAEROBIC Blood Culture adequate volume Performed at [place] [address] [place], [place] [address]    Culture  Setup Time   Final    YEAST AEROBIC BOTTLE ONLY CRITICAL  RESULT CALLED TO, READ BACK BY AND VERIFIED WITH: DR Lurline Del [id number] MLM Performed at [facility], [place] [address], [place], [place] [address]    Culture CANDIDA GLABRATA (A)  Final   Report Status 08/23/2017 FINAL  Final  Blood Culture ID Panel (Reflexed)     Status: Abnormal   Collection Time: 08/17/17  2:33 PM  Result Value Ref Range Status   Enterococcus species NOT DETECTED NOT DETECTED Final   Listeria monocytogenes NOT DETECTED NOT DETECTED Final   Staphylococcus species NOT DETECTED NOT DETECTED Final   Staphylococcus aureus NOT DETECTED NOT DETECTED Final   Streptococcus species NOT DETECTED NOT DETECTED Final   Streptococcus agalactiae NOT DETECTED NOT DETECTED Final   Streptococcus pneumoniae NOT DETECTED NOT DETECTED Final   Streptococcus pyogenes NOT DETECTED NOT DETECTED Final   Acinetobacter baumannii NOT DETECTED NOT DETECTED Final   Enterobacteriaceae species NOT DETECTED NOT DETECTED Final   Enterobacter cloacae complex NOT DETECTED NOT DETECTED Final   Escherichia coli NOT DETECTED NOT DETECTED Final   Klebsiella oxytoca NOT DETECTED NOT DETECTED Final   Klebsiella pneumoniae NOT DETECTED NOT DETECTED Final   Proteus species NOT DETECTED NOT DETECTED Final   Serratia marcescens NOT DETECTED NOT DETECTED Final   Haemophilus influenzae NOT DETECTED NOT DETECTED Final   Neisseria meningitidis NOT DETECTED NOT DETECTED Final   Pseudomonas aeruginosa NOT DETECTED NOT DETECTED Final   Candida albicans NOT DETECTED NOT DETECTED Final   Candida glabrata DETECTED (A) NOT DETECTED Final    Comment: CRITICAL RESULT CALLED TO, READ BACK BY AND VERIFIED WITH: DR Lurline Del [id number] MLM    Candida krusei NOT DETECTED NOT DETECTED Final   Candida parapsilosis NOT DETECTED NOT DETECTED Final   Candida tropicalis NOT DETECTED NOT DETECTED Final    Comment: Performed at [facility], [address], [place], [place] [address]  Blood Culture (routine x 2)      Status: None   Collection Time: 08/17/17  2:45 PM  Result Value Ref Range Status   Specimen Description   Final    BLOOD PORTA CATH Performed at [place] [address] [place], [place] [address]    Special Requests   Final    BOTTLES DRAWN AEROBIC AND ANAEROBIC Blood Culture results may not be optimal due to an excessive volume of blood received in culture bottles Performed at [place] [address], [place],  [address]    Culture   Final    NO  GROWTH 5 DAYS Performed at [facility], [place] [address] [place], [place] [address]    Report Status 08/22/2017 FINAL  Final  Urine culture     Status: None   Collection Time: 08/17/17  5:00 PM  Result Value Ref Range Status   Specimen Description   Final    URINE, RANDOM Performed at [place] [address] [place], [place] [address]    Special Requests   Final    NONE Performed at [facility], [place] [address], [place], [place] [address]    Culture   Final    NO GROWTH Performed at [facility], [place] [address], [place], [place] [address]    Report Status 08/19/2017 FINAL  Final  Blood culture (routine x 2)     Status: None (Preliminary result)   Collection Time: 08/22/17  1:00 PM  Result Value Ref Range Status   Specimen Description   Final    BLOOD LEFT ANTECUBITAL Performed at [place] [address], [place], [place] [address]    Special Requests   Final    BOTTLES DRAWN AEROBIC AND ANAEROBIC Blood Culture adequate volume Performed at [place] [address], [place], [place] [address]    Culture   Final    NO GROWTH 4 DAYS Performed at [facility], [place] [address] [place], [place] [address]    Report Status PENDING  Incomplete  Blood culture (routine x 2)     Status: None (Preliminary result)   Collection Time: 08/22/17  1:13 PM  Result Value Ref Range  Status   Specimen Description   Final    BLOOD RIGHT ANTECUBITAL Performed at [place] [address], [place], [place] [address]    Special Requests   Final    BOTTLES DRAWN AEROBIC AND ANAEROBIC Blood Culture adequate volume Performed at [place] [address] [place], [place] [address]    Culture   Final    NO GROWTH 4 DAYS Performed at [facility], [place] [address], [place], [place] [address]    Report Status PENDING  Incomplete    Studies/Results: No results found.    Assessment/Plan:  INTERVAL HISTORY: port removed later today   Active Problems:   Malnutrition (HCC)   Fungemia   Endometrial cancer (HCC)   Protein-calorie malnutrition, severe   Pressure injury of skin    Emily Duarte is a 67 y.o. 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 Her optho exam was clear She has had catheter removed  #1 Catheter associated C glabrata fungemia:  --repeat blood cultures --continue echinocandin therapy --DO NOT PLACE central line until we have proven clearance of her fungemia.   LOS: 4 days   Alcide Evener 08/26/2017, 9:46 PM

## 2017-08-26 NOTE — Progress Notes (Signed)
PROGRESS NOTE    Emily Duarte  IOX:[id number] DOB: 02/17/1951 DOA: 08/22/2017 PCP: Nolene Ebbs, MD   Brief Narrative:  HPI On 08/22/2017 by Dr. Riccardo Dubin [name] is a 67 y.o. 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  She had a recent hospitalization April 28 to May 1st, 2019 due to hypotension related to adrenal insufficiency, she was discharged in a stable condition on dexamethasone hormone supplemental therapy.  Since her discharge 48 hours ago she has been experiencing significant weakness, moderate to severe in intensity, worse with exertion, no improving factors, associated with poor oral intake, no fevers, no chills, no diarrhea, no nausea or vomiting.  Today her cultures from April 28 tested positive for Candida Glabrata, she was called to come back to the hospital for further therapy.  She does have a right internal jugular vein Port-A-Cath (May 2018), that she used for chemotherapy.  She received carboplatin and Taxol for 6 cycles.  Apparently she tolerated treatment very poorly with progressive weight loss and renal failure.  Currently on daily Doxil no further cisplatin.   Interim history Plan for removal of patient's port today.  Will need to redraw blood cultures thereafter.  Assessment & Plan   Sepsis secondary to fungemia secondary to Candida glabrata -Related to right internal jugular vein Port-A-Cath infection -On admission, patient had leukocytosis with tachycardia -Blood culture done on 08/17/2017 was positive for Candida -Repeat blood cultures 08/22/2017 showed no growth to date -Infectious disease consulted and appreciated-recommended port removal, with blood cultures obtained after removal of port and no central lines until clearance of fungemia.  Hold chemotherapy for at least 2 weeks from first date of negative blood cultures -Ophthalmology  consulted and appreciated-found no evidence of intraocular involvement.  Mild ocular hypertension recommended outpatient assessment -Continue Eraxis  -IR consulted for removal of port-to be done on 08/26/2017, today  Adrenal insufficiency -Patient was recently hospitalized for adrenal insufficiency and discharged on Aug 20, 2017 -Currently blood pressure stable  Left common femoral DVT -Diagnosed March 2019 -Xarelto held  Endometrial carcinoma cancer associated pain and severe protein calorie malnutrition -follows with Dr. Alvy Bimler -Continue dietary supplements and pain control  Depression/schizophrenia -Continue home medications  Normocytic anemia, chronic -hemoglobin currently 9.4, appears to be stable  -Continue to monitor CBC  Chronic kidney disease, stage III -Creatinine currently stable, continue to monitor BMP  Severe malnutrition -Nutrition consulted and appreciated, continue nutritional supplements  DVT Prophylaxis  heparin  Code Status: Full  Family Communication: None at bedside  Disposition Plan: Admitted, pending removal of port and repeat cultures  Consultants Infectious disease Interventional radiology Ophthalmology  Procedures  None  Antibiotics   Anti-infectives (From admission, onward)   Start     Dose/Rate Route Frequency Ordered Stop   08/26/17 1600  ceFAZolin (ANCEF) IVPB 2g/100 mL premix     2 g 200 mL/hr over 30 Minutes Intravenous  Once 08/25/17 1534     08/25/17 1503  ceFAZolin (ANCEF) 2-4 GM/100ML-% IVPB    Note to Pharmacy:  Sherle Poe   : cabinet override      08/25/17 1503 08/26/17 0314   08/23/17 1400  anidulafungin (ERAXIS) 100 mg in sodium chloride 0.9 % 100 mL IVPB     100 mg 78 mL/hr over 100 Minutes Intravenous Every 24 hours 08/22/17 1231     08/22/17 1400  anidulafungin (ERAXIS) 200 mg in sodium chloride 0.9 % 200 mL IVPB  200 mg 78 mL/hr over 200 Minutes Intravenous  Once 08/22/17 1231 08/22/17 1703       Subjective:   Emily Duarte seen and examined today.  Denies current chest pain, shortness breath, abdominal pain, nausea or vomiting, diarrhea constipation, dizziness or headache.  Objective:   Vitals:   08/25/17 1316 08/25/17 2140 08/26/17 0541 08/26/17 0606  BP: 133/81 139/88 (!) 151/90 130/80  Pulse: (!) 104 96 85 88  Resp: 16 18 18    Temp: 98.4 F (36.9 C) 98.7 F (37.1 C) (!) 97.5 F (36.4 C) 97.9 F (36.6 C)  TempSrc: Oral Oral Oral Oral  SpO2: 95% 96% 97% 97%  Weight:      Height:        Intake/Output Summary (Last 24 hours) at 08/26/2017 1154 Last data filed at 08/26/2017 1004 Gross per 24 hour  Intake 430 ml  Output 0 ml  Net 430 ml   Filed Weights   08/22/17 1233 08/24/17 0513  Weight: 76.2 kg (168 lb) 72.5 kg (159 lb 13.3 oz)   Exam  General: Well developed, chronically ill-appearing, NAD  HEENT: NCAT, mucous membranes moist.   Neck: Supple  Cardiovascular: S1 S2 auscultated, RRR, no murmur  Respiratory: Clear to auscultation bilaterally with equal chest rise  Abdomen: Soft, nontender, nondistended, + bowel sounds  Extremities: warm dry without cyanosis clubbing or edema  Neuro: AAOx3, nonfocal  Psych: flat affect however appropriate  Data Reviewed: I have personally reviewed following labs and imaging studies  CBC: Recent Labs  Lab 08/20/17 0457 08/22/17 1242 08/23/17 0504 08/24/17 0420 08/25/17 0448  WBC 17.0* 18.4* 17.9* 17.4* 17.3*  NEUTROABS  --  16.1* 15.0*  --   --   HGB 10.1* 10.2* 9.5* 9.3* 9.4*  HCT 30.5* 31.4* 30.1* 29.4* 28.8*  MCV 87.1 89.0 90.1 90.5 89.7  PLT 257 269 278 280 502   Basic Metabolic Panel: Recent Labs  Lab 08/20/17 0457 08/22/17 1242 08/23/17 0504 08/24/17 0420 08/25/17 0448  NA 138 137 135 137 138  K 4.8 4.3 4.5 3.9 3.6  CL 104 101 99* 100* 100*  CO2 23 27 26 27 27   GLUCOSE 95 111* 93 114* 101*  BUN 33* 30* 30* 35* 39*  CREATININE 1.17* 1.07* 1.12* 1.18* 1.33*  CALCIUM 8.0* 8.2* 8.5*  8.2* 8.3*   GFR: Estimated Creatinine Clearance: 39.5 mL/min (A) (by C-G formula based on SCr of 1.33 mg/dL (H)). Liver Function Tests: Recent Labs  Lab 08/23/17 0504  AST 26  ALT 20  ALKPHOS 103  BILITOT <0.1*  PROT 5.3*  ALBUMIN 1.6*   No results for input(s): LIPASE, AMYLASE in the last 168 hours. No results for input(s): AMMONIA in the last 168 hours. Coagulation Profile: No results for input(s): INR, PROTIME in the last 168 hours. Cardiac Enzymes: No results for input(s): CKTOTAL, CKMB, CKMBINDEX, TROPONINI in the last 168 hours. BNP (last 3 results) No results for input(s): PROBNP in the last 8760 hours. HbA1C: No results for input(s): HGBA1C in the last 72 hours. CBG: No results for input(s): GLUCAP in the last 168 hours. Lipid Profile: No results for input(s): CHOL, HDL, LDLCALC, TRIG, CHOLHDL, LDLDIRECT in the last 72 hours. Thyroid Function Tests: No results for input(s): TSH, T4TOTAL, FREET4, T3FREE, THYROIDAB in the last 72 hours. Anemia Panel: No results for input(s): VITAMINB12, FOLATE, FERRITIN, TIBC, IRON, RETICCTPCT in the last 72 hours. Urine analysis:    Component Value Date/Time   COLORURINE YELLOW 08/17/2017 [place] 08/17/2017 1700  LABSPEC 1.014 08/17/2017 1700   PHURINE 5.0 08/17/2017 1700   GLUCOSEU NEGATIVE 08/17/2017 1700   HGBUR NEGATIVE 08/17/2017 1700   BILIRUBINUR NEGATIVE 08/17/2017 1700   KETONESUR NEGATIVE 08/17/2017 1700   PROTEINUR NEGATIVE 08/17/2017 1700   UROBILINOGEN 0.2 07/12/2009 1846   NITRITE NEGATIVE 08/17/2017 1700   LEUKOCYTESUR NEGATIVE 08/17/2017 1700   Sepsis Labs: @LABRCNTIP (procalcitonin:4,lacticidven:4)  ) Recent Results (from the past 240 hour(s))  Blood Culture (routine x 2)     Status: Abnormal   Collection Time: 08/17/17  2:33 PM  Result Value Ref Range Status   Specimen Description   Final    BLOOD LEFT ANTECUBITAL Performed at [facility] [place], [place] [address]  [place], [place] [address]    Special Requests   Final    BOTTLES DRAWN AEROBIC AND ANAEROBIC Blood Culture adequate volume Performed at [place] [address] [place], [place] [address]    Culture  Setup Time   Final    YEAST AEROBIC BOTTLE ONLY CRITICAL RESULT CALLED TO, READ BACK BY AND VERIFIED WITH: DR Lurline Del [id number] MLM Performed at [facility], [place] [address] [place], [place] [address]    Culture CANDIDA GLABRATA (A)  Final   Report Status 08/23/2017 FINAL  Final  Blood Culture ID Panel (Reflexed)     Status: Abnormal   Collection Time: 08/17/17  2:33 PM  Result Value Ref Range Status   Enterococcus species NOT DETECTED NOT DETECTED Final   Listeria monocytogenes NOT DETECTED NOT DETECTED Final   Staphylococcus species NOT DETECTED NOT DETECTED Final   Staphylococcus aureus NOT DETECTED NOT DETECTED Final   Streptococcus species NOT DETECTED NOT DETECTED Final   Streptococcus agalactiae NOT DETECTED NOT DETECTED Final   Streptococcus pneumoniae NOT DETECTED NOT DETECTED Final   Streptococcus pyogenes NOT DETECTED NOT DETECTED Final   Acinetobacter baumannii NOT DETECTED NOT DETECTED Final   Enterobacteriaceae species NOT DETECTED NOT DETECTED Final   Enterobacter cloacae complex NOT DETECTED NOT DETECTED Final   Escherichia coli NOT DETECTED NOT DETECTED Final   Klebsiella oxytoca NOT DETECTED NOT DETECTED Final   Klebsiella pneumoniae NOT DETECTED NOT DETECTED Final   Proteus species NOT DETECTED NOT DETECTED Final   Serratia marcescens NOT DETECTED NOT DETECTED Final   Haemophilus influenzae NOT DETECTED NOT DETECTED Final   Neisseria meningitidis NOT DETECTED NOT DETECTED Final   Pseudomonas aeruginosa NOT DETECTED NOT DETECTED Final   Candida albicans NOT DETECTED NOT DETECTED Final   Candida glabrata DETECTED (A) NOT DETECTED Final    Comment: CRITICAL RESULT CALLED TO, READ BACK BY AND VERIFIED WITH: DR Lurline Del [id number] MLM      Candida krusei NOT DETECTED NOT DETECTED Final   Candida parapsilosis NOT DETECTED NOT DETECTED Final   Candida tropicalis NOT DETECTED NOT DETECTED Final    Comment: Performed at [facility], [address], [place], [place] [address]  Blood Culture (routine x 2)     Status: None   Collection Time: 08/17/17  2:45 PM  Result Value Ref Range Status   Specimen Description   Final    BLOOD PORTA CATH Performed at [place] [address], [place], [place] [address]    Special Requests   Final    BOTTLES DRAWN AEROBIC AND ANAEROBIC Blood Culture results may not be optimal due to an excessive volume of blood received in culture bottles Performed at [place] [address]  [place], [place] [address]    Culture   Final    NO GROWTH 5 DAYS Performed at [facility], [place] [address], [place], [place] [address]    Report Status 08/22/2017 FINAL  Final  Urine culture     Status: None   Collection Time: 08/17/17  5:00 PM  Result Value Ref Range Status   Specimen Description   Final    URINE, RANDOM Performed at [place] [address], [place], [place] [address]    Special Requests   Final    NONE Performed at [facility], [place] [address] [place], [place] [address]    Culture   Final    NO GROWTH Performed at [facility], [place] [address] [place], [place] [address]    Report Status 08/19/2017 FINAL  Final  Blood culture (routine x 2)     Status: None (Preliminary result)   Collection Time: 08/22/17  1:00 PM  Result Value Ref Range Status   Specimen Description   Final    BLOOD LEFT ANTECUBITAL Performed at [facility] [address], [place], [place] [address]    Special Requests   Final    BOTTLES DRAWN AEROBIC AND ANAEROBIC Blood Culture adequate volume Performed at [facility] [address] [place], [place] [address]     Culture   Final    NO GROWTH 3 DAYS Performed at [facility], [place] [address], [place], [place] [address]    Report Status PENDING  Incomplete  Blood culture (routine x 2)     Status: None (Preliminary result)   Collection Time: 08/22/17  1:13 PM  Result Value Ref Range Status   Specimen Description   Final    BLOOD RIGHT ANTECUBITAL Performed at [facility] [address], [place], [place] [address]    Special Requests   Final    BOTTLES DRAWN AEROBIC AND ANAEROBIC Blood Culture adequate volume Performed at [place] [address] [place], [place] [address]    Culture   Final    NO GROWTH 3 DAYS Performed at [facility], [place] [address], [place], [place] [address]    Report Status PENDING  Incomplete      Radiology Studies: No results found.   Scheduled Meds: . dexamethasone  4 mg Oral BID WC  . divalproex  500 mg Oral QHS  . feeding supplement (ENSURE ENLIVE)  237 mL Oral BID BM  . magnesium oxide  400 mg Oral BID  . risperiDONE  1 mg Oral Daily   Continuous Infusions: . anidulafungin Stopped (08/25/17 1644)  .  ceFAZolin (ANCEF) IV       LOS: 4 days   Time Spent in minutes   30 minutes   Keasia Dubose D.O. on 08/26/2017 at 11:54 AM  Between 7am to 7pm - Pager - [phone]  After 7pm go to [email or web address] - password [email or web address]  And look for the night coverage person covering for me after hours  [facility] Office  [phone]

## 2017-08-26 NOTE — Progress Notes (Signed)
Physical Therapy Treatment Patient Details Name: Emily Duarte MRN: [id number] DOB: 03-Nov-1950 Today's Date: 08/26/2017    History of Present Illness Pt admitted with fungemia.  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  Pt present with L LE weakness.  Pt plans to return to SNF.  Follow Up Recommendations  SNF     Equipment Recommendations  None recommended by PT    Recommendations for Other Services       Precautions / Restrictions Precautions Precautions: Fall Restrictions Weight Bearing Restrictions: No    Mobility  Bed Mobility Overal bed mobility: Needs Assistance Bed Mobility: Supine to Sit;Sit to Supine     Supine to sit: Min assist Sit to supine: Min assist   General bed mobility comments: increased time and assist B LE  Transfers Overall transfer level: Needs assistance Equipment used: Rolling walker (2 wheeled) Transfers: Sit to/from [facility] Sit to Stand: Mod assist   Squat pivot transfers: Mod assist     General transfer comment: multiple attempts then from elevated bed required increased assist and lean on walker for support.    Ambulation/Gait Ambulation/Gait assistance: Min assist;Mod assist Ambulation Distance (Feet): 34 Feet Assistive device: Rolling walker (2 wheeled) Gait Pattern/deviations: Step-to pattern;Decreased stance time - left Gait velocity: decreased   General Gait Details: decreased strength L LE with knee buckle.  Limited distance due to weakness and fatigue.     Stairs             Wheelchair Mobility    Modified Rankin (Stroke Patients Only)       Balance                                            Cognition Arousal/Alertness: Awake/alert Behavior During Therapy: Flat affect Overall Cognitive Status: Within Functional Limits for tasks assessed                                 General Comments: some repetitive statements      Exercises      General Comments        Pertinent Vitals/Pain Pain Assessment: No/denies pain    Home Living                      Prior Function            PT Goals (current goals can now be found in the care plan section) Progress towards PT goals: Progressing toward goals    Frequency    Min 2X/week      PT Plan Current plan remains appropriate    Co-evaluation              AM-PAC PT "6 Clicks" Daily Activity  Outcome Measure  Difficulty turning over in bed (including adjusting bedclothes, sheets and blankets)?: A Lot Difficulty moving from lying on back to sitting on the side of the bed? : A Lot Difficulty sitting down on and standing up from a chair with arms (e.g., wheelchair, bedside commode, etc,.)?: A Lot Help needed moving to and from a bed to chair (including a wheelchair)?: A Lot Help needed walking in hospital room?: A Lot Help needed climbing 3-5 steps with  a railing? : A Lot 6 Click Score: 12    End of Session Equipment Utilized During Treatment: Gait belt Activity Tolerance: Patient limited by fatigue Patient left: in bed;with call bell/phone within reach;with bed alarm set Nurse Communication: Mobility status PT Visit Diagnosis: Muscle weakness (generalized) (M62.81);Difficulty in walking, not elsewhere classified (R26.2)     Time: 8138-8719 PT Time Calculation (min) (ACUTE ONLY): 25 min  Charges:  $Gait Training: 8-22 mins $Therapeutic Activity: 8-22 mins                    G Codes:       Rica Koyanagi  PTA [facility]  Acute  Rehab Pager      [phone]

## 2017-08-26 NOTE — Progress Notes (Signed)
Patient will return to [facility] at discharge. CSW confirmed with patient son.   Kathrin Greathouse, Latanya Presser, MSW Clinical Social Worker  [phone] 08/26/2017  11:46 AM

## 2017-08-26 NOTE — Progress Notes (Signed)
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[place]) She tolerated treatment very poorly with progressive weight loss and acute on chronic renal failure, recurrent hospitalization and now life-threatening infection  The last dose of chemotherapy was on 08/01/2017 Recent tumor markers were elevated It is not clear to me that the patient continues to respond to treatment I spoke with her son extensively over the phone and we discussed poor prognosis Her son would like to know the state of the disease to make informed decision whether to enroll her in hospice in the future  We discussed the timing of imaging study Ultimately, he would like her to get staging imaging study while she is hospitalized I will order CT scan of the chest, abdomen and pelvis with IV contrast along with tumor markers for further evaluation  Anemia, chronic disease She has received recent blood transfusion She is not symptomatic today She denies further bleeding  Acute prerenal failure ([place]), resolved She has acute on chronic renal failure likely secondary to poor oral fluid intake and dehydration  Fungal infection The port is removed.  Protein-calorie malnutrition, severe (HCC) The cause of protein calorie malnutrition is due to poor oral intake and progression of cancer She has been seen by dietitian  Left femoral vein DVT ([place]) I discussed extensively with her son She is on anticoagulation therapy and the risk of bleeding has to be balanced with the benefits of anticoagulation treatment For now, we will continue Xarelto at reduced dose  Cancer associated pain She has moderate cancer pain, currently well controlled Her pain is stable  Goals of care discussion The patient's mental health illness precluded meaningful discussion about goals of care Her son is her dedicated healthcare power of attorney I will continue to update the son once  I have more information available I discussed poor prognosis given rapid disease progression  Discharge planning The patient is close to being discharged to skilled nursing facility  Heath Lark, MD 08/26/2017  4:46 PM   Subjective: The patient is well-known to me I was informed that the patient is admitted to the hospital feeling unwell.  She was subsequently found to have fungemia.  Port is removed Overall, she felt a bit better.  She denies further vaginal bleeding.  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 on 08/01/16 with postmenopausal vaginal bleeding lasting 10 days. However, she was transferred to [facility] on 08/06/16 for suicidal ideation. There, GYN was consulted and ordered biopsy. Pt was transferred to medical unit for blood transfusion.       08/10/2016 Imaging    US pelvis:  The uterus is prominent and heterogeneous measuring 10.9 x 6.3 x 6.1 cm. There is a probable anterior exophytic fundal fibroid measuring 4.5 cm. The endometrial stripe is normal in thickness at 6.4 mm. I see no fluid in the canal  Right ovary is not visible. Left ovary 2.3 x 1.7 x 1.9 cm with normal echogenicity. Color flow and spectral Doppler: Normal arterial inflow and venous outflow.  There is no significant free fluid.   I see no adnexal masses.      08/11/2016 - 08/15/2016 Hospital Admission    She  was admitted voluntarily to Psychiatry unit 08/06/2016 for suicidal ideation.She was evaluated in an outside facility due to vaginal bleeding and endometrial and endocervical biopsies performed. Pathology revealed a poorly differentiated cancer with positive estrogen receptors felt to be suggestive of endometrial cancer. The patient received 2  units of prbcs total during her stay for anemia.        08/11/2016 Pathology Results       1. Endocervix, curettings and polyp. (*please see comment below). --High-grade carcinoma, undifferentiated.  2. Endometrial biopsy:(*please see comment below). --High-grade carcinoma, undifferentiated. --Weakly positive for estrogen receptors suggestive of endometrial origin; Negative for progesterone receptors and neutral mucin..       08/14/2016 Imaging    CT imaging 1. Uterine mass and abnormality consistent with clinical history of endometrial carcinoma as described above with bilateral pelvic and retroperitoneal adenopathy consistent with nodal metastasis. 2. No evidence of nonnodal distant metastasis. 3. Nonspecific incidental finding of hypertrophy of the pylorus muscle. 4. incidental finding of cholelithiasis      09/04/2016 Imaging    CT chest: 1. 4 mm nodule in the anterior aspect of the right upper lobe. This is highly nonspecific. Although metastatic disease is not excluded, it is not strongly favored. Attention under routine followup examinations is recommended to ensure the stability or resolution of this finding. 2. Trace right pleural effusion lying dependently is simple in appearance. 3. Mild cardiomegaly.      09/05/2016 Procedure    Successful placement of a right internal jugular approach power injectable Port-A-Cath. The catheter is ready for immediate use.      09/05/2016 Tumor Marker    Patient's tumor was tested for the following markers: CA125 Results of the tumor marker test revealed 1150      09/09/2016 - 12/30/2016 Chemotherapy    She received chemotherapy with carboplatin and Taxol x 6 cycles      09/30/2016 Tumor Marker    Patient's tumor was tested for the following markers: CA125 Results of the tumor marker test revealed 338.1      10/28/2016 Tumor Marker    Patient's tumor was tested for the following markers: CA125 Results of the tumor marker test revealed  210.9      11/08/2016 Imaging    CT scan abdomen and pelvis 1. Soft tissue nodularity throughout the omental and perihepatic fat worrisome for peritoneal carcinomatosis. No ascites. 2. No other definitive evidence of metastatic disease. There are prominent left pelvic sidewall and right inguinal lymph nodes. 3. Nonspecific findings in the uterus, likely related to treated tumor and fibroids. Prior pelvic imaging unavailable. 4. Cholelithiasis.      12/09/2016 Tumor Marker    Patient's tumor was tested for the following markers: CA125 Results of the tumor marker test revealed 98.1      12/30/2016 Tumor Marker    Patient's tumor was tested for the following markers: CA125 Results of the tumor marker test revealed 106.1      01/07/2017 Imaging    1. Stable soft tissue nodularity along the omentum suspicious for peritoneal spread of tumor. Stable appearance of nodularity along the uterine fundus and indistinctness along the right adnexa, some of this may be due to fibroids. 2. The previously borderline enlarged left external iliac lymph node is now normal in size. 3. Stable 4 mm ill-defined right upper lobe pulmonary nodule anteriorly, this may well in depth being benign but merits surveillance. 4. Cholelithiasis.      03/03/2017 Tumor Marker    Patient's tumor was tested  for the following markers: CA125 Results of the tumor marker test revealed 694.5      03/07/2017 Imaging    1. Clear interval progression of peritoneal and omental disease as above. Diffuse mesenteric thickening and nodularity has progressed. 2. New juxta diaphragmatic lymph nodes compatible with metastatic disease. 3. Cholelithiasis. 4. Uterine enlargement with probable fibroid change. Ill-defined hypovascular lesion in the lower uterine segment/cervix may represent primary malignancy although this is difficult to assess by CT.      03/18/2017 Imaging    ECHO: Normal LV size with EF 55-60%. Normal RV size and  systolic function. Mild MR.      06/06/2017 Tumor Marker    Patient's tumor was tested for the following markers: CA125 Results of the tumor marker test revealed 710.5      06/20/2017 Imaging    1. Slightly improved omental disease. Scattered peritoneal surface disease and mesenteric disease appears relatively stable. 2. New cystic lesion peripherally in the right hepatic lobe could be treated peritoneal surface disease. 3. Persistent extensive endometrial neoplasm involving the lower uterine segment and cervix. 4. Cholelithiasis. 5. Moderate-sized right effusion with overlying atelectasis but no enhancing pleural nodules or pulmonary nodules. 6. Deep venous thrombosis in the left common femoral vein.      06/20/2017 Imaging    LV EF: 60% -  65%      07/01/2017 Tumor Marker    Patient's tumor was tested for the following markers: CA125 Results of the tumor marker test revealed 493.4      08/01/2017 Tumor Marker    Patient's tumor was tested for the following markers: CA125 Results of the tumor marker test revealed 955.6        Objective:  Vitals:   08/26/17 1555 08/26/17 1600  BP: 132/79 138/82  Pulse: 94 95  Resp: 15 10  Temp:    SpO2: (!) 36% 90%     Intake/Output Summary (Last 24 hours) at 08/26/2017 1646 Last data filed at 08/26/2017 1500 Gross per 24 hour  Intake 560 ml  Output 0 ml  Net 560 ml    GENERAL:alert, no distress and comfortable.  She looks thin and pale SKIN: skin color is pale, texture, turgor are normal, no rashes or significant lesions EYES: normal, Conjunctiva are pale and non-injected, sclera clear OROPHARYNX:dry mucous membrane is noted NECK: supple, thyroid normal size, non-tender, without nodularity LYMPH:  no palpable lymphadenopathy in the cervical, axillary or inguinal LUNGS: clear to auscultation and percussion with normal breathing effort HEART: regular rate & rhythm and no murmurs mild bilateral lower extremity edema ABDOMEN:abdomen  soft, palpable abdominal mass with carcinomatosis, nondistended Musculoskeletal:no cyanosis of digits and no clubbing  NEURO: alert & oriented x 3 with fluent speech, no focal motor/sensory deficits   Labs:  Lab Results  Component Value Date   WBC 17.3 (H) 08/25/2017   HGB 9.4 (L) 08/25/2017   HCT 28.8 (L) 08/25/2017   MCV 89.7 08/25/2017   PLT 313 08/25/2017   NEUTROABS 15.0 (H) 08/23/2017    Lab Results  Component Value Date   NA 138 08/25/2017   K 3.6 08/25/2017   CL 100 (L) 08/25/2017   CO2 27 08/25/2017

## 2017-08-26 NOTE — Procedures (Signed)
Interventional Radiology Procedure Note  Procedure: Removal of right IJ port.  .  Complications: None Recommendations:  - Ok to shower tomorrow - Do not submerge for 7 days - Routine care   Signed,  Naomii Kreger S. Edgar Reisz, DO    

## 2017-08-27 ENCOUNTER — Encounter (HOSPITAL_COMMUNITY): Payer: Self-pay | Admitting: Interventional Radiology

## 2017-08-27 DIAGNOSIS — B377 Candidal sepsis: Secondary | ICD-10-CM

## 2017-08-27 LAB — BASIC METABOLIC PANEL
ANION GAP: 13 (ref 5–15)
BUN: 31 mg/dL — AB (ref 6–20)
CHLORIDE: 97 mmol/L — AB (ref 101–111)
CO2: 28 mmol/L (ref 22–32)
Calcium: 8.4 mg/dL — ABNORMAL LOW (ref 8.9–10.3)
Creatinine, Ser: 1.2 mg/dL — ABNORMAL HIGH (ref 0.44–1.00)
GFR calc Af Amer: 53 mL/min — ABNORMAL LOW (ref >60)
GFR calc non Af Amer: 45 mL/min — ABNORMAL LOW (ref >60)
Glucose, Bld: 106 mg/dL — ABNORMAL HIGH (ref 65–99)
POTASSIUM: 3.9 mmol/L (ref 3.5–5.1)
SODIUM: 138 mmol/L (ref 135–145)

## 2017-08-27 LAB — CBC
HCT: 28.9 % — ABNORMAL LOW (ref 36.0–46.0)
HEMOGLOBIN: 9.2 g/dL — AB (ref 12.0–15.0)
MCH: 29.1 pg (ref 26.0–34.0)
MCHC: 31.8 g/dL (ref 30.0–36.0)
MCV: 91.5 fL (ref 78.0–100.0)
Platelets: 330 10*3/uL (ref 150–400)
RBC: 3.16 MIL/uL — AB (ref 3.87–5.11)
RDW: 17.6 % — ABNORMAL HIGH (ref 11.5–15.5)
WBC: 19.4 10*3/uL — ABNORMAL HIGH (ref 4.0–10.5)

## 2017-08-27 LAB — CULTURE, BLOOD (ROUTINE X 2)
CULTURE: NO GROWTH
Culture: NO GROWTH
Special Requests: ADEQUATE
Special Requests: ADEQUATE

## 2017-08-27 NOTE — Progress Notes (Addendum)
PROGRESS NOTE    Emily Duarte  NGE:[id number] DOB: 12-10-1950 DOA: 08/22/2017 PCP: Nolene Ebbs, MD     Brief Narrative:  HPI On 08/22/2017 by Dr. Riccardo Dubin [name] a 67 y.o.femalewith medical history significant ofendometrial cancer, hypertension, schizophrenia, left common femoral vein deep vein thrombosis. She had a recent hospitalization April 28 to May 1st,2019 due to hypotension related to adrenal insufficiency;she was discharged in a stable condition on dexamethasone hormone supplemental therapy.Since her discharge 48 hours ago, she has been experiencing significant weakness,moderate to severe in intensity,worse with exertion, no improving factors, associated with poor oral intake, no fevers, no chills, no diarrhea, no nauseaorvomiting. Her blood cultures from April 28 tested positive for Candida Christ Kick was called to come back to the hospital for further therapy. She does have a right internal jugular vein Port-A-Cath(May 2018),that she used for chemotherapy. She received carboplatin and Taxol for 6 cycles.Apparently she tolerated treatment very poorly with progressive weight loss and renal failure. Currently on daily Doxil no further cisplatin.  Interim history: Infectious disease was consulted and patient started on Eraxis. Port-A-Cath was removed by IR on 5/7. She was evaluated by oncology and pending CT chest/abd/pelvis to evaluate for cancer progression.  Assessment & Plan:   Principal Problem:   Fungemia Active Problems:   Malnutrition ([place])   Endometrial cancer ([place])   Protein-calorie malnutrition, severe   Pressure injury of skin   Immunocompromised due to corticosteroids   Sepsis secondary to fungemia secondary to Candida glabrata -Related to right internal jugular vein Port-A-Cath infection, removed on 5/7  -Blood culture done on 08/17/2017 was positive for Candida -Repeat blood cultures 08/22/2017 showed no growth to  date -Infectious disease consulted and appreciated-recommended port removal, with blood cultures obtained after removal of port and no central lines until clearance of fungemia.  Hold chemotherapy for at least 2 weeks from first date of negative blood cultures -Ophthalmology consulted and appreciated, found no evidence of intraocular involvement.  Mild ocular hypertension recommended outpatient assessment -Continue Eraxis   Adrenal insufficiency -Patient was recently hospitalized for adrenal insufficiency and discharged on Aug 20, 2017 -Currently blood pressure stable, continue decadron   Left common femoral DVT -Diagnosed March 2019 -Xarelto held due to active bleeding   Endometrial carcinoma cancer associated pain and severe protein calorie malnutrition -Follows with Dr. Alvy Bimler, last chemo 08/01/17  -Pending CT chest/abd/pelvis result to evaluate for cancer progression   Depression/schizophrenia -Continue depakote, risperdal   Normocytic anemia, chronic -Hgb stable  -Continue to monitor CBC  Chronic kidney disease, stage III -Creatinine stable -Continue to monitor BMP  Severe malnutrition -Nutrition consulted and appreciated, continue nutritional supplements  Stage 2 pressure injury of buttock, POA -Supportive care, off loading and turns    DVT prophylaxis: subq hep Code Status: Full Family Communication: No family at bedside Disposition Plan: Pending CT results, further oncology discussion with patient and family, infectious disease recommendations. Will [facility] to [facility] on discharge.    Consultants:   Infectious disease  IR  Ophthalmology  Oncology  Procedures:   Port-A-Cath removal 5/7   Antimicrobials:  Anti-infectives (From admission, onward)   Start     Dose/Rate Route Frequency Ordered Stop   08/26/17 1600  ceFAZolin (ANCEF) IVPB 2g/100 mL premix     2 g 200 mL/hr over 30 Minutes Intravenous  Once 08/25/17 1534 08/26/17 1648    08/25/17 1503  ceFAZolin (ANCEF) 2-4 GM/100ML-% IVPB    Note to Pharmacy:  Sherle Poe   : cabinet  override      08/25/17 1503 08/26/17 0314   08/23/17 1400  anidulafungin (ERAXIS) 100 mg in sodium chloride 0.9 % 100 mL IVPB     100 mg 78 mL/hr over 100 Minutes Intravenous Every 24 hours 08/22/17 1231     08/22/17 1400  anidulafungin (ERAXIS) 200 mg in sodium chloride 0.9 % 200 mL IVPB     200 mg 78 mL/hr over 200 Minutes Intravenous  Once 08/22/17 1231 08/22/17 1703       Subjective: Patient without any complaints this morning. She denies fevers, chills, chest pain, SOB, N/V, abdominal pain.   Objective: Vitals:   08/26/17 1555 08/26/17 1600 08/26/17 2147 08/27/17 0519  BP: 132/79 138/82 127/82 129/77  Pulse: 94 95 (!) 102 90  Resp: 15 10 16 16   Temp:   98.6 F (37 C) (!) 97.4 F (36.3 C)  TempSrc:   Oral Oral  SpO2: (!) 36% 90% 93% 94%  Weight:      Height:        Intake/Output Summary (Last 24 hours) at 08/27/2017 0903 Last data filed at 08/27/2017 0724 Gross per 24 hour  Intake 370 ml  Output 1200 ml  Net -830 ml   Filed Weights   08/22/17 1233 08/24/17 0513  Weight: 76.2 kg (168 lb) 72.5 kg (159 lb 13.3 oz)    Examination:  General exam: Appears calm and comfortable  Respiratory system: Clear to auscultation. Respiratory effort normal. Cardiovascular system: S1 & S2 heard, RRR. No JVD, murmurs, rubs, gallops or clicks. No pedal edema. Gastrointestinal system: Abdomen is nondistended, soft and nontender. No organomegaly or masses felt. Normal bowel sounds heard. Central nervous system: Alert and oriented. No focal neurological deficits. Extremities: Symmetric  Skin: No rashes, lesions or ulcers, +right chest wall dressing clean and dry  Psychiatry: Judgement and insight appear stable, +flat affect, hx of depression/schizophrenia  Data Reviewed: I have personally reviewed following labs and imaging studies  CBC: Recent Labs  Lab 08/22/17 1242  08/23/17 0504 08/24/17 0420 08/25/17 0448 08/27/17 0434  WBC 18.4* 17.9* 17.4* 17.3* 19.4*  NEUTROABS 16.1* 15.0*  --   --   --   HGB 10.2* 9.5* 9.3* 9.4* 9.2*  HCT 31.4* 30.1* 29.4* 28.8* 28.9*  MCV 89.0 90.1 90.5 89.7 91.5  PLT 269 278 280 313 188   Basic Metabolic Panel: Recent Labs  Lab 08/22/17 1242 08/23/17 0504 08/24/17 0420 08/25/17 0448 08/27/17 0434  NA 137 135 137 138 138  K 4.3 4.5 3.9 3.6 3.9  CL 101 99* 100* 100* 97*  CO2 27 26 27 27 28   GLUCOSE 111* 93 114* 101* 106*  BUN 30* 30* 35* 39* 31*  CREATININE 1.07* 1.12* 1.18* 1.33* 1.20*  CALCIUM 8.2* 8.5* 8.2* 8.3* 8.4*   GFR: Estimated Creatinine Clearance: 43.8 mL/min (A) (by C-G formula based on SCr of 1.2 mg/dL (H)). Liver Function Tests: Recent Labs  Lab 08/23/17 0504  AST 26  ALT 20  ALKPHOS 103  BILITOT <0.1*  PROT 5.3*  ALBUMIN 1.6*   No results for input(s): LIPASE, AMYLASE in the last 168 hours. No results for input(s): AMMONIA in the last 168 hours. Coagulation Profile: No results for input(s): INR, PROTIME in the last 168 hours. Cardiac Enzymes: No results for input(s): CKTOTAL, CKMB, CKMBINDEX, TROPONINI in the last 168 hours. BNP (last 3 results) No results for input(s): PROBNP in the last 8760 hours. HbA1C: No results for input(s): HGBA1C in the last 72 hours. CBG: No results for  input(s): GLUCAP in the last 168 hours. Lipid Profile: No results for input(s): CHOL, HDL, LDLCALC, TRIG, CHOLHDL, LDLDIRECT in the last 72 hours. Thyroid Function Tests: No results for input(s): TSH, T4TOTAL, FREET4, T3FREE, THYROIDAB in the last 72 hours. Anemia Panel: No results for input(s): VITAMINB12, FOLATE, FERRITIN, TIBC, IRON, RETICCTPCT in the last 72 hours. Sepsis Labs: No results for input(s): PROCALCITON, LATICACIDVEN in the last 168 hours.  Recent Results (from the past 240 hour(s))  Blood Culture (routine x 2)     Status: Abnormal   Collection Time: 08/17/17  2:33 PM  Result Value Ref  Range Status   Specimen Description   Final    BLOOD LEFT ANTECUBITAL Performed at [facility] [address] [place], [place] [address]    Special Requests   Final    BOTTLES DRAWN AEROBIC AND ANAEROBIC Blood Culture adequate volume Performed at [facility] [address] [place], [place] [address]    Culture  Setup Time   Final    YEAST AEROBIC BOTTLE ONLY CRITICAL RESULT CALLED TO, READ BACK BY AND VERIFIED WITH: DR Lurline Del [phone] MLM Performed at [facility], [place] [address], [place], [place] [address]    Culture CANDIDA GLABRATA (A)  Final   Report Status 08/23/2017 FINAL  Final  Blood Culture ID Panel (Reflexed)     Status: Abnormal   Collection Time: 08/17/17  2:33 PM  Result Value Ref Range Status   Enterococcus species NOT DETECTED NOT DETECTED Final   Listeria monocytogenes NOT DETECTED NOT DETECTED Final   Staphylococcus species NOT DETECTED NOT DETECTED Final   Staphylococcus aureus NOT DETECTED NOT DETECTED Final   Streptococcus species NOT DETECTED NOT DETECTED Final   Streptococcus agalactiae NOT DETECTED NOT DETECTED Final   Streptococcus pneumoniae NOT DETECTED NOT DETECTED Final   Streptococcus pyogenes NOT DETECTED NOT DETECTED Final   Acinetobacter baumannii NOT DETECTED NOT DETECTED Final   Enterobacteriaceae species NOT DETECTED NOT DETECTED Final   Enterobacter cloacae complex NOT DETECTED NOT DETECTED Final   Escherichia coli NOT DETECTED NOT DETECTED Final   Klebsiella oxytoca NOT DETECTED NOT DETECTED Final   Klebsiella pneumoniae NOT DETECTED NOT DETECTED Final   Proteus species NOT DETECTED NOT DETECTED Final   Serratia marcescens NOT DETECTED NOT DETECTED Final   Haemophilus influenzae NOT DETECTED NOT DETECTED Final   Neisseria meningitidis NOT DETECTED NOT DETECTED Final   Pseudomonas aeruginosa NOT DETECTED NOT DETECTED Final   Candida albicans NOT DETECTED NOT DETECTED Final   Candida  glabrata DETECTED (A) NOT DETECTED Final    Comment: CRITICAL RESULT CALLED TO, READ BACK BY AND VERIFIED WITH: DR Lurline Del [phone] MLM    Candida krusei NOT DETECTED NOT DETECTED Final   Candida parapsilosis NOT DETECTED NOT DETECTED Final   Candida tropicalis NOT DETECTED NOT DETECTED Final    Comment: Performed at [facility], [address], [place], [place] [address]  Blood Culture (routine x 2)     Status: None   Collection Time: 08/17/17  2:45 PM  Result Value Ref Range Status   Specimen Description   Final    BLOOD PORTA CATH Performed at [facility] [address], [place], [place] [address]    Special Requests   Final    BOTTLES DRAWN AEROBIC AND ANAEROBIC Blood Culture results may not be optimal due to an excessive volume of blood received in culture bottles Performed at [facility]  [facility], [place] [address] [place], [place] [address]    Culture   Final    NO GROWTH 5 DAYS Performed at [facility], [place] [address] [place], [place] [address]    Report Status 08/22/2017 FINAL  Final  Urine culture     Status: None   Collection Time: 08/17/17  5:00 PM  Result Value Ref Range Status   Specimen Description   Final    URINE, RANDOM Performed at [facility] [address], [place], [place] [address]    Special Requests   Final    NONE Performed at [facility], [place] [address], [place], [place] [address]    Culture   Final    NO GROWTH Performed at [facility], [place] [address], [place], [place] [address]    Report Status 08/19/2017 FINAL  Final  Blood culture (routine x 2)     Status: None (Preliminary result)   Collection Time: 08/22/17  1:00 PM  Result Value Ref Range Status   Specimen Description   Final    BLOOD LEFT ANTECUBITAL Performed at [facility] [address] [place], [place] [address]    Special Requests   Final    BOTTLES DRAWN AEROBIC  AND ANAEROBIC Blood Culture adequate volume Performed at [facility] [address], [place], [place] [address]    Culture   Final    NO GROWTH 4 DAYS Performed at [facility], [place] [address] [place], [place] [address]    Report Status PENDING  Incomplete  Blood culture (routine x 2)     Status: None (Preliminary result)   Collection Time: 08/22/17  1:13 PM  Result Value Ref Range Status   Specimen Description   Final    BLOOD RIGHT ANTECUBITAL Performed at [facility] [address] [place], [place] [address]    Special Requests   Final    BOTTLES DRAWN AEROBIC AND ANAEROBIC Blood Culture adequate volume Performed at [facility] [address] [place], [place] [address]    Culture   Final    NO GROWTH 4 DAYS Performed at [facility], [place] [address] [place], [place] [address]    Report Status PENDING  Incomplete       Radiology Studies: No results found.    Scheduled Meds: . dexamethasone  4 mg Oral BID WC  . divalproex  500 mg Oral QHS  . feeding supplement (ENSURE ENLIVE)  237 mL Oral BID BM  . magnesium oxide  400 mg Oral BID  . risperiDONE  1 mg Oral Daily   Continuous Infusions: . anidulafungin Stopped (08/26/17 1517)     LOS: 5 days    Time spent: 35 minutes   Dessa Phi, DO [facility] [email or web address] Password [email or web address] 08/27/2017, 9:03 AM

## 2017-08-27 NOTE — Progress Notes (Signed)
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[place]) She tolerated treatment very poorly with progressive weight loss and acute on chronic renal failure, recurrent hospitalization and now life-threatening infection  The last dose of chemotherapy was on 08/01/2017 Recent tumor markers were elevated It is not clear to me that the patient continues to respond to treatment I spoke with her son extensively over the phone and we discussed poor prognosis Her son would like to know the state of the disease to make informed decision whether to enroll her in hospice in the future  Per discussion yesterday, I have ordered a CT imaging, pending to be done  The recurrence of vaginal bleeding and my clinical examination yesterday are worrisome for cancer progression  Anemia, chronic disease She has received recent blood transfusion She is not symptomatic today  Acute prerenal failure ([place]), resolved She has acute on chronic renal failure likely secondary to poor oral fluid intake and dehydration  Fungal infection The port is removed  I appreciate input from infectious disease  Protein-calorie malnutrition, severe ([place]) The cause of protein calorie malnutrition is due to poor oral intake and progression of cancer She has been seen by dietitian  Left femoral vein DVT ([place]) I discussed extensively with her son She is on anticoagulation therapy and the risk of bleeding has to be balanced with the benefits of anticoagulation treatment Due to active bleeding, I recommend holding off anticoagulation for now  Cancer associated pain She has moderate cancer pain, currently well controlled Her pain is stable  Goals of care discussion The patient's mental health illness precluded meaningful discussion about goals of care Her son is her dedicated healthcare power of attorney I will continue to update the son once I have more  information available I discussed poor prognosis given rapid disease progression  Discharge planning The patient is close to being discharged to skilled nursing facility but not ready due to active bleeding and awaiting final recommendation from infectious disease, pending repeat blood cultures I will return to check on her tomorrow Hopefully, we will have further imaging studies to guide decision making  Heath Lark, MD 08/27/2017  8:09 AM   Subjective:  The patient was sitting on the commode when I walked in.  According to nursing staff, she has significant vaginal bleeding noted on her bed.  There were no reported hematuria or hematochezia.  She has minimum abdominal pain, resolved with pain medicine  Objective:  Vitals:   08/26/17 2147 08/27/17 0519  BP: 127/82 129/77  Pulse: (!) 102 90  Resp: 16 16  Temp: 98.6 F (37 C) (!) 97.4 F (36.3 C)  SpO2: 93% 94%     Intake/Output Summary (Last 24 hours) at 08/27/2017 0809 Last data filed at 08/27/2017 0724 Gross per 24 hour  Intake 370 ml  Output 1200 ml  Net -830 ml    GENERAL:alert, no distress and comfortable NEURO: alert & oriented x 3 with fluent speech, no focal motor/sensory deficits   Labs:  Lab Results  Component Value Date   WBC 19.4 (H) 08/27/2017   HGB 9.2 (L) 08/27/2017   HCT 28.9 (L) 08/27/2017   MCV 91.5 08/27/2017   PLT 330 08/27/2017   NEUTROABS 15.0 (H) 08/23/2017    Lab Results  Component Value Date   NA 138 08/27/2017   K 3.9 08/27/2017   CL 97 (L) 08/27/2017   CO2 28 08/27/2017

## 2017-08-27 NOTE — Progress Notes (Signed)
Subjective: No new complaints   Antibiotics:  Anti-infectives (From admission, onward)   Start     Dose/Rate Route Frequency Ordered Stop   08/26/17 1600  ceFAZolin (ANCEF) IVPB 2g/100 mL premix     2 g 200 mL/hr over 30 Minutes Intravenous  Once 08/25/17 1534 08/26/17 1648   08/25/17 1503  ceFAZolin (ANCEF) 2-4 GM/100ML-% IVPB    Note to Pharmacy:  Sherle Poe   : cabinet override      08/25/17 1503 08/26/17 0314   08/23/17 1400  anidulafungin (ERAXIS) 100 mg in sodium chloride 0.9 % 100 mL IVPB     100 mg 78 mL/hr over 100 Minutes Intravenous Every 24 hours 08/22/17 1231     08/22/17 1400  anidulafungin (ERAXIS) 200 mg in sodium chloride 0.9 % 200 mL IVPB     200 mg 78 mL/hr over 200 Minutes Intravenous  Once 08/22/17 1231 08/22/17 1703      Medications: Scheduled Meds: . dexamethasone  4 mg Oral BID WC  . divalproex  500 mg Oral QHS  . feeding supplement (ENSURE ENLIVE)  237 mL Oral BID BM  . magnesium oxide  400 mg Oral BID  . risperiDONE  1 mg Oral Daily   Continuous Infusions: . anidulafungin Stopped (08/27/17 1640)   PRN Meds:.acetaminophen **OR** acetaminophen, HYDROcodone-acetaminophen, ondansetron **OR** ondansetron (ZOFRAN) IV    Objective: Weight change:   Intake/Output Summary (Last 24 hours) at 08/27/2017 1822 Last data filed at 08/27/2017 1800 Gross per 24 hour  Intake 910 ml  Output 1300 ml  Net -390 ml   Blood pressure 118/81, pulse (!) 102, temperature 97.8 F (36.6 C), temperature source Oral, resp. rate 15, height 5\' 4"  (1.626 m), weight 159 lb 13.3 oz (72.5 kg), SpO2 94 %. Temp:  [97.4 F (36.3 C)-98.6 F (37 C)] 97.8 F (36.6 C) (05/08 1409) Pulse Rate:  [90-102] 102 (05/08 1409) Resp:  [15-16] 15 (05/08 1409) BP: (118-129)/(77-82) 118/81 (05/08 1409) SpO2:  [93 %-94 %] 94 % (05/08 1409)  Physical Exam: General: Alert and awake, oriented x3,  HEENT: anicteric sclera,  EOMI CVS regular rate, normal r,  no murmur rubs or  gallops Chest:  no wheezing, resp distress Abdomen, nondistended, soft Neuro: nonfocal  CBC:  CBC Latest Ref Rng & Units 08/27/2017 08/25/2017 08/24/2017  WBC 4.0 - 10.5 K/uL 19.4(H) 17.3(H) 17.4(H)  Hemoglobin 12.0 - 15.0 g/dL 9.2(L) 9.4(L) 9.3(L)  Hematocrit 36.0 - 46.0 % 28.9(L) 28.8(L) 29.4(L)  Platelets 150 - 400 K/uL 330 313 280      BMET Recent Labs    08/25/17 0448 08/27/17 0434  NA 138 138  K 3.6 3.9  CL 100* 97*  CO2 27 28  GLUCOSE 101* 106*  BUN 39* 31*  CREATININE 1.33* 1.20*  CALCIUM 8.3* 8.4*     Liver Panel  No results for input(s): PROT, ALBUMIN, AST, ALT, ALKPHOS, BILITOT, BILIDIR, IBILI in the last 72 hours.     Sedimentation Rate No results for input(s): ESRSEDRATE in the last 72 hours. C-Reactive Protein No results for input(s): CRP in the last 72 hours.  Micro Results: Recent Results (from the past 720 hour(s))  Urine Culture     Status: Abnormal   Collection Time: 08/11/17  1:24 PM  Result Value Ref Range Status   Specimen Description   Final    URINE, RANDOM Performed at [facility] [address] [place], [place] [address]    Special Requests  Final    NONE Performed at [facility], [place] [address] [place], [place] [address]    Culture MULTIPLE SPECIES PRESENT, SUGGEST RECOLLECTION (A)  Final   Report Status 08/13/2017 FINAL  Final  Blood Culture (routine x 2)     Status: Abnormal   Collection Time: 08/17/17  2:33 PM  Result Value Ref Range Status   Specimen Description   Final    BLOOD LEFT ANTECUBITAL Performed at [facility] [address], [place], [place] [address]    Special Requests   Final    BOTTLES DRAWN AEROBIC AND ANAEROBIC Blood Culture adequate volume Performed at [facility] [address] [place], [place] [address]    Culture  Setup Time   Final    YEAST AEROBIC BOTTLE ONLY CRITICAL RESULT CALLED TO, READ BACK BY AND VERIFIED  WITH: DR Lurline Del [id number] MLM Performed at [facility], [place] [address] [place], [place] [address]    Culture CANDIDA GLABRATA (A)  Final   Report Status 08/23/2017 FINAL  Final  Blood Culture ID Panel (Reflexed)     Status: Abnormal   Collection Time: 08/17/17  2:33 PM  Result Value Ref Range Status   Enterococcus species NOT DETECTED NOT DETECTED Final   Listeria monocytogenes NOT DETECTED NOT DETECTED Final   Staphylococcus species NOT DETECTED NOT DETECTED Final   Staphylococcus aureus NOT DETECTED NOT DETECTED Final   Streptococcus species NOT DETECTED NOT DETECTED Final   Streptococcus agalactiae NOT DETECTED NOT DETECTED Final   Streptococcus pneumoniae NOT DETECTED NOT DETECTED Final   Streptococcus pyogenes NOT DETECTED NOT DETECTED Final   Acinetobacter baumannii NOT DETECTED NOT DETECTED Final   Enterobacteriaceae species NOT DETECTED NOT DETECTED Final   Enterobacter cloacae complex NOT DETECTED NOT DETECTED Final   Escherichia coli NOT DETECTED NOT DETECTED Final   Klebsiella oxytoca NOT DETECTED NOT DETECTED Final   Klebsiella pneumoniae NOT DETECTED NOT DETECTED Final   Proteus species NOT DETECTED NOT DETECTED Final   Serratia marcescens NOT DETECTED NOT DETECTED Final   Haemophilus influenzae NOT DETECTED NOT DETECTED Final   Neisseria meningitidis NOT DETECTED NOT DETECTED Final   Pseudomonas aeruginosa NOT DETECTED NOT DETECTED Final   Candida albicans NOT DETECTED NOT DETECTED Final   Candida glabrata DETECTED (A) NOT DETECTED Final    Comment: CRITICAL RESULT CALLED TO, READ BACK BY AND VERIFIED WITH: DR Lurline Del [id number] MLM    Candida krusei NOT DETECTED NOT DETECTED Final   Candida parapsilosis NOT DETECTED NOT DETECTED Final   Candida tropicalis NOT DETECTED NOT DETECTED Final    Comment: Performed at [facility], [address], [place], [place] [address]  Blood Culture (routine x 2)     Status: None   Collection Time: 08/17/17  2:45  PM  Result Value Ref Range Status   Specimen Description   Final    BLOOD PORTA CATH Performed at [facility] [address], [place], [place] [address]    Special Requests   Final    BOTTLES DRAWN AEROBIC AND ANAEROBIC Blood Culture results may not be optimal due to an excessive volume of blood received in culture bottles Performed at [facility] [address] [place], [place] [address]    Culture   Final    NO GROWTH 5 DAYS Performed at [facility], [place] [address] [place], [place] [address]    Report Status  08/22/2017 FINAL  Final  Urine culture     Status: None   Collection Time: 08/17/17  5:00 PM  Result Value Ref Range Status   Specimen Description   Final    URINE, RANDOM Performed at [facility] [address], [place], [place] [address]    Special Requests   Final    NONE Performed at [facility], [place] [address] [place], [place] [address]    Culture   Final    NO GROWTH Performed at [facility], [place] [address], [place], [place] [address]    Report Status 08/19/2017 FINAL  Final  Blood culture (routine x 2)     Status: None   Collection Time: 08/22/17  1:00 PM  Result Value Ref Range Status   Specimen Description   Final    BLOOD LEFT ANTECUBITAL Performed at [facility] [address], [place], [place] [address]    Special Requests   Final    BOTTLES DRAWN AEROBIC AND ANAEROBIC Blood Culture adequate volume Performed at [facility] [address] [place], [place] [address]    Culture   Final    NO GROWTH 5 DAYS Performed at [facility], [place] [address] [place], [place] [address]    Report Status 08/27/2017 FINAL  Final  Blood culture (routine x 2)     Status: None   Collection Time: 08/22/17  1:13 PM  Result Value Ref Range Status   Specimen Description   Final    BLOOD RIGHT ANTECUBITAL Performed at [facility] [address] [place], [place] [address]    Special Requests   Final    BOTTLES DRAWN AEROBIC AND ANAEROBIC Blood Culture adequate volume Performed at [facility] [address] [place], [place] [address]    Culture   Final    NO GROWTH 5 DAYS Performed at [facility], [place] [address] [place], [place] [address]    Report Status 08/27/2017 FINAL  Final    Studies/Results: Ct Chest W Contrast  Result Date: 08/27/2017 CLINICAL DATA:  Endometrial adenocarcinoma. Elevated tumor markers. Progressive weight loss. Undergoing chemotherapy. EXAM: CT CHEST, ABDOMEN, AND PELVIS WITH CONTRAST TECHNIQUE: Multidetector CT imaging of the chest, abdomen and pelvis was performed following the standard protocol during bolus administration of intravenous contrast. CONTRAST:  116mL OMNIPAQUE IOHEXOL 300 MG/ML SOLN, 21mL OMNIPAQUE IOHEXOL 300 MG/ML SOLN COMPARISON:  06/20/2017 FINDINGS: CT CHEST FINDINGS Cardiovascular: No acute findings. Mediastinum/Lymph Nodes: No masses or pathologically enlarged lymph nodes identified. Lungs/Pleura: Increased size of moderate right and small left pleural effusions since previous study. Increased compressive atelectasis in both lower lobes. A few scattered sub-cm pulmonary nodules in both upper lobes remain stable. No new or enlarging pulmonary nodules or masses identified. Musculoskeletal:  No suspicious bone lesions identified. CT ABDOMEN AND PELVIS FINDINGS Hepatobiliary: New approximately 1 cm low-attenuation lesions are seen along the capsular surface the liver dome, suspicious for capsular tumor implants. Subcapsular cyst in right hepatic lobe is stable. Multiple calcified gallstones are seen, without evidence of cholecystitis or biliary ductal dilatation. Mild periportal edema is again seen. Pancreas:  No mass or inflammatory changes. Spleen:  Within normal limits in size and appearance. Adrenals/Urinary tract:  No masses  or hydronephrosis. Stomach/Bowel: No evidence of obstruction, inflammatory process, or abnormal fluid collections. Vascular/Lymphatic: No pathologically enlarged lymph nodes identified. No abdominal aortic aneurysm. 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 Reproductive: Heterogeneous soft tissue mass involving the uterine body and cervix shows no significant change in size since previous study. This measures 6.2 x 4.0 cm on image 105/2, compared to 6.1 x 4.3 cm previously. Deep myometrial invasion is seen, with probable adjacent extra uterine extension. Adnexal regions are unremarkable. Other: Mild ascites is increased since previous study. Soft tissue thickening and nodularity involving the omental fat appears stable, consistent with peritoneal carcinoma. Increased diffuse body wall edema also noted. Musculoskeletal:  No suspicious bone lesions identified. IMPRESSION: Increased size of two 1 cm low-attenuation lesions along the capsular surface of the liver dome, suspicious for peritoneal carcinoma. Other areas of soft tissue density in the omental fat are unchanged, consistent with peritoneal carcinomatosis. No significant change in soft tissue mass in the lower uterine segment and cervix, with probable adjacent extra-uterine extension. Increased bilateral pleural effusions, mild ascites, and diffuse body wall edema. Stable tiny indeterminate sub-cm bilateral upper lobe pulmonary nodules. Recommend continued follow-up by chest CT in 6 months. 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 Cholelithiasis.  No radiographic evidence of cholecystitis. Electronically Signed   By: Earle Gell M.D.   On: 08/27/2017 11:51   Ct Abdomen Pelvis W Contrast  Result Date: 08/27/2017 CLINICAL DATA:  Endometrial adenocarcinoma. Elevated tumor markers. Progressive weight loss. Undergoing chemotherapy. EXAM: CT CHEST, ABDOMEN, AND PELVIS WITH CONTRAST TECHNIQUE: Multidetector CT imaging of the  chest, abdomen and pelvis was performed following the standard protocol during bolus administration of intravenous contrast. CONTRAST:  174mL OMNIPAQUE IOHEXOL 300 MG/ML SOLN, 39mL OMNIPAQUE IOHEXOL 300 MG/ML SOLN COMPARISON:  06/20/2017 FINDINGS: CT CHEST FINDINGS Cardiovascular: No acute findings. Mediastinum/Lymph Nodes: No masses or pathologically enlarged lymph nodes identified. Lungs/Pleura: Increased size of moderate right and small left pleural effusions since previous study. Increased compressive atelectasis in both lower lobes. A few scattered sub-cm pulmonary nodules in both upper lobes remain stable. No new or enlarging pulmonary nodules or masses identified. Musculoskeletal:  No suspicious bone lesions identified. CT ABDOMEN AND PELVIS FINDINGS Hepatobiliary: New approximately 1 cm low-attenuation lesions are seen along the capsular surface the liver dome, suspicious for capsular tumor implants. Subcapsular cyst in right hepatic lobe is stable. Multiple calcified gallstones are seen, without evidence of cholecystitis or biliary ductal dilatation. Mild periportal edema is again seen. Pancreas:  No mass or inflammatory changes. Spleen:  Within normal limits in size and appearance. Adrenals/Urinary tract:  No masses or hydronephrosis. Stomach/Bowel: No evidence of obstruction, inflammatory process, or abnormal fluid collections. Vascular/Lymphatic: No pathologically enlarged lymph nodes identified. No abdominal aortic aneurysm. 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 Reproductive: Heterogeneous soft tissue mass involving the uterine body and cervix shows no significant change in size since previous study. This measures 6.2 x 4.0 cm on image 105/2, compared to 6.1 x 4.3 cm previously. Deep myometrial invasion is seen, with probable adjacent extra uterine extension. Adnexal regions are unremarkable. Other: Mild ascites is increased since previous study. Soft tissue thickening and  nodularity involving the omental fat appears stable, consistent with peritoneal carcinoma. Increased diffuse body wall edema also noted. Musculoskeletal:  No suspicious bone lesions identified. IMPRESSION: Increased size of two 1 cm low-attenuation lesions along the capsular surface of the liver dome, suspicious for peritoneal carcinoma. Other areas of soft tissue density in the omental fat are unchanged, consistent with peritoneal carcinomatosis. No significant change in soft tissue mass in the lower uterine segment and cervix, with probable adjacent extra-uterine extension. Increased bilateral  pleural effusions, mild ascites, and diffuse body wall edema. Stable tiny indeterminate sub-cm bilateral upper lobe pulmonary nodules. Recommend continued follow-up by chest CT in 6 months. 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 Cholelithiasis.  No radiographic evidence of cholecystitis. Electronically Signed   By: Earle Gell M.D.   On: 08/27/2017 11:51   Ir Removal [employer] W/ Compo W/o Fl Mod Sed  Result Date: 08/27/2017 INDICATION: 67 year old female with a history port catheter removal secondary to fungemia EXAM: REMOVAL RIGHT IJ VEIN PORT-A-CATH MEDICATIONS: 2.0 g Ancef; The antibiotic was administered within an appropriate time interval prior to skin puncture. ANESTHESIA/SEDATION: Moderate (conscious) sedation was employed during this procedure. A total of Versed 1.0 mg and Fentanyl 50 mcg was administered intravenously. Moderate Sedation Time: 10 minutes. The patient's level of consciousness and vital signs were monitored continuously by radiology nursing throughout the procedure under my direct supervision. FLUOROSCOPY TIME:  Fluoroscopy none COMPLICATIONS: None PROCEDURE: Informed consent was obtained from the patient following an explanation of the procedure, risks, benefits and alternatives. The patient understands, agrees and consents for the procedure. All questions were addressed. A  time out was performed prior to the initiation of the procedure. The patient was positioned in the operation suite in the supine position on a gantry. The previous scar on the right chest was generously infiltrated with 1% lidocaine for local anesthesia. Infiltration of the skin and subcutaneous tissues surrounding the port was performed. Using sharp and blunt dissection, the port apparatus and subcutaneous catheter were removed in their entirety. The port pocket was then closed with interrupted Vicryl layer and a running subcuticular with 4-0 Monocryl. The skin was sealed with Derma bond. A sterile dressing was placed. The patient tolerated the procedure well and remained hemodynamically stable throughout. No complications were encountered and no significant blood loss was encountered. IMPRESSION: Status post removal of right IJ port catheter. Signed, Dulcy Fanny. Earleen Newport, DO [facility] Radiology Electronically Signed   By: Corrie Mckusick D.O.   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 Her optho exam was clear She has had catheter removed  #1 Catheter associated C glabrata fungemia:  --repeat blood cultures pending --continue echinocandin therapy --DO NOT PLACE central line until we have proven clearance of her fungemia --she should get 2 weeks of echinocandin from date of blood cultures being negative POST catheter removal.   LOS: 5 days   Alcide Evener 08/27/2017, 6:22 PM

## 2017-08-27 NOTE — ED Provider Notes (Addendum)
[facility] Provider Note   CSN: [id number] Arrival date & time: 08/22/17  1221     History   Chief Complaint Chief Complaint  Patient presents with  . Abnormal Lab    HPI Emily Duarte is a 67 y.o. female.  HPI  67 year old female with history of endometrial cancer on chemotherapy, chronic steroid use comes in with chief complaint of abnormal labs. Patient was advised to come to the ER for fungemia. Patient states that she has been feeling weak, otherwise she denies any nausea, vomiting, fevers or chills.   Past Medical History:  Diagnosis Date  . Endometrial cancer ([place])   . Hemorrhoid   . Hypertension   . Paranoid schizophrenia ([place])   . Snake bite     Patient Active Problem List   Diagnosis Date Noted  . Immunocompromised due to corticosteroids   . Protein-calorie malnutrition, severe 08/25/2017  . Pressure injury of skin 08/25/2017  . Endometrial cancer ([place])   . Fungemia 08/22/2017  . CKD (chronic kidney disease) stage 3, GFR 30-59 ml/min (HCC) 08/21/2017  . Chronic bilateral pleural effusions 08/21/2017  . Leukocytosis 08/21/2017  . SIRS (systemic inflammatory response syndrome) ([place]) 08/18/2017  . Schizophrenia ([place])   . Dehydration 08/17/2017  . Malnutrition ([place]) 08/13/2017  . UTI (urinary tract infection) 08/11/2017  . Hypomagnesemia 06/23/2017  . Acute prerenal failure ([place]) 06/23/2017  . Left femoral vein DVT ([place]) 06/20/2017  . Lung nodule < 6cm on CT 06/06/2017  . Hypokalemia 06/06/2017  . Nausea with vomiting 06/06/2017  . Delusions ([place])   . Goals of care, counseling/discussion 03/12/2017  . Cancer associated pain 03/12/2017  . Schizoaffective disorder, depressive type ([place]) 02/05/2017  . Peripheral neuropathy due to chemotherapy ([place]) 12/09/2016  . Elevated liver enzymes 09/06/2016  . Anemia, chronic disease 09/06/2016  . Endometrial/uterine adenocarcinoma ([place]) 08/19/2016  . Bipolar 1  disorder, mixed, severe ([place]) 08/07/2012  . Cyst of soft tissue 08/07/2012    Past Surgical History:  Procedure Laterality Date  . ENDOMETRIAL BIOPSY  08/11/2016  . IR FLUORO GUIDE PORT INSERTION RIGHT  09/05/2016  . IR REMOVAL TUN ACCESS W/ PORT W/O FL MOD SED  08/26/2017  . IR US GUIDE VASC ACCESS RIGHT  09/05/2016  . TUBAL LIGATION       OB History    Gravida  1   Para  1   Term      Preterm      AB      Living  1     SAB      TAB      Ectopic      Multiple      Live Births  1            Home Medications    Prior to Admission medications   Medication Sig Start Date End Date Taking? Authorizing Provider  dexamethasone (DECADRON) 4 MG tablet Take 1 tablet (4 mg total) by mouth 2 (two) times daily with a meal. 07/11/17  Yes Gorsuch, Ni, MD  divalproex (DEPAKOTE ER) 500 MG 24 hr tablet Take 500 mg by mouth at bedtime.   Yes [provider]  HYDROcodone-acetaminophen (NORCO) 10-325 MG tablet Take 1 tablet by mouth every 4 (four) hours as needed. Patient taking differently: Take 1 tablet by mouth every 4 (four) hours as needed for moderate pain.  08/20/17  Yes Gerlene Fee, NP  magnesium oxide (MAG-OX) 400 (241.3 Mg) MG tablet Take 1 tablet (  400 mg total) by mouth 2 (two) times daily. 06/23/17  Yes Gorsuch, Ni, MD  ondansetron (ZOFRAN) 8 MG tablet Take 1 tablet (8 mg total) by mouth every 8 (eight) hours as needed for nausea. 04/10/17  Yes Gorsuch, Ni, MD  potassium chloride SA (K-DUR,KLOR-CON) 10 MEQ tablet Take 1 tablet (10 mEq total) by mouth daily. 08/14/17  Yes Lavina Hamman, MD  prochlorperazine (COMPAZINE) 10 MG tablet Take 1 tablet (10 mg total) by mouth every 6 (six) hours as needed for nausea or vomiting. 04/10/17  Yes Gorsuch, Ni, MD  risperiDONE (RISPERDAL) 1 MG tablet Take 1 mg by mouth daily. 05/13/17  Yes [provider]  rivaroxaban (XARELTO) 20 MG TABS tablet Take 1 tablet (20 mg total) by mouth daily with supper. 08/14/17  Yes Lavina Hamman, MD  TUBERCULIN PPD ID Inject 0.1 mLs into the skin once.   Yes [provider]    Family History Family History  Problem Relation Age of Onset  . Colon cancer Mother        late 71s  . Schizophrenia Sister   . Colon cancer Sister        late 12 s    Social History Social History   Tobacco Use  . Smoking status: Never Smoker  . Smokeless tobacco: Never Used  Substance Use Topics  . Alcohol use: No  . Drug use: No     Allergies   Patient has no known allergies.   Review of Systems Review of Systems  Constitutional: Positive for fatigue.  Respiratory: Negative for chest tightness and shortness of breath.   Cardiovascular: Negative for chest pain.  Gastrointestinal: Negative for diarrhea and vomiting.  Neurological: Positive for weakness.  All other systems reviewed and are negative.    Physical Exam Updated Vital Signs BP 118/81 (BP Location: Left Arm)   Pulse (!) 102   Temp 97.8 F (36.6 C) (Oral)   Resp 15   Ht 5\' 4"  (1.626 m)   Wt 72.5 kg (159 lb 13.3 oz)   SpO2 94%   BMI 27.44 kg/m   Physical Exam  Constitutional: She is oriented to person, place, and time. She appears well-developed.  HENT:  Head: Normocephalic and atraumatic.  Eyes: EOM are normal.  Neck: Normal range of motion. Neck supple.  Cardiovascular: Normal rate.  Pulmonary/Chest: Effort normal.  Abdominal: Bowel sounds are normal.  Musculoskeletal: She exhibits no edema or tenderness.  Neurological: She is alert and oriented to person, place, and time.  Skin: Skin is warm and dry. Rash noted. There is erythema.  Nursing note and vitals reviewed.    ED Treatments / Results  Labs (all labs ordered are listed, but only abnormal results are displayed) Labs Reviewed  BASIC METABOLIC PANEL - Abnormal; Notable for the following components:      Result Value   Glucose, Bld 111 (*)    BUN 30 (*)    Creatinine, Ser 1.07 (*)    Calcium 8.2 (*)    GFR calc non Af Amer  52 (*)    All other components within normal limits  CBC WITH DIFFERENTIAL/PLATELET - Abnormal; Notable for the following components:   WBC 18.4 (*)    RBC 3.53 (*)    Hemoglobin 10.2 (*)    HCT 31.4 (*)    RDW 17.7 (*)    Neutro Abs 16.1 (*)    All other components within normal limits  VALPROIC ACID LEVEL - Abnormal; Notable for the following  components:   Valproic Acid Lvl 14 (*)    All other components within normal limits  COMPREHENSIVE METABOLIC PANEL - Abnormal; Notable for the following components:   Chloride 99 (*)    BUN 30 (*)    Creatinine, Ser 1.12 (*)    Calcium 8.5 (*)    Total Protein 5.3 (*)    Albumin 1.6 (*)    Total Bilirubin <0.1 (*)    GFR calc non Af Amer 49 (*)    GFR calc Af Amer 57 (*)    All other components within normal limits  CBC WITH DIFFERENTIAL/PLATELET - Abnormal; Notable for the following components:   WBC 17.9 (*)    RBC 3.34 (*)    Hemoglobin 9.5 (*)    HCT 30.1 (*)    RDW 17.8 (*)    Neutro Abs 15.0 (*)    Monocytes Absolute 1.6 (*)    All other components within normal limits  CBC - Abnormal; Notable for the following components:   WBC 17.4 (*)    RBC 3.25 (*)    Hemoglobin 9.3 (*)    HCT 29.4 (*)    RDW 17.6 (*)    All other components within normal limits  BASIC METABOLIC PANEL - Abnormal; Notable for the following components:   Chloride 100 (*)    Glucose, Bld 114 (*)    BUN 35 (*)    Creatinine, Ser 1.18 (*)    Calcium 8.2 (*)    GFR calc non Af Amer 46 (*)    GFR calc Af Amer 54 (*)    All other components within normal limits  CBC - Abnormal; Notable for the following components:   WBC 17.3 (*)    RBC 3.21 (*)    Hemoglobin 9.4 (*)    HCT 28.8 (*)    RDW 17.5 (*)    All other components within normal limits  BASIC METABOLIC PANEL - Abnormal; Notable for the following components:   Chloride 100 (*)    Glucose, Bld 101 (*)    BUN 39 (*)    Creatinine, Ser 1.33 (*)    Calcium 8.3 (*)    GFR calc non Af Amer 40  (*)    GFR calc Af Amer 46 (*)    All other components within normal limits  CBC - Abnormal; Notable for the following components:   WBC 19.4 (*)    RBC 3.16 (*)    Hemoglobin 9.2 (*)    HCT 28.9 (*)    RDW 17.6 (*)    All other components within normal limits  BASIC METABOLIC PANEL - Abnormal; Notable for the following components:   Chloride 97 (*)    Glucose, Bld 106 (*)    BUN 31 (*)    Creatinine, Ser 1.20 (*)    Calcium 8.4 (*)    GFR calc non Af Amer 45 (*)    GFR calc Af Amer 53 (*)    All other components within normal limits  CULTURE, BLOOD (ROUTINE X 2)  CULTURE, BLOOD (ROUTINE X 2)  HIV ANTIBODY (ROUTINE TESTING)  HEPATITIS C ANTIBODY (REFLEX)  HCV COMMENT:  CA 125  CBC  BASIC METABOLIC PANEL    EKG None  Radiology Ct Chest W Contrast  Result Date: 08/27/2017 CLINICAL DATA:  Endometrial adenocarcinoma. Elevated tumor markers. Progressive weight loss. Undergoing chemotherapy. EXAM: CT CHEST, ABDOMEN, AND PELVIS WITH CONTRAST TECHNIQUE: Multidetector CT imaging of the chest, abdomen and pelvis was performed following the standard  protocol during bolus administration of intravenous contrast. CONTRAST:  185mL OMNIPAQUE IOHEXOL 300 MG/ML SOLN, 57mL OMNIPAQUE IOHEXOL 300 MG/ML SOLN COMPARISON:  06/20/2017 FINDINGS: CT CHEST FINDINGS Cardiovascular: No acute findings. Mediastinum/Lymph Nodes: No masses or pathologically enlarged lymph nodes identified. Lungs/Pleura: Increased size of moderate right and small left pleural effusions since previous study. Increased compressive atelectasis in both lower lobes. A few scattered sub-cm pulmonary nodules in both upper lobes remain stable. No new or enlarging pulmonary nodules or masses identified. Musculoskeletal:  No suspicious bone lesions identified. CT ABDOMEN AND PELVIS FINDINGS Hepatobiliary: New approximately 1 cm low-attenuation lesions are seen along the capsular surface the liver dome, suspicious for capsular tumor implants.  Subcapsular cyst in right hepatic lobe is stable. Multiple calcified gallstones are seen, without evidence of cholecystitis or biliary ductal dilatation. Mild periportal edema is again seen. Pancreas:  No mass or inflammatory changes. Spleen:  Within normal limits in size and appearance. Adrenals/Urinary tract:  No masses or hydronephrosis. Stomach/Bowel: No evidence of obstruction, inflammatory process, or abnormal fluid collections. Vascular/Lymphatic: No pathologically enlarged lymph nodes identified. No abdominal aortic aneurysm. 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 Reproductive: Heterogeneous soft tissue mass involving the uterine body and cervix shows no significant change in size since previous study. This measures 6.2 x 4.0 cm on image 105/2, compared to 6.1 x 4.3 cm previously. Deep myometrial invasion is seen, with probable adjacent extra uterine extension. Adnexal regions are unremarkable. Other: Mild ascites is increased since previous study. Soft tissue thickening and nodularity involving the omental fat appears stable, consistent with peritoneal carcinoma. Increased diffuse body wall edema also noted. Musculoskeletal:  No suspicious bone lesions identified. IMPRESSION: Increased size of two 1 cm low-attenuation lesions along the capsular surface of the liver dome, suspicious for peritoneal carcinoma. Other areas of soft tissue density in the omental fat are unchanged, consistent with peritoneal carcinomatosis. No significant change in soft tissue mass in the lower uterine segment and cervix, with probable adjacent extra-uterine extension. Increased bilateral pleural effusions, mild ascites, and diffuse body wall edema. Stable tiny indeterminate sub-cm bilateral upper lobe pulmonary nodules. Recommend continued follow-up by chest CT in 6 months. 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 Cholelithiasis.  No radiographic evidence of cholecystitis. Electronically  Signed   By: Earle Gell M.D.   On: 08/27/2017 11:51   Ct Abdomen Pelvis W Contrast  Result Date: 08/27/2017 CLINICAL DATA:  Endometrial adenocarcinoma. Elevated tumor markers. Progressive weight loss. Undergoing chemotherapy. EXAM: CT CHEST, ABDOMEN, AND PELVIS WITH CONTRAST TECHNIQUE: Multidetector CT imaging of the chest, abdomen and pelvis was performed following the standard protocol during bolus administration of intravenous contrast. CONTRAST:  15mL OMNIPAQUE IOHEXOL 300 MG/ML SOLN, 58mL OMNIPAQUE IOHEXOL 300 MG/ML SOLN COMPARISON:  06/20/2017 FINDINGS: CT CHEST FINDINGS Cardiovascular: No acute findings. Mediastinum/Lymph Nodes: No masses or pathologically enlarged lymph nodes identified. Lungs/Pleura: Increased size of moderate right and small left pleural effusions since previous study. Increased compressive atelectasis in both lower lobes. A few scattered sub-cm pulmonary nodules in both upper lobes remain stable. No new or enlarging pulmonary nodules or masses identified. Musculoskeletal:  No suspicious bone lesions identified. CT ABDOMEN AND PELVIS FINDINGS Hepatobiliary: New approximately 1 cm low-attenuation lesions are seen along the capsular surface the liver dome, suspicious for capsular tumor implants. Subcapsular cyst in right hepatic lobe is stable. Multiple calcified gallstones are seen, without evidence of cholecystitis or biliary ductal dilatation. Mild periportal edema is again seen. Pancreas:  No mass  or inflammatory changes. Spleen:  Within normal limits in size and appearance. Adrenals/Urinary tract:  No masses or hydronephrosis. Stomach/Bowel: No evidence of obstruction, inflammatory process, or abnormal fluid collections. Vascular/Lymphatic: No pathologically enlarged lymph nodes identified. No abdominal aortic aneurysm. 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 Reproductive: Heterogeneous soft tissue mass involving the uterine body and cervix shows no significant  change in size since previous study. This measures 6.2 x 4.0 cm on image 105/2, compared to 6.1 x 4.3 cm previously. Deep myometrial invasion is seen, with probable adjacent extra uterine extension. Adnexal regions are unremarkable. Other: Mild ascites is increased since previous study. Soft tissue thickening and nodularity involving the omental fat appears stable, consistent with peritoneal carcinoma. Increased diffuse body wall edema also noted. Musculoskeletal:  No suspicious bone lesions identified. IMPRESSION: Increased size of two 1 cm low-attenuation lesions along the capsular surface of the liver dome, suspicious for peritoneal carcinoma. Other areas of soft tissue density in the omental fat are unchanged, consistent with peritoneal carcinomatosis. No significant change in soft tissue mass in the lower uterine segment and cervix, with probable adjacent extra-uterine extension. Increased bilateral pleural effusions, mild ascites, and diffuse body wall edema. Stable tiny indeterminate sub-cm bilateral upper lobe pulmonary nodules. Recommend continued follow-up by chest CT in 6 months. 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 Cholelithiasis.  No radiographic evidence of cholecystitis. Electronically Signed   By: Earle Gell M.D.   On: 08/27/2017 11:51   Ir Removal [employer] W/ Gore W/o Fl Mod Sed  Result Date: 08/27/2017 INDICATION: 67 year old female with a history port catheter removal secondary to fungemia EXAM: REMOVAL RIGHT IJ VEIN PORT-A-CATH MEDICATIONS: 2.0 g Ancef; The antibiotic was administered within an appropriate time interval prior to skin puncture. ANESTHESIA/SEDATION: Moderate (conscious) sedation was employed during this procedure. A total of Versed 1.0 mg and Fentanyl 50 mcg was administered intravenously. Moderate Sedation Time: 10 minutes. The patient's level of consciousness and vital signs were monitored continuously by radiology nursing throughout the procedure  under my direct supervision. FLUOROSCOPY TIME:  Fluoroscopy none COMPLICATIONS: None PROCEDURE: Informed consent was obtained from the patient following an explanation of the procedure, risks, benefits and alternatives. The patient understands, agrees and consents for the procedure. All questions were addressed. A time out was performed prior to the initiation of the procedure. The patient was positioned in the operation suite in the supine position on a gantry. The previous scar on the right chest was generously infiltrated with 1% lidocaine for local anesthesia. Infiltration of the skin and subcutaneous tissues surrounding the port was performed. Using sharp and blunt dissection, the port apparatus and subcutaneous catheter were removed in their entirety. The port pocket was then closed with interrupted Vicryl layer and a running subcuticular with 4-0 Monocryl. The skin was sealed with Derma bond. A sterile dressing was placed. The patient tolerated the procedure well and remained hemodynamically stable throughout. No complications were encountered and no significant blood loss was encountered. IMPRESSION: Status post removal of right IJ port catheter. Signed, Dulcy Fanny. Earleen Newport, DO [facility] Electronically Signed   By: Corrie Mckusick D.O.   On: 08/27/2017 09:08    Procedures Procedures (including critical care time)  CRITICAL CARE Performed by: Takya Vandivier   Total critical care time: 32 minutes - fungemia  Critical care time was exclusive of separately billable procedures and treating other patients.  Critical care was necessary to treat or prevent  imminent or life-threatening deterioration.  Critical care was time spent personally by me on the following activities: development of treatment plan with patient and/or surrogate as well as nursing, discussions with consultants, evaluation of patient's response to treatment, examination of  patient, obtaining history from patient or surrogate, ordering and performing treatments and interventions, ordering and review of laboratory studies, ordering and review of radiographic studies, pulse oximetry and re-evaluation of patient's condition.   Medications Ordered in ED Medications  anidulafungin (ERAXIS) 100 mg in sodium chloride 0.9 % 100 mL IVPB (100 mg Intravenous New Bag/Given 08/27/17 1500)  dexamethasone (DECADRON) tablet 4 mg (4 mg Oral Given 08/27/17 1610)  divalproex (DEPAKOTE ER) 24 hr tablet 500 mg (500 mg Oral Given 08/26/17 2243)  feeding supplement (ENSURE ENLIVE) (ENSURE ENLIVE) liquid 237 mL (237 mLs Oral Not Given 08/27/17 1505)  HYDROcodone-acetaminophen (NORCO) 10-325 MG per tablet 1 tablet (1 tablet Oral Given 08/27/17 0739)  magnesium oxide (MAG-OX) tablet 400 mg (400 mg Oral Given 08/27/17 1032)  risperiDONE (RISPERDAL) tablet 1 mg (1 mg Oral Given 08/27/17 1032)  acetaminophen (TYLENOL) tablet 650 mg (has no administration in time range)    Or  acetaminophen (TYLENOL) suppository 650 mg (has no administration in time range)  ondansetron (ZOFRAN) tablet 4 mg (has no administration in time range)    Or  ondansetron (ZOFRAN) injection 4 mg (has no administration in time range)  ceFAZolin (ANCEF) 2-4 GM/100ML-% IVPB (has no administration in time range)  lidocaine (XYLOCAINE) 1 % (with pres) injection (has no administration in time range)  midazolam (VERSED) 2 MG/2ML injection (has no administration in time range)  fentaNYL (SUBLIMAZE) 100 MCG/2ML injection (has no administration in time range)  anidulafungin (ERAXIS) 200 mg in sodium chloride 0.9 % 200 mL IVPB (0 mg Intravenous Stopped 08/22/17 1703)  traZODone (DESYREL) tablet 50 mg (50 mg Oral Given 08/23/17 2121)  ceFAZolin (ANCEF) IVPB 2g/100 mL premix (0 g Intravenous Stopped 08/26/17 1648)  midazolam (VERSED) injection (1 mg Intravenous Given 08/26/17 1551)  fentaNYL (SUBLIMAZE) injection (50 mcg Intravenous Given 08/26/17  1555)  lidocaine (PF) (XYLOCAINE) 1 % injection (30 mLs  Given 08/26/17 1551)  iohexol (OMNIPAQUE) 300 MG/ML solution 30 mL (30 mLs Oral Contrast Given 08/26/17 1730)  iohexol (OMNIPAQUE) 300 MG/ML solution 100 mL (100 mLs Intravenous Contrast Given 08/26/17 1933)     Initial Impression / Assessment and Plan / ED Course  I have reviewed the triage vital signs and the nursing notes.  Pertinent labs & imaging results that were available during my care of the patient were reviewed by me and considered in my medical decision making (see chart for details).     67 year old female with history of endometrial cancer comes in with chief complaint of abnormal blood cultures.  Patient was recently admitted to the hospital for adrenal insufficiency.  Blood cultures drawn at that time revealed fungemia, patient was advised to come to the ER.  Her only complaint is worsening weakness otherwise constitutional's are negative.  Patient is immunocompromised and she is also taking steroids because of her adrenal insufficiency.  She has a PICC line in place which could be the cause of the fungemia.  We will start antifungal in the ED.  ID team is already aware of the patient and patient will be admitted to medicine.  Final Clinical Impressions(s) / ED Diagnoses   Final diagnoses:  Fungemia  Immunocompromised due to corticosteroids  History of chemotherapy    ED Discharge Orders    None  Varney Biles, MD 08/27/17 [phone]

## 2017-08-28 ENCOUNTER — Telehealth: Payer: Self-pay | Admitting: Hematology and Oncology

## 2017-08-28 DIAGNOSIS — Z9221 Personal history of antineoplastic chemotherapy: Secondary | ICD-10-CM

## 2017-08-28 DIAGNOSIS — B49 Unspecified mycosis: Secondary | ICD-10-CM

## 2017-08-28 LAB — CBC
HCT: 28.9 % — ABNORMAL LOW (ref 36.0–46.0)
HEMOGLOBIN: 9.1 g/dL — AB (ref 12.0–15.0)
MCH: 28.7 pg (ref 26.0–34.0)
MCHC: 31.5 g/dL (ref 30.0–36.0)
MCV: 91.2 fL (ref 78.0–100.0)
PLATELETS: 328 10*3/uL (ref 150–400)
RBC: 3.17 MIL/uL — ABNORMAL LOW (ref 3.87–5.11)
RDW: 17.9 % — ABNORMAL HIGH (ref 11.5–15.5)
WBC: 19.9 10*3/uL — ABNORMAL HIGH (ref 4.0–10.5)

## 2017-08-28 LAB — BASIC METABOLIC PANEL
Anion gap: 11 (ref 5–15)
BUN: 32 mg/dL — AB (ref 6–20)
CHLORIDE: 99 mmol/L — AB (ref 101–111)
CO2: 26 mmol/L (ref 22–32)
CREATININE: 1.28 mg/dL — AB (ref 0.44–1.00)
Calcium: 8.4 mg/dL — ABNORMAL LOW (ref 8.9–10.3)
GFR calc Af Amer: 49 mL/min — ABNORMAL LOW (ref >60)
GFR calc non Af Amer: 42 mL/min — ABNORMAL LOW (ref >60)
GLUCOSE: 108 mg/dL — AB (ref 65–99)
Potassium: 4.3 mmol/L (ref 3.5–5.1)
Sodium: 136 mmol/L (ref 135–145)

## 2017-08-28 LAB — HEMOGLOBIN AND HEMATOCRIT, BLOOD
HCT: 29.3 % — ABNORMAL LOW (ref 36.0–46.0)
HEMOGLOBIN: 9.3 g/dL — AB (ref 12.0–15.0)

## 2017-08-28 LAB — CA 125: Cancer Antigen (CA) 125: 687 U/mL — ABNORMAL HIGH (ref 0.0–38.1)

## 2017-08-28 NOTE — Progress Notes (Addendum)
MD notified of patient's frank red blood with large clots in urine.  VS stable; patient asymptomatic of anemia. MD will re-check H&H later today.  No new orders at this time.  RN to continue to monitor and notify MD of any changes.

## 2017-08-28 NOTE — Telephone Encounter (Signed)
Tried to call regarding 5/23

## 2017-08-28 NOTE — Progress Notes (Signed)
PROGRESS NOTE    Emily Duarte  ZJQ:[id number] DOB: Sep 04, 1950 DOA: 08/22/2017 PCP: Nolene Ebbs, MD     Brief Narrative:  HPI On 08/22/2017 by Dr. Riccardo Dubin [name] a 67 y.o.femalewith medical history significant ofendometrial cancer, hypertension, schizophrenia, left common femoral vein deep vein thrombosis. She had a recent hospitalization April 28 to May 1st,2019 due to hypotension related to adrenal insufficiency;she was discharged in a stable condition on dexamethasone hormone supplemental therapy.Since her discharge 48 hours ago, she has been experiencing significant weakness,moderate to severe in intensity,worse with exertion, no improving factors, associated with poor oral intake, no fevers, no chills, no diarrhea, no nauseaorvomiting. Her blood cultures from April 28 tested positive for Candida Christ Kick was called to come back to the hospital for further therapy. She does have a right internal jugular vein Port-A-Cath(May 2018),that she used for chemotherapy. She received carboplatin and Taxol for 6 cycles.Apparently she tolerated treatment very poorly with progressive weight loss and renal failure. Currently on daily Doxil no further cisplatin.  Interim history: Infectious disease was consulted and patient started on Eraxis. Port-A-Cath was removed by IR on 5/7. She was evaluated by oncology and pending CT chest/abd/pelvis to evaluate for cancer progression. Discussed with Dr. Alvy Bimler; CT indicates stable disease and she will arrange for follow up in 2 weeks as outpatient.   Assessment & Plan:   Principal Problem:   Fungemia Active Problems:   Malnutrition (Emily Duarte)   Endometrial cancer (Emily Duarte)   Protein-calorie malnutrition, severe   Pressure injury of skin   Immunocompromised due to corticosteroids   Sepsis secondary to fungemia secondary to Candida glabrata -Related to right internal jugular vein Port-A-Cath infection, removed on  5/7  -Blood culture done on 08/17/2017 was positive for Candida -Repeat blood cultures 08/22/2017 showed no growth to date -Infectious disease consulted and appreciated-recommended port removal, with blood cultures obtained after removal of port and no central lines until clearance of fungemia.  Hold chemotherapy for at least 2 weeks from first date of negative blood cultures -Ophthalmology consulted and appreciated, found no evidence of intraocular involvement.  Mild ocular hypertension recommended outpatient assessment -Continue Eraxis  -Repeat blood cultures ordered today. Per ID, plan for 2 weeks treatment from date of negative blood culture post catheter removal.   Adrenal insufficiency -Patient was recently hospitalized for adrenal insufficiency and discharged on Aug 20, 2017 -Currently blood pressure stable, continue decadron   Left common femoral DVT -Diagnosed March 2019 -Xarelto held due to active bleeding   Endometrial carcinoma cancer associated pain and severe protein calorie malnutrition -Follows with Dr. Alvy Bimler, last chemo 08/01/17  -Follow up outpatient in 2 weeks   Depression/schizophrenia -Continue depakote, risperdal   Normocytic anemia, chronic -Continue to monitor CBC. Hgb stable today   Chronic kidney disease, stage III -Continue to monitor BMP. Cr stable today   Severe malnutrition -Nutrition consulted and appreciated, continue nutritional supplements  Stage 2 pressure injury of buttock, POA -Supportive care, off loading and turns    DVT prophylaxis: subq hep Code Status: Full Family Communication: No family at bedside Disposition Plan: Pending repeat blood culture. Will return to [facility] on discharge.    Consultants:   Infectious disease  IR  Ophthalmology  Oncology  Procedures:   Port-A-Cath removal 5/7   Antimicrobials:  Anti-infectives (From admission, onward)   Start     Dose/Rate Route Frequency Ordered Stop    08/26/17 1600  ceFAZolin (ANCEF) IVPB 2g/100 mL premix     2 g 200  mL/hr over 30 Minutes Intravenous  Once 08/25/17 1534 08/26/17 1648   08/25/17 1503  ceFAZolin (ANCEF) 2-4 GM/100ML-% IVPB    Note to Pharmacy:  Sherle Poe   : cabinet override      08/25/17 1503 08/26/17 0314   08/23/17 1400  anidulafungin (ERAXIS) 100 mg in sodium chloride 0.9 % 100 mL IVPB     100 mg 78 mL/hr over 100 Minutes Intravenous Every 24 hours 08/22/17 1231     08/22/17 1400  anidulafungin (ERAXIS) 200 mg in sodium chloride 0.9 % 200 mL IVPB     200 mg 78 mL/hr over 200 Minutes Intravenous  Once 08/22/17 1231 08/22/17 1703       Subjective: Patient had some vaginal bleeding this morning, but less than yesterday. No other complaints.   Objective: Vitals:   08/27/17 0519 08/27/17 1409 08/27/17 2033 08/28/17 0549  BP: 129/77 118/81 129/84 138/82  Pulse: 90 (!) 102 98 85  Resp: 16 15 16 18   Temp: (!) 97.4 F (36.3 C) 97.8 F (36.6 C) 98.1 F (36.7 C) 97.9 F (36.6 C)  TempSrc: Oral Oral Oral Oral  SpO2: 94% 94% 96% 96%  Weight:      Height:        Intake/Output Summary (Last 24 hours) at 08/28/2017 0852 Last data filed at 08/28/2017 0615 Gross per 24 hour  Intake 1050 ml  Output 700 ml  Net 350 ml   Filed Weights   08/22/17 1233 08/24/17 0513  Weight: 76.2 kg (168 lb) 72.5 kg (159 lb 13.3 oz)    Examination: General exam: Appears calm and comfortable  Respiratory system: Clear to auscultation. Respiratory effort normal. Cardiovascular system: S1 & S2 heard, RRR. No JVD, murmurs, rubs, gallops or clicks. No pedal edema. Gastrointestinal system: Abdomen is nondistended, soft and nontender. No organomegaly or masses felt. Normal bowel sounds heard. Central nervous system: Alert and oriented. No focal neurological deficits. Extremities: Symmetric 5 x 5 power. Skin: No rashes, lesions or ulcers Psychiatry: Flat affect   Data Reviewed: I have personally reviewed following labs and imaging  studies  CBC: Recent Labs  Lab 08/22/17 1242 08/23/17 0504 08/24/17 0420 08/25/17 0448 08/27/17 0434 08/28/17 0446  WBC 18.4* 17.9* 17.4* 17.3* 19.4* 19.9*  NEUTROABS 16.1* 15.0*  --   --   --   --   HGB 10.2* 9.5* 9.3* 9.4* 9.2* 9.1*  HCT 31.4* 30.1* 29.4* 28.8* 28.9* 28.9*  MCV 89.0 90.1 90.5 89.7 91.5 91.2  PLT 269 278 280 313 330 175   Basic Metabolic Panel: Recent Labs  Lab 08/23/17 0504 08/24/17 0420 08/25/17 0448 08/27/17 0434 08/28/17 0446  NA 135 137 138 138 136  K 4.5 3.9 3.6 3.9 4.3  CL 99* 100* 100* 97* 99*  CO2 26 27 27 28 26   GLUCOSE 93 114* 101* 106* 108*  BUN 30* 35* 39* 31* 32*  CREATININE 1.12* 1.18* 1.33* 1.20* 1.28*  CALCIUM 8.5* 8.2* 8.3* 8.4* 8.4*   GFR: Estimated Creatinine Clearance: 41 mL/min (A) (by C-G formula based on SCr of 1.28 mg/dL (H)). Liver Function Tests: Recent Labs  Lab 08/23/17 0504  AST 26  ALT 20  ALKPHOS 103  BILITOT <0.1*  PROT 5.3*  ALBUMIN 1.6*   No results for input(s): LIPASE, AMYLASE in the last 168 hours. No results for input(s): AMMONIA in the last 168 hours. Coagulation Profile: No results for input(s): INR, PROTIME in the last 168 hours. Cardiac Enzymes: No results for input(s): CKTOTAL, CKMB, CKMBINDEX, TROPONINI  in the last 168 hours. BNP (last 3 results) No results for input(s): PROBNP in the last 8760 hours. HbA1C: No results for input(s): HGBA1C in the last 72 hours. CBG: No results for input(s): GLUCAP in the last 168 hours. Lipid Profile: No results for input(s): CHOL, HDL, LDLCALC, TRIG, CHOLHDL, LDLDIRECT in the last 72 hours. Thyroid Function Tests: No results for input(s): TSH, T4TOTAL, FREET4, T3FREE, THYROIDAB in the last 72 hours. Anemia Panel: No results for input(s): VITAMINB12, FOLATE, FERRITIN, TIBC, IRON, RETICCTPCT in the last 72 hours. Sepsis Labs: No results for input(s): PROCALCITON, LATICACIDVEN in the last 168 hours.  Recent Results (from the past 240 hour(s))  Blood  culture (routine x 2)     Status: None   Collection Time: 08/22/17  1:00 PM  Result Value Ref Range Status   Specimen Description   Final    BLOOD LEFT ANTECUBITAL Performed at [facility] [address] [place], [place] [address]    Special Requests   Final    BOTTLES DRAWN AEROBIC AND ANAEROBIC Blood Culture adequate volume Performed at [facility] [address], [place], [place] [address]    Culture   Final    NO GROWTH 5 DAYS Performed at [facility], [place] [address] [place], [place] [address]    Report Status 08/27/2017 FINAL  Final  Blood culture (routine x 2)     Status: None   Collection Time: 08/22/17  1:13 PM  Result Value Ref Range Status   Specimen Description   Final    BLOOD RIGHT ANTECUBITAL Performed at [facility] [address] [place], [place] [address]    Special Requests   Final    BOTTLES DRAWN AEROBIC AND ANAEROBIC Blood Culture adequate volume Performed at [facility] [address] [place], [place] [address]    Culture   Final    NO GROWTH 5 DAYS Performed at [facility], [place] [address], [employer], [place] [address]    Report Status 08/27/2017 FINAL  Final       Radiology Studies: Ct Chest W Contrast  Result Date: 08/27/2017 CLINICAL DATA:  Endometrial adenocarcinoma. Elevated tumor markers. Progressive weight loss. Undergoing chemotherapy. EXAM: CT CHEST, ABDOMEN, AND PELVIS WITH CONTRAST TECHNIQUE: Multidetector CT imaging of the chest, abdomen and pelvis was performed following the standard protocol during bolus administration of intravenous contrast. CONTRAST:  155mL OMNIPAQUE IOHEXOL 300 MG/ML SOLN, 88mL OMNIPAQUE IOHEXOL 300 MG/ML SOLN COMPARISON:  06/20/2017 FINDINGS: CT CHEST FINDINGS Cardiovascular: No acute findings. Mediastinum/Lymph Nodes: No masses or pathologically enlarged lymph nodes identified. Lungs/Pleura: Increased size of moderate  right and small left pleural effusions since previous study. Increased compressive atelectasis in both lower lobes. A few scattered sub-cm pulmonary nodules in both upper lobes remain stable. No new or enlarging pulmonary nodules or masses identified. Musculoskeletal:  No suspicious bone lesions identified. CT ABDOMEN AND PELVIS FINDINGS Hepatobiliary: New approximately 1 cm low-attenuation lesions are seen along the capsular surface the liver dome, suspicious for capsular tumor implants. Subcapsular cyst in right hepatic lobe is stable. Multiple calcified gallstones are seen, without evidence of cholecystitis or biliary ductal dilatation. Mild periportal edema is again seen. Pancreas:  No mass or inflammatory changes. Spleen:  Within normal limits in size and appearance. Adrenals/Urinary tract:  No masses or hydronephrosis. Stomach/Bowel: No evidence of obstruction, inflammatory process, or abnormal fluid collections. Vascular/Lymphatic: No pathologically enlarged lymph nodes identified. No abdominal aortic aneurysm. Chronic DVT  in the left common femoral vein remains stable. Reproductive: Heterogeneous soft tissue mass involving the uterine body and cervix shows no significant change in size since previous study. This measures 6.2 x 4.0 cm on image 105/2, compared to 6.1 x 4.3 cm previously. Deep myometrial invasion is seen, with probable adjacent extra uterine extension. Adnexal regions are unremarkable. Other: Mild ascites is increased since previous study. Soft tissue thickening and nodularity involving the omental fat appears stable, consistent with peritoneal carcinoma. Increased diffuse body wall edema also noted. Musculoskeletal:  No suspicious bone lesions identified. IMPRESSION: Increased size of two 1 cm low-attenuation lesions along the capsular surface of the liver dome, suspicious for peritoneal carcinoma. Other areas of soft tissue density in the omental fat are unchanged, consistent with peritoneal  carcinomatosis. No significant change in soft tissue mass in the lower uterine segment and cervix, with probable adjacent extra-uterine extension. Increased bilateral pleural effusions, mild ascites, and diffuse body wall edema. Stable tiny indeterminate sub-cm bilateral upper lobe pulmonary nodules. Recommend continued follow-up by chest CT in 6 months. 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 Cholelithiasis.  No radiographic evidence of cholecystitis. Electronically Signed   By: Earle Gell M.D.   On: 08/27/2017 11:51   Ct Abdomen Pelvis W Contrast  Result Date: 08/27/2017 CLINICAL DATA:  Endometrial adenocarcinoma. Elevated tumor markers. Progressive weight loss. Undergoing chemotherapy. EXAM: CT CHEST, ABDOMEN, AND PELVIS WITH CONTRAST TECHNIQUE: Multidetector CT imaging of the chest, abdomen and pelvis was performed following the standard protocol during bolus administration of intravenous contrast. CONTRAST:  184mL OMNIPAQUE IOHEXOL 300 MG/ML SOLN, 38mL OMNIPAQUE IOHEXOL 300 MG/ML SOLN COMPARISON:  06/20/2017 FINDINGS: CT CHEST FINDINGS Cardiovascular: No acute findings. Mediastinum/Lymph Nodes: No masses or pathologically enlarged lymph nodes identified. Lungs/Pleura: Increased size of moderate right and small left pleural effusions since previous study. Increased compressive atelectasis in both lower lobes. A few scattered sub-cm pulmonary nodules in both upper lobes remain stable. No new or enlarging pulmonary nodules or masses identified. Musculoskeletal:  No suspicious bone lesions identified. CT ABDOMEN AND PELVIS FINDINGS Hepatobiliary: New approximately 1 cm low-attenuation lesions are seen along the capsular surface the liver dome, suspicious for capsular tumor implants. Subcapsular cyst in right hepatic lobe is stable. Multiple calcified gallstones are seen, without evidence of cholecystitis or biliary ductal dilatation. Mild periportal edema is again seen. Pancreas:  No mass  or inflammatory changes. Spleen:  Within normal limits in size and appearance. Adrenals/Urinary tract:  No masses or hydronephrosis. Stomach/Bowel: No evidence of obstruction, inflammatory process, or abnormal fluid collections. Vascular/Lymphatic: No pathologically enlarged lymph nodes identified. No abdominal aortic aneurysm. 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 Reproductive: Heterogeneous soft tissue mass involving the uterine body and cervix shows no significant change in size since previous study. This measures 6.2 x 4.0 cm on image 105/2, compared to 6.1 x 4.3 cm previously. Deep myometrial invasion is seen, with probable adjacent extra uterine extension. Adnexal regions are unremarkable. Other: Mild ascites is increased since previous study. Soft tissue thickening and nodularity involving the omental fat appears stable, consistent with peritoneal carcinoma. Increased diffuse body wall edema also noted. Musculoskeletal:  No suspicious bone lesions identified. IMPRESSION: Increased size of two 1 cm low-attenuation lesions along the capsular surface of the liver dome, suspicious for peritoneal carcinoma. Other areas of soft tissue density in the omental fat are unchanged, consistent with peritoneal carcinomatosis. No significant change in soft tissue mass in the lower uterine segment and cervix, with  probable adjacent extra-uterine extension. Increased bilateral pleural effusions, mild ascites, and diffuse body wall edema. Stable tiny indeterminate sub-cm bilateral upper lobe pulmonary nodules. Recommend continued follow-up by chest CT in 6 months. 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 Cholelithiasis.  No radiographic evidence of cholecystitis. Electronically Signed   By: Earle Gell M.D.   On: 08/27/2017 11:51   Ir Removal [employer] W/ [place] W/o Fl Mod Sed  Result Date: 08/27/2017 INDICATION: 67 year old female with a history port catheter removal secondary to  fungemia EXAM: REMOVAL RIGHT IJ VEIN PORT-A-CATH MEDICATIONS: 2.0 g Ancef; The antibiotic was administered within an appropriate time interval prior to skin puncture. ANESTHESIA/SEDATION: Moderate (conscious) sedation was employed during this procedure. A total of Versed 1.0 mg and Fentanyl 50 mcg was administered intravenously. Moderate Sedation Time: 10 minutes. The patient's level of consciousness and vital signs were monitored continuously by radiology nursing throughout the procedure under my direct supervision. FLUOROSCOPY TIME:  Fluoroscopy none COMPLICATIONS: None PROCEDURE: Informed consent was obtained from the patient following an explanation of the procedure, risks, benefits and alternatives. The patient understands, agrees and consents for the procedure. All questions were addressed. A time out was performed prior to the initiation of the procedure. The patient was positioned in the operation suite in the supine position on a gantry. The previous scar on the right chest was generously infiltrated with 1% lidocaine for local anesthesia. Infiltration of the skin and subcutaneous tissues surrounding the port was performed. Using sharp and blunt dissection, the port apparatus and subcutaneous catheter were removed in their entirety. The port pocket was then closed with interrupted Vicryl layer and a running subcuticular with 4-0 Monocryl. The skin was sealed with Derma bond. A sterile dressing was placed. The patient tolerated the procedure well and remained hemodynamically stable throughout. No complications were encountered and no significant blood loss was encountered. IMPRESSION: Status post removal of right IJ port catheter. Signed, Dulcy Fanny. Earleen Newport, DO [facility] Electronically Signed   By: Corrie Mckusick D.O.   On: 08/27/2017 09:08      Scheduled Meds: . dexamethasone  4 mg Oral BID WC  . divalproex  500 mg Oral QHS  . feeding  supplement (ENSURE ENLIVE)  237 mL Oral BID BM  . magnesium oxide  400 mg Oral BID  . risperiDONE  1 mg Oral Daily   Continuous Infusions: . anidulafungin Stopped (08/27/17 1640)     LOS: 6 days    Time spent: 25 minutes   Dessa Phi, DO [facility] [email or web address] Password [email or web address] 08/28/2017, 8:52 AM

## 2017-08-28 NOTE — Progress Notes (Signed)
Subjective: No new complaints   Antibiotics:  Anti-infectives (From admission, onward)   Start     Dose/Rate Route Frequency Ordered Stop   08/26/17 1600  ceFAZolin (ANCEF) IVPB 2g/100 mL premix     2 g 200 mL/hr over 30 Minutes Intravenous  Once 08/25/17 1534 08/26/17 1648   08/25/17 1503  ceFAZolin (ANCEF) 2-4 GM/100ML-% IVPB    Note to Pharmacy:  Sherle Poe   : cabinet override      08/25/17 1503 08/26/17 0314   08/23/17 1400  anidulafungin (ERAXIS) 100 mg in sodium chloride 0.9 % 100 mL IVPB     100 mg 78 mL/hr over 100 Minutes Intravenous Every 24 hours 08/22/17 1231     08/22/17 1400  anidulafungin (ERAXIS) 200 mg in sodium chloride 0.9 % 200 mL IVPB     200 mg 78 mL/hr over 200 Minutes Intravenous  Once 08/22/17 1231 08/22/17 1703      Medications: Scheduled Meds: . dexamethasone  4 mg Oral BID WC  . divalproex  500 mg Oral QHS  . feeding supplement (ENSURE ENLIVE)  237 mL Oral BID BM  . magnesium oxide  400 mg Oral BID  . risperiDONE  1 mg Oral Daily   Continuous Infusions: . anidulafungin Stopped (08/28/17 1759)   PRN Meds:.acetaminophen **OR** acetaminophen, HYDROcodone-acetaminophen, ondansetron **OR** ondansetron (ZOFRAN) IV    Objective: Weight change:   Intake/Output Summary (Last 24 hours) at 08/28/2017 1800 Last data filed at 08/28/2017 1400 Gross per 24 hour  Intake 560 ml  Output 1400 ml  Net -840 ml   Blood pressure 114/83, pulse (!) 113, temperature 98 F (36.7 C), temperature source Oral, resp. rate 16, height 5\' 4"  (1.626 m), weight 159 lb 13.3 oz (72.5 kg), SpO2 95 %. Temp:  [97.9 F (36.6 C)-98.1 F (36.7 C)] 98 F (36.7 C) (05/09 1254) Pulse Rate:  [85-113] 113 (05/09 1254) Resp:  [16-18] 16 (05/09 1254) BP: (114-138)/(82-84) 114/83 (05/09 1254) SpO2:  [95 %-96 %] 95 % (05/09 1254)  Physical Exam: General: Alert and awake, oriented x3,  HEENT: anicteric sclera,  EOMI CVS regular rate, normal r,  no murmur rubs or  gallops Chest:  no wheezing, resp distress Abdomen, nondistended, soft Neuro: nonfocal  CBC:  CBC Latest Ref Rng & Units 08/28/2017 08/28/2017 08/27/2017  WBC 4.0 - 10.5 K/uL - 19.9(H) 19.4(H)  Hemoglobin 12.0 - 15.0 g/dL 9.3(L) 9.1(L) 9.2(L)  Hematocrit 36.0 - 46.0 % 29.3(L) 28.9(L) 28.9(L)  Platelets 150 - 400 K/uL - 328 330      BMET Recent Labs    08/27/17 0434 08/28/17 0446  NA 138 136  K 3.9 4.3  CL 97* 99*  CO2 28 26  GLUCOSE 106* 108*  BUN 31* 32*  CREATININE 1.20* 1.28*  CALCIUM 8.4* 8.4*     Liver Panel  No results for input(s): PROT, ALBUMIN, AST, ALT, ALKPHOS, BILITOT, BILIDIR, IBILI in the last 72 hours.     Sedimentation Rate No results for input(s): ESRSEDRATE in the last 72 hours. C-Reactive Protein No results for input(s): CRP in the last 72 hours.  Micro Results: Recent Results (from the past 720 hour(s))  Urine Culture     Status: Abnormal   Collection Time: 08/11/17  1:24 PM  Result Value Ref Range Status   Specimen Description   Final    URINE, RANDOM Performed at [facility] [address], [place], [place] [address]    Special Requests  Final    NONE Performed at [facility], [place] [address] [place], [place] [address]    Culture MULTIPLE SPECIES PRESENT, SUGGEST RECOLLECTION (A)  Final   Report Status 08/13/2017 FINAL  Final  Blood Culture (routine x 2)     Status: Abnormal   Collection Time: 08/17/17  2:33 PM  Result Value Ref Range Status   Specimen Description   Final    BLOOD LEFT ANTECUBITAL Performed at [facility] [address], [place], [place] [address]    Special Requests   Final    BOTTLES DRAWN AEROBIC AND ANAEROBIC Blood Culture adequate volume Performed at [facility] [address] [place], [place] [address]    Culture  Setup Time   Final    YEAST AEROBIC BOTTLE ONLY CRITICAL RESULT CALLED TO, READ BACK BY AND VERIFIED WITH: DR  Lurline Del [id number] MLM Performed at [facility], [place] [address], [place], [place] [address]    Culture CANDIDA GLABRATA (A)  Final   Report Status 08/23/2017 FINAL  Final  Blood Culture ID Panel (Reflexed)     Status: Abnormal   Collection Time: 08/17/17  2:33 PM  Result Value Ref Range Status   Enterococcus species NOT DETECTED NOT DETECTED Final   Listeria monocytogenes NOT DETECTED NOT DETECTED Final   Staphylococcus species NOT DETECTED NOT DETECTED Final   Staphylococcus aureus NOT DETECTED NOT DETECTED Final   Streptococcus species NOT DETECTED NOT DETECTED Final   Streptococcus agalactiae NOT DETECTED NOT DETECTED Final   Streptococcus pneumoniae NOT DETECTED NOT DETECTED Final   Streptococcus pyogenes NOT DETECTED NOT DETECTED Final   Acinetobacter baumannii NOT DETECTED NOT DETECTED Final   Enterobacteriaceae species NOT DETECTED NOT DETECTED Final   Enterobacter cloacae complex NOT DETECTED NOT DETECTED Final   Escherichia coli NOT DETECTED NOT DETECTED Final   Klebsiella oxytoca NOT DETECTED NOT DETECTED Final   Klebsiella pneumoniae NOT DETECTED NOT DETECTED Final   Proteus species NOT DETECTED NOT DETECTED Final   Serratia marcescens NOT DETECTED NOT DETECTED Final   Haemophilus influenzae NOT DETECTED NOT DETECTED Final   Neisseria meningitidis NOT DETECTED NOT DETECTED Final   Pseudomonas aeruginosa NOT DETECTED NOT DETECTED Final   Candida albicans NOT DETECTED NOT DETECTED Final   Candida glabrata DETECTED (A) NOT DETECTED Final    Comment: CRITICAL RESULT CALLED TO, READ BACK BY AND VERIFIED WITH: DR Lurline Del [id number] MLM    Candida krusei NOT DETECTED NOT DETECTED Final   Candida parapsilosis NOT DETECTED NOT DETECTED Final   Candida tropicalis NOT DETECTED NOT DETECTED Final    Comment: Performed at [facility], [address] [place], [place] [address]  Blood Culture (routine x 2)     Status: None   Collection Time: 08/17/17  2:45 PM    Result Value Ref Range Status   Specimen Description   Final    BLOOD PORTA CATH Performed at [facility] [address], [place], [place] [address]    Special Requests   Final    BOTTLES DRAWN AEROBIC AND ANAEROBIC Blood Culture results may not be optimal due to an excessive volume of blood received in culture bottles Performed at [facility] [address], [place], [place] [address]    Culture   Final    NO GROWTH 5 DAYS Performed at [facility], [place] [address] [place], [place] [address]    Report  Status 08/22/2017 FINAL  Final  Urine culture     Status: None   Collection Time: 08/17/17  5:00 PM  Result Value Ref Range Status   Specimen Description   Final    URINE, RANDOM Performed at [facility] [address], [place], [place] [address]    Special Requests   Final    NONE Performed at [facility], [place] [address], [place], [place] [address]    Culture   Final    NO GROWTH Performed at [facility], [place] [address], [place], [place] [address]    Report Status 08/19/2017 FINAL  Final  Blood culture (routine x 2)     Status: None   Collection Time: 08/22/17  1:00 PM  Result Value Ref Range Status   Specimen Description   Final    BLOOD LEFT ANTECUBITAL Performed at [facility] [address] [place], [place] [address]    Special Requests   Final    BOTTLES DRAWN AEROBIC AND ANAEROBIC Blood Culture adequate volume Performed at [facility] [address] [place], [place] [address]    Culture   Final    NO GROWTH 5 DAYS Performed at [facility], [place] [address] [place], [place] [address]    Report Status 08/27/2017 FINAL  Final  Blood culture (routine x 2)     Status: None   Collection Time: 08/22/17  1:13 PM  Result Value Ref Range Status   Specimen Description   Final    BLOOD RIGHT ANTECUBITAL Performed at [facility] [address], [place], [place] [address]    Special Requests   Final    BOTTLES DRAWN AEROBIC AND ANAEROBIC Blood Culture adequate volume Performed at [facility] [address], [place], [place] [address]    Culture   Final    NO GROWTH 5 DAYS Performed at [facility], [place] [address] [place], [place] [address]    Report Status 08/27/2017 FINAL  Final    Studies/Results: Ct Chest W Contrast  Result Date: 08/27/2017 CLINICAL DATA:  Endometrial adenocarcinoma. Elevated tumor markers. Progressive weight loss. Undergoing chemotherapy. EXAM: CT CHEST, ABDOMEN, AND PELVIS WITH CONTRAST TECHNIQUE: Multidetector CT imaging of the chest, abdomen and pelvis was performed following the standard protocol during bolus administration of intravenous contrast. CONTRAST:  147mL OMNIPAQUE IOHEXOL 300 MG/ML SOLN, 12mL OMNIPAQUE IOHEXOL 300 MG/ML SOLN COMPARISON:  06/20/2017 FINDINGS: CT CHEST FINDINGS Cardiovascular: No acute findings. Mediastinum/Lymph Nodes: No masses or pathologically enlarged lymph nodes identified. Lungs/Pleura: Increased size of moderate right and small left pleural effusions since previous study. Increased compressive atelectasis in both lower lobes. A few scattered sub-cm pulmonary nodules in both upper lobes remain stable. No new or enlarging pulmonary nodules or masses identified. Musculoskeletal:  No suspicious bone lesions identified. CT ABDOMEN AND PELVIS FINDINGS Hepatobiliary: New approximately 1 cm low-attenuation lesions are seen along the capsular surface the liver dome, suspicious for capsular tumor implants. Subcapsular cyst in right hepatic lobe is stable. Multiple calcified gallstones are seen, without evidence of cholecystitis or biliary ductal dilatation. Mild periportal edema is again seen. Pancreas:  No mass or inflammatory changes. Spleen:  Within normal limits in size and appearance. Adrenals/Urinary tract:  No masses or  hydronephrosis. Stomach/Bowel: No evidence of obstruction, inflammatory process, or abnormal fluid collections. Vascular/Lymphatic: No pathologically enlarged lymph nodes identified. No abdominal aortic aneurysm. 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 Reproductive: Heterogeneous soft tissue mass involving the uterine body and cervix shows no significant change in size since previous study. This measures 6.2 x 4.0 cm on image 105/2, compared to 6.1 x 4.3 cm previously. Deep myometrial invasion is seen, with probable adjacent extra uterine extension. Adnexal regions are unremarkable. Other: Mild ascites is increased since previous study. Soft tissue thickening and nodularity involving the omental fat appears stable, consistent with peritoneal carcinoma. Increased diffuse body wall edema also noted. Musculoskeletal:  No suspicious bone lesions identified. IMPRESSION: Increased size of two 1 cm low-attenuation lesions along the capsular surface of the liver dome, suspicious for peritoneal carcinoma. Other areas of soft tissue density in the omental fat are unchanged, consistent with peritoneal carcinomatosis. No significant change in soft tissue mass in the lower uterine segment and cervix, with probable adjacent extra-uterine extension. Increased bilateral pleural effusions, mild ascites, and diffuse body wall edema. Stable tiny indeterminate sub-cm bilateral upper lobe pulmonary nodules. Recommend continued follow-up by chest CT in 6 months. 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 Cholelithiasis.  No radiographic evidence of cholecystitis. Electronically Signed   By: Earle Gell M.D.   On: 08/27/2017 11:51   Ct Abdomen Pelvis W Contrast  Result Date: 08/27/2017 CLINICAL DATA:  Endometrial adenocarcinoma. Elevated tumor markers. Progressive weight loss. Undergoing chemotherapy. EXAM: CT CHEST, ABDOMEN, AND PELVIS WITH CONTRAST TECHNIQUE: Multidetector CT imaging of the  chest, abdomen and pelvis was performed following the standard protocol during bolus administration of intravenous contrast. CONTRAST:  110mL OMNIPAQUE IOHEXOL 300 MG/ML SOLN, 45mL OMNIPAQUE IOHEXOL 300 MG/ML SOLN COMPARISON:  06/20/2017 FINDINGS: CT CHEST FINDINGS Cardiovascular: No acute findings. Mediastinum/Lymph Nodes: No masses or pathologically enlarged lymph nodes identified. Lungs/Pleura: Increased size of moderate right and small left pleural effusions since previous study. Increased compressive atelectasis in both lower lobes. A few scattered sub-cm pulmonary nodules in both upper lobes remain stable. No new or enlarging pulmonary nodules or masses identified. Musculoskeletal:  No suspicious bone lesions identified. CT ABDOMEN AND PELVIS FINDINGS Hepatobiliary: New approximately 1 cm low-attenuation lesions are seen along the capsular surface the liver dome, suspicious for capsular tumor implants. Subcapsular cyst in right hepatic lobe is stable. Multiple calcified gallstones are seen, without evidence of cholecystitis or biliary ductal dilatation. Mild periportal edema is again seen. Pancreas:  No mass or inflammatory changes. Spleen:  Within normal limits in size and appearance. Adrenals/Urinary tract:  No masses or hydronephrosis. Stomach/Bowel: No evidence of obstruction, inflammatory process, or abnormal fluid collections. Vascular/Lymphatic: No pathologically enlarged lymph nodes identified. No abdominal aortic aneurysm. 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 Reproductive: Heterogeneous soft tissue mass involving the uterine body and cervix shows no significant change in size since previous study. This measures 6.2 x 4.0 cm on image 105/2, compared to 6.1 x 4.3 cm previously. Deep myometrial invasion is seen, with probable adjacent extra uterine extension. Adnexal regions are unremarkable. Other: Mild ascites is increased since previous study. Soft tissue thickening and  nodularity involving the omental fat appears stable, consistent with peritoneal carcinoma. Increased diffuse body wall edema also noted. Musculoskeletal:  No suspicious bone lesions identified. IMPRESSION: Increased size of two 1 cm low-attenuation lesions along the capsular surface of the liver dome, suspicious for peritoneal carcinoma. Other areas of soft tissue density in the omental fat are unchanged, consistent with peritoneal carcinomatosis. No significant change in soft tissue mass in the lower uterine segment and cervix, with probable adjacent extra-uterine extension. Increased  bilateral pleural effusions, mild ascites, and diffuse body wall edema. Stable tiny indeterminate sub-cm bilateral upper lobe pulmonary nodules. Recommend continued follow-up by chest CT in 6 months. 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 Cholelithiasis.  No radiographic evidence of cholecystitis. Electronically Signed   By: Earle Gell M.D.   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 Her optho exam was clear She has had catheter removed  #1 Catheter associated C glabrata fungemia:  --repeat blood cultures pending from TODAY ( I should have ordered yesterday) --continue echinocandin therapy --DO NOT PLACE central line until we have proven clearance of her fungemia --she should get 2 weeks of echinocandin from date of blood cultures being negative POST catheter removal.     LOS: 6 days   Rhina Brackett [place] 08/28/2017, 6:00 PM

## 2017-08-28 NOTE — Progress Notes (Signed)
Physical Therapy Treatment Patient Details Name: Emily Duarte MRN: [id number] DOB: Aug 14, 1950 Today's Date: 08/28/2017    History of Present Illness Pt admitted with fungemia.  PMH" endometrial cancer, hypertension, schizophrenia, left common femoral vein deep vein thrombosis    PT Comments    Assisted OOB to amb to bathroom.  Pt had bright red clotty urine and a small BM.  NT notified.  Assisted with amb a limited distance in hallway.  Weak and unsteady.   Follow Up Recommendations  SNF     Equipment Recommendations  None recommended by PT    Recommendations for Other Services       Precautions / Restrictions Precautions Precautions: Fall Restrictions Weight Bearing Restrictions: No    Mobility  Bed Mobility Overal bed mobility: Needs Assistance Bed Mobility: Supine to Sit     Supine to sit: Min assist     General bed mobility comments: increased time and assist B LE  Transfers Overall transfer level: Needs assistance Equipment used: Rolling walker (2 wheeled) Transfers: Sit to/from [facility] Sit to Stand: Mod assist Stand pivot transfers: Mod assist       General transfer comment: multiple attempts then from elevated bed required increased assist and lean on walker for support.    Also assisted in bathroom  Ambulation/Gait Ambulation/Gait assistance: Min assist;Mod assist Ambulation Distance (Feet): 20 Feet Assistive device: Rolling walker (2 wheeled) Gait Pattern/deviations: Step-to pattern;Decreased stance time - left Gait velocity: decreased   General Gait Details: decreased strength L LE with knee buckle.  Limited distance due to weakness and fatigue.     Stairs             Wheelchair Mobility    Modified Rankin (Stroke Patients Only)       Balance                                            Cognition Arousal/Alertness: Awake/alert Behavior During Therapy: WFL for tasks  assessed/performed Overall Cognitive Status: Within Functional Limits for tasks assessed                                        Exercises      General Comments        Pertinent Vitals/Pain Pain Assessment: No/denies pain    Home Living                      Prior Function            PT Goals (current goals can now be found in the care plan section) Progress towards PT goals: Progressing toward goals    Frequency    Min 2X/week      PT Plan Current plan remains appropriate    Co-evaluation              AM-PAC PT "6 Clicks" Daily Activity  Outcome Measure  Difficulty turning over in bed (including adjusting bedclothes, sheets and blankets)?: A Lot Difficulty moving from lying on back to sitting on the side of the bed? : A Lot Difficulty sitting down on and standing up from a chair with arms (e.g., wheelchair, bedside commode, etc,.)?: A Lot Help needed moving to and from a bed to chair (including a wheelchair)?: A Lot Help  needed walking in hospital room?: A Lot Help needed climbing 3-5 steps with a railing? : A Lot 6 Click Score: 12    End of Session Equipment Utilized During Treatment: Gait belt Activity Tolerance: Patient limited by fatigue Patient left: in chair;with call bell/phone within reach Nurse Communication: Mobility status PT Visit Diagnosis: Muscle weakness (generalized) (M62.81);Difficulty in walking, not elsewhere classified (R26.2)     Time: 8871-9597 PT Time Calculation (min) (ACUTE ONLY): 13 min  Charges:  $Gait Training: 8-22 mins                    G Codes:       Rica Koyanagi  PTA [facility]  Acute  Rehab Pager      [phone]

## 2017-08-28 NOTE — Progress Notes (Signed)
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  Continue supportive care for now.  It is not clear to me whether she will recover well in 2 weeks to resume treatment but her son would like her to continue on aggressive supportive care  Anemia, chronic disease She has received recent blood transfusion She is not symptomatic today  Acute prerenal failure ([place]), resolved She has acute on chronic renal failure likely secondary to poor oral fluid intake and dehydration  Fungal infection The port is removed. Will not resume treatment for at least 2 weeks per ID recommendation  Protein-calorie malnutrition, severe ([place]) The cause of protein calorie malnutrition is due to poor oral intake and progression of cancer She has been seen by dietitian  Left femoral vein DVT ([place]) I discussed extensively with her son She is on anticoagulation therapy and the risk of bleeding has to be balanced with the benefits of anticoagulation treatment For now, Xarelto is placed on hold due to ongoing active vaginal bleeding  Cancer associated pain She has moderate cancer pain, currently well controlled Her pain is stable  Goals of care discussion The patient's mental health illness precluded meaningful discussion about goals of care Her son is her dedicated healthcare power of attorney I updated her son today.  Discharge planning The patient is close to being discharged to skilled nursing facility She can be discharged from my standpoint.  I will schedule outpatient  follow-up in 2 weeks  Heath Lark, MD 08/28/2017  8:23 AM   Subjective:  She feels well.  She has been afebrile.  She continues to have vaginal bleeding but not severe.  Pain is well controlled.  Objective:  Vitals:   08/27/17 2033 08/28/17 0549  BP: 129/84 138/82  Pulse: 98 85  Resp: 16 18  Temp: 98.1 F (36.7 C) 97.9 F (36.6 C)  SpO2: 96% 96%     Intake/Output Summary (Last 24 hours) at 08/28/2017 6237 Last data filed at 08/28/2017 0615 Gross per 24 hour  Intake 1050 ml  Output 700 ml  Net 350 ml    GENERAL:alert, no distress and comfortable SKIN: skin color, texture, turgor are normal, no rashes or significant lesions EYES: normal, Conjunctiva are pink and non-injected, sclera clear OROPHARYNX:no exudate, no erythema and lips, buccal mucosa, and tongue normal  NECK: supple, thyroid normal size, non-tender, without nodularity LYMPH:  no palpable lymphadenopathy in the cervical, axillary or inguinal LUNGS: clear to auscultation and percussion with normal breathing effort HEART: regular rate & rhythm and no murmurs and no lower extremity edema ABDOMEN:abdomen soft, non-tender and normal bowel sounds Musculoskeletal:no cyanosis of digits and no clubbing  NEURO: alert & oriented x 3 with fluent speech, no focal motor/sensory deficits   Labs:  Lab Results  Component Value Date   WBC 19.9 (H) 08/28/2017   HGB 9.1 (L) 08/28/2017   HCT 28.9 (L) 08/28/2017   MCV 91.2 08/28/2017   PLT 328 08/28/2017   NEUTROABS 15.0 (H) 08/23/2017    Lab Results  Component Value Date   NA 136 08/28/2017   K 4.3 08/28/2017   CL 99 (L) 08/28/2017  CO2 26 08/28/2017    Studies:  Ct Chest W Contrast  Result Date: 08/27/2017 CLINICAL DATA:  Endometrial adenocarcinoma. Elevated tumor markers. Progressive weight loss. Undergoing chemotherapy. EXAM: CT CHEST, ABDOMEN, AND PELVIS WITH CONTRAST TECHNIQUE: Multidetector CT imaging of the chest, abdomen and pelvis was performed following the  standard protocol during bolus administration of intravenous contrast. CONTRAST:  178mL OMNIPAQUE IOHEXOL 300 MG/ML SOLN, 57mL OMNIPAQUE IOHEXOL 300 MG/ML SOLN COMPARISON:  06/20/2017 FINDINGS: CT CHEST FINDINGS Cardiovascular: No acute findings. Mediastinum/Lymph Nodes: No masses or pathologically enlarged lymph nodes identified. Lungs/Pleura: Increased size of moderate right and small left pleural effusions since previous study. Increased compressive atelectasis in both lower lobes. A few scattered sub-cm pulmonary nodules in both upper lobes remain stable. No new or enlarging pulmonary nodules or masses identified. Musculoskeletal:  No suspicious bone lesions identified. CT ABDOMEN AND PELVIS FINDINGS Hepatobiliary: New approximately 1 cm low-attenuation lesions are seen along the capsular surface the liver dome, suspicious for capsular tumor implants. Subcapsular cyst in right hepatic lobe is stable. Multiple calcified gallstones are seen, without evidence of cholecystitis or biliary ductal dilatation. Mild periportal edema is again seen. Pancreas:  No mass or inflammatory changes. Spleen:  Within normal limits in size and appearance. Adrenals/Urinary tract:  No masses or hydronephrosis. Stomach/Bowel: No evidence of obstruction, inflammatory process, or abnormal fluid collections. Vascular/Lymphatic: No pathologically enlarged lymph nodes identified. No abdominal aortic aneurysm. 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 Reproductive: Heterogeneous soft tissue mass involving the uterine body and cervix shows no significant change in size since previous study. This measures 6.2 x 4.0 cm on image 105/2, compared to 6.1 x 4.3 cm previously. Deep myometrial invasion is seen, with probable adjacent extra uterine extension. Adnexal regions are unremarkable. Other: Mild ascites is increased since previous study. Soft tissue thickening and nodularity involving the omental fat appears stable,  consistent with peritoneal carcinoma. Increased diffuse body wall edema also noted. Musculoskeletal:  No suspicious bone lesions identified. IMPRESSION: Increased size of two 1 cm low-attenuation lesions along the capsular surface of the liver dome, suspicious for peritoneal carcinoma. Other areas of soft tissue density in the omental fat are unchanged, consistent with peritoneal carcinomatosis. No significant change in soft tissue mass in the lower uterine segment and cervix, with probable adjacent extra-uterine extension. Increased bilateral pleural effusions, mild ascites, and diffuse body wall edema. Stable tiny indeterminate sub-cm bilateral upper lobe pulmonary nodules. Recommend continued follow-up by chest CT in 6 months. 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 Cholelithiasis.  No radiographic evidence of cholecystitis. Electronically Signed   By: Earle Gell M.D.   On: 08/27/2017 11:51   Ct Abdomen Pelvis W Contrast  Result Date: 08/27/2017 CLINICAL DATA:  Endometrial adenocarcinoma. Elevated tumor markers. Progressive weight loss. Undergoing chemotherapy. EXAM: CT CHEST, ABDOMEN, AND PELVIS WITH CONTRAST TECHNIQUE: Multidetector CT imaging of the chest, abdomen and pelvis was performed following the standard protocol during bolus administration of intravenous contrast. CONTRAST:  175mL OMNIPAQUE IOHEXOL 300 MG/ML SOLN, 66mL OMNIPAQUE IOHEXOL 300 MG/ML SOLN COMPARISON:  06/20/2017 FINDINGS: CT CHEST FINDINGS Cardiovascular: No acute findings. Mediastinum/Lymph Nodes: No masses or pathologically enlarged lymph nodes identified. Lungs/Pleura: Increased size of moderate right and small left pleural effusions since previous study. Increased compressive atelectasis in both lower lobes. A few scattered sub-cm pulmonary nodules in both upper lobes remain stable. No new or enlarging pulmonary nodules or masses identified. Musculoskeletal:  No suspicious bone lesions identified. CT ABDOMEN  AND PELVIS  FINDINGS Hepatobiliary: New approximately 1 cm low-attenuation lesions are seen along the capsular surface the liver dome, suspicious for capsular tumor implants. Subcapsular cyst in right hepatic lobe is stable. Multiple calcified gallstones are seen, without evidence of cholecystitis or biliary ductal dilatation. Mild periportal edema is again seen. Pancreas:  No mass or inflammatory changes. Spleen:  Within normal limits in size and appearance. Adrenals/Urinary tract:  No masses or hydronephrosis. Stomach/Bowel: No evidence of obstruction, inflammatory process, or abnormal fluid collections. Vascular/Lymphatic: No pathologically enlarged lymph nodes identified. No abdominal aortic aneurysm. 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 Reproductive: Heterogeneous soft tissue mass involving the uterine body and cervix shows no significant change in size since previous study. This measures 6.2 x 4.0 cm on image 105/2, compared to 6.1 x 4.3 cm previously. Deep myometrial invasion is seen, with probable adjacent extra uterine extension. Adnexal regions are unremarkable. Other: Mild ascites is increased since previous study. Soft tissue thickening and nodularity involving the omental fat appears stable, consistent with peritoneal carcinoma. Increased diffuse body wall edema also noted. Musculoskeletal:  No suspicious bone lesions identified. IMPRESSION: Increased size of two 1 cm low-attenuation lesions along the capsular surface of the liver dome, suspicious for peritoneal carcinoma. Other areas of soft tissue density in the omental fat are unchanged, consistent with peritoneal carcinomatosis. No significant change in soft tissue mass in the lower uterine segment and cervix, with probable adjacent extra-uterine extension. Increased bilateral pleural effusions, mild ascites, and diffuse body wall edema. Stable tiny indeterminate sub-cm bilateral upper lobe pulmonary nodules. Recommend  continued follow-up by chest CT in 6 months. 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 Cholelithiasis.  No radiographic evidence of cholecystitis. Electronically Signed   By: Earle Gell M.D.   On: 08/27/2017 11:51   Ir Removal [employer] W/ [address] W/o Fl Mod Sed  Result Date: 08/27/2017 INDICATION: 67 year old female with a history port catheter removal secondary to fungemia EXAM: REMOVAL RIGHT IJ VEIN PORT-A-CATH MEDICATIONS: 2.0 g Ancef; The antibiotic was administered within an appropriate time interval prior to skin puncture. ANESTHESIA/SEDATION: Moderate (conscious) sedation was employed during this procedure. A total of Versed 1.0 mg and Fentanyl 50 mcg was administered intravenously. Moderate Sedation Time: 10 minutes. The patient's level of consciousness and vital signs were monitored continuously by radiology nursing throughout the procedure under my direct supervision. FLUOROSCOPY TIME:  Fluoroscopy none COMPLICATIONS: None PROCEDURE: Informed consent was obtained from the patient following an explanation of the procedure, risks, benefits and alternatives. The patient understands, agrees and consents for the procedure. All questions were addressed. A time out was performed prior to the initiation of the procedure. The patient was positioned in the operation suite in the supine position on a gantry. The previous scar on the right chest was generously infiltrated with 1% lidocaine for local anesthesia. Infiltration of the skin and subcutaneous tissues surrounding the port was performed. Using sharp and blunt dissection, the port apparatus and subcutaneous catheter were removed in their entirety. The port pocket was then closed with interrupted Vicryl layer and a running subcuticular with 4-0 Monocryl. The skin was sealed with Derma bond. A sterile dressing was placed. The patient tolerated the procedure well and remained hemodynamically stable throughout. No complications were  encountered and no significant blood loss was encountered. IMPRESSION: Status post removal of right IJ port catheter. Signed, Dulcy Fanny. Earleen Newport, DO [facility] Electronically Signed   By: Corrie Mckusick D.O.  On: 08/27/2017 09:08

## 2017-08-29 ENCOUNTER — Ambulatory Visit: Payer: Self-pay

## 2017-08-29 ENCOUNTER — Other Ambulatory Visit: Payer: Self-pay

## 2017-08-29 ENCOUNTER — Encounter: Payer: Self-pay | Admitting: Nutrition

## 2017-08-29 ENCOUNTER — Ambulatory Visit: Payer: Self-pay | Admitting: Hematology and Oncology

## 2017-08-29 DIAGNOSIS — D72829 Elevated white blood cell count, unspecified: Secondary | ICD-10-CM

## 2017-08-29 LAB — CBC
HEMATOCRIT: 28.9 % — AB (ref 36.0–46.0)
Hemoglobin: 9 g/dL — ABNORMAL LOW (ref 12.0–15.0)
MCH: 28.4 pg (ref 26.0–34.0)
MCHC: 31.1 g/dL (ref 30.0–36.0)
MCV: 91.2 fL (ref 78.0–100.0)
PLATELETS: 351 10*3/uL (ref 150–400)
RBC: 3.17 MIL/uL — ABNORMAL LOW (ref 3.87–5.11)
RDW: 17.7 % — AB (ref 11.5–15.5)
WBC: 21.1 10*3/uL — ABNORMAL HIGH (ref 4.0–10.5)

## 2017-08-29 LAB — BASIC METABOLIC PANEL
Anion gap: 14 (ref 5–15)
BUN: 34 mg/dL — ABNORMAL HIGH (ref 6–20)
CO2: 26 mmol/L (ref 22–32)
CREATININE: 1.49 mg/dL — AB (ref 0.44–1.00)
Calcium: 8.5 mg/dL — ABNORMAL LOW (ref 8.9–10.3)
Chloride: 99 mmol/L — ABNORMAL LOW (ref 101–111)
GFR calc Af Amer: 40 mL/min — ABNORMAL LOW (ref >60)
GFR, EST NON AFRICAN AMERICAN: 35 mL/min — AB (ref >60)
GLUCOSE: 125 mg/dL — AB (ref 65–99)
Potassium: 4.4 mmol/L (ref 3.5–5.1)
Sodium: 139 mmol/L (ref 135–145)

## 2017-08-29 MED ORDER — DEXAMETHASONE 4 MG PO TABS
2.0000 mg | ORAL_TABLET | Freq: Every day | ORAL | Status: DC
  Administered 2017-08-29 – 2017-08-31 (×3): 2 mg via ORAL
  Filled 2017-08-29 (×3): qty 1

## 2017-08-29 NOTE — Progress Notes (Signed)
PT Cancellation Note  Patient Details Name: STELA IWASAKI MRN: [id number] DOB: Apr 12, 1951   Cancelled Treatment:     attempted to see twice 1.  Eating lunch    2.  Back in bed resting Pt has been evaluated and PT rec SNF   Rica Koyanagi  PTA [facility]  Acute  Rehab Pager      [phone]

## 2017-08-29 NOTE — Progress Notes (Signed)
Subjective: No new complaints   Antibiotics:  Anti-infectives (From admission, onward)   Start     Dose/Rate Route Frequency Ordered Stop   08/26/17 1600  ceFAZolin (ANCEF) IVPB 2g/100 mL premix     2 g 200 mL/hr over 30 Minutes Intravenous  Once 08/25/17 1534 08/26/17 1648   08/25/17 1503  ceFAZolin (ANCEF) 2-4 GM/100ML-% IVPB    Note to Pharmacy:  Sherle Poe   : cabinet override      08/25/17 1503 08/26/17 0314   08/23/17 1400  anidulafungin (ERAXIS) 100 mg in sodium chloride 0.9 % 100 mL IVPB     100 mg 78 mL/hr over 100 Minutes Intravenous Every 24 hours 08/22/17 1231     08/22/17 1400  anidulafungin (ERAXIS) 200 mg in sodium chloride 0.9 % 200 mL IVPB     200 mg 78 mL/hr over 200 Minutes Intravenous  Once 08/22/17 1231 08/22/17 1703      Medications: Scheduled Meds: . dexamethasone  2 mg Oral Daily  . divalproex  500 mg Oral QHS  . feeding supplement (ENSURE ENLIVE)  237 mL Oral BID BM  . magnesium oxide  400 mg Oral BID  . risperiDONE  1 mg Oral Daily   Continuous Infusions: . anidulafungin 100 mg (08/29/17 1441)   PRN Meds:.acetaminophen **OR** acetaminophen, HYDROcodone-acetaminophen, ondansetron **OR** ondansetron (ZOFRAN) IV    Objective: Weight change:   Intake/Output Summary (Last 24 hours) at 08/29/2017 1658 Last data filed at 08/29/2017 0630 Gross per 24 hour  Intake 60 ml  Output 400 ml  Net -340 ml   Blood pressure 121/75, pulse (!) 110, temperature 97.6 F (36.4 C), temperature source Oral, resp. rate 16, height 5\' 4"  (1.626 m), weight 159 lb 13.3 oz (72.5 kg), SpO2 95 %. Temp:  [97.6 F (36.4 C)-98.9 F (37.2 C)] 97.6 F (36.4 C) (05/10 1303) Pulse Rate:  [93-112] 110 (05/10 1303) Resp:  [16-18] 16 (05/10 1303) BP: (121-129)/(75-88) 121/75 (05/10 1303) SpO2:  [94 %-95 %] 95 % (05/10 1303)  Physical Exam: General: Alert and awake, oriented x3,  HEENT: anicteric sclera,  EOMI CVS regular rate, normal r,  no murmur rubs or  gallops Chest:  no wheezing, resp distress Abdomen, nondistended, soft Neuro: nonfocal  CBC:  CBC Latest Ref Rng & Units 08/29/2017 08/28/2017 08/28/2017  WBC 4.0 - 10.5 K/uL 21.1(H) - 19.9(H)  Hemoglobin 12.0 - 15.0 g/dL 9.0(L) 9.3(L) 9.1(L)  Hematocrit 36.0 - 46.0 % 28.9(L) 29.3(L) 28.9(L)  Platelets 150 - 400 K/uL 351 - 328      BMET Recent Labs    08/28/17 0446 08/29/17 0429  NA 136 139  K 4.3 4.4  CL 99* 99*  CO2 26 26  GLUCOSE 108* 125*  BUN 32* 34*  CREATININE 1.28* 1.49*  CALCIUM 8.4* 8.5*     Liver Panel  No results for input(s): PROT, ALBUMIN, AST, ALT, ALKPHOS, BILITOT, BILIDIR, IBILI in the last 72 hours.     Sedimentation Rate No results for input(s): ESRSEDRATE in the last 72 hours. C-Reactive Protein No results for input(s): CRP in the last 72 hours.  Micro Results: Recent Results (from the past 720 hour(s))  Urine Culture     Status: Abnormal   Collection Time: 08/11/17  1:24 PM  Result Value Ref Range Status   Specimen Description   Final    URINE, RANDOM Performed at [facility] [address] [place], [place] [address]    Special Requests  Final    NONE Performed at [facility], [place] [address], [place], [place] [address]    Culture MULTIPLE SPECIES PRESENT, SUGGEST RECOLLECTION (A)  Final   Report Status 08/13/2017 FINAL  Final  Blood Culture (routine x 2)     Status: Abnormal   Collection Time: 08/17/17  2:33 PM  Result Value Ref Range Status   Specimen Description   Final    BLOOD LEFT ANTECUBITAL Performed at [facility] [address], [place], [place] [address]    Special Requests   Final    BOTTLES DRAWN AEROBIC AND ANAEROBIC Blood Culture adequate volume Performed at [facility] [address] [place], [place] [address]    Culture  Setup Time   Final    YEAST AEROBIC BOTTLE ONLY CRITICAL RESULT CALLED TO, READ BACK BY AND VERIFIED WITH: DR  Lurline Del [id number] MLM Performed at [facility], [place] [address] [place], [place] [address]    Culture CANDIDA GLABRATA (A)  Final   Report Status 08/23/2017 FINAL  Final  Blood Culture ID Panel (Reflexed)     Status: Abnormal   Collection Time: 08/17/17  2:33 PM  Result Value Ref Range Status   Enterococcus species NOT DETECTED NOT DETECTED Final   Listeria monocytogenes NOT DETECTED NOT DETECTED Final   Staphylococcus species NOT DETECTED NOT DETECTED Final   Staphylococcus aureus NOT DETECTED NOT DETECTED Final   Streptococcus species NOT DETECTED NOT DETECTED Final   Streptococcus agalactiae NOT DETECTED NOT DETECTED Final   Streptococcus pneumoniae NOT DETECTED NOT DETECTED Final   Streptococcus pyogenes NOT DETECTED NOT DETECTED Final   Acinetobacter baumannii NOT DETECTED NOT DETECTED Final   Enterobacteriaceae species NOT DETECTED NOT DETECTED Final   Enterobacter cloacae complex NOT DETECTED NOT DETECTED Final   Escherichia coli NOT DETECTED NOT DETECTED Final   Klebsiella oxytoca NOT DETECTED NOT DETECTED Final   Klebsiella pneumoniae NOT DETECTED NOT DETECTED Final   Proteus species NOT DETECTED NOT DETECTED Final   Serratia marcescens NOT DETECTED NOT DETECTED Final   Haemophilus influenzae NOT DETECTED NOT DETECTED Final   Neisseria meningitidis NOT DETECTED NOT DETECTED Final   Pseudomonas aeruginosa NOT DETECTED NOT DETECTED Final   Candida albicans NOT DETECTED NOT DETECTED Final   Candida glabrata DETECTED (A) NOT DETECTED Final    Comment: CRITICAL RESULT CALLED TO, READ BACK BY AND VERIFIED WITH: DR Lurline Del [id number] MLM    Candida krusei NOT DETECTED NOT DETECTED Final   Candida parapsilosis NOT DETECTED NOT DETECTED Final   Candida tropicalis NOT DETECTED NOT DETECTED Final    Comment: Performed at [facility], [address] [place], [place] [address]  Blood Culture (routine x 2)     Status: None   Collection Time: 08/17/17  2:45 PM    Result Value Ref Range Status   Specimen Description   Final    BLOOD PORTA CATH Performed at [facility] [address], [place], [place] [address]    Special Requests   Final    BOTTLES DRAWN AEROBIC AND ANAEROBIC Blood Culture results may not be optimal due to an excessive volume of blood received in culture bottles Performed at [facility] [address], [place], [place] [address]    Culture   Final    NO GROWTH 5 DAYS Performed at [facility], [place] [address], [place], [place] [address]    Report  Status 08/22/2017 FINAL  Final  Urine culture     Status: None   Collection Time: 08/17/17  5:00 PM  Result Value Ref Range Status   Specimen Description   Final    URINE, RANDOM Performed at [facility] [address], [place], [place] [address]    Special Requests   Final    NONE Performed at [facility], [place] [address], [place], [place] [address]    Culture   Final    NO GROWTH Performed at [facility], [place] [address], [place], [place] [address]    Report Status 08/19/2017 FINAL  Final  Blood culture (routine x 2)     Status: None   Collection Time: 08/22/17  1:00 PM  Result Value Ref Range Status   Specimen Description   Final    BLOOD LEFT ANTECUBITAL Performed at [facility] [address] [place], [place] [address]    Special Requests   Final    BOTTLES DRAWN AEROBIC AND ANAEROBIC Blood Culture adequate volume Performed at [facility] [address] [place], [place] [address]    Culture   Final    NO GROWTH 5 DAYS Performed at [facility], [place] [address] [place], [place] [address]    Report Status 08/27/2017 FINAL  Final  Blood culture (routine x 2)     Status: None   Collection Time: 08/22/17  1:13 PM  Result Value Ref Range Status   Specimen Description   Final    BLOOD RIGHT ANTECUBITAL Performed at [facility] [address] [place], [place] [address]    Special Requests   Final    BOTTLES DRAWN AEROBIC AND ANAEROBIC Blood Culture adequate volume Performed at [facility] [address] [place], [place] [address]    Culture   Final    NO GROWTH 5 DAYS Performed at [facility], [place] [address], [place], [place] [address]    Report Status 08/27/2017 FINAL  Final  Culture, blood (routine x 2)     Status: None (Preliminary result)   Collection Time: 08/28/17  9:37 AM  Result Value Ref Range Status   Specimen Description   Final    BLOOD LEFT ARM Performed at [facility] [address] [place], [place] [address]    Special Requests   Final    BOTTLES DRAWN AEROBIC AND ANAEROBIC Blood Culture adequate volume Performed at [facility] [address], [place], [place] [address]    Culture   Final    NO GROWTH < 24 HOURS Performed at [facility] [address], [place], [place] [address]    Report Status PENDING  Incomplete  Culture, blood (routine x 2)     Status: None (Preliminary result)   Collection Time: 08/28/17  9:43 AM  Result Value Ref Range Status   Specimen Description   Final    BLOOD RIGHT WRIST Performed at [facility] [address] [place], [place] [address]    Special Requests   Final    AEROBIC BOTTLE ONLY Blood Culture adequate volume Performed at [facility] [address], [place], [place] [address]    Culture   Final    NO GROWTH < 24 HOURS Performed at [facility] [address] [place],  [address]    Report Status PENDING  Incomplete  Studies/Results: No results found.    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 Her optho exam was clear She has had catheter removed  #1 Catheter associated C glabrata fungemia:  --repeat blood cultures pending from yesterday --I would make sure her post catheter cultures were NGTD at 48 hours prior to placing a new line  she should get 2 weeks of echinocandin from date of blood cultures being negative POST catheter removal.   Last date of antibiotics would be May 22nd, 2019   Diagnosis: C Glabrata fungemia  Culture Result: C glabrata  No Known Allergies  OPAT Orders Discharge antibiotics: ERaxis or formulary echinocandin   Duration: 2 weeks  End Date: 09/10/2017  [facility] Care Per Protocol:  Labs weekly while on IV antibiotics: _x_ CBC with differential  _x_ CMP  __ Please pull PIC at completion of IV antibiotics x__ Please leave PIC in place until doctor has seen patient or been notified  Oncology may wish to use it  Fax weekly labs to [phone]  Clinic Follow Up Appt:  3-4 weeks  Dr. Megan Salon is available this weekend for questions.        LOS: 7 days   Alcide Evener 08/29/2017, 4:58 PM

## 2017-08-29 NOTE — Progress Notes (Signed)
Patient will return to [place] if medically stable for transfer.  The weekend CSW will assist with the process.   Kathrin Greathouse, Latanya Presser, MSW Clinical Social Worker  [phone] 08/29/2017  3:32 PM

## 2017-08-29 NOTE — Progress Notes (Signed)
PHARMACY CONSULT NOTE FOR:  OUTPATIENT  PARENTERAL ANTIBIOTIC THERAPY (OPAT)  Indication: Fungemia Regimen: Anidulafungin 100mg  IV q24h  End date: 09/10/2017  IV antibiotic discharge orders are pended. To discharging provider:  please sign these orders via discharge navigator,  Select New Orders & click on the button choice - Manage This Unsigned Work.     Thank you for allowing pharmacy to be a part of this patient's care.  Luiz Ochoa 08/29/2017, 9:51 PM

## 2017-08-29 NOTE — Progress Notes (Signed)
PROGRESS NOTE    Emily Duarte  XHB:[id number] DOB: 09-Oct-1950 DOA: 08/22/2017 PCP: Nolene Ebbs, MD     Brief Narrative:  HPI On 08/22/2017 by Dr. Riccardo Dubin [name] a 67 y.o.femalewith medical history significant ofendometrial cancer, hypertension, schizophrenia, left common femoral vein deep vein thrombosis. She had a recent hospitalization April 28 to May 1st,2019 due to hypotension related to adrenal insufficiency;she was discharged in a stable condition on dexamethasone hormone supplemental therapy.Since her discharge 48 hours ago, she has been experiencing significant weakness,moderate to severe in intensity,worse with exertion, no improving factors, associated with poor oral intake, no fevers, no chills, no diarrhea, no nauseaorvomiting. Her blood cultures from April 28 tested positive for Candida Christ Kick was called to come back to the hospital for further therapy. She does have a right internal jugular vein Port-A-Cath(May 2018),that she used for chemotherapy. She received carboplatin and Taxol for 6 cycles.Apparently she tolerated treatment very poorly with progressive weight loss and renal failure. Currently on daily Doxil no further cisplatin.  Interim history: Infectious disease was consulted and patient started on Eraxis. Port-A-Cath was removed by IR on 5/7. She was evaluated by oncology and pending CT chest/abd/pelvis to evaluate for cancer progression. Discussed with Dr. Alvy Bimler; CT indicates stable disease and she will arrange for follow up in 2 weeks as outpatient.   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  Hold chemotherapy for at least 2 weeks from first date of negative blood cultures -Ophthalmology consulted and appreciated, found no evidence of intraocular involvement.  Mild ocular hypertension recommended outpatient assessment -Continue Eraxis  -Repeat blood cultures pending from 5/9. Per ID, plan for 2 weeks treatment from date of negative blood culture post catheter removal.   Adrenal insufficiency -Patient was recently hospitalized for adrenal insufficiency and discharged on Aug 20, 2017 -Currently blood pressure stable, continue decadron at decreased dose   Left common femoral DVT -Diagnosed March 2019 -Xarelto held due to active vaginal bleeding   Endometrial carcinoma cancer associated pain and severe protein calorie malnutrition -Follows with Dr. Alvy Bimler, last chemo 08/01/17  -Follow up outpatient in 2 weeks   Depression/schizophrenia -Continue depakote, risperdal   Normocytic anemia, chronic -Continue to monitor CBC. Hgb stable   Chronic kidney disease, stage III -Baseline Cr 1.1-1.5. Continue to monitor BMP. Cr stable   Severe malnutrition -Nutrition consulted and appreciated, continue nutritional supplements  Stage 2 pressure injury of buttock, POA -Supportive care, off loading and turns    DVT prophylaxis: subq hep Code Status: Full Family Communication: No family at bedside Disposition Plan: Pending repeat blood culture. Will return to [facility] on discharge.    Consultants:   Infectious disease  IR  Ophthalmology  Oncology  Procedures:   Port-A-Cath removal 5/7   Antimicrobials:  Anti-infectives (From  admission, onward)   Start     Dose/Rate Route Frequency Ordered Stop   08/26/17 1600  ceFAZolin (ANCEF)  IVPB 2g/100 mL premix     2 g 200 mL/hr over 30 Minutes Intravenous  Once 08/25/17 1534 08/26/17 1648   08/25/17 1503  ceFAZolin (ANCEF) 2-4 GM/100ML-% IVPB    Note to Pharmacy:  Sherle Poe   : cabinet override      08/25/17 1503 08/26/17 0314   08/23/17 1400  anidulafungin (ERAXIS) 100 mg in sodium chloride 0.9 % 100 mL IVPB     100 mg 78 mL/hr over 100 Minutes Intravenous Every 24 hours 08/22/17 1231     08/22/17 1400  anidulafungin (ERAXIS) 200 mg in sodium chloride 0.9 % 200 mL IVPB     200 mg 78 mL/hr over 200 Minutes Intravenous  Once 08/22/17 1231 08/22/17 1703       Subjective: Continued to have vaginal bleeding yesterday. No complaints of dizziness, lightheadedness, pain, nausea, vomiting.   Objective: Vitals:   08/28/17 0549 08/28/17 1254 08/28/17 2043 08/29/17 0621  BP: 138/82 114/83 129/88 127/84  Pulse: 85 (!) 113 (!) 112 93  Resp: 18 16 18 16   Temp: 97.9 F (36.6 C) 98 F (36.7 C) 98.9 F (37.2 C) 97.9 F (36.6 C)  TempSrc: Oral Oral Oral Oral  SpO2: 96% 95% 95% 94%  Weight:      Height:        Intake/Output Summary (Last 24 hours) at 08/29/2017 0847 Last data filed at 08/29/2017 0630 Gross per 24 hour  Intake 300 ml  Output 1200 ml  Net -900 ml   Filed Weights   08/22/17 1233 08/24/17 0513  Weight: 76.2 kg (168 lb) 72.5 kg (159 lb 13.3 oz)    Examination: General exam: Appears calm and comfortable  Respiratory system: Clear to auscultation. Respiratory effort normal. Cardiovascular system: S1 & S2 heard, RRR. No JVD, murmurs, rubs, gallops or clicks. No pedal edema. Gastrointestinal system: Abdomen is nondistended, soft and nontender. No organomegaly or masses felt. Normal bowel sounds heard. Central nervous system: Alert and oriented. No focal neurological deficits. Extremities: Symmetric 5 x 5 power. Skin: No rashes, lesions or  ulcers Psychiatry: Flat affect     Data Reviewed: I have personally reviewed following labs and imaging studies  CBC: Recent Labs  Lab 08/22/17 1242 08/23/17 0504 08/24/17 0420 08/25/17 0448 08/27/17 0434 08/28/17 0446 08/28/17 1714 08/29/17 0429  WBC 18.4* 17.9* 17.4* 17.3* 19.4* 19.9*  --  21.1*  NEUTROABS 16.1* 15.0*  --   --   --   --   --   --   HGB 10.2* 9.5* 9.3* 9.4* 9.2* 9.1* 9.3* 9.0*  HCT 31.4* 30.1* 29.4* 28.8* 28.9* 28.9* 29.3* 28.9*  MCV 89.0 90.1 90.5 89.7 91.5 91.2  --  91.2  PLT 269 278 280 313 330 328  --  932   Basic Metabolic Panel: Recent Labs  Lab 08/24/17 0420 08/25/17 0448 08/27/17 0434 08/28/17 0446 08/29/17 0429  NA 137 138 138 136 139  K 3.9 3.6 3.9 4.3 4.4  CL 100* 100* 97* 99* 99*  CO2 27 27 28 26 26   GLUCOSE 114* 101* 106* 108* 125*  BUN 35* 39* 31* 32* 34*  CREATININE 1.18* 1.33* 1.20* 1.28* 1.49*  CALCIUM 8.2* 8.3* 8.4* 8.4* 8.5*   GFR: Estimated Creatinine Clearance: 35.3 mL/min (A) (by C-G formula based on SCr of 1.49 mg/dL (H)). Liver Function Tests: Recent Labs  Lab 08/23/17 0504  AST 26  ALT 20  ALKPHOS 103  BILITOT <0.1*  PROT 5.3*  ALBUMIN 1.6*   No results for input(s): LIPASE,  AMYLASE in the last 168 hours. No results for input(s): AMMONIA in the last 168 hours. Coagulation Profile: No results for input(s): INR, PROTIME in the last 168 hours. Cardiac Enzymes: No results for input(s): CKTOTAL, CKMB, CKMBINDEX, TROPONINI in the last 168 hours. BNP (last 3 results) No results for input(s): PROBNP in the last 8760 hours. HbA1C: No results for input(s): HGBA1C in the last 72 hours. CBG: No results for input(s): GLUCAP in the last 168 hours. Lipid Profile: No results for input(s): CHOL, HDL, LDLCALC, TRIG, CHOLHDL, LDLDIRECT in the last 72 hours. Thyroid Function Tests: No results for input(s): TSH, T4TOTAL, FREET4, T3FREE, THYROIDAB in the last 72 hours. Anemia Panel: No results for input(s): VITAMINB12,  FOLATE, FERRITIN, TIBC, IRON, RETICCTPCT in the last 72 hours. Sepsis Labs: No results for input(s): PROCALCITON, LATICACIDVEN in the last 168 hours.  Recent Results (from the past 240 hour(s))  Blood culture (routine x 2)     Status: None   Collection Time: 08/22/17  1:00 PM  Result Value Ref Range Status   Specimen Description   Final    BLOOD LEFT ANTECUBITAL Performed at [facility] [address], [place], [place] [address]    Special Requests   Final    BOTTLES DRAWN AEROBIC AND ANAEROBIC Blood Culture adequate volume Performed at [facility] [address], [place], [place] [address]    Culture   Final    NO GROWTH 5 DAYS Performed at [facility], [place] [address] [place], [place] [address]    Report Status 08/27/2017 FINAL  Final  Blood culture (routine x 2)     Status: None   Collection Time: 08/22/17  1:13 PM  Result Value Ref Range Status   Specimen Description   Final    BLOOD RIGHT ANTECUBITAL Performed at [facility] [address], [place], [place] [address]    Special Requests   Final    BOTTLES DRAWN AEROBIC AND ANAEROBIC Blood Culture adequate volume Performed at [facility] [address], [place], [place] [address]    Culture   Final    NO GROWTH 5 DAYS Performed at [facility], [place] [address] [place], [place] [address]    Report Status 08/27/2017 FINAL  Final       Radiology Studies: No results found.    Scheduled Meds: . dexamethasone  2 mg Oral Daily  . divalproex  500 mg Oral QHS  . feeding supplement (ENSURE ENLIVE)  237 mL Oral BID BM  . magnesium oxide  400 mg Oral BID  . risperiDONE  1 mg Oral Daily   Continuous Infusions: . anidulafungin Stopped (08/28/17 1759)     LOS: 7 days    Time spent: 20 minutes   Dessa Phi, DO [facility] [email or web address] Password [facility] 08/29/2017, 8:47 AM

## 2017-08-29 NOTE — Progress Notes (Signed)
Emily Duarte   DOB:07/08/1950   ZO#:[id number]    Assessment & Plan:  Endometrial/uterine adenocarcinoma (Emily Duarte) She tolerated treatment very poorly with progressive weight loss and acute on chronic renal failure,recurrent hospitalization and now life-threatening infection  The last dose of chemotherapy was on 08/01/2017 Recent tumor markers were elevated; repeat levels this week was mildly improved CT scan overall shows stable disease except for small subcapsular nodules.  Continue supportive care for now.  It is not clear to me whether she will recover well in 2 weeks to resume treatment but her son would like her to continue on aggressive supportive care  Anemia, chronic disease She has received recent blood transfusion She is not symptomatic today  Acute prerenal failure (Emily Duarte), stable She has acute on chronic renal failure likely secondary to poor oral fluid intake and dehydration Encourage oral intake as tolerated  Fungal infection The port is removed. Will not resume treatment for at least 2 weeks per ID recommendation Blood cultures are pending  Persistent leukocytosis She is on high-dose dexamethasone that was prescribed for appetite stimulant from outpatient clinic I do not believe this is helpful and cannot affect clinical decision I will reduce dexamethasone to 2 mg daily by mouth from 4 mg twice daily, with plan to slowly wean her off over the next few days Encourage oral intake as tolerated  Protein-calorie malnutrition,severe(Emily Duarte) The cause of protein calorie malnutrition is due to poor oral intake and progression of cancer She has been seen by dietitian  Left femoral vein DVT (Emily Duarte) I discussed extensively with her son She is on anticoagulation therapy and the risk of bleeding has to be balanced with the benefits of anticoagulation treatment For now, Xarelto is placed on hold due to ongoing active vaginal bleeding  Cancer associated pain She has  moderate cancer pain, currently well controlled Her pain is stable  Goals of care discussion The patient's mental health illness precluded meaningful discussion about goals of care Her son is her dedicated healthcare power of attorney I updated her son today.  Discharge planning The patient is close to being discharged to skilled nursing facility She has outpatient follow-up in case she get discharged this weekend  Emily Lark, MD 08/29/2017  7:48 AM   Subjective:  She feels comfortable.  She continues to have persistent recurrent vaginal bleeding.  She denies nausea or pain. Blood cultures are pending  Objective:  Vitals:   08/28/17 2043 08/29/17 0621  BP: 129/88 127/84  Pulse: (!) 112 93  Resp: 18 16  Temp: 98.9 F (37.2 C) 97.9 F (36.6 C)  SpO2: 95% 94%     Intake/Output Summary (Last 24 hours) at 08/29/2017 0748 Last data filed at 08/29/2017 0630 Gross per 24 hour  Intake 300 ml  Output 1200 ml  Net -900 ml    GENERAL:alert, no distress and comfortable ABDOMEN:abdomen soft, persistent palpable mass, non-tender and normal bowel sounds Musculoskeletal:no cyanosis of digits and no clubbing  NEURO: alert & oriented x 3 with fluent speech, no focal motor/sensory deficits   Labs:  Lab Results  Component Value Date   WBC 21.1 (H) 08/29/2017   HGB 9.0 (L) 08/29/2017   HCT 28.9 (L) 08/29/2017   MCV 91.2 08/29/2017   PLT 351 08/29/2017   NEUTROABS 15.0 (H) 08/23/2017    Lab Results  Component Value Date   NA 139 08/29/2017   K 4.4 08/29/2017   CL 99 (L) 08/29/2017   CO2 26 08/29/2017

## 2017-08-30 ENCOUNTER — Inpatient Hospital Stay: Payer: Self-pay

## 2017-08-30 LAB — BASIC METABOLIC PANEL
Anion gap: 13 (ref 5–15)
BUN: 35 mg/dL — ABNORMAL HIGH (ref 6–20)
CALCIUM: 8.5 mg/dL — AB (ref 8.9–10.3)
CHLORIDE: 97 mmol/L — AB (ref 101–111)
CO2: 26 mmol/L (ref 22–32)
CREATININE: 1.58 mg/dL — AB (ref 0.44–1.00)
GFR calc Af Amer: 38 mL/min — ABNORMAL LOW (ref >60)
GFR, EST NON AFRICAN AMERICAN: 33 mL/min — AB (ref >60)
Glucose, Bld: 95 mg/dL (ref 65–99)
Potassium: 4.3 mmol/L (ref 3.5–5.1)
SODIUM: 136 mmol/L (ref 135–145)

## 2017-08-30 LAB — CBC
HCT: 30.3 % — ABNORMAL LOW (ref 36.0–46.0)
Hemoglobin: 9.5 g/dL — ABNORMAL LOW (ref 12.0–15.0)
MCH: 28.9 pg (ref 26.0–34.0)
MCHC: 31.4 g/dL (ref 30.0–36.0)
MCV: 92.1 fL (ref 78.0–100.0)
PLATELETS: 316 10*3/uL (ref 150–400)
RBC: 3.29 MIL/uL — ABNORMAL LOW (ref 3.87–5.11)
RDW: 17.9 % — AB (ref 11.5–15.5)
WBC: 20.6 10*3/uL — ABNORMAL HIGH (ref 4.0–10.5)

## 2017-08-30 MED ORDER — ANIDULAFUNGIN IV (FOR PTA / DISCHARGE USE ONLY): 100.0000 mg | INTRAVENOUS | 0 refills | Status: DC

## 2017-08-30 MED ORDER — SODIUM CHLORIDE 0.9% FLUSH
10.0000 mL | Freq: Two times a day (BID) | INTRAVENOUS | Status: DC
  Administered 2017-08-30 – 2017-08-31 (×2): 10 mL

## 2017-08-30 MED ORDER — SODIUM CHLORIDE 0.9% FLUSH: 10.0000 mL | INTRAVENOUS | Status: DC | PRN

## 2017-08-30 MED ORDER — DEXAMETHASONE 2 MG PO TABS: 2.0000 mg | ORAL_TABLET | Freq: Every day | ORAL | 0 refills | Status: DC

## 2017-08-30 MED ORDER — RISPERIDONE 0.5 MG PO TABS: 0.5000 mg | ORAL_TABLET | Freq: Every day | ORAL | 0 refills | Status: DC

## 2017-08-30 NOTE — Discharge Summary (Addendum)
Physician Discharge Summary  Emily Duarte JXB:[id number] DOB: 28-Jun-1950 DOA: 08/22/2017  PCP: Emily Ebbs, MD  Admit date: 08/22/2017 Discharge date: 08/31/2017  Admitted From: SNF Disposition:  SNF   Recommendations for Outpatient Follow-up:  1. Follow up with PCP in 1 week 2. Follow up with Emily Duarte in 2 weeks ON 5/23 3. Follow up with Infectious Disease in 3-4 weeks  4. Please follow up on the following pending results: final blood culture drawn 5/9, negative at 48 hours on day of discharge  5. Last day anidulafungin 5/22. Obtain weekly CBC with differential, CMP. Fax labs to [phone].  6. Leave PICC in place until further notice by oncology.  7. Trend CBC to watch leukocytosis. Trend BMP to watch kidney function.   Discharge Condition: Stable CODE STATUS: Full  Diet recommendation: Regular diet   Brief/Interim Summary: HPI On 08/22/2017 by Dr. Riccardo Duarte Emily Duarte a 67 y.o.femalewith medical history significant ofendometrial cancer, hypertension, schizophrenia, left common femoral vein deep vein thrombosis. She had a recent hospitalization April 28 to May 1st,2019 due to hypotension related to adrenal insufficiency;she was discharged in a stable condition on dexamethasone hormone supplemental therapy.Since her discharge 48 hours ago, she has been experiencing significant weakness,moderate to severe in intensity,worse with exertion, no improving factors, associated with poor oral intake, no fevers, no chills, no diarrhea, no nauseaorvomiting. Her blood cultures from April 28 tested positive for Candida Christ Kick was called to come back to the hospital for further therapy. She does have a right internal jugular vein Port-A-Cath(May 2018),that she used for chemotherapy. She received carboplatin and Taxol for 6 cycles.Apparently she tolerated treatment very poorly with progressive weight loss and renal failure. Currently on daily Doxil no  further cisplatin.  Interim history: Infectious disease was consulted and patient started on Eraxis. Port-A-Cath was removed by IR on 5/7. She was evaluated by oncology and pending CT chest/abd/pelvis to evaluate for cancer progression. Discussed with Emily Duarte; CT indicates stable disease and she will arrange for follow up in 2 weeks as outpatient. Repeat blood cultures remained negative at 48 hours. PICC line was placed for total 2 weeks of antifungal therapy.   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 Hold chemotherapy for at least 2 weeks from first date of negative blood cultures -Ophthalmology consulted and appreciated, found no evidence of intraocular involvement. Mild ocular hypertension recommended outpatient assessment -Repeat blood cultures pending from 5/9, negative at 48 hours  -PICC ordered today. Continue Eraxis, last day 5/22   Adrenal insufficiency -Patient was recently hospitalized for adrenal insufficiency and discharged on Aug 20, 2017 -Currently blood pressure stable, continue decadron at decreased dose   Left common femoral DVT -Diagnosed March 2019 -Xarelto held due to active vaginal bleeding. Hgb remains stable. Resume anticoagulation per oncology recommendation at next follow up visit.   Endometrial carcinoma cancer associated  pain and severe protein calorie malnutrition -Follows with Emily Duarte, last chemo 08/01/17  -  Follow up outpatient in 2 weeks   Depression/schizophrenia -Continue depakote, risperdal   Normocytic anemia, chronic -Continue to monitor CBC. Hgb stable   Chronic kidney disease, stage III -Baseline Cr 1.1-1.5. Continue to monitor BMP -Give IVF today and continue to encourage oral intake. UOP has been adequate.   Severe malnutrition -Nutritionconsulted and appreciated, continue nutritional supplements  Stage 2 pressure injury of buttock, POA -Supportive care, off loading and turns    Discharge Instructions  Discharge Instructions    Diet general   Complete by:  As directed    Home infusion instructions [facility] May follow [place] Dosing Protocol; May administer Cathflo as needed to maintain patency of vascular access device.; [place]ushing of vascular access device: per [facility] Protocol: 0.9% NaCl pre/post medica...   Complete by:  As directed    Instructions:  May follow [place] Dosing Protocol   Instructions:  May administer Cathflo as needed to maintain patency of vascular access device.   Instructions:  Flushing of vascular access device: per [facility] Protocol: 0.9% NaCl pre/post medication administration and prn patency; Heparin 100 u/ml, 1ml for implanted ports and Heparin 10u/ml, 22ml for all other central venous catheters.   Instructions:  May follow [facility] Anaphylaxis Protocol for First Dose Administration in the home: 0.9% NaCl at 25-50 ml/hr to maintain IV access for protocol meds. Epinephrine 0.3 ml IV/IM PRN and Benadryl 25-50 IV/IM PRN s/s of anaphylaxis.   Instructions:  [place] Infusion Coordinator (RN) to assist per patient IV care needs in the home PRN.   Increase activity slowly   Complete by:  As directed      Allergies as of 08/31/2017   No Known Allergies     Medication List    STOP taking these medications   HYDROcodone-acetaminophen  10-325 MG tablet Commonly known as:  NORCO   potassium chloride 10 MEQ tablet Commonly known as:  K-DUR,KLOR-CON   rivaroxaban 20 MG Tabs tablet Commonly known as:  XARELTO   TUBERCULIN PPD ID     TAKE these medications   anidulafungin IVPB Commonly known as:  ERAXIS Inject 100 mg into the vein daily for 11 days. Indication: fungemia Last Day of Therapy: 09/10/2017 Labs - Once weekly:  CBC/D and CMP   dexamethasone 2 MG tablet Commonly known as:  DECADRON Take 1 tablet (2 mg total) by mouth daily. What changed:    medication strength  how much to take  when to take this   divalproex 500 MG 24 hr tablet Commonly known as:  DEPAKOTE ER Take 500 mg by mouth at bedtime.   magnesium oxide 400 (241.3 Mg) MG tablet Commonly known as:  MAG-OX Take 1 tablet (400 mg total) by mouth 2 (two) times daily.   ondansetron 8 MG tablet Commonly known as:  ZOFRAN Take 1 tablet (8 mg total) by mouth every 8 (eight) hours as needed for nausea.   prochlorperazine 10 MG tablet Commonly known as:  COMPAZINE Take 1 tablet (10 mg total) by mouth every 6 (six) hours as needed for nausea or vomiting.   risperiDONE 0.5 MG tablet Commonly known as:  RISPERDAL Take 1 tablet (0.5 mg total) by mouth daily. What changed:    medication strength  how much to take            Home Infusion Instuctions  (From admission, onward)        Start     Ordered   08/30/17 0000  Home infusion instructions [facility] May follow [place] Dosing Protocol; May administer Cathflo as needed to maintain patency of vascular access device.; Flushing of vascular access device: per [facility] Protocol: 0.9% NaCl pre/post medica...    Question Answer Comment  Instructions May follow [place] Dosing Protocol   Instructions May administer Cathflo as needed to maintain patency of vascular access device.   Instructions Flushing of vascular access device: per [facility] Protocol: 0.9% NaCl pre/post medication  administration and prn patency; Heparin 100 u/ml, 49ml for implanted ports and Heparin 10u/ml, 75ml for all other central venous catheters.   Instructions May follow [facility] Anaphylaxis Protocol for First Dose Administration in the home: 0.9% NaCl at 25-50 ml/hr to maintain IV access for protocol meds. Epinephrine 0.3 ml IV/IM PRN and Benadryl 25-50 IV/IM PRN s/s of anaphylaxis.   Instructions [facility] Infusion Coordinator (RN) to assist per patient IV care needs in the home PRN.      08/30/17 1205      Contact information for follow-up providers    Emily Ebbs, MD. Schedule an appointment as soon as possible for a visit in 1 week(s).   Specialty:  Internal Medicine Contact information: [place] [address] [phone]        Heath Lark, MD. Go on 09/11/2017.   Specialty:  Hematology and Oncology Contact information: [place] [address] [place], Lavell Islam, MD. Schedule an appointment as soon as possible for a visit in 3 week(s).   Specialty:  Infectious Diseases Contact information: [address] [place] [address] [phone]            Contact information for after-discharge care    Destination    [facility] .   Service:  Skilled Nursing Contact information: [address] [place] [phone]                 No Known Allergies  Consultations:  Infectious disease  IR  Ophthalmology  Oncology  Procedures/Studies: Dg Chest 2 View  Result Date: 08/17/2017 CLINICAL DATA:  Hypotension today. Undergoing chemotherapy for endometrial cancer. EXAM: CHEST - 2 VIEW COMPARISON:  08/11/2017 FINDINGS: Right IJ Port-A-Cath is present with tip over the right atrium unchanged. Patient is rotated to the left. Lungs are adequately inflated with hazy opacification over the right mid to lower lung slightly worse  compatible with moderate size effusion with associated atelectasis. Slight worsened left base opacification compatible with small effusion with atelectasis. Cardiomediastinal silhouette and remainder the exam is unchanged. IMPRESSION: Worsening moderate size right effusion and worsening small left effusion likely with associated bibasilar atelectasis. Right IJ Port-A-Cath unchanged with tip over the right atrium. Electronically Signed   By: Marin Olp M.D.   On: 08/17/2017 16:06   Ct Chest W Contrast  Result Date: 08/27/2017 CLINICAL DATA:  Endometrial adenocarcinoma. Elevated tumor markers. Progressive weight loss. Undergoing chemotherapy. EXAM: CT CHEST, ABDOMEN, AND PELVIS WITH CONTRAST TECHNIQUE: Multidetector CT imaging of the chest, abdomen and pelvis was performed following the standard protocol during bolus administration of intravenous contrast. CONTRAST:  166mL OMNIPAQUE IOHEXOL 300 MG/ML SOLN, 46mL OMNIPAQUE IOHEXOL 300 MG/ML SOLN COMPARISON:  06/20/2017 FINDINGS: CT CHEST FINDINGS Cardiovascular: No acute findings. Mediastinum/Lymph Nodes: No masses or pathologically enlarged lymph nodes identified. Lungs/Pleura: Increased size of moderate right and small left pleural effusions since previous study. Increased compressive atelectasis in both lower lobes. A few scattered sub-cm  pulmonary nodules in both upper lobes remain stable. No new or enlarging pulmonary nodules or masses identified. Musculoskeletal:  No suspicious bone lesions identified. CT ABDOMEN AND PELVIS FINDINGS Hepatobiliary: New approximately 1 cm low-attenuation lesions are seen along the capsular surface the liver dome, suspicious for capsular tumor implants. Subcapsular cyst in right hepatic lobe is stable. Multiple calcified gallstones are seen, without evidence of cholecystitis or biliary ductal dilatation. Mild periportal edema is again seen. Pancreas:  No mass or inflammatory changes. Spleen:  Within normal limits in size and  appearance. Adrenals/Urinary tract:  No masses or hydronephrosis. Stomach/Bowel: No evidence of obstruction, inflammatory process, or abnormal fluid collections. Vascular/Lymphatic: No pathologically enlarged lymph nodes identified. No abdominal aortic aneurysm. 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 Reproductive: Heterogeneous soft tissue mass involving the uterine body and cervix shows no significant change in size since previous study. This measures 6.2 x 4.0 cm on image 105/2, compared to 6.1 x 4.3 cm previously. Deep myometrial invasion is seen, with probable adjacent extra uterine extension. Adnexal regions are unremarkable. Other: Mild ascites is increased since previous study. Soft tissue thickening and nodularity involving the omental fat appears stable, consistent with peritoneal carcinoma. Increased diffuse body wall edema also noted. Musculoskeletal:  No suspicious bone lesions identified. IMPRESSION: Increased size of two 1 cm low-attenuation lesions along the capsular surface of the liver dome, suspicious for peritoneal carcinoma. Other areas of soft tissue density in the omental fat are unchanged, consistent with peritoneal carcinomatosis. No significant change in soft tissue mass in the lower uterine segment and cervix, with probable adjacent extra-uterine extension. Increased bilateral pleural effusions, mild ascites, and diffuse body wall edema. Stable tiny indeterminate sub-cm bilateral upper lobe pulmonary nodules. Recommend continued follow-up by chest CT in 6 months. 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 Cholelithiasis.  No radiographic evidence of cholecystitis. Electronically Signed   By: Earle Gell M.D.   On: 08/27/2017 11:51   Ct Abdomen Pelvis W Contrast  Result Date: 08/27/2017 CLINICAL DATA:  Endometrial adenocarcinoma. Elevated tumor markers. Progressive weight loss. Undergoing chemotherapy. EXAM: CT CHEST, ABDOMEN, AND PELVIS WITH  CONTRAST TECHNIQUE: Multidetector CT imaging of the chest, abdomen and pelvis was performed following the standard protocol during bolus administration of intravenous contrast. CONTRAST:  131mL OMNIPAQUE IOHEXOL 300 MG/ML SOLN, 51mL OMNIPAQUE IOHEXOL 300 MG/ML SOLN COMPARISON:  06/20/2017 FINDINGS: CT CHEST FINDINGS Cardiovascular: No acute findings. Mediastinum/Lymph Nodes: No masses or pathologically enlarged lymph nodes identified. Lungs/Pleura: Increased size of moderate right and small left pleural effusions since previous study. Increased compressive atelectasis in both lower lobes. A few scattered sub-cm pulmonary nodules in both upper lobes remain stable. No new or enlarging pulmonary nodules or masses identified. Musculoskeletal:  No suspicious bone lesions identified. CT ABDOMEN AND PELVIS FINDINGS Hepatobiliary: New approximately 1 cm low-attenuation lesions are seen along the capsular surface the liver dome, suspicious for capsular tumor implants. Subcapsular cyst in right hepatic lobe is stable. Multiple calcified gallstones are seen, without evidence of cholecystitis or biliary ductal dilatation. Mild periportal edema is again seen. Pancreas:  No mass or inflammatory changes. Spleen:  Within normal limits in size and appearance. Adrenals/Urinary tract:  No masses or hydronephrosis. Stomach/Bowel: No evidence of obstruction, inflammatory process, or abnormal fluid collections. Vascular/Lymphatic: No pathologically enlarged lymph nodes identified. No abdominal aortic aneurysm. 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 Reproductive: Heterogeneous soft tissue mass involving the uterine body and cervix shows no significant change in  size since previous study. This measures 6.2 x 4.0 cm on image 105/2, compared to 6.1 x 4.3 cm previously. Deep myometrial invasion is seen, with probable adjacent extra uterine extension. Adnexal regions are unremarkable. Other: Mild ascites is increased since  previous study. Soft tissue thickening and nodularity involving the omental fat appears stable, consistent with peritoneal carcinoma. Increased diffuse body wall edema also noted. Musculoskeletal:  No suspicious bone lesions identified. IMPRESSION: Increased size of two 1 cm low-attenuation lesions along the capsular surface of the liver dome, suspicious for peritoneal carcinoma. Other areas of soft tissue density in the omental fat are unchanged, consistent with peritoneal carcinomatosis. No significant change in soft tissue mass in the lower uterine segment and cervix, with probable adjacent extra-uterine extension. Increased bilateral pleural effusions, mild ascites, and diffuse body wall edema. Stable tiny indeterminate sub-cm bilateral upper lobe pulmonary nodules. Recommend continued follow-up by chest CT in 6 months. 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 Cholelithiasis.  No radiographic evidence of cholecystitis. Electronically Signed   By: Earle Gell M.D.   On: 08/27/2017 11:51   Ir Removal [employer] W/ [place] W/o Fl Mod Sed  Result Date: 08/27/2017 INDICATION: 67 year old female with a history port catheter removal secondary to fungemia EXAM: REMOVAL RIGHT IJ VEIN PORT-A-CATH MEDICATIONS: 2.0 g Ancef; The antibiotic was administered within an appropriate time interval prior to skin puncture. ANESTHESIA/SEDATION: Moderate (conscious) sedation was employed during this procedure. A total of Versed 1.0 mg and Fentanyl 50 mcg was administered intravenously. Moderate Sedation Time: 10 minutes. The patient's level of consciousness and vital signs were monitored continuously by radiology nursing throughout the procedure under my direct supervision. FLUOROSCOPY TIME:  Fluoroscopy none COMPLICATIONS: None PROCEDURE: Informed consent was obtained from the patient following an explanation of the procedure, risks, benefits and alternatives. The patient understands, agrees and consents for the  procedure. All questions were addressed. A time out was performed prior to the initiation of the procedure. The patient was positioned in the operation suite in the supine position on a gantry. The previous scar on the right chest was generously infiltrated with 1% lidocaine for local anesthesia. Infiltration of the skin and subcutaneous tissues surrounding the port was performed. Using sharp and blunt dissection, the port apparatus and subcutaneous catheter were removed in their entirety. The port pocket was then closed with interrupted Vicryl layer and a running subcuticular with 4-0 Monocryl. The skin was sealed with Derma bond. A sterile dressing was placed. The patient tolerated the procedure well and remained hemodynamically stable throughout. No complications were encountered and no significant blood loss was encountered. IMPRESSION: Status post removal of right IJ port catheter. Signed, Dulcy Fanny. Earleen Newport, DO [facility] Electronically Signed   By: Corrie Mckusick D.O.   On: 08/27/2017 09:08   Dg Chest Port 1 View  Result Date: 08/11/2017 CLINICAL DATA:  Generalized weakness, shortness of breath. EXAM: PORTABLE CHEST 1 VIEW COMPARISON:  Radiograph of May 05, 2017. FINDINGS: The heart size and mediastinal contours are within normal limits. No pneumothorax is noted. Left lung is clear. Right internal jugular Port-A-Cath is unchanged in position. Mild right basilar atelectasis is noted with associated pleural effusion. The visualized skeletal structures are unremarkable. IMPRESSION: Mild right basilar subsegmental atelectasis is noted with associated pleural effusion. Electronically Signed   By: Marijo Conception, M.D.   On: 08/11/2017 14:00   Dg Abd Portable 1v  Result Date: 08/17/2017 CLINICAL DATA:  Abdominal distention.  EXAM: PORTABLE ABDOMEN - 1 VIEW COMPARISON:  CT abdomen pelvis dated June 20, 2017. FINDINGS: The bowel gas pattern is normal.  Moderate colonic stool burden, unchanged. No radio-opaque calculi or other significant radiographic abnormality are seen. Multiple phleboliths in the pelvis. No acute osseous abnormality. IMPRESSION: 1. Prominent stool throughout the colon favors constipation. No obstruction. Electronically Signed   By: Titus Duarte M.D.   On: 08/17/2017 17:44   [place] Ekg Site Rite  Result Date: 08/30/2017 If Site Rite image not attached, placement could not be confirmed due to current cardiac rhythm.      Discharge Exam: Vitals:   08/30/17 1923 08/31/17 0402  BP: 106/73 131/83  Pulse: (!) 118 (!) 112  Resp: 20 20  Temp: 98.1 F (36.7 C) 98.2 F (36.8 C)  SpO2: 97% 95%    General: Pt is alert, awake, not in acute distress Cardiovascular: regular rhythm, tachycardic rate low 100s, S1/S2 +, no rubs, no gallops Respiratory: CTA bilaterally, no wheezing, no rhonchi Abdominal: Soft, NT, ND, bowel sounds + Extremities: no edema, no cyanosis    The results of significant diagnostics from this hospitalization (including imaging, microbiology, ancillary and laboratory) are listed below for reference.     Microbiology: Recent Results (from the past 240 hour(s))  Blood culture (routine x 2)     Status: None   Collection Time: 08/22/17  1:00 PM  Result Value Ref Range Status   Specimen Description   Final    BLOOD LEFT ANTECUBITAL Performed at [facility] [address] [place], [place] [address]    Special Requests   Final    BOTTLES DRAWN AEROBIC AND ANAEROBIC Blood Culture adequate volume Performed at [facility] [address] [place], [place] [address]    Culture   Final    NO GROWTH 5 DAYS Performed at [facility], [place] [address], [place], [place] [address]    Report Status 08/27/2017 FINAL  Final  Blood culture (routine x 2)     Status: None   Collection Time: 08/22/17  1:13 PM  Result Value Ref Range Status   Specimen Description    Final    BLOOD RIGHT ANTECUBITAL Performed at [facility] [address], [place], [place] [address]    Special Requests   Final    BOTTLES DRAWN AEROBIC AND ANAEROBIC Blood Culture adequate volume Performed at [facility] [address], [place], [place] [address]    Culture   Final    NO GROWTH 5 DAYS Performed at [facility], [place] [address], [place], [place] [address]    Report Status 08/27/2017 FINAL  Final  Culture, blood (routine x 2)     Status: None (Preliminary result)   Collection Time: 08/28/17  9:37 AM  Result Value Ref Range Status   Specimen Description   Final    BLOOD LEFT ARM Performed at [facility] [address], [place], [place] [address]    Special Requests   Final    BOTTLES DRAWN AEROBIC AND ANAEROBIC Blood Culture adequate volume Performed at [facility] [address] [place], [place] [address]    Culture   Final    NO GROWTH 2 DAYS Performed at [facility] [address] [place], [place] [address]    Report Status PENDING  Incomplete  Culture, blood (routine x 2)     Status: None (Preliminary result)   Collection  Time: 08/28/17  9:43 AM  Result Value Ref Range Status   Specimen Description   Final    BLOOD RIGHT WRIST Performed at [facility] [address], [place], [place] [address]    Special Requests   Final    AEROBIC BOTTLE ONLY Blood Culture adequate volume Performed at [facility] [address] [place], [place] [address]    Culture   Final    NO GROWTH 2 DAYS Performed at [facility] [address] [place], [place] [address]    Report Status PENDING  Incomplete     Labs: BNP (last 3 results) No results for input(s): BNP in the last 8760 hours. Basic Metabolic Panel: Recent Labs  Lab 08/27/17 0434 08/28/17 0446 08/29/17 0429 08/30/17 0348 08/31/17 0342  NA 138 136 139 136  136  K 3.9 4.3 4.4 4.3 4.4  CL 97* 99* 99* 97* 97*  CO2 28 26 26 26 27   GLUCOSE 106* 108* 125* 95 91  BUN 31* 32* 34* 35* 38*  CREATININE 1.20* 1.28* 1.49* 1.58* 1.74*  CALCIUM 8.4* 8.4* 8.5* 8.5* 8.5*   Liver Function Tests: No results for input(s): AST, ALT, ALKPHOS, BILITOT, PROT, ALBUMIN in the last 168 hours. No results for input(s): LIPASE, AMYLASE in the last 168 hours. No results for input(s): AMMONIA in the last 168 hours. CBC: Recent Labs  Lab 08/27/17 0434 08/28/17 0446 08/28/17 1714 08/29/17 0429 08/30/17 0348 08/31/17 0342  WBC 19.4* 19.9*  --  21.1* 20.6* 21.4*  HGB 9.2* 9.1* 9.3* 9.0* 9.5* 9.5*  HCT 28.9* 28.9* 29.3* 28.9* 30.3* 29.7*  MCV 91.5 91.2  --  91.2 92.1 90.5  PLT 330 328  --  351 316 312   Cardiac Enzymes: No results for input(s): CKTOTAL, CKMB, CKMBINDEX, TROPONINI in the last 168 hours. BNP: Invalid input(s): POCBNP CBG: No results for input(s): GLUCAP in the last 168 hours. D-Dimer No results for input(s): DDIMER in the last 72 hours. Hgb A1c No results for input(s): HGBA1C in the last 72 hours. Lipid Profile No results for input(s): CHOL, HDL, LDLCALC, TRIG, CHOLHDL, LDLDIRECT in the last 72 hours. Thyroid function studies No results for input(s): TSH, T4TOTAL, T3FREE, THYROIDAB in the last 72 hours.  Invalid input(s): FREET3 Anemia work up No results for input(s): VITAMINB12, FOLATE, FERRITIN, TIBC, IRON, RETICCTPCT in the last 72 hours. Urinalysis    Component Value Date/Time   COLORURINE YELLOW 08/17/2017 1700   APPEARANCEUR CLEAR 08/17/2017 1700   LABSPEC 1.014 08/17/2017 1700   PHURINE 5.0 08/17/2017 1700   GLUCOSEU NEGATIVE 08/17/2017 1700   HGBUR NEGATIVE 08/17/2017 1700   BILIRUBINUR NEGATIVE 08/17/2017 1700   KETONESUR NEGATIVE 08/17/2017 1700   PROTEINUR NEGATIVE 08/17/2017 1700   UROBILINOGEN 0.2 07/12/2009 1846   NITRITE NEGATIVE 08/17/2017 1700   LEUKOCYTESUR NEGATIVE 08/17/2017 1700   Sepsis Labs Invalid  input(s): PROCALCITONIN,  WBC,  LACTICIDVEN Microbiology Recent Results (from the past 240 hour(s))  Blood culture (routine x 2)     Status: None   Collection Time: 08/22/17  1:00 PM  Result Value Ref Range Status   Specimen Description   Final    BLOOD LEFT ANTECUBITAL Performed at [facility], [place] [address] [place], [place] [address]    Special Requests   Final    BOTTLES DRAWN AEROBIC AND ANAEROBIC Blood Culture adequate volume Performed at [facility] [address] [place],  [address]    Culture  Final    NO GROWTH 5 DAYS Performed at [facility], [place] [address], [place], [place] [address]    Report Status 08/27/2017 FINAL  Final  Blood culture (routine x 2)     Status: None   Collection Time: 08/22/17  1:13 PM  Result Value Ref Range Status   Specimen Description   Final    BLOOD RIGHT ANTECUBITAL Performed at [facility] [address], [place], [place] [address]    Special Requests   Final    BOTTLES DRAWN AEROBIC AND ANAEROBIC Blood Culture adequate volume Performed at [facility] [address] [place], [place] [address]    Culture   Final    NO GROWTH 5 DAYS Performed at [facility], [place] [address] [place], [place] [address]    Report Status 08/27/2017 FINAL  Final  Culture, blood (routine x 2)     Status: None (Preliminary result)   Collection Time: 08/28/17  9:37 AM  Result Value Ref Range Status   Specimen Description   Final    BLOOD LEFT ARM Performed at [facility] [address], [place], [place] [address]    Special Requests   Final    BOTTLES DRAWN AEROBIC AND ANAEROBIC Blood Culture adequate volume Performed at [facility] [address], [place], [place] [address]    Culture   Final    NO GROWTH 2 DAYS Performed at [facility] [address] [place], [place] [address]    Report Status  PENDING  Incomplete  Culture, blood (routine x 2)     Status: None (Preliminary result)   Collection Time: 08/28/17  9:43 AM  Result Value Ref Range Status   Specimen Description   Final    BLOOD RIGHT WRIST Performed at [facility] [address], [place], [place] [address]    Special Requests   Final    AEROBIC BOTTLE ONLY Blood Culture adequate volume Performed at [facility] [address] [place], [place] [address]    Culture   Final    NO GROWTH 2 DAYS Performed at [facility] [address] [place], [place] [address]    Report Status PENDING  Incomplete     Patient was seen and examined on the day of discharge and was found to be in stable condition. Time coordinating discharge: 40 minutes including assessment and coordination of care, as well as examination of the patient.   SIGNED:  Dessa Phi, DO [facility] Pager [phone]  If 7PM-7AM, please contact night-coverage [email or web address] Password [email or web address] 08/31/2017, 8:20 AM

## 2017-08-30 NOTE — Progress Notes (Signed)
Clinical Social Worker following patient for support and discharge need. Patients RN reached out to [facility] and stated that patient is still waiting to get her picc placed. CSW reached out to admission coordinator(Michelle) at [facility]. Sharyn Lull stated that because it is starting to get late they will be unable to take patient today. Sharyn Lull stated they will be able to take patient tomorrow morning.     Rhea Pink, MSW,  [place]

## 2017-08-31 LAB — CBC
HEMATOCRIT: 29.7 % — AB (ref 36.0–46.0)
HEMOGLOBIN: 9.5 g/dL — AB (ref 12.0–15.0)
MCH: 29 pg (ref 26.0–34.0)
MCHC: 32 g/dL (ref 30.0–36.0)
MCV: 90.5 fL (ref 78.0–100.0)
Platelets: 312 10*3/uL (ref 150–400)
RBC: 3.28 MIL/uL — AB (ref 3.87–5.11)
RDW: 18.1 % — AB (ref 11.5–15.5)
WBC: 21.4 10*3/uL — ABNORMAL HIGH (ref 4.0–10.5)

## 2017-08-31 LAB — BASIC METABOLIC PANEL
Anion gap: 12 (ref 5–15)
BUN: 38 mg/dL — AB (ref 6–20)
CALCIUM: 8.5 mg/dL — AB (ref 8.9–10.3)
CHLORIDE: 97 mmol/L — AB (ref 101–111)
CO2: 27 mmol/L (ref 22–32)
CREATININE: 1.74 mg/dL — AB (ref 0.44–1.00)
GFR calc Af Amer: 34 mL/min — ABNORMAL LOW (ref >60)
GFR calc non Af Amer: 29 mL/min — ABNORMAL LOW (ref >60)
GLUCOSE: 91 mg/dL (ref 65–99)
Potassium: 4.4 mmol/L (ref 3.5–5.1)
Sodium: 136 mmol/L (ref 135–145)

## 2017-08-31 MED ORDER — HEPARIN SOD (PORK) LOCK FLUSH 100 UNIT/ML IV SOLN
500.0000 [IU] | Freq: Once | INTRAVENOUS | Status: DC
  Filled 2017-08-31: qty 5

## 2017-08-31 MED ORDER — SODIUM CHLORIDE 0.9 % IV BOLUS
1000.0000 mL | Freq: Once | INTRAVENOUS | Status: AC
  Administered 2017-08-31: 1000 mL via INTRAVENOUS

## 2017-08-31 NOTE — Progress Notes (Signed)
  PROGRESS NOTE  Patient received PICC line yesterday evening. Could not discharge to SNF to due timing. Repeat Cr is slightly up at at 1.74 (baseline ranged previously 1.1-1.5). Urine output remains stable. Will give IVF prior to discharge today. Encourage oral intake to keep hydrated and repeat BMP as outpatient. DC summary updated.   Dessa Phi, DO [facility] [email or web address] Password [email or web address] 08/31/2017, 8:22 AM

## 2017-08-31 NOTE — Progress Notes (Signed)
Patient will discharge to [facility] Anticipated discharge date: 5/12 Transportation by [facility]- scheduled for 2pm  CSW signing off.  Jorge Ny, LCSW Clinical Social Worker [phone]

## 2017-08-31 NOTE — Progress Notes (Signed)
Report called to Seth Bake at [facility].

## 2017-09-01 ENCOUNTER — Encounter: Payer: Self-pay | Admitting: Adult Health

## 2017-09-01 ENCOUNTER — Non-Acute Institutional Stay (SKILLED_NURSING_FACILITY): Payer: Medicare Other | Admitting: Adult Health

## 2017-09-01 DIAGNOSIS — C541 Malignant neoplasm of endometrium: Secondary | ICD-10-CM | POA: Diagnosis not present

## 2017-09-01 DIAGNOSIS — N183 Chronic kidney disease, stage 3 unspecified: Secondary | ICD-10-CM

## 2017-09-01 DIAGNOSIS — R112 Nausea with vomiting, unspecified: Secondary | ICD-10-CM | POA: Diagnosis not present

## 2017-09-01 DIAGNOSIS — I82412 Acute embolism and thrombosis of left femoral vein: Secondary | ICD-10-CM

## 2017-09-01 DIAGNOSIS — E43 Unspecified severe protein-calorie malnutrition: Secondary | ICD-10-CM | POA: Diagnosis not present

## 2017-09-01 DIAGNOSIS — F251 Schizoaffective disorder, depressive type: Secondary | ICD-10-CM

## 2017-09-01 DIAGNOSIS — J90 Pleural effusion, not elsewhere classified: Secondary | ICD-10-CM | POA: Diagnosis not present

## 2017-09-01 NOTE — Progress Notes (Signed)
Location:   [facility] Room Number: 116 A Place of Service:  SNF (31)   CODE STATUS: Full Code  No Known Allergies  Chief Complaint  Patient presents with  . Hospitalization Follow-up    Hospital Follow up    HPI:  She is a 67 year old woman with a history of endometrial cancer; hypertension; schizophrenia; dvt of left leg. She had been hospitalized from 08-17-17 through 08-20-17 due to hypotension related to adrenal insufficiency. Over the past 2 days prior to her hospitalization she had developed increased weakness. Her blood culture on 08-17-17 grew Candida Glabrata. Infectious disease was consulted she was started on Eraxis. Her portacath was removed on 08-26-17. She will need 2 weeks of antifungal therapy. She is here for short term rehab with her goal to return back home. I am not certain at this time if this will be a possibility. There are no nursing concerns at this time. She denies any nausea; vomiting; she does have weakness and fatigue. She will continue to be followed for her chronic illnesses including: dvt; anemia; schizophrenia.   Past Medical History:  Diagnosis Date  . Endometrial cancer ([place])   . Hemorrhoid   . Hypertension   . Paranoid schizophrenia ([place])   . Snake bite     Past Surgical History:  Procedure Laterality Date  . ENDOMETRIAL BIOPSY  08/11/2016  . IR FLUORO GUIDE PORT INSERTION RIGHT  09/05/2016  . IR REMOVAL TUN ACCESS W/ PORT W/O FL MOD SED  08/26/2017  . IR US GUIDE VASC ACCESS RIGHT  09/05/2016  . TUBAL LIGATION      Social History   Socioeconomic History  . Marital status: Divorced    Spouse name: Not on file  . Number of children: 1  . Years of education: Not on file  . Highest education level: Not on file  Occupational History  . Occupation: Retired  Scientific laboratory technician  . Financial resource strain: Not on file  . Food insecurity:    Worry: Not on file    Inability: Not on file  . Transportation needs:    Medical: Not on  file    Non-medical: Not on file  Tobacco Use  . Smoking status: Never Smoker  . Smokeless tobacco: Never Used  Substance and Sexual Activity  . Alcohol use: No  . Drug use: No  . Sexual activity: Never  Lifestyle  . Physical activity:    Days per week: Not on file    Minutes per session: Not on file  . Stress: Not on file  Relationships  . Social connections:    Talks on phone: Not on file    Gets together: Not on file    Attends religious service: Not on file    Active member of club or organization: Not on file    Attends meetings of clubs or organizations: Not on file    Relationship status: Not on file  . Intimate partner violence:    Fear of current or ex partner: Not on file    Emotionally abused: Not on file    Physically abused: Not on file    Forced sexual activity: Not on file  Other Topics Concern  . Not on file  Social History Narrative  . Not on file   Family History  Problem Relation Age of Onset  . Colon cancer Mother        late 79s  . Schizophrenia Sister   . Colon cancer Sister  late 50 s      VITAL SIGNS BP 106/62   Pulse 98   Temp 97.7 F (36.5 C)   Resp 18   Ht 5\' 4"  (1.626 m)   Wt 158 lb 9.6 oz (71.9 kg)   SpO2 95%   BMI 27.22 kg/m   Outpatient Encounter Medications as of 09/01/2017  Medication Sig  . anidulafungin (ERAXIS) IVPB Inject 100 mg into the vein daily for 11 days. Indication: fungemia Last Day of Therapy: 09/10/2017 Labs - Once weekly:  CBC/D and CMP  . dexamethasone (DECADRON) 2 MG tablet Take 1 tablet (2 mg total) by mouth daily.  . divalproex (DEPAKOTE ER) 500 MG 24 hr tablet Take 500 mg by mouth at bedtime.  . magnesium oxide (MAG-OX) 400 (241.3 Mg) MG tablet Take 1 tablet (400 mg total) by mouth 2 (two) times daily.  . ondansetron (ZOFRAN) 8 MG tablet Take 1 tablet (8 mg total) by mouth every 8 (eight) hours as needed for nausea.  . prochlorperazine (COMPAZINE) 10 MG tablet Take 1 tablet (10 mg total) by mouth  every 6 (six) hours as needed for nausea or vomiting.  . risperiDONE (RISPERDAL) 0.5 MG tablet Take 1 tablet (0.5 mg total) by mouth daily.   No facility-administered encounter medications on file as of 09/01/2017.      SIGNIFICANT DIAGNOSTIC EXAMS  TODAY:   06-20-17: ct of chest abdomen and pelvis: 1. Slightly improved omental disease. Scattered peritoneal surface disease and mesenteric disease appears relatively stable. 2. New cystic lesion peripherally in the right hepatic lobe could be treated peritoneal surface disease. 3. Persistent extensive endometrial neoplasm involving the lower uterine segment and cervix. 4. Cholelithiasis. 5. Moderate-sized right effusion with overlying atelectasis but no enhancing pleural nodules or pulmonary nodules. 6. Deep venous thrombosis in the left common femoral vein.  08-11-17: chest x-ray: Mild right basilar subsegmental atelectasis is noted with associated pleural effusion.  08-17-17: chest x-ray: Worsening moderate size right effusion and worsening small left effusion likely with associated bibasilar atelectasis. Right IJ Port-A-Cath unchanged with tip over the right atrium.   08-17-17: kub: . Prominent stool throughout the colon favors constipation. No obstruction.   LABS REVIEWED TODAY:   08-17-17: wbc 12.1; hgb 9.1; hct 28,7 mcv 90.0 plt 324; glucose 100; bun 31; creat 1.39; k+ 3.7; na++ 138; ca 8.6; liver normal albumin 1.8; blood and urine culture: no growth 08-18-17: cortisol 4.8 (low) 08-19-17: wbc 15.5; hgb 7.0; hct 21.7; mcv 91.2; plt 306; glucose 107; bun 31; creat 1.15; k+ 4.3; na++ 141; ca 7.9; mag 1.4 08-20-17: wbc 17.0; hgb 10.1; hct 30.5; mcv 87.1 ;plt 257; glucose 95; bun 33; creat 1.17; k+ 4.8; na++ 138; ca 8.0 08-22-17: wbc 18.4; hgb 10.2; hct 31.4; mcv 89.0; plt 269; glucose 111; bun 30; creat 1.07; k+ 4.3; na++ 137; ca 8.2; depakote 14; blood culture: no growth 08-23-17: wbc 17.9; hgb 9.5; hct 30.1; mcv 90.1 ;plt 278; glucose 93; bun 30;  creat 1.12; k+ 4.5; na++ 135; ca 8.5; liver normal albumin 1.6;  08-27-17: CA 125: 687.0;  08-28-17: blood culture: no growth 08-30-17: wbc 20.6; hgb 9.5; hct 30.3; mcv 92.1; plt 316; glucose 95; bun 35; creat 1.58; k+ 4.3; an++ 136; ca 8.5 08-31-17: wbc 21.4; hgb 9.5; hct 29.7; mcv 90.5; plt 312; glucose 91; bun 38; creat 1.74; k+ 4.4; an++ 136; ca 8.5     Review of Systems  Constitutional: Positive for malaise/fatigue.  Respiratory: Negative for cough and shortness of breath.  Cardiovascular: Negative for chest pain, palpitations and leg swelling.  Gastrointestinal: Negative for abdominal pain, constipation and heartburn.  Musculoskeletal: Negative for back pain, joint pain and myalgias.  Skin: Negative.   Neurological: Negative for dizziness.  Psychiatric/Behavioral: The patient is not nervous/anxious.     Physical Exam  Constitutional: She appears well-developed and well-nourished. No distress.  HENT:  Is bald scalp skin is dry and flaky   Neck: No thyromegaly present.  Cardiovascular: Normal rate, regular rhythm, normal heart sounds and intact distal pulses.  Pulmonary/Chest: Effort normal and breath sounds normal. No stridor. No respiratory distress.  Abdominal: Soft. Bowel sounds are normal. She exhibits no distension. There is no tenderness.  Musculoskeletal: She exhibits no edema.  Is able to move all extremities Is extremely weak   Lymphadenopathy:    She has no cervical adenopathy.  Neurological: She is alert.  Skin: Skin is warm and dry. She is not diaphoretic.  Right chest wall portacath site   Has skin discoloration on chest wall   Psychiatric: She has a normal mood and affect.    ASSESSMENT/ PLAN:  TODAY:  1. Left femoral vein DVT: stable will continue xarelto 20 mg daily   2. Schizoaffective disorder; depressive type: is presently stable will continue risperdal 1 mg daily and depakote ER 500 mg nightly   3. Hypomagnesemia: mag level 1.4; will continue mag ox  400 mg twice daily and will check level  4. Hypokalemia: k+ 4.8 stable will continue k+ 10 meq daily  5. Nausea and vomiting:has adrenal insuffiencey:  is followed by oncology; will continue decadron 4 mg twice daily    6. Cancer associated pain: is being managed will continue vicodin 10/325 mg every 4 hours as needed  7. Protein calorie malnutrition, severe: weight is 158 pounds; albumin is 1.8; will continue supplements as directed  8.  Endometrial/uterine adenocarcinoma: stage III (cT3, Cn1a): is being followed by oncology; is on chemotherapy as directed; does have some vaginal bleeding present. Will monitor  9. Anemia of chronic disease: stable hgb 10.1: is status post 2 u PRBCs transfusion. Will monitor  10. SIRS: is stable possibly due to adrenal insufficiency: will continue decadron 4 mg twice daily   11. Leukocytosis: without change wbc 17.0; will monitor  12. CKD stage 3: stable bun 33 creat 1.17  13. Chronic bilateral pleural effusion: is stable without symptoms will monitor   Will check cbc; bmp mag level.   MD is aware of resident's narcotic use and is in agreement with current plan of care. We will attempt to wean resident as apropriate   Ok Edwards NP [facility]  Contact [phone] Monday through Friday 8am- 5pm  After hours call [phone]

## 2017-09-02 ENCOUNTER — Telehealth: Payer: Self-pay

## 2017-09-02 LAB — CULTURE, BLOOD (ROUTINE X 2)
Culture: NO GROWTH
Culture: NO GROWTH
SPECIAL REQUESTS: ADEQUATE
SPECIAL REQUESTS: ADEQUATE

## 2017-09-02 NOTE — Telephone Encounter (Signed)
Verified appointment date and time. [facility] [place]. Per 5/14 return messages

## 2017-09-04 ENCOUNTER — Non-Acute Institutional Stay (SKILLED_NURSING_FACILITY): Payer: Medicare Other | Admitting: Internal Medicine

## 2017-09-04 ENCOUNTER — Encounter: Payer: Self-pay | Admitting: Internal Medicine

## 2017-09-04 DIAGNOSIS — D638 Anemia in other chronic diseases classified elsewhere: Secondary | ICD-10-CM

## 2017-09-04 DIAGNOSIS — J90 Pleural effusion, not elsewhere classified: Secondary | ICD-10-CM | POA: Diagnosis not present

## 2017-09-04 DIAGNOSIS — F251 Schizoaffective disorder, depressive type: Secondary | ICD-10-CM

## 2017-09-04 DIAGNOSIS — N183 Chronic kidney disease, stage 3 unspecified: Secondary | ICD-10-CM

## 2017-09-04 DIAGNOSIS — E2740 Unspecified adrenocortical insufficiency: Secondary | ICD-10-CM

## 2017-09-04 DIAGNOSIS — D72829 Elevated white blood cell count, unspecified: Secondary | ICD-10-CM

## 2017-09-04 DIAGNOSIS — G893 Neoplasm related pain (acute) (chronic): Secondary | ICD-10-CM | POA: Diagnosis not present

## 2017-09-04 DIAGNOSIS — R5381 Other malaise: Secondary | ICD-10-CM | POA: Diagnosis not present

## 2017-09-04 DIAGNOSIS — I82412 Acute embolism and thrombosis of left femoral vein: Secondary | ICD-10-CM

## 2017-09-04 DIAGNOSIS — C541 Malignant neoplasm of endometrium: Secondary | ICD-10-CM | POA: Diagnosis not present

## 2017-09-04 DIAGNOSIS — E878 Other disorders of electrolyte and fluid balance, not elsewhere classified: Secondary | ICD-10-CM

## 2017-09-04 DIAGNOSIS — B49 Unspecified mycosis: Secondary | ICD-10-CM | POA: Diagnosis not present

## 2017-09-04 NOTE — Progress Notes (Signed)
Patient ID: Emily Duarte, female   DOB: 03/04/1951, 67 y.o.   MRN: [id number]  Provider:  DR Arletha Grippe Location:  [facility] Room Number: 116 A Place of Service:  SNF (31)  PCP: Nolene Ebbs, MD Patient Care Team: Nolene Ebbs, MD as PCP - General (Internal Medicine)  Extended Emergency Contact Information Primary Emergency Contact: Cornelius,Frederick Address: [address]          [place], [place] [address] [place] Phone: [phone] Mobile Phone: [phone] Relation: Son  Code Status: Full Code Goals of Care: Advanced Directive information Advanced Directives 09/04/2017  Does Patient Have a Medical Advance Directive? No  Would patient like information on creating a medical advance directive? No - Patient declined  Some encounter information is confidential and restricted. Go to Review Flowsheets activity to see all data.      Chief Complaint  Patient presents with  . New Admit To SNF    Admission    HPI: Patient is a 67 y.o. female seen today for admission to SNF Following hospital stay 4/22-4/26/19 for lower UTI, protein calorie malnutrition, electrolyte disturbance, acute/CKD, uterine CA on chemotx; 4/28-08/20/17 for SIRS presumed 2/2 adrenal insufficiency, dehydration, schizoaffective d/o, schizophrenia; 5/3-5/12/19 for fungemia. Most recent admission due to (+) BC from 08/17/17 that grew Candida glabrata. ID consulted. She was tx with IV eraxis. Port-a-cath removed by IR 08/26/17. Repeat BC neg to date. PICC line placed so she could continue IV eraxis x 2 weeks (STOP DATE 09/10/17). She presents to SNF for short term rehab.  Today she reports buttock pain. She is unsure as to when her next appt with oncology is. No N/V. No f/c. Appetite reduced. She sleeps well. She is working with therapy. No falls. She is a poor historian due to psych d/o. Hx obtained from chart.  Left femoral vein DVT - stable on xarelto 20 mg daily    Schizoaffective disorder; depressive type - mood stable on risperdal 1 mg daily; depakote ER 500 mg nightly   Hypomagnesemia - Mg level 1.4; takes mag ox 400 mg twice daily  Hypokalemia - stable on k+ 10 meq daily. K 4.4    Protein calorie malnutrition, severe - albumin 1.6;she gets nutritional supplements per facility protocol  Endometrial/uterine adenocarcinoma - stage III (cT3, Cn1a) - followed by oncology; is on chemotherapy as directed; does have some vaginal bleeding present;  CA 125 level 687 (prev 955.6); she has associated N/V and takes decadron 4 mg twice daily. Pian improved on vicodin 10/325 mg every 4 hours as needed  Anemia of chronic disease - improved s/p 2Units PRBC transfusionr; Hgb 10.1  Leukocytosis - stable. WBC 21.4K  CKD - stage 3. Cr 1.74  Chronic bilateral pleural effusion - stable. asymptomatic    Past Medical History:  Diagnosis Date  . Endometrial cancer ([place])   . Hemorrhoid   . Hypertension   . Paranoid schizophrenia ([place])   . Snake bite    Past Surgical History:  Procedure Laterality Date  . ENDOMETRIAL BIOPSY  08/11/2016  . IR FLUORO GUIDE PORT INSERTION RIGHT  09/05/2016  . IR REMOVAL TUN ACCESS W/ PORT W/O FL MOD SED  08/26/2017  . IR US GUIDE VASC ACCESS RIGHT  09/05/2016  . TUBAL LIGATION      reports that she has never smoked. She has never used smokeless tobacco. She reports that she does not drink alcohol or use drugs. Social History   Socioeconomic History  . Marital  status: Divorced    Spouse name: Not on file  . Number of children: 1  . Years of education: Not on file  . Highest education level: Not on file  Occupational History  . Occupation: Retired  Scientific laboratory technician  . Financial resource strain: Not on file  . Food insecurity:    Worry: Not on file    Inability: Not on file  . Transportation needs:    Medical: Not on file    Non-medical: Not on file  Tobacco Use  . Smoking status: Never Smoker  . Smokeless tobacco: Never  Used  Substance and Sexual Activity  . Alcohol use: No  . Drug use: No  . Sexual activity: Never  Lifestyle  . Physical activity:    Days per week: Not on file    Minutes per session: Not on file  . Stress: Not on file  Relationships  . Social connections:    Talks on phone: Not on file    Gets together: Not on file    Attends religious service: Not on file    Active member of club or organization: Not on file    Attends meetings of clubs or organizations: Not on file    Relationship status: Not on file  . Intimate partner violence:    Fear of current or ex partner: Not on file    Emotionally abused: Not on file    Physically abused: Not on file    Forced sexual activity: Not on file  Other Topics Concern  . Not on file  Social History Narrative  . Not on file    Functional Status Survey:    Family History  Problem Relation Age of Onset  . Colon cancer Mother        late 29s  . Schizophrenia Sister   . Colon cancer Sister        late 63 s    Health Maintenance  Topic Date Due  . INFLUENZA VACCINE  07/05/2018 (Originally 11/20/2017)  . MAMMOGRAM  08/21/2018 (Originally 03/16/1999)  . DEXA SCAN  08/21/2018 (Originally 03/15/2014)  . COLONOSCOPY  08/21/2018 (Originally 03/16/1999)  . Hepatitis C Screening  Completed  . TETANUS/TDAP  Discontinued  . PNA vac Low Risk Adult  Discontinued    No Known Allergies  Outpatient Encounter Medications as of 09/04/2017  Medication Sig  . anidulafungin (ERAXIS) IVPB Inject 100 mg into the vein daily for 11 days. Indication: fungemia Last Day of Therapy: 09/10/2017 Labs - Once weekly:  CBC/D and CMP  . dexamethasone (DECADRON) 2 MG tablet Take 1 tablet (2 mg total) by mouth daily.  . divalproex (DEPAKOTE ER) 500 MG 24 hr tablet Take 500 mg by mouth at bedtime.  . magnesium oxide (MAG-OX) 400 (241.3 Mg) MG tablet Take 1 tablet (400 mg total) by mouth 2 (two) times daily.  . Melatonin 3 MG TABS Take 1 mg by mouth at bedtime.  .  ondansetron (ZOFRAN) 8 MG tablet Take 1 tablet (8 mg total) by mouth every 8 (eight) hours as needed for nausea.  . prochlorperazine (COMPAZINE) 10 MG tablet Take 1 tablet (10 mg total) by mouth every 6 (six) hours as needed for nausea or vomiting.  . risperiDONE (RISPERDAL) 0.5 MG tablet Take 1 tablet (0.5 mg total) by mouth daily.   No facility-administered encounter medications on file as of 09/04/2017.     Review of Systems  Unable to perform ROS: Psychiatric disorder    Vitals:   09/04/17 [place]  BP: 109/66  Pulse: (!) 114  Resp: 20  Temp: 97.9 F (36.6 C)  SpO2: 94%  Weight: 158 lb 9.6 oz (71.9 kg)  Height: 5\' 4"  (1.626 m)   Body mass index is 27.22 kg/m. Physical Exam  Constitutional: She appears well-developed.  Frail appearing sitting up in bed in NAD  HENT:  Mouth/Throat: Oropharynx is clear and moist. No oropharyngeal exudate.  MMM; no oral thrush  Eyes: Pupils are equal, round, and reactive to light. No scleral icterus.  Neck: Neck supple. Carotid bruit is not present. No tracheal deviation present. No thyromegaly present.  Cardiovascular: Regular rhythm and intact distal pulses. Tachycardia present. Exam reveals no gallop and no friction rub.  Murmur heard.  Systolic murmur is present with a grade of 1/6. Right arm PICC line intact with no redness or d/c at insertion site; no RLE edema but with trace LLE edema and calf swelling  Pulmonary/Chest: Effort normal. No stridor. No respiratory distress. She has no wheezes. She has rales (R>L base).  Abdominal: Soft. Normal appearance and bowel sounds are normal. She exhibits distension and mass. There is no hepatomegaly. There is no tenderness. There is no rigidity, no rebound and no guarding. No hernia.  Musculoskeletal: She exhibits edema.  FROM toes b/l  Lymphadenopathy:    She has no cervical adenopathy.  Neurological: She is alert. She has normal reflexes.  Skin: Skin is warm and dry. No rash noted.  Psychiatric:  She has a normal mood and affect. Her behavior is normal. Thought content normal.    Labs reviewed: Basic Metabolic Panel: Recent Labs    08/13/17 0416 08/13/17 1025  08/19/17 0550  08/29/17 0429 08/30/17 0348 08/31/17 0342  NA QUESTIONABLE RESULTS, RECOMMEND RECOLLECT TO VERIFY 137   < > 141   < > 139 136 136  K QUESTIONABLE RESULTS, RECOMMEND RECOLLECT TO VERIFY 4.1   < > 4.3   < > 4.4 4.3 4.4  CL QUESTIONABLE RESULTS, RECOMMEND RECOLLECT TO VERIFY 106   < > 106   < > 99* 97* 97*  CO2 QUESTIONABLE RESULTS, RECOMMEND RECOLLECT TO VERIFY 22   < > 25   < > 26 26 27   GLUCOSE QUESTIONABLE RESULTS, RECOMMEND RECOLLECT TO VERIFY 117*   < > 107*   < > 125* 95 91  BUN QUESTIONABLE RESULTS, RECOMMEND RECOLLECT TO VERIFY 34*   < > 31*   < > 34* 35* 38*  CREATININE QUESTIONABLE RESULTS, RECOMMEND RECOLLECT TO VERIFY 1.35*   < > 1.15*   < > 1.49* 1.58* 1.74*  CALCIUM QUESTIONABLE RESULTS, RECOMMEND RECOLLECT TO VERIFY 7.9*   < > 7.9*   < > 8.5* 8.5* 8.5*  MG QUESTIONABLE RESULTS, RECOMMEND RECOLLECT TO VERIFY 1.5*  --  1.4*  --   --   --   --    < > = values in this interval not displayed.   Liver Function Tests: Recent Labs    08/17/17 1350 08/18/17 0921 08/23/17 0504  AST 32 34 26  ALT 19 19 20   ALKPHOS 114 100 103  BILITOT 0.3 0.3 <0.1*  PROT 5.9* 5.4* 5.3*  ALBUMIN 1.8* 1.6* 1.6*   Recent Labs    08/11/17 1118  LIPASE 19   Recent Labs    05/03/17 0313  AMMONIA 10   CBC: Recent Labs    08/18/17 0921  08/22/17 1242 08/23/17 0504  08/29/17 0429 08/30/17 0348 08/31/17 0342  WBC 13.5*   < > 18.4* 17.9*   < >  21.1* 20.6* 21.4*  NEUTROABS 12.2*  --  16.1* 15.0*  --   --   --   --   HGB 7.2*   < > 10.2* 9.5*   < > 9.0* 9.5* 9.5*  HCT 22.6*   < > 31.4* 30.1*   < > 28.9* 30.3* 29.7*  MCV 90.4   < > 89.0 90.1   < > 91.2 92.1 90.5  PLT 267   < > 269 278   < > 351 316 312   < > = values in this interval not displayed.   Cardiac Enzymes: Recent Labs    08/11/17 1118    TROPONINI 0.05*   BNP: Invalid input(s): POCBNP Lab Results  Component Value Date   HGBA1C 5.4 08/07/2012   Lab Results  Component Value Date   TSH 1.699 08/07/2012   No results found for: VITAMINB12 No results found for: FOLATE Lab Results  Component Value Date   IRON 51 08/13/2017   TIBC 120 (L) 08/13/2017   FERRITIN 4,157 (H) 08/13/2017    Imaging and Procedures obtained prior to SNF admission: No results found.  Assessment/Plan   ICD-10-CM   1. Fungemia B49    C. glabrata  2. Electrolyte disturbance E87.8   3. Leukocytosis, unspecified type D72.829   4. Acute deep vein thrombosis (DVT) of femoral vein of left lower extremity (HCC) I82.412   5. Endometrial/uterine adenocarcinoma ([place]) C54.1   6. Schizoaffective disorder, depressive type ([place]) F25.1   7. Anemia, chronic disease D63.8   8. 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 CKD (chronic kidney disease) stage 3, GFR 30-59 ml/min (HCC) N18.3   10. Chronic bilateral pleural effusions J90   11. Adrenal insufficiency (HCC) E27.40   12. Physical deconditioning R53.81    Cont current meds as ordered  PT/OT/ST as ordered  PICC line care as indicated  Needs wedge to elevate buttocks of bed and rotate per facility protocol to prevent pressure sores  F/u with specialists as scheduled - oncology appt 09/11/17 at 1230pm  Complete antifungal - STOP DATE 09/10/17  GOAL: short term rehab and d/c home when medically appropriate. Communicated with pt and nursing.   Labs/tests ordered: cbc, cmp, pre-albumin    Syrianna Schillaci S. Perlie Gold  [facility] [address] [place], [place] [address] [phone] Cell (Monday-Friday 8 AM - 5 PM) [phone] After 5 PM and follow prompts

## 2017-09-08 ENCOUNTER — Non-Acute Institutional Stay (SKILLED_NURSING_FACILITY): Payer: Medicare Other | Admitting: Adult Health

## 2017-09-08 ENCOUNTER — Encounter: Payer: Self-pay | Admitting: Adult Health

## 2017-09-08 DIAGNOSIS — F251 Schizoaffective disorder, depressive type: Secondary | ICD-10-CM

## 2017-09-08 DIAGNOSIS — E876 Hypokalemia: Secondary | ICD-10-CM | POA: Diagnosis not present

## 2017-09-08 DIAGNOSIS — I82412 Acute embolism and thrombosis of left femoral vein: Secondary | ICD-10-CM

## 2017-09-08 NOTE — Progress Notes (Signed)
Location:   [facility] Room Number: 116 A Place of Service:  SNF (31)   CODE STATUS: Full Code  No Known Allergies  Chief Complaint  Patient presents with  . Medical Management of Chronic Issues    Dvt; hypokalemia; hypomagnesemia; schizophrenia. Weekly follow up for the first 30 days post hospitalization     HPI:  She is a 67 year old short term rehab patient of this facility being seen for the management of her chronic illnesses: dvt; hypokalemia; hypomagnesemia; schizophrenia. She denies any anxiety; no change in appetite; no uncontrolled pain. There are no nursing concerns at this time.   Past Medical History:  Diagnosis Date  . Endometrial cancer ([place])   . Hemorrhoid   . Hypertension   . Paranoid schizophrenia ([place])   . Snake bite     Past Surgical History:  Procedure Laterality Date  . ENDOMETRIAL BIOPSY  08/11/2016  . IR FLUORO GUIDE PORT INSERTION RIGHT  09/05/2016  . IR REMOVAL TUN ACCESS W/ PORT W/O FL MOD SED  08/26/2017  . IR US GUIDE VASC ACCESS RIGHT  09/05/2016  . TUBAL LIGATION      Social History   Socioeconomic History  . Marital status: Divorced    Spouse name: Not on file  . Number of children: 1  . Years of education: Not on file  . Highest education level: Not on file  Occupational History  . Occupation: Retired  Scientific laboratory technician  . Financial resource strain: Not on file  . Food insecurity:    Worry: Not on file    Inability: Not on file  . Transportation needs:    Medical: Not on file    Non-medical: Not on file  Tobacco Use  . Smoking status: Never Smoker  . Smokeless tobacco: Never Used  Substance and Sexual Activity  . Alcohol use: No  . Drug use: No  . Sexual activity: Never  Lifestyle  . Physical activity:    Days per week: Not on file    Minutes per session: Not on file  . Stress: Not on file  Relationships  . Social connections:    Talks on phone: Not on file    Gets together: Not on file    Attends  religious service: Not on file    Active member of club or organization: Not on file    Attends meetings of clubs or organizations: Not on file    Relationship status: Not on file  . Intimate partner violence:    Fear of current or ex partner: Not on file    Emotionally abused: Not on file    Physically abused: Not on file    Forced sexual activity: Not on file  Other Topics Concern  . Not on file  Social History Narrative  . Not on file   Family History  Problem Relation Age of Onset  . Colon cancer Mother        late 44s  . Schizophrenia Sister   . Colon cancer Sister        late 19 s      VITAL SIGNS BP 112/68   Pulse (!) 102   Temp 98.1 F (36.7 C)   Resp 18   Ht 5\' 4"  (1.626 m)   Wt 158 lb 9.6 oz (71.9 kg)   SpO2 95%   BMI 27.22 kg/m   Outpatient Encounter Medications as of 09/08/2017  Medication Sig  . anidulafungin (ERAXIS) IVPB Inject 100 mg into  the vein daily for 11 days. Indication: fungemia Last Day of Therapy: 09/10/2017 Labs - Once weekly:  CBC/D and CMP  . dexamethasone (DECADRON) 2 MG tablet Take 1 tablet (2 mg total) by mouth daily.  . divalproex (DEPAKOTE ER) 500 MG 24 hr tablet Take 500 mg by mouth at bedtime.  . ENSURE (ENSURE) Take 240 mLs by mouth 2 (two) times daily between meals.  . magnesium oxide (MAG-OX) 400 (241.3 Mg) MG tablet Take 1 tablet (400 mg total) by mouth 2 (two) times daily.  . Melatonin 3 MG TABS Take 1 mg by mouth at bedtime.  . ondansetron (ZOFRAN) 8 MG tablet Take 1 tablet (8 mg total) by mouth every 8 (eight) hours as needed for nausea.  . prochlorperazine (COMPAZINE) 10 MG tablet Take 1 tablet (10 mg total) by mouth every 6 (six) hours as needed for nausea or vomiting.  . risperiDONE (RISPERDAL) 0.5 MG tablet Take 1 tablet (0.5 mg total) by mouth daily.   No facility-administered encounter medications on file as of 09/08/2017.      SIGNIFICANT DIAGNOSTIC EXAMS  PREVIOUS:   06-20-17: ct of chest abdomen and pelvis: 1.  Slightly improved omental disease. Scattered peritoneal surface disease and mesenteric disease appears relatively stable. 2. New cystic lesion peripherally in the right hepatic lobe could be treated peritoneal surface disease. 3. Persistent extensive endometrial neoplasm involving the lower uterine segment and cervix. 4. Cholelithiasis. 5. Moderate-sized right effusion with overlying atelectasis but no enhancing pleural nodules or pulmonary nodules. 6. Deep venous thrombosis in the left common femoral vein.  08-11-17: chest x-ray: Mild right basilar subsegmental atelectasis is noted with associated pleural effusion.  08-17-17: chest x-ray: Worsening moderate size right effusion and worsening small left effusion likely with associated bibasilar atelectasis. Right IJ Port-A-Cath unchanged with tip over the right atrium.   08-17-17: kub: . Prominent stool throughout the colon favors constipation. No obstruction.  TODAY:   08-27-17: CT of chest abdomen and pelvis: Increased size of two 1 cm low-attenuation lesions along the capsular surface of the liver dome, suspicious for peritoneal carcinoma. Other areas of soft tissue density in the omental fat are unchanged, consistent with peritoneal carcinomatosis. No significant change in soft tissue mass in the lower uterine segment and cervix, with probable adjacent extra-uterine extension. Increased bilateral pleural effusions, mild ascites, and diffuse body wall edema. Stable tiny indeterminate sub-cm bilateral upper lobe pulmonary nodules. Recommend continued follow-up by chest CT in 6 months. 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 Cholelithiasis.  No radiographic evidence of cholecystitis.      LABS REVIEWED PREVIOUS:   08-17-17: wbc 12.1; hgb 9.1; hct 28,7 mcv 90.0 plt 324; glucose 100; bun 31; creat 1.39; k+ 3.7; na++ 138; ca 8.6; liver normal albumin 1.8; blood and urine culture: no growth 08-18-17: cortisol 4.8  (low) 08-19-17: wbc 15.5; hgb 7.0; hct 21.7; mcv 91.2; plt 306; glucose 107; bun 31; creat 1.15; k+ 4.3; na++ 141; ca 7.9; mag 1.4 08-20-17: wbc 17.0; hgb 10.1; hct 30.5; mcv 87.1 ;plt 257; glucose 95; bun 33; creat 1.17; k+ 4.8; na++ 138; ca 8.0 08-22-17: wbc 18.4; hgb 10.2; hct 31.4; mcv 89.0; plt 269; glucose 111; bun 30; creat 1.07; k+ 4.3; na++ 137; ca 8.2; depakote 14; blood culture: no growth 08-23-17: wbc 17.9; hgb 9.5; hct 30.1; mcv 90.1 ;plt 278; glucose 93; bun 30; creat 1.12; k+ 4.5; na++ 135; ca 8.5; liver normal albumin 1.6;  08-27-17: CA 125: 687.0;  08-28-17: blood culture:  no growth 08-30-17: wbc 20.6; hgb 9.5; hct 30.3; mcv 92.1; plt 316; glucose 95; bun 35; creat 1.58; k+ 4.3; an++ 136; ca 8.5 08-31-17: wbc 21.4; hgb 9.5; hct 29.7; mcv 90.5; plt 312; glucose 91; bun 38; creat 1.74; k+ 4.4; an++ 136; ca 8.5    NO NEW LABS.     Review of Systems  Constitutional: Negative for malaise/fatigue.  Respiratory: Negative for cough and shortness of breath.   Cardiovascular: Negative for chest pain, palpitations and leg swelling.  Gastrointestinal: Negative for abdominal pain, constipation and heartburn.  Musculoskeletal: Negative for back pain, joint pain and myalgias.  Skin: Negative.   Neurological: Negative for dizziness.  Psychiatric/Behavioral: The patient is not nervous/anxious.       Physical Exam  Constitutional: She appears well-developed and well-nourished. No distress.  HENT:  Is bald; scalp is flaky  Neck: No thyromegaly present.  Cardiovascular: Normal rate, regular rhythm, normal heart sounds and intact distal pulses.  Pulmonary/Chest: Effort normal and breath sounds normal. No respiratory distress.  Abdominal: Soft. Bowel sounds are normal. She exhibits no distension. There is no tenderness.  Musculoskeletal: She exhibits no edema.  Able to move all extremities   Lymphadenopathy:    She has no cervical adenopathy.  Neurological: She is alert.  Skin: Skin is warm and  dry. She is not diaphoretic.  Psychiatric: She has a normal mood and affect.       ASSESSMENT/ PLAN:  TODAY:  1. Left femoral vein DVT: stable will continue xarelto 20 mg daily   2. Schizoaffective disorder; depressive type: is presently stable will continue risperdal 1 mg daily and depakote ER 500 mg nightly   3. Hypomagnesemia: mag level 1.4; will continue mag ox 400 mg twice daily  4. Hypokalemia: k+ 4.8 stable will continue k+ 10 meq daily  PREVIOUS   5. Nausea and vomiting:has adrenal insuffiencey:  is followed by oncology; will continue decadron 4 mg twice daily    6. Cancer associated pain: is being managed will continue vicodin 10/325 mg every 4 hours as needed  7. Protein calorie malnutrition, severe: weight is 158 pounds; albumin is 1.8; will continue supplements as directed  8.  Endometrial/uterine adenocarcinoma: stage III (cT3, Cn1a): is being followed by oncology; is on chemotherapy as directed; does have some vaginal bleeding present. Will monitor  9. Anemia of chronic disease: stable hgb 10.1: is status post 2 u PRBCs transfusion. Will monitor  10. SIRS: is stable possibly due to adrenal insufficiency: will continue decadron 4 mg twice daily   11. Leukocytosis: without change wbc 21.4; will monitor  12. CKD stage 3: stable bun 38 creat 1.74  13. Chronic bilateral pleural effusion: is stable without symptoms will monitor     MD is aware of resident's narcotic use and is in agreement with current plan of care. We will attempt to wean resident as apropriate   Ok Edwards NP [facility]  Contact [phone] Monday through Friday 8am- 5pm  After hours call [phone]

## 2017-09-09 ENCOUNTER — Non-Acute Institutional Stay (SKILLED_NURSING_FACILITY): Payer: Medicare Other | Admitting: Adult Health

## 2017-09-09 ENCOUNTER — Encounter: Payer: Self-pay | Admitting: Adult Health

## 2017-09-09 DIAGNOSIS — R0902 Hypoxemia: Secondary | ICD-10-CM | POA: Diagnosis not present

## 2017-09-09 DIAGNOSIS — R000 Tachycardia, unspecified: Secondary | ICD-10-CM | POA: Diagnosis not present

## 2017-09-09 LAB — HEPATIC FUNCTION PANEL
ALT: 23 (ref 7–35)
AST: 39 — AB (ref 13–35)
Alkaline Phosphatase: 248 — AB (ref 25–125)
Bilirubin, Total: 0.3

## 2017-09-09 LAB — BASIC METABOLIC PANEL
BUN: 32 — AB (ref 4–21)
BUN: 34 — AB (ref 4–21)
Creatinine: 1.6 — AB (ref 0.5–1.1)
Creatinine: 1.7 — AB (ref 0.5–1.1)
Glucose: 114
Glucose: 86
POTASSIUM: 4.3 (ref 3.4–5.3)
Potassium: 4.4 (ref 3.4–5.3)
Sodium: 132 — AB (ref 137–147)
Sodium: 134 — AB (ref 137–147)

## 2017-09-09 LAB — CBC AND DIFFERENTIAL
HEMATOCRIT: 26 — AB (ref 36–46)
HEMATOCRIT: 27 — AB (ref 36–46)
HEMOGLOBIN: 8.4 — AB (ref 12.0–16.0)
HEMOGLOBIN: 8.9 — AB (ref 12.0–16.0)
NEUTROS ABS: 12
Neutrophils Absolute: 12
PLATELETS: 426 — AB (ref 150–399)
PLATELETS: 436 — AB (ref 150–399)
WBC: 15.2
WBC: 16.6

## 2017-09-09 NOTE — Progress Notes (Signed)
Location:   [facility] Room Number: 116 A Place of Service:  SNF (31)   CODE STATUS: Full Code  No Known Allergies  Chief Complaint  Patient presents with  . Acute Visit    Tachycardia    HPI:  Staff reports that she is having shortness of breath and tachycardia. Her 02 sat on room are 90-91%; her heart rate is slightly irregular. Her breath sounds are diminished. She does complain of epigastric pain; which was relieved with mylanta. There are no reports of fevers present. Her wbc is 16.6 which is improved.   Past Medical History:  Diagnosis Date  . Endometrial cancer ([place])   . Hemorrhoid   . Hypertension   . Paranoid schizophrenia ([place])   . Snake bite     Past Surgical History:  Procedure Laterality Date  . ENDOMETRIAL BIOPSY  08/11/2016  . IR FLUORO GUIDE PORT INSERTION RIGHT  09/05/2016  . IR REMOVAL TUN ACCESS W/ PORT W/O FL MOD SED  08/26/2017  . IR US GUIDE VASC ACCESS RIGHT  09/05/2016  . TUBAL LIGATION      Social History   Socioeconomic History  . Marital status: Divorced    Spouse name: Not on file  . Number of children: 1  . Years of education: Not on file  . Highest education level: Not on file  Occupational History  . Occupation: Retired  Scientific laboratory technician  . Financial resource strain: Not on file  . Food insecurity:    Worry: Not on file    Inability: Not on file  . Transportation needs:    Medical: Not on file    Non-medical: Not on file  Tobacco Use  . Smoking status: Never Smoker  . Smokeless tobacco: Never Used  Substance and Sexual Activity  . Alcohol use: No  . Drug use: No  . Sexual activity: Never  Lifestyle  . Physical activity:    Days per week: Not on file    Minutes per session: Not on file  . Stress: Not on file  Relationships  . Social connections:    Talks on phone: Not on file    Gets together: Not on file    Attends religious service: Not on file    Active member of club or organization: Not on file   Attends meetings of clubs or organizations: Not on file    Relationship status: Not on file  . Intimate partner violence:    Fear of current or ex partner: Not on file    Emotionally abused: Not on file    Physically abused: Not on file    Forced sexual activity: Not on file  Other Topics Concern  . Not on file  Social History Narrative  . Not on file   Family History  Problem Relation Age of Onset  . Colon cancer Mother        late 43s  . Schizophrenia Sister   . Colon cancer Sister        late 19 s      VITAL SIGNS BP 108/73   Pulse (!) 118   Temp 97.9 F (36.6 C)   Resp 20   Ht '5\' 4"'$  (1.626 m)   Wt 158 lb 9.6 oz (71.9 kg)   SpO2 96%   BMI 27.22 kg/m   Outpatient Encounter Medications as of 09/09/2017  Medication Sig  . anidulafungin (ERAXIS) IVPB Inject 100 mg into the vein daily for 11 days. Indication: fungemia Last Day  of Therapy: 09/10/2017 Labs - Once weekly:  CBC/D and CMP  . dexamethasone (DECADRON) 2 MG tablet Take 1 tablet (2 mg total) by mouth daily.  . divalproex (DEPAKOTE ER) 500 MG 24 hr tablet Take 500 mg by mouth at bedtime.  . ENSURE (ENSURE) Take 240 mLs by mouth 2 (two) times daily between meals.  . magnesium oxide (MAG-OX) 400 (241.3 Mg) MG tablet Take 1 tablet (400 mg total) by mouth 2 (two) times daily.  . Melatonin 3 MG TABS Take 1 mg by mouth at bedtime.  . ondansetron (ZOFRAN) 8 MG tablet Take 1 tablet (8 mg total) by mouth every 8 (eight) hours as needed for nausea.  . prochlorperazine (COMPAZINE) 10 MG tablet Take 1 tablet (10 mg total) by mouth every 6 (six) hours as needed for nausea or vomiting.  . risperiDONE (RISPERDAL) 0.5 MG tablet Take 1 tablet (0.5 mg total) by mouth daily.   No facility-administered encounter medications on file as of 09/09/2017.      SIGNIFICANT DIAGNOSTIC EXAMS  PREVIOUS:   06-20-17: ct of chest abdomen and pelvis: 1. Slightly improved omental disease. Scattered peritoneal surface disease and mesenteric  disease appears relatively stable. 2. New cystic lesion peripherally in the right hepatic lobe could be treated peritoneal surface disease. 3. Persistent extensive endometrial neoplasm involving the lower uterine segment and cervix. 4. Cholelithiasis. 5. Moderate-sized right effusion with overlying atelectasis but no enhancing pleural nodules or pulmonary nodules. 6. Deep venous thrombosis in the left common femoral vein.  08-11-17: chest x-ray: Mild right basilar subsegmental atelectasis is noted with associated pleural effusion.  08-17-17: chest x-ray: Worsening moderate size right effusion and worsening small left effusion likely with associated bibasilar atelectasis. Right IJ Port-A-Cath unchanged with tip over the right atrium.   08-17-17: kub: . Prominent stool throughout the colon favors constipation. No obstruction.  08-27-17: CT of chest abdomen and pelvis: Increased size of two 1 cm low-attenuation lesions along the capsular surface of the liver dome, suspicious for peritoneal carcinoma. Other areas of soft tissue density in the omental fat are unchanged, consistent with peritoneal carcinomatosis. No significant change in soft tissue mass in the lower uterine segment and cervix, with probable adjacent extra-uterine extension. Increased bilateral pleural effusions, mild ascites, and diffuse body wall edema. Stable tiny indeterminate sub-cm bilateral upper lobe pulmonary nodules. Recommend continued follow-up by chest CT in 6 months. 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 Cholelithiasis.  No radiographic evidence of cholecystitis.  NO NEW EXAMS.       LABS REVIEWED PREVIOUS:   08-17-17: wbc 12.1; hgb 9.1; hct 28,7 mcv 90.0 plt 324; glucose 100; bun 31; creat 1.39; k+ 3.7; na++ 138; ca 8.6; liver normal albumin 1.8; blood and urine culture: no growth 08-18-17: cortisol 4.8 (low) 08-19-17: wbc 15.5; hgb 7.0; hct 21.7; mcv 91.2; plt 306; glucose 107; bun 31; creat  1.15; k+ 4.3; na++ 141; ca 7.9; mag 1.4 08-20-17: wbc 17.0; hgb 10.1; hct 30.5; mcv 87.1 ;plt 257; glucose 95; bun 33; creat 1.17; k+ 4.8; na++ 138; ca 8.0 08-22-17: wbc 18.4; hgb 10.2; hct 31.4; mcv 89.0; plt 269; glucose 111; bun 30; creat 1.07; k+ 4.3; na++ 137; ca 8.2; depakote 14; blood culture: no growth 08-23-17: wbc 17.9; hgb 9.5; hct 30.1; mcv 90.1 ;plt 278; glucose 93; bun 30; creat 1.12; k+ 4.5; na++ 135; ca 8.5; liver normal albumin 1.6;  08-27-17: CA 125: 687.0;  08-28-17: blood culture: no growth 08-30-17: wbc 20.6; hgb 9.5; hct  30.3; mcv 92.1; plt 316; glucose 95; bun 35; creat 1.58; k+ 4.3; an++ 136; ca 8.5 08-31-17: wbc 21.4; hgb 9.5; hct 29.7; mcv 90.5; plt 312; glucose 91; bun 38; creat 1.74; k+ 4.4; an++ 136; ca 8.5    TODAY:   09-09-17: wbc 16.6; hgb 8.9; mcv 88.4 ;plt 4326; glucose 86; bun 32; creat 1.64; k+ 4.3; na++ 132; ast 39; alk phos 248; albumin 2.3     Review of Systems  Constitutional: Negative for malaise/fatigue.  Respiratory: Positive for shortness of breath. Negative for cough.   Cardiovascular: Negative for chest pain, palpitations and leg swelling.  Gastrointestinal: Negative for abdominal pain, constipation and heartburn.  Musculoskeletal: Negative for back pain, joint pain and myalgias.  Skin: Negative.   Neurological: Negative for dizziness.  Psychiatric/Behavioral: The patient is not nervous/anxious.     Physical Exam  Constitutional: She is oriented to person, place, and time. She appears well-developed and well-nourished. No distress.  Neck: No thyromegaly present.  Cardiovascular: Normal heart sounds and intact distal pulses.  Is tachycardic Heart rate slightly irregular   Pulmonary/Chest: Effort normal. No respiratory distress.  Breath sounds diminished   Abdominal: Soft. She exhibits no distension. There is no tenderness.  Bowel sounds hypoactive No stool in rectal vault   Musculoskeletal: Normal range of motion. She exhibits no edema.    Lymphadenopathy:    She has no cervical adenopathy.  Neurological: She is alert and oriented to person, place, and time.  Skin: Skin is warm and dry. She is not diaphoretic.  Psychiatric: She has a normal mood and affect.    ASSESSMENT/ PLAN:  TODAY:  1.  Tachycardia 2. Hypoxia  Will begin 02 2L/[place] Will get cbc; bmp; chest x-ray; kub and EKG.    MD is aware of resident's narcotic use and is in agreement with current plan of care. We will attempt to wean resident as apropriate   Ok Edwards NP [facility]  Contact [phone] Monday through Friday 8am- 5pm  After hours call [phone]

## 2017-09-10 ENCOUNTER — Non-Acute Institutional Stay (SKILLED_NURSING_FACILITY): Payer: Medicare Other | Admitting: Adult Health

## 2017-09-10 ENCOUNTER — Other Ambulatory Visit: Payer: Self-pay

## 2017-09-10 ENCOUNTER — Inpatient Hospital Stay (HOSPITAL_COMMUNITY)
Admission: EM | Admit: 2017-09-10 | Discharge: 2017-09-11 | DRG: 871 | Disposition: A | Discharge status: Hospice | Payer: Medicare Other | Attending: Internal Medicine | Admitting: Internal Medicine

## 2017-09-10 ENCOUNTER — Telehealth: Payer: Self-pay | Admitting: Hematology and Oncology

## 2017-09-10 ENCOUNTER — Encounter: Payer: Self-pay | Admitting: Adult Health

## 2017-09-10 ENCOUNTER — Telehealth: Payer: Self-pay

## 2017-09-10 ENCOUNTER — Emergency Department (HOSPITAL_COMMUNITY): Payer: Medicare Other

## 2017-09-10 ENCOUNTER — Encounter (HOSPITAL_COMMUNITY): Payer: Self-pay | Admitting: Family Medicine

## 2017-09-10 DIAGNOSIS — Z7901 Long term (current) use of anticoagulants: Secondary | ICD-10-CM

## 2017-09-10 DIAGNOSIS — G893 Neoplasm related pain (acute) (chronic): Secondary | ICD-10-CM | POA: Diagnosis present

## 2017-09-10 DIAGNOSIS — Z6822 Body mass index (BMI) 22.0-22.9, adult: Secondary | ICD-10-CM | POA: Diagnosis not present

## 2017-09-10 DIAGNOSIS — R4182 Altered mental status, unspecified: Secondary | ICD-10-CM | POA: Diagnosis present

## 2017-09-10 DIAGNOSIS — J159 Unspecified bacterial pneumonia: Secondary | ICD-10-CM

## 2017-09-10 DIAGNOSIS — Z8542 Personal history of malignant neoplasm of other parts of uterus: Secondary | ICD-10-CM

## 2017-09-10 DIAGNOSIS — J189 Pneumonia, unspecified organism: Secondary | ICD-10-CM | POA: Diagnosis present

## 2017-09-10 DIAGNOSIS — Z66 Do not resuscitate: Secondary | ICD-10-CM | POA: Diagnosis present

## 2017-09-10 DIAGNOSIS — J90 Pleural effusion, not elsewhere classified: Secondary | ICD-10-CM

## 2017-09-10 DIAGNOSIS — G9341 Metabolic encephalopathy: Secondary | ICD-10-CM | POA: Diagnosis present

## 2017-09-10 DIAGNOSIS — C541 Malignant neoplasm of endometrium: Secondary | ICD-10-CM | POA: Diagnosis present

## 2017-09-10 DIAGNOSIS — Z800 Family history of malignant neoplasm of digestive organs: Secondary | ICD-10-CM

## 2017-09-10 DIAGNOSIS — E860 Dehydration: Secondary | ICD-10-CM | POA: Diagnosis present

## 2017-09-10 DIAGNOSIS — G9340 Encephalopathy, unspecified: Secondary | ICD-10-CM | POA: Diagnosis not present

## 2017-09-10 DIAGNOSIS — Y95 Nosocomial condition: Secondary | ICD-10-CM | POA: Diagnosis present

## 2017-09-10 DIAGNOSIS — Z170 Estrogen receptor positive status [ER+]: Secondary | ICD-10-CM | POA: Diagnosis not present

## 2017-09-10 DIAGNOSIS — N179 Acute kidney failure, unspecified: Secondary | ICD-10-CM | POA: Diagnosis present

## 2017-09-10 DIAGNOSIS — F251 Schizoaffective disorder, depressive type: Secondary | ICD-10-CM | POA: Diagnosis present

## 2017-09-10 DIAGNOSIS — E785 Hyperlipidemia, unspecified: Secondary | ICD-10-CM | POA: Diagnosis present

## 2017-09-10 DIAGNOSIS — Z86718 Personal history of other venous thrombosis and embolism: Secondary | ICD-10-CM

## 2017-09-10 DIAGNOSIS — D630 Anemia in neoplastic disease: Secondary | ICD-10-CM | POA: Diagnosis not present

## 2017-09-10 DIAGNOSIS — N938 Other specified abnormal uterine and vaginal bleeding: Secondary | ICD-10-CM | POA: Diagnosis not present

## 2017-09-10 DIAGNOSIS — R0603 Acute respiratory distress: Secondary | ICD-10-CM | POA: Diagnosis present

## 2017-09-10 DIAGNOSIS — Z515 Encounter for palliative care: Secondary | ICD-10-CM | POA: Diagnosis present

## 2017-09-10 DIAGNOSIS — N189 Chronic kidney disease, unspecified: Secondary | ICD-10-CM

## 2017-09-10 DIAGNOSIS — E2740 Unspecified adrenocortical insufficiency: Secondary | ICD-10-CM | POA: Diagnosis present

## 2017-09-10 DIAGNOSIS — C799 Secondary malignant neoplasm of unspecified site: Secondary | ICD-10-CM

## 2017-09-10 DIAGNOSIS — E43 Unspecified severe protein-calorie malnutrition: Secondary | ICD-10-CM | POA: Diagnosis present

## 2017-09-10 DIAGNOSIS — J918 Pleural effusion in other conditions classified elsewhere: Secondary | ICD-10-CM | POA: Diagnosis present

## 2017-09-10 DIAGNOSIS — I129 Hypertensive chronic kidney disease with stage 1 through stage 4 chronic kidney disease, or unspecified chronic kidney disease: Secondary | ICD-10-CM | POA: Diagnosis present

## 2017-09-10 DIAGNOSIS — D638 Anemia in other chronic diseases classified elsewhere: Secondary | ICD-10-CM | POA: Diagnosis present

## 2017-09-10 DIAGNOSIS — A419 Sepsis, unspecified organism: Principal | ICD-10-CM | POA: Diagnosis present

## 2017-09-10 DIAGNOSIS — R000 Tachycardia, unspecified: Secondary | ICD-10-CM | POA: Diagnosis not present

## 2017-09-10 DIAGNOSIS — Z9221 Personal history of antineoplastic chemotherapy: Secondary | ICD-10-CM

## 2017-09-10 DIAGNOSIS — I82412 Acute embolism and thrombosis of left femoral vein: Secondary | ICD-10-CM | POA: Diagnosis not present

## 2017-09-10 DIAGNOSIS — D500 Iron deficiency anemia secondary to blood loss (chronic): Secondary | ICD-10-CM | POA: Diagnosis not present

## 2017-09-10 DIAGNOSIS — R5081 Fever presenting with conditions classified elsewhere: Secondary | ICD-10-CM | POA: Diagnosis not present

## 2017-09-10 HISTORY — DX: Hypokalemia: E87.6

## 2017-09-10 HISTORY — DX: Insomnia, unspecified: G47.00

## 2017-09-10 HISTORY — DX: Unspecified adrenocortical insufficiency: E27.40

## 2017-09-10 HISTORY — DX: Hypomagnesemia: E83.42

## 2017-09-10 HISTORY — DX: Weakness: R53.1

## 2017-09-10 HISTORY — DX: Candidal sepsis: B37.7

## 2017-09-10 HISTORY — DX: Chronic embolism and thrombosis of unspecified vein: I82.91

## 2017-09-10 LAB — URINALYSIS, ROUTINE W REFLEX MICROSCOPIC
Bilirubin Urine: NEGATIVE
GLUCOSE, UA: NEGATIVE mg/dL
Ketones, ur: NEGATIVE mg/dL
NITRITE: NEGATIVE
Protein, ur: 100 mg/dL — AB
SPECIFIC GRAVITY, URINE: 1.017 (ref 1.005–1.030)
pH: 5 (ref 5.0–8.0)

## 2017-09-10 LAB — CBC
HCT: 25.4 % — ABNORMAL LOW (ref 36.0–46.0)
HEMOGLOBIN: 8 g/dL — AB (ref 12.0–15.0)
MCH: 29.1 pg (ref 26.0–34.0)
MCHC: 31.5 g/dL (ref 30.0–36.0)
MCV: 92.4 fL (ref 78.0–100.0)
Platelets: 357 10*3/uL (ref 150–400)
RBC: 2.75 MIL/uL — ABNORMAL LOW (ref 3.87–5.11)
RDW: 18.5 % — AB (ref 11.5–15.5)
WBC: 13 10*3/uL — AB (ref 4.0–10.5)

## 2017-09-10 LAB — BASIC METABOLIC PANEL
ANION GAP: 14 (ref 5–15)
BUN: 39 mg/dL — ABNORMAL HIGH (ref 6–20)
CALCIUM: 8.7 mg/dL — AB (ref 8.9–10.3)
CO2: 30 mmol/L (ref 22–32)
Chloride: 92 mmol/L — ABNORMAL LOW (ref 101–111)
Creatinine, Ser: 1.86 mg/dL — ABNORMAL HIGH (ref 0.44–1.00)
GFR calc Af Amer: 31 mL/min — ABNORMAL LOW (ref >60)
GFR, EST NON AFRICAN AMERICAN: 27 mL/min — AB (ref >60)
GLUCOSE: 105 mg/dL — AB (ref 65–99)
Potassium: 4.1 mmol/L (ref 3.5–5.1)
SODIUM: 136 mmol/L (ref 135–145)

## 2017-09-10 LAB — PROCALCITONIN: PROCALCITONIN: 1.47 ng/mL

## 2017-09-10 LAB — BLOOD GAS, ARTERIAL
ACID-BASE EXCESS: 4.6 mmol/L — AB (ref 0.0–2.0)
BICARBONATE: 28.5 mmol/L — AB (ref 20.0–28.0)
DRAWN BY: 232811
O2 Content: 2 L/min
O2 Saturation: 97.4 %
PATIENT TEMPERATURE: 101.6
pCO2 arterial: 45.2 mmHg (ref 32.0–48.0)
pH, Arterial: 7.425 (ref 7.350–7.450)
pO2, Arterial: 96.4 mmHg (ref 83.0–108.0)

## 2017-09-10 LAB — I-STAT CG4 LACTIC ACID, ED
Lactic Acid, Venous: 2.58 mmol/L (ref 0.5–1.9)
Lactic Acid, Venous: 1.68 mmol/L (ref 0.5–1.9)

## 2017-09-10 LAB — HEPATIC FUNCTION PANEL
ALK PHOS: 179 U/L — AB (ref 38–126)
ALT: 20 U/L (ref 14–54)
AST: 34 U/L (ref 15–41)
Albumin: 1.7 g/dL — ABNORMAL LOW (ref 3.5–5.0)
BILIRUBIN DIRECT: 0.1 mg/dL (ref 0.1–0.5)
BILIRUBIN TOTAL: 0.4 mg/dL (ref 0.3–1.2)
Indirect Bilirubin: 0.3 mg/dL (ref 0.3–0.9)
Total Protein: 6.6 g/dL (ref 6.5–8.1)

## 2017-09-10 LAB — VALPROIC ACID LEVEL: Valproic Acid Lvl: 53 ug/mL (ref 50.0–100.0)

## 2017-09-10 LAB — GLUCOSE, CAPILLARY: GLUCOSE-CAPILLARY: 100 mg/dL — AB (ref 65–99)

## 2017-09-10 MED ORDER — ONDANSETRON HCL 4 MG/2ML IJ SOLN: 4.0000 mg | Freq: Four times a day (QID) | INTRAMUSCULAR | Status: DC | PRN

## 2017-09-10 MED ORDER — GUAIFENESIN ER 600 MG PO TB12
600.0000 mg | ORAL_TABLET | Freq: Two times a day (BID) | ORAL | Status: DC
  Administered 2017-09-10: 600 mg via ORAL
  Filled 2017-09-10 (×2): qty 1

## 2017-09-10 MED ORDER — MAGNESIUM OXIDE 400 (241.3 MG) MG PO TABS
400.0000 mg | ORAL_TABLET | Freq: Two times a day (BID) | ORAL | Status: DC
  Administered 2017-09-10: 400 mg via ORAL
  Filled 2017-09-10: qty 1

## 2017-09-10 MED ORDER — HYDROCORTISONE NA SUCCINATE PF 100 MG IJ SOLR
100.0000 mg | Freq: Once | INTRAMUSCULAR | Status: AC
  Administered 2017-09-10: 100 mg via INTRAVENOUS
  Filled 2017-09-10: qty 2

## 2017-09-10 MED ORDER — ENSURE ENLIVE PO LIQD: 237.0000 mL | Freq: Two times a day (BID) | ORAL | Status: DC

## 2017-09-10 MED ORDER — VANCOMYCIN HCL IN DEXTROSE 1-5 GM/200ML-% IV SOLN: 1000.0000 mg | INTRAVENOUS | Status: DC

## 2017-09-10 MED ORDER — IPRATROPIUM-ALBUTEROL 0.5-2.5 (3) MG/3ML IN SOLN: 3.0000 mL | RESPIRATORY_TRACT | Status: DC | PRN

## 2017-09-10 MED ORDER — FENTANYL CITRATE (PF) 100 MCG/2ML IJ SOLN
25.0000 ug | INTRAMUSCULAR | Status: DC | PRN
  Administered 2017-09-11 (×2): 25 ug via INTRAVENOUS
  Filled 2017-09-10 (×2): qty 2

## 2017-09-10 MED ORDER — ONDANSETRON HCL 4 MG PO TABS: 4.0000 mg | ORAL_TABLET | Freq: Four times a day (QID) | ORAL | Status: DC | PRN

## 2017-09-10 MED ORDER — ENOXAPARIN SODIUM 40 MG/0.4ML ~~LOC~~ SOLN: 40.0000 mg | SUBCUTANEOUS | Status: DC

## 2017-09-10 MED ORDER — SODIUM CHLORIDE 0.9% FLUSH
3.0000 mL | Freq: Two times a day (BID) | INTRAVENOUS | Status: DC
  Administered 2017-09-10: 3 mL via INTRAVENOUS

## 2017-09-10 MED ORDER — SODIUM CHLORIDE 0.9 % IV SOLN
2.0000 g | Freq: Once | INTRAVENOUS | Status: AC
  Administered 2017-09-10: 2 g via INTRAVENOUS
  Filled 2017-09-10: qty 2

## 2017-09-10 MED ORDER — LACTATED RINGERS IV BOLUS (SEPSIS)
250.0000 mL | Freq: Once | INTRAVENOUS | Status: AC
  Administered 2017-09-10: 250 mL via INTRAVENOUS

## 2017-09-10 MED ORDER — VANCOMYCIN HCL IN DEXTROSE 1-5 GM/200ML-% IV SOLN
1000.0000 mg | Freq: Once | INTRAVENOUS | Status: AC
  Administered 2017-09-10: 1000 mg via INTRAVENOUS
  Filled 2017-09-10: qty 200

## 2017-09-10 MED ORDER — RISPERIDONE 0.25 MG PO TABS
0.5000 mg | ORAL_TABLET | Freq: Every day | ORAL | Status: DC
  Filled 2017-09-10: qty 2

## 2017-09-10 MED ORDER — MELATONIN 3 MG PO TABS
1.5000 mg | ORAL_TABLET | Freq: Every day | ORAL | Status: DC
  Administered 2017-09-10: 1.5 mg via ORAL
  Filled 2017-09-10: qty 0.5

## 2017-09-10 MED ORDER — ACETAMINOPHEN 650 MG RE SUPP
650.0000 mg | Freq: Four times a day (QID) | RECTAL | Status: DC | PRN
  Administered 2017-09-10: 650 mg via RECTAL
  Filled 2017-09-10: qty 1

## 2017-09-10 MED ORDER — ACETAMINOPHEN 325 MG PO TABS: 650.0000 mg | ORAL_TABLET | Freq: Four times a day (QID) | ORAL | Status: DC | PRN

## 2017-09-10 MED ORDER — SODIUM CHLORIDE 0.9 % IV SOLN: 2.0000 g | INTRAVENOUS | Status: DC

## 2017-09-10 MED ORDER — LACTATED RINGERS IV BOLUS (SEPSIS)
1000.0000 mL | Freq: Once | INTRAVENOUS | Status: AC
  Administered 2017-09-10: 1000 mL via INTRAVENOUS

## 2017-09-10 MED ORDER — FENTANYL CITRATE (PF) 100 MCG/2ML IJ SOLN
100.0000 ug | Freq: Once | INTRAMUSCULAR | Status: AC
  Administered 2017-09-10: 100 ug via INTRAVENOUS
  Filled 2017-09-10: qty 2

## 2017-09-10 MED ORDER — FAMOTIDINE IN NACL 20-0.9 MG/50ML-% IV SOLN
20.0000 mg | Freq: Two times a day (BID) | INTRAVENOUS | Status: DC
  Administered 2017-09-10: 20 mg via INTRAVENOUS
  Filled 2017-09-10: qty 50

## 2017-09-10 MED ORDER — DIVALPROEX SODIUM ER 250 MG PO TB24
500.0000 mg | ORAL_TABLET | Freq: Every day | ORAL | Status: DC
  Administered 2017-09-10: 500 mg via ORAL
  Filled 2017-09-10: qty 2

## 2017-09-10 MED ORDER — SODIUM CHLORIDE 0.9 % IV SOLN
100.0000 mg | INTRAVENOUS | Status: DC
  Administered 2017-09-10: 100 mg via INTRAVENOUS
  Filled 2017-09-10: qty 100

## 2017-09-10 MED ORDER — DEXAMETHASONE 2 MG PO TABS
2.0000 mg | ORAL_TABLET | Freq: Every day | ORAL | Status: DC
  Filled 2017-09-10: qty 1

## 2017-09-10 MED ORDER — SODIUM CHLORIDE 0.9 % IV BOLUS
1000.0000 mL | Freq: Once | INTRAVENOUS | Status: AC
  Administered 2017-09-10: 1000 mL via INTRAVENOUS

## 2017-09-10 NOTE — ED Notes (Signed)
Called Abby, RN to give report x 4. And has been unavailable to get report , and stated she was in a pt room, Requested RN to call back ED  when avaliable to get report .

## 2017-09-10 NOTE — ED Provider Notes (Signed)
[facility] DEPT Provider Note   CSN: [id number] Arrival date & time: 09/10/17  1420     History   Chief Complaint Chief Complaint  Patient presents with  . Shortness of Breath  . Tachycardia  . Anxiety  . Weakness    HPI Emily Duarte is a 67 y.o. female. Level 5 caveat due to altered mental status. HPI Patient brought in with shortness of breath fever cough and mental status changes.  History of metastatic endometrial cancer.  Recent admission to the hospital with Candida bacteremia.  Has a PICC line in and has been getting Eraxis.  Unsure if she is still getting it but was supposed to finish up today.  Reported x-ray done through PCP at the nursing home that showed potential pneumonia.  Patient really cannot provide history. Past Medical History:  Diagnosis Date  . Candida sepsis ([place])   . 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 Endometrial cancer ([place])   . Generalized weakness   . Hemorrhoid   . Hypertension   . Hypokalemia   . Hypomagnesemia   . Insomnia   . Paranoid schizophrenia ([place])   . Snake bite   . 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 History of chemotherapy   . Immunocompromised due to corticosteroids   . Protein-calorie malnutrition, severe 08/25/2017  . Pressure injury of skin 08/25/2017  . Endometrial cancer ([place])   . Fungemia 08/22/2017  . CKD (chronic kidney disease) stage 3, GFR 30-59 ml/min (HCC) 08/21/2017  . Chronic bilateral pleural effusions 08/21/2017  . Leukocytosis 08/21/2017  . SIRS (systemic inflammatory response syndrome) ([place]) 08/18/2017  . Schizophrenia ([place])   . Dehydration 08/17/2017  . Malnutrition ([place]) 08/13/2017  . UTI (urinary tract infection) 08/11/2017  . Hypomagnesemia 06/23/2017  . Acute prerenal failure ([place]) 06/23/2017  . Left femoral vein DVT ([place]) 06/20/2017  . Lung nodule < 6cm on CT  06/06/2017  . Hypokalemia 06/06/2017  . Nausea with vomiting 06/06/2017  . Delusions ([place])   . Goals of care, counseling/discussion 03/12/2017  . Cancer associated pain 03/12/2017  . Schizoaffective disorder, depressive type ([place]) 02/05/2017  . Peripheral neuropathy due to chemotherapy ([place]) 12/09/2016  . Elevated liver enzymes 09/06/2016  . Anemia, chronic disease 09/06/2016  . 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 Bipolar 1 disorder, mixed, severe ([place]) 08/07/2012  . Cyst of soft tissue 08/07/2012    Past Surgical History:  Procedure Laterality Date  . ENDOMETRIAL BIOPSY  08/11/2016  . IR FLUORO GUIDE PORT INSERTION RIGHT  09/05/2016  . IR REMOVAL TUN ACCESS W/ PORT W/O FL MOD SED  08/26/2017  . IR US GUIDE VASC ACCESS RIGHT  09/05/2016  . TUBAL LIGATION       OB History    Gravida  1   Para  1   Term      Preterm      AB      Living  1     SAB      TAB      Ectopic      Multiple      Live Births  1            Home Medications    Prior to Admission medications   Medication Sig Start Date End Date Taking? Authorizing Provider  dexamethasone (DECADRON) 2 MG tablet Take 1 tablet (2 mg total) by mouth daily. 08/30/17  Yes  Dessa Phi, DO  divalproex (DEPAKOTE ER) 500 MG 24 hr tablet Take 500 mg by mouth at bedtime.   Yes [provider]  ENSURE (ENSURE) Take 240 mLs by mouth 2 (two) times daily between meals.   Yes [provider]  magnesium oxide (MAG-OX) 400 (241.3 Mg) MG tablet Take 1 tablet (400 mg total) by mouth 2 (two) times daily. 06/23/17  Yes Gorsuch, Ni, MD  Melatonin 3 MG TABS Take 1 mg by mouth at bedtime. 09/02/17  Yes [provider]  ondansetron (ZOFRAN) 8 MG tablet Take 1 tablet (8 mg total) by mouth every 8 (eight) hours as needed for nausea. 04/10/17  Yes Heath Lark, MD  prochlorperazine (COMPAZINE) 10 MG tablet Take 1 tablet (10 mg total) by mouth every 6 (six) hours as needed for nausea or  vomiting. 04/10/17  Yes Gorsuch, Ni, MD  risperiDONE (RISPERDAL) 0.5 MG tablet Take 1 tablet (0.5 mg total) by mouth daily. 08/30/17  Yes Dessa Phi, DO    Family History Family History  Problem Relation Age of Onset  . Colon cancer Mother        late 71s  . Schizophrenia Sister   . Colon cancer Sister        late 70 s    Social History Social History   Tobacco Use  . Smoking status: Never Smoker  . Smokeless tobacco: Never Used  Substance Use Topics  . Alcohol use: No  . Drug use: No     Allergies   Patient has no known allergies.   Review of Systems Review of Systems  Unable to perform ROS: Mental status change     Physical Exam Updated Vital Signs BP 100/70 (BP Location: Left Arm)   Pulse (!) 136   Temp 97.8 F (36.6 C) (Oral)   Resp (!) 24   Ht 5\' 5"  (1.651 m)   Wt 60.8 kg (134 lb) Comment: Family present and reports weight taken two days ago at [facility]  SpO2 98%   BMI 22.30 kg/m   Physical Exam  Constitutional: She appears well-developed.  HENT:  Head: Atraumatic.  Neck: Neck supple.  Cardiovascular:  Regular tachycardia.  Pulmonary/Chest:  Diffuse harsh breath sounds.  Abdominal: There is no tenderness.  Musculoskeletal:       Right lower leg: She exhibits no tenderness.       Left lower leg: She exhibits no tenderness.  PICC line right upper extremity.  Dressing intact.  No erythema seen from around the dressing.  Neurological:  Sitting in bed with her eyes closed.  Will arouse somewhat to stimulation but somewhat confused.  Moving her extremities.  Skin: Skin is warm. Capillary refill takes less than 2 seconds.  Psychiatric:  Some confusion     ED Treatments / Results  Labs (all labs ordered are listed, but only abnormal results are displayed) Labs Reviewed  BASIC METABOLIC PANEL - Abnormal; Notable for the following components:      Result Value   Chloride 92 (*)    Glucose, Bld 105 (*)     BUN 39 (*)    Creatinine, Ser 1.86 (*)    Calcium 8.7 (*)    GFR calc non Af Amer 27 (*)    GFR calc Af Amer 31 (*)    All other components within normal limits  CBC - Abnormal; Notable for the following components:   WBC 13.0 (*)    RBC 2.75 (*)    Hemoglobin 8.0 (*)  HCT 25.4 (*)    RDW 18.5 (*)    All other components within normal limits  URINALYSIS, ROUTINE W REFLEX MICROSCOPIC - Abnormal; Notable for the following components:   APPearance HAZY (*)    Hgb urine dipstick LARGE (*)    Protein, ur 100 (*)    Leukocytes, UA SMALL (*)    Bacteria, UA RARE (*)    All other components within normal limits  HEPATIC FUNCTION PANEL - Abnormal; Notable for the following components:   Albumin 1.7 (*)    Alkaline Phosphatase 179 (*)    All other components within normal limits  I-STAT CG4 LACTIC ACID, ED - Abnormal; Notable for the following components:   Lactic Acid, Venous 2.58 (*)    All other components within normal limits  CULTURE, BLOOD (ROUTINE X 2)  CULTURE, BLOOD (ROUTINE X 2)  VALPROIC ACID LEVEL  I-STAT CG4 LACTIC ACID, ED    EKG EKG Interpretation  Date/Time:  Wednesday Sep 10 2017 15:02:03 EDT Ventricular Rate:  133 PR Interval:    QRS Duration: 76 QT Interval:  284 QTC Calculation: 423 R Axis:   0 Text Interpretation:  Sinus tachycardia Repol abnrm suggests ischemia, diffuse leads No significant change since last tracing Confirmed by Orlie Dakin [phone]) on 09/10/2017 3:06:01 PM Also confirmed by Davonna Belling [phone])  on 09/10/2017 3:34:07 PM   Radiology Dg Chest 2 View  Result Date: 09/10/2017 CLINICAL DATA:  Shortness of breath and tachycardia EXAM: CHEST - 2 VIEW COMPARISON:  None. FINDINGS: Right-sided PICC line tip is in the lower SVC. There is a small right pleural effusion with hazy opacities throughout the right lung. Cardiomediastinal contours are normal. Left lung is clear. IMPRESSION: Medium-sized right pleural effusion with associated  atelectasis. Electronically Signed   By: Ulyses Jarred M.D.   On: 09/10/2017 17:14    Procedures Procedures (including critical care time)  Medications Ordered in ED Medications  lactated ringers bolus 1,000 mL (1,000 mLs Intravenous New Bag/Given 09/10/17 1710)    And  lactated ringers bolus 250 mL (has no administration in time range)  anidulafungin (ERAXIS) 100 mg in sodium chloride 0.9 % 100 mL IVPB (has no administration in time range)  sodium chloride 0.9 % bolus 1,000 mL (1,000 mLs Intravenous New Bag/Given 09/10/17 1555)  ceFEPIme (MAXIPIME) 2 g in sodium chloride 0.9 % 100 mL IVPB (0 g Intravenous Stopped 09/10/17 1828)  vancomycin (VANCOCIN) IVPB 1000 mg/200 mL premix (1,000 mg Intravenous New Bag/Given 09/10/17 1620)  hydrocortisone sodium succinate (SOLU-CORTEF) 100 MG injection 100 mg (100 mg Intravenous Given 09/10/17 1621)  fentaNYL (SUBLIMAZE) injection 100 mcg (100 mcg Intravenous Given 09/10/17 1828)     Initial Impression / Assessment and Plan / ED Course  I have reviewed the triage vital signs and the nursing notes.  Pertinent labs & imaging results that were available during my care of the patient were reviewed by me and considered in my medical decision making (see chart for details).     Patient with fever mental status changes.  Has had cough and URI symptoms but also recent Candida bacteremia.  May have been finished on Eraxis versus last dose in the last couple or still taking today.  Urine does not show infection.  White count elevated but improved from previous.  However code sepsis activated.  Cefepime and vancomycin given.  Discussed with Dr. Drucilla Schmidt from infectious disease.  Restart or continue Eraxis.  Will admit to internal medicine.  Discussed with the patient's son.  Patient thinks that she would want to be a DNR but he had not directly discussed with her.  Discussed with nursing they said that she had mentioned being a DNR in the past.   CRITICAL  CARE Performed by: Davonna Belling Total critical care time: 30 minutes Critical care time was exclusive of separately billable procedures and treating other patients. Critical care was necessary to treat or prevent imminent or life-threatening deterioration. Critical care was time spent personally by me on the following activities: development of treatment plan with patient and/or surrogate as well as nursing, discussions with consultants, evaluation of patient's response to treatment, examination of patient, obtaining history from patient or surrogate, ordering and performing treatments and interventions, ordering and review of laboratory studies, ordering and review of radiographic studies, pulse oximetry and re-evaluation of patient's condition.  Final Clinical Impressions(s) / ED Diagnoses   Final diagnoses:  Sepsis, due to unspecified organism [facility])  Metastatic cancer ([place])  AKI (acute kidney injury) [facility])    ED Discharge Orders    None       Davonna Belling, MD 09/10/17 1906

## 2017-09-10 NOTE — ED Notes (Signed)
ED TO INPATIENT HANDOFF REPORT  Name/Age/Gender Emily Duarte 67 y.o. female  Code Status    Code Status Orders  (From admission, onward)        Start     Ordered   09/10/17 2018  Full code  Continuous     09/10/17 2017    Code Status History    Date Active Date Inactive Code Status Order ID Comments User Context   09/10/2017 1937 09/10/2017 2017 DNR [id number]  Norval Morton, MD ED   08/22/2017 1824 08/31/2017 1727 Full Code [id number]  Tawni Millers, MD Inpatient   08/17/2017 1716 08/20/2017 1752 Full Code [id number]  Lavina Hamman, MD ED   08/11/2017 1543 08/15/2017 2142 Full Code [id number]  Kayleen Memos, DO ED   05/03/2017 0720 05/05/2017 1653 Full Code [id number]  Lorin Glass, PA-C ED   02/04/2017 1224 02/05/2017 1442 Full Code [id number]  Malvin Johns, MD ED   08/02/2016 1528 08/06/2016 1128 Full Code [id number]  Jola Schmidt, MD ED   08/06/2012 1652 08/07/2012 0243 Full Code [id number]  Norman Herrlich, NP ED    Advance Directive Documentation     Most Recent Value  Type of Advance Directive  Healthcare Power of Attorney  Pre-existing out of facility DNR order (yellow form or pink MOST form)  -  "MOST" Form in Place?  -      Home/SNF/Other Home  Chief Complaint Metastatic cancer ([place]) [C79.9] AKI (acute kidney injury) ([place]) [N17.9] Sepsis, due to unspecified organism ([place]) [A41.9] Sepsis ([place]) [A41.9]  Level of Care/Admitting Diagnosis ED Disposition    ED Disposition Condition [place]: [place] [100102]  Level of Care: Stepdown [14]  Admit to SDU based on following criteria: Respiratory Distress:  Frequent assessment and/or intervention to maintain adequate ventilation/respiration, pulmonary toilet, and respiratory treatment.  Diagnosis: Sepsis [facility]) [[id number]]  Admitting Physician: Norval Morton [[id number]]  Attending Physician: Norval Morton [[id number]]  Estimated length of stay: past  midnight tomorrow  Certification:: I certify this patient will need inpatient services for at least 2 midnights  PT Class (Do Not Modify): Inpatient [101]  PT Acc Code (Do Not Modify): Private [1]       Medical History Past Medical History:  Diagnosis Date  . Candida sepsis ([place])   . 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 Endometrial cancer ([place])   . Generalized weakness   . Hemorrhoid   . Hypertension   . Hypokalemia   . Hypomagnesemia   . Insomnia   . Paranoid schizophrenia ([place])   . Snake bite   . Unspecified adrenocortical insufficiency ([facility])     Allergies No Known Allergies  IV Location/Drains/Wounds Patient Lines/Drains/Airways Status   Active Line/Drains/Airways    Name:   Placement date:   Placement time:   Site:   Days:   Peripheral IV 08/30/17 Right Hand   08/30/17    1030    Hand   11   PICC Single Lumen 58/85/02 PICC Right Basilic 38 cm 0 cm   77/41/28    7867    Basilic   11   External Urinary Catheter   08/22/17    0840    -   19   Pressure Injury 08/22/17 Stage II -  Partial thickness loss of dermis presenting as a shallow open ulcer with a red, pink wound bed without slough. Red wound bed   08/22/17    1815  19          Labs/Imaging Results for orders placed or performed during the hospital encounter of 09/10/17 (from the past 48 hour(s))  Hepatic function panel     Status: Abnormal   Collection Time: 09/10/17  3:28 PM  Result Value Ref Range   Total Protein 6.6 6.5 - 8.1 g/dL   Albumin 1.7 (L) 3.5 - 5.0 g/dL   AST 34 15 - 41 U/L   ALT 20 14 - 54 U/L   Alkaline Phosphatase 179 (H) 38 - 126 U/L   Total Bilirubin 0.4 0.3 - 1.2 mg/dL   Bilirubin, Direct 0.1 0.1 - 0.5 mg/dL   Indirect Bilirubin 0.3 0.3 - 0.9 mg/dL    Comment: Performed at [facility], [place] [address], [place], [place] [address]  Basic metabolic panel     Status: Abnormal   Collection Time: 09/10/17  3:29 PM  Result Value Ref Range   Sodium  136 135 - 145 mmol/L   Potassium 4.1 3.5 - 5.1 mmol/L   Chloride 92 (L) 101 - 111 mmol/L   CO2 30 22 - 32 mmol/L   Glucose, Bld 105 (H) 65 - 99 mg/dL   BUN 39 (H) 6 - 20 mg/dL   Creatinine, Ser 1.86 (H) 0.44 - 1.00 mg/dL   Calcium 8.7 (L) 8.9 - 10.3 mg/dL   GFR calc non Af Amer 27 (L) >60 mL/min   GFR calc Af Amer 31 (L) >60 mL/min    Comment: (NOTE) The eGFR has been calculated using the CKD EPI equation. This calculation has not been validated in all clinical situations. eGFR's persistently <60 mL/min signify possible Chronic Kidney Disease.    Anion gap 14 5 - 15    Comment: Performed at [facility], [place] [address], [place], [place] [address]  CBC     Status: Abnormal   Collection Time: 09/10/17  3:29 PM  Result Value Ref Range   WBC 13.0 (H) 4.0 - 10.5 K/uL   RBC 2.75 (L) 3.87 - 5.11 MIL/uL   Hemoglobin 8.0 (L) 12.0 - 15.0 g/dL   HCT 25.4 (L) 36.0 - 46.0 %   MCV 92.4 78.0 - 100.0 fL   MCH 29.1 26.0 - 34.0 pg   MCHC 31.5 30.0 - 36.0 g/dL   RDW 18.5 (H) 11.5 - 15.5 %   Platelets 357 150 - 400 K/uL    Comment: Performed at [facility], [place] [address], [place], [place] [address]  I-Stat CG4 Lactic Acid, ED     Status: Abnormal   Collection Time: 09/10/17  3:41 PM  Result Value Ref Range   Lactic Acid, Venous 2.58 (HH) 0.5 - 1.9 mmol/L   Comment NOTIFIED PHYSICIAN   Valproic acid level     Status: None   Collection Time: 09/10/17  3:44 PM  Result Value Ref Range   Valproic Acid Lvl 53 50.0 - 100.0 ug/mL    Comment: Performed at [facility], [place] [address] [place], [place] [address]  Urinalysis, Routine w reflex microscopic     Status: Abnormal   Collection Time: 09/10/17  5:45 PM  Result Value Ref Range   Color, Urine YELLOW YELLOW   APPearance HAZY (A) CLEAR   Specific Gravity, Urine 1.017 1.005 - 1.030   pH 5.0 5.0 - 8.0   Glucose, UA NEGATIVE NEGATIVE mg/dL   Hgb urine dipstick LARGE (A) NEGATIVE    Bilirubin Urine NEGATIVE NEGATIVE   Ketones,  ur NEGATIVE NEGATIVE mg/dL   Protein, ur 100 (A) NEGATIVE mg/dL   Nitrite NEGATIVE NEGATIVE   Leukocytes, UA SMALL (A) NEGATIVE   RBC / HPF 6-10 0 - 5 RBC/hpf   WBC, UA 11-20 0 - 5 WBC/hpf   Bacteria, UA RARE (A) NONE SEEN   Squamous Epithelial / LPF 6-10 0 - 5   Amorphous Crystal PRESENT     Comment: Performed at [facility], [place] [address], [place], [place] [address]  I-Stat CG4 Lactic Acid, ED     Status: None   Collection Time: 09/10/17  6:35 PM  Result Value Ref Range   Lactic Acid, Venous 1.68 0.5 - 1.9 mmol/L  Procalcitonin     Status: None   Collection Time: 09/10/17  8:32 PM  Result Value Ref Range   Procalcitonin 1.47 ng/mL    Comment:        Interpretation: PCT > 0.5 ng/mL and <= 2 ng/mL: Systemic infection (sepsis) is possible, but other conditions are known to elevate PCT as well. (NOTE)       Sepsis PCT Algorithm           Lower Respiratory Tract                                      Infection PCT Algorithm    ----------------------------     ----------------------------         PCT < 0.25 ng/mL                PCT < 0.10 ng/mL         Strongly encourage             Strongly discourage   discontinuation of antibiotics    initiation of antibiotics    ----------------------------     -----------------------------       PCT 0.25 - 0.50 ng/mL            PCT 0.10 - 0.25 ng/mL               OR       >80% decrease in PCT            Discourage initiation of                                            antibiotics      Encourage discontinuation           of antibiotics    ----------------------------     -----------------------------         PCT >= 0.50 ng/mL              PCT 0.26 - 0.50 ng/mL                AND       <80% decrease in PCT             Encourage initiation of                                             antibiotics       Encourage continuation           of  antibiotics     ----------------------------     -----------------------------        PCT >= 0.50 ng/mL                  PCT > 0.50 ng/mL               AND         increase in PCT                  Strongly encourage                                      initiation of antibiotics    Strongly encourage escalation           of antibiotics                                     -----------------------------                                           PCT <= 0.25 ng/mL                                                 OR                                        > 80% decrease in PCT                                     Discontinue / Do not initiate                                             antibiotics Performed at [facility] [address] [place], [place] [address]    Dg Chest 2 View  Result Date: 09/10/2017 CLINICAL DATA:  Shortness of breath and tachycardia EXAM: CHEST - 2 VIEW COMPARISON:  None. FINDINGS: Right-sided PICC line tip is in the lower SVC. There is a small right pleural effusion with hazy opacities throughout the right lung. Cardiomediastinal contours are normal. Left lung is clear. IMPRESSION: Medium-sized right pleural effusion with associated atelectasis. Electronically Signed   By: Ulyses Jarred M.D.   On: 09/10/2017 17:14    Pending Labs Unresulted Labs (From admission, onward)   Start     Ordered   09/12/17 0500  Creatinine, serum  Daily,   R     09/10/17 2006   09/11/17 0500  CBC  Tomorrow morning,   R     09/10/17 1937   09/11/17 0100  Basic metabolic panel  Tomorrow morning,   R     09/10/17 1937   09/10/17 2210  MRSA PCR Screening  Once,   R     09/10/17 2210   09/10/17 2107  Type and screen [place]  Once,   R    Comments:  [place]    09/10/17 2106   09/10/17 1536  Culture, blood (routine x 2)  BLOOD CULTURE X 2,   STAT     09/10/17 1536      Vitals/Pain Today's Vitals   09/10/17 1930 09/10/17 1944 09/10/17  2000 09/10/17 2100  BP: 110/90 120/78 107/72 122/77  Pulse: (!) 129 (!) 127 (!) 124 (!) 132  Resp: 19 (!) 23 (!) 21 (!) 31  Temp:      TempSrc:      SpO2: 99% 99% 100% 95%  Weight:      Height:        Isolation Precautions No active isolations  Medications Medications  lactated ringers bolus 1,000 mL (0 mLs Intravenous Stopped 09/10/17 2213)    And  lactated ringers bolus 250 mL (has no administration in time range)  anidulafungin (ERAXIS) 100 mg in sodium chloride 0.9 % 100 mL IVPB (has no administration in time range)  dexamethasone (DECADRON) tablet 2 mg (has no administration in time range)  divalproex (DEPAKOTE ER) 24 hr tablet 500 mg (has no administration in time range)  feeding supplement (ENSURE ENLIVE) (ENSURE ENLIVE) liquid 237 mL (has no administration in time range)  magnesium oxide (MAG-OX) tablet 400 mg (has no administration in time range)  Melatonin TABS 1.5 mg (has no administration in time range)  risperiDONE (RISPERDAL) tablet 0.5 mg (has no administration in time range)  sodium chloride flush (NS) 0.9 % injection 3 mL (has no administration in time range)  ondansetron (ZOFRAN) tablet 4 mg (has no administration in time range)    Or  ondansetron (ZOFRAN) injection 4 mg (has no administration in time range)  acetaminophen (TYLENOL) tablet 650 mg (has no administration in time range)    Or  acetaminophen (TYLENOL) suppository 650 mg (has no administration in time range)  guaiFENesin (MUCINEX) 12 hr tablet 600 mg (has no administration in time range)  ipratropium-albuterol (DUONEB) 0.5-2.5 (3) MG/3ML nebulizer solution 3 mL (has no administration in time range)  ceFEPIme (MAXIPIME) 2 g in sodium chloride 0.9 % 100 mL IVPB (has no administration in time range)  vancomycin (VANCOCIN) IVPB 1000 mg/200 mL premix (has no administration in time range)  famotidine (PEPCID) IVPB 20 mg premix (has no administration in time range)  fentaNYL (SUBLIMAZE) injection 25 mcg  (has no administration in time range)  sodium chloride 0.9 % bolus 1,000 mL (0 mLs Intravenous Stopped 09/10/17 2003)  ceFEPIme (MAXIPIME) 2 g in sodium chloride 0.9 % 100 mL IVPB (0 g Intravenous Stopped 09/10/17 1828)  vancomycin (VANCOCIN) IVPB 1000 mg/200 mL premix (0 mg Intravenous Stopped 09/10/17 2003)  hydrocortisone sodium succinate (SOLU-CORTEF) 100 MG injection 100 mg (100 mg Intravenous Given 09/10/17 1621)  fentaNYL (SUBLIMAZE) injection 100 mcg (100 mcg Intravenous Given 09/10/17 1828)    Mobility non-ambulatory

## 2017-09-10 NOTE — Progress Notes (Signed)
Pt seen, resting comfortably, little to no respiratory distress or increased wob noted at this time.  [id number], [id number], spo2 98% on 2lnc, temp 101.6.  RN notified.  Bipap not indicated at this time.  CPT done via bed x10 minutes which pt tolerated well without incident.  RT will continue to monitor and assess pt as needed.

## 2017-09-10 NOTE — H&P (Signed)
History and Physical    [name] ERD:[id number] DOB: 22-Feb-1951 DOA: 09/10/2017  Referring MD/NP/PA: Dr. Davonna Belling PCP: Nolene Ebbs, MD  Patient coming from: Nursing facility  Chief Complaint: Cough and shortness of breath  I have personally briefly reviewed patient's old medical records in [facility]   HPI: Emily Duarte is a 67 y.o. female with medical history significant of HTN, HLD, metastatic endometrial cancer, left femoral DVT on Xarelto, and adrenal insufficiency;  who presents with complaints of cough and shortness of breath.  History is obtained from family as patient is currently altered and unable to give her own.  Patient was just recently admitted to the hospital April 28- May 1, due to hypotension related to adrenal insufficiency.  Subsequently, readmitted May 3-12 after blood cultures resulted positive for Candida Glabrata.  Patient was sent to nursing facility to continue receiving Eraxis with a PICC line.  Family reported patient appeared to be having worsening respiratory distress with a weak cough.  She was placed on oxygen the other day for comfort.  She was noted to be more altered and therefore sent to the hospital for further evaluation.  It was reported that PCP at nursing facility obtained a chest x-ray that showed potential pneumonia.  Patient had been off of chemotherapy treatments since her last treatment on 4/12 of doxorubicin, and was supposed to have a follow-up with her oncologist Dr. Alvy Bimler tomorrow.  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  Review of records make note that the patient has had intermittent vaginal spotting.  Also, from nursing home records from the physician stated the patient should be on Xarelto as of 5/20, but does not appear that patient had been  receiving this medication.   ED Course: Upon admission into the emergency department patient was noted to be febrile up to 102.3 F, heart rate 109-136, respirations 20-30, blood pressure 100/70-123/83, O2 saturation 93 to 100% on 2 L nasal cannula oxygen.  Lab work revealed WBC 13, hemoglobin 8, BUN 39, creatinine 1.86, and  initial lactic acid 2.58.  Case was discussed with ID.  Patient was given 100 mg of Solu-Cortef, 2.25 L of normal saline IV fluids, vancomycin, cefepime, and Eraxis.  [facility] called to admit.  In discussions with family they report initially to the ED physician that they wanted to make patient a DNR, but retracted this upon reevaluation until able to talk with her cancer doctors.  Review of Systems  Unable to perform ROS: Mental status change  Respiratory: Positive for cough and shortness of breath.   Genitourinary:       Vaginal spotting    Past Medical History:  Diagnosis Date  . Candida sepsis ([place])   . 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 Endometrial cancer ([facility])   . Generalized weakness   . Hemorrhoid   . Hypertension   . Hypokalemia   . Hypomagnesemia   . Insomnia   . Paranoid schizophrenia ([place])   . Snake bite   . Unspecified adrenocortical insufficiency ([facility])     Past Surgical History:  Procedure Laterality Date  . ENDOMETRIAL BIOPSY  08/11/2016  . IR FLUORO GUIDE PORT INSERTION RIGHT  09/05/2016  . IR REMOVAL TUN ACCESS W/ PORT W/O FL MOD SED  08/26/2017  . IR US GUIDE VASC ACCESS RIGHT  09/05/2016  .  TUBAL LIGATION       reports that she has never smoked. She has never used smokeless tobacco. She reports that she does not drink alcohol or use drugs.  No Known Allergies  Family History  Problem Relation Age of Onset  . Colon cancer Mother        late 40s  . Schizophrenia Sister   . Colon cancer Sister        late 64 s    Prior to Admission medications   Medication Sig Start Date End Date Taking? Authorizing Provider    dexamethasone (DECADRON) 2 MG tablet Take 1 tablet (2 mg total) by mouth daily. 08/30/17  Yes Dessa Phi, DO  divalproex (DEPAKOTE ER) 500 MG 24 hr tablet Take 500 mg by mouth at bedtime.   Yes [provider]  ENSURE (ENSURE) Take 240 mLs by mouth 2 (two) times daily between meals.   Yes [provider]  magnesium oxide (MAG-OX) 400 (241.3 Mg) MG tablet Take 1 tablet (400 mg total) by mouth 2 (two) times daily. 06/23/17  Yes Gorsuch, Ni, MD  Melatonin 3 MG TABS Take 1 mg by mouth at bedtime. 09/02/17  Yes [provider]  ondansetron (ZOFRAN) 8 MG tablet Take 1 tablet (8 mg total) by mouth every 8 (eight) hours as needed for nausea. 04/10/17  Yes Heath Lark, MD  prochlorperazine (COMPAZINE) 10 MG tablet Take 1 tablet (10 mg total) by mouth every 6 (six) hours as needed for nausea or vomiting. 04/10/17  Yes Gorsuch, Ni, MD  risperiDONE (RISPERDAL) 0.5 MG tablet Take 1 tablet (0.5 mg total) by mouth daily. 08/30/17  Yes Dessa Phi, DO    Physical Exam:  Constitutional: Elderly female who appears to be in moderate respiratory distress is lethargic but alert at this time. Vitals:   09/10/17 1600 09/10/17 1730 09/10/17 1734 09/10/17 1900  BP: 117/77 100/70 100/70 112/84  Pulse: (!) 136 (!) 136 (!) 136 (!) 126  Resp: (!) 30 (!) 26 (!) 24 (!) 21  Temp:   97.8 F (36.6 C)   TempSrc:   Oral   SpO2: 96% 94% 98% 100%  Weight:   60.8 kg (134 lb)   Height:   5\' 5"  (1.651 m)    Eyes: PERRL, lids and conjunctivae normal ENMT: Mucous membranes are dry posterior pharynx clear of any exudate or lesions.  Neck: normal, supple, no masses, no thyromegaly Respiratory: Tachypneic with coarse and decreased breath sounds most notably on the right lung field.  Patient using some accessory muscle usage.  Patient on 2 L of nasal cannula oxygen with O2 saturations 99%. Cardiovascular: Tachycardic, no murmurs / rubs / gallops.  Mild lower extremity edema. 2+ pedal pulses. No carotid  bruits.  Abdomen: no tenderness, no masses palpated. No hepatosplenomegaly. Bowel sounds positive.  Musculoskeletal: no clubbing / cyanosis. No joint deformity upper and lower extremities. Good ROM, no contractures. Normal muscle tone.  Skin: no rashes, lesions, ulcers. No induration Neurologic: CN 2-12 grossly intact.  Able to move all extremities Psychiatric: Patient only responds okay, but cannot assess her orientation.    Labs on Admission: I have personally reviewed following labs and imaging studies  CBC: Recent Labs  Lab 09/09/17 09/10/17 1529  WBC 16.6  15.2 13.0*  NEUTROABS 12  12  --   HGB 8.9*  8.4* 8.0*  HCT 27*  26* 25.4*  MCV  --  92.4  PLT 436*  426* 081   Basic Metabolic Panel: Recent Labs  Lab 09/09/17 09/10/17 1529  NA 132*  134* 136  K 4.3  4.4 4.1  CL  --  92*  CO2  --  30  GLUCOSE  --  105*  BUN 32*  34* 39*  CREATININE 1.6*  1.7* 1.86*  CALCIUM  --  8.7*   GFR: Estimated Creatinine Clearance: 26 mL/min (A) (by C-G formula based on SCr of 1.86 mg/dL (H)). Liver Function Tests: Recent Labs  Lab 09/09/17 09/10/17 1528  AST 39* 34  ALT 23 20  ALKPHOS 248* 179*  BILITOT  --  0.4  PROT  --  6.6  ALBUMIN  --  1.7*   No results for input(s): LIPASE, AMYLASE in the last 168 hours. No results for input(s): AMMONIA in the last 168 hours. Coagulation Profile: No results for input(s): INR, PROTIME in the last 168 hours. Cardiac Enzymes: No results for input(s): CKTOTAL, CKMB, CKMBINDEX, TROPONINI in the last 168 hours. BNP (last 3 results) No results for input(s): PROBNP in the last 8760 hours. HbA1C: No results for input(s): HGBA1C in the last 72 hours. CBG: No results for input(s): GLUCAP in the last 168 hours. Lipid Profile: No results for input(s): CHOL, HDL, LDLCALC, TRIG, CHOLHDL, LDLDIRECT in the last 72 hours. Thyroid Function Tests: No results for input(s): TSH, T4TOTAL, FREET4, T3FREE, THYROIDAB in the last 72 hours. Anemia  Panel: No results for input(s): VITAMINB12, FOLATE, FERRITIN, TIBC, IRON, RETICCTPCT in the last 72 hours. Urine analysis:    Component Value Date/Time   COLORURINE YELLOW 09/10/2017 1745   APPEARANCEUR HAZY (A) 09/10/2017 1745   LABSPEC 1.017 09/10/2017 1745   PHURINE 5.0 09/10/2017 1745   GLUCOSEU NEGATIVE 09/10/2017 1745   HGBUR LARGE (A) 09/10/2017 1745   BILIRUBINUR NEGATIVE 09/10/2017 1745   KETONESUR NEGATIVE 09/10/2017 1745   PROTEINUR 100 (A) 09/10/2017 1745   UROBILINOGEN 0.2 07/12/2009 1846   NITRITE NEGATIVE 09/10/2017 1745   LEUKOCYTESUR SMALL (A) 09/10/2017 1745   Sepsis Labs: No results found for this or any previous visit (from the past 240 hour(s)).   Radiological Exams on Admission: Dg Chest 2 View  Result Date: 09/10/2017 CLINICAL DATA:  Shortness of breath and tachycardia EXAM: CHEST - 2 VIEW COMPARISON:  None. FINDINGS: Right-sided PICC line tip is in the lower SVC. There is a small right pleural effusion with hazy opacities throughout the right lung. Cardiomediastinal contours are normal. Left lung is clear. IMPRESSION: Medium-sized right pleural effusion with associated atelectasis. Electronically Signed   By: Ulyses Jarred M.D.   On: 09/10/2017 17:14    EKG: Independently reviewed.  Sinus tachycardia  Assessment/Plan Respiratory distress, sepsis 2/2 healthcare associated pneumonia with right-sided pleural effusion: Acute.  Patient presents febrile up to 102.3 F, pulse 109-1 36, respirations 20-30.  Initial lab work revealed WBC 13 with lactic acid 2.58.  Chest x-ray showing moderate right-sided pleural effusion.  Sepsis protocol has been initiated with fluid bolus with 2.25 L normal saline and patient was started on empiric antibiotics of vancomycin and cefepime. - Continue Telemetry bed - Continuous pulse oximetry with nasal cannula oxygen as needed - BiPAP if needed - Follow-up blood and sputum cultures - Add-on procalcitonin - Continue empiric  antibiotics of vancomycin and cefepime - Mucinex - IR consult in a.m. for possible need of thoracentesis  Fungemia: Per review of records patient was to be continued on Eraxis for cultures that grew out candida Glabrata, but is unclear if the patient was receiving this at the nursing facility. - Continue Eraxis -  Follow-up with ID for further recommendations    Acute encephalopathy: Suspect secondary to infection and/or dehydration. - Neurochecks  Metastatic endometrial cancer: Currently being followed by Dr. Alvy Bimler. She received carboplatin and Taxol for 6 cycles.Apparently she tolerated treatment very poorly with progressive weight loss and renal failure.  Patient's last chemotherapy infusion of doxorubicin was on 4/12.  Family would like patient to remain a couple code at this time. - Follow-up with Dr. Alvy Bimler in a.m. - Will need to rediscuss CODE STATUS and goals of care  Acute kidney injury on chronic kidney disease: Patient's kidney function appears to be progressively worsening previously noted to be around 1.1-1.3 earlier in the month.  Patient's creatinine elevated to 1.86 with BUN 39.  Dehydration as a suspected cause.  Patient was given full fluid bolus in the emergency department. - Recheck BMP in a.m. - Monitor intake and output  Anemia of chronic disease: Hemoglobin noted to be 8 and per review of records have been slowly trending downward.  -  Type and screen for possible need of blood products - Recheck CBC in a.m.  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  - Hold for possible need of thoracentesis in a.m. - Reassess for need of continuation of Xarelto  Adrenal insufficiency: Patient was given initial stress dose of Solu-Cortef in the ED at 100 mg. - Continue Decadron  Schizoaffective disorder, depressive type - Continue  Depakote and Risperdal   DVT prophylaxis: None Code Status: Full Family Communication: Discussed plan of care with the patient family present at bedside Disposition Plan: Likely discharge back to skilled nursing facility once medically stable Consults called: none  Admission status: inpatient   Norval Morton MD [facility] Pager [phone]   If 7PM-7AM, please contact night-coverage [email or web address] Password [email or web address]  09/10/2017, 7:10 PM

## 2017-09-10 NOTE — Progress Notes (Signed)
A consult was received from an ED physician for vancomycin and cefepime per pharmacy dosing.  The patient's profile has been reviewed for ht/wt/allergies/indication/available labs.    A one time order has been placed for cefepime 2gm IV x1 and vancomycin 1000 mg IV x1.  Further antibiotics/pharmacy consults should be ordered by admitting physician if indicated.                       Thank you, Lynelle Doctor 09/10/2017  3:58 PM

## 2017-09-10 NOTE — Progress Notes (Signed)
Location:   [facility] Room Number: 116 A Place of Service:  SNF (31)   CODE STATUS: Full Code  No Known Allergies  Chief Complaint  Patient presents with  . Acute Visit    lab follow up    HPI:  There are no reports of fevers present; she remains on 02 at 2L/[place]. Her appetite is diminished. Her chest x-ray demonstrates possible pneumonia and/or pleural effusion. I have spoken with her son; who would feel better if she was sent to the ED for further evaluation and treatment options. I am concerned about her cancer getting worse.   Past Medical History:  Diagnosis Date  . Endometrial cancer ([place])   . Hemorrhoid   . Hypertension   . Paranoid schizophrenia ([place])   . Snake bite     Past Surgical History:  Procedure Laterality Date  . ENDOMETRIAL BIOPSY  08/11/2016  . IR FLUORO GUIDE PORT INSERTION RIGHT  09/05/2016  . IR REMOVAL TUN ACCESS W/ PORT W/O FL MOD SED  08/26/2017  . IR US GUIDE VASC ACCESS RIGHT  09/05/2016  . TUBAL LIGATION      Social History   Socioeconomic History  . Marital status: Divorced    Spouse name: Not on file  . Number of children: 1  . Years of education: Not on file  . Highest education level: Not on file  Occupational History  . Occupation: Retired  Scientific laboratory technician  . Financial resource strain: Not on file  . Food insecurity:    Worry: Not on file    Inability: Not on file  . Transportation needs:    Medical: Not on file    Non-medical: Not on file  Tobacco Use  . Smoking status: Never Smoker  . Smokeless tobacco: Never Used  Substance and Sexual Activity  . Alcohol use: No  . Drug use: No  . Sexual activity: Never  Lifestyle  . Physical activity:    Days per week: Not on file    Minutes per session: Not on file  . Stress: Not on file  Relationships  . Social connections:    Talks on phone: Not on file    Gets together: Not on file    Attends religious service: Not on file    Active member of club or  organization: Not on file    Attends meetings of clubs or organizations: Not on file    Relationship status: Not on file  . Intimate partner violence:    Fear of current or ex partner: Not on file    Emotionally abused: Not on file    Physically abused: Not on file    Forced sexual activity: Not on file  Other Topics Concern  . Not on file  Social History Narrative  . Not on file   Family History  Problem Relation Age of Onset  . Colon cancer Mother        late 59s  . Schizophrenia Sister   . Colon cancer Sister        late 65 s      VITAL SIGNS BP 123/83   Pulse (!) 109   Temp 97.8 F (36.6 C)   Resp 20   Ht _0  (1.626 m)   Wt 158 lb 9.6 oz (71.9 kg)   SpO2 94%   BMI 27.22 kg/m   Outpatient Encounter Medications as of 09/10/2017  Medication Sig  . dexamethasone (DECADRON) 2 MG tablet Take 1 tablet (2 mg  total) by mouth daily.  . divalproex (DEPAKOTE ER) 500 MG 24 hr tablet Take 500 mg by mouth at bedtime.  . ENSURE (ENSURE) Take 240 mLs by mouth 2 (two) times daily between meals.  . magnesium oxide (MAG-OX) 400 (241.3 Mg) MG tablet Take 1 tablet (400 mg total) by mouth 2 (two) times daily.  . Melatonin 3 MG TABS Take 1 mg by mouth at bedtime.  . ondansetron (ZOFRAN) 8 MG tablet Take 1 tablet (8 mg total) by mouth every 8 (eight) hours as needed for nausea.  . prochlorperazine (COMPAZINE) 10 MG tablet Take 1 tablet (10 mg total) by mouth every 6 (six) hours as needed for nausea or vomiting.  . risperiDONE (RISPERDAL) 0.5 MG tablet Take 1 tablet (0.5 mg total) by mouth daily.  . [DISCONTINUED] anidulafungin (ERAXIS) IVPB Inject 100 mg into the vein daily for 11 days. Indication: fungemia Last Day of Therapy: 09/10/2017 Labs - Once weekly:  CBC/D and CMP (Patient not taking: Reported on 09/10/2017)   No facility-administered encounter medications on file as of 09/10/2017.      SIGNIFICANT DIAGNOSTIC EXAMS  PREVIOUS:   06-20-17: ct of chest abdomen and pelvis: 1.  Slightly improved omental disease. Scattered peritoneal surface disease and mesenteric disease appears relatively stable. 2. New cystic lesion peripherally in the right hepatic lobe could be treated peritoneal surface disease. 3. Persistent extensive endometrial neoplasm involving the lower uterine segment and cervix. 4. Cholelithiasis. 5. Moderate-sized right effusion with overlying atelectasis but no enhancing pleural nodules or pulmonary nodules. 6. Deep venous thrombosis in the left common femoral vein.  08-11-17: chest x-ray: Mild right basilar subsegmental atelectasis is noted with associated pleural effusion.  08-17-17: chest x-ray: Worsening moderate size right effusion and worsening small left effusion likely with associated bibasilar atelectasis. Right IJ Port-A-Cath unchanged with tip over the right atrium.   08-17-17: kub: . Prominent stool throughout the colon favors constipation. No obstruction.  08-27-17: CT of chest abdomen and pelvis: Increased size of two 1 cm low-attenuation lesions along the capsular surface of the liver dome, suspicious for peritoneal carcinoma. Other areas of soft tissue density in the omental fat are unchanged, consistent with peritoneal carcinomatosis. No significant change in soft tissue mass in the lower uterine segment and cervix, with probable adjacent extra-uterine extension. Increased bilateral pleural effusions, mild ascites, and diffuse body wall edema. Stable tiny indeterminate sub-cm bilateral upper lobe pulmonary nodules. Recommend continued follow-up by chest CT in 6 months. 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 Cholelithiasis.  No radiographic evidence of cholecystitis.  TODAY:   09-09-17: EKG: sinus tachycardia  09-09-17: chest x-ray: modest lung hypoaeration with mild to modest asymmetric lung right predominant interstitial prominence. The retrocardiac space is not well defined. ADDENDUM: a component of asymmetric right  lung pulmonary congestion and layering right basilar pleural effusion cannot be excluded. Component of infection also cannot be excluded within the right lung.        LABS REVIEWED PREVIOUS:   08-17-17: wbc 12.1; hgb 9.1; hct 28,7 mcv 90.0 plt 324; glucose 100; bun 31; creat 1.39; k+ 3.7; na++ 138; ca 8.6; liver normal albumin 1.8; blood and urine culture: no growth 08-18-17: cortisol 4.8 (low) 08-19-17: wbc 15.5; hgb 7.0; hct 21.7; mcv 91.2; plt 306; glucose 107; bun 31; creat 1.15; k+ 4.3; na++ 141; ca 7.9; mag 1.4 08-20-17: wbc 17.0; hgb 10.1; hct 30.5; mcv 87.1 ;plt 257; glucose 95; bun 33; creat 1.17; k+ 4.8; na++ 138; ca 8.0 08-22-17:  wbc 18.4; hgb 10.2; hct 31.4; mcv 89.0; plt 269; glucose 111; bun 30; creat 1.07; k+ 4.3; na++ 137; ca 8.2; depakote 14; blood culture: no growth 08-23-17: wbc 17.9; hgb 9.5; hct 30.1; mcv 90.1 ;plt 278; glucose 93; bun 30; creat 1.12; k+ 4.5; na++ 135; ca 8.5; liver normal albumin 1.6;  08-27-17: CA 125: 687.0;  08-28-17: blood culture: no growth 08-30-17: wbc 20.6; hgb 9.5; hct 30.3; mcv 92.1; plt 316; glucose 95; bun 35; creat 1.58; k+ 4.3; an++ 136; ca 8.5 08-31-17: wbc 21.4; hgb 9.5; hct 29.7; mcv 90.5; plt 312; glucose 91; bun 38; creat 1.74; k+ 4.4; an++ 136; ca 8.5   09-09-17: wbc 16.6; hgb 8.9; mcv 88.4 ;plt 4326; glucose 86; bun 32; creat 1.64; k+ 4.3; na++ 132; ast 39; alk phos 248; albumin 2.3   TODAY:   08-31-17: (second) wbc 15.2; hgb 8.4; hct 26.4; mcv 87.7; plt 426; glucose 114; bun 33.5; creat 1.67; k+ 4.4; na++ 134 ca 8.7     Review of Systems  Unable to perform ROS: Other (lethargic unable to participate )     Physical Exam  Constitutional: She appears well-developed and well-nourished. No distress.  Neck: No thyromegaly present.  Cardiovascular: Regular rhythm, normal heart sounds and intact distal pulses.  Heart rate is tachycardic   Pulmonary/Chest: Effort normal. No respiratory distress.  02 2L/[place] Breath sounds diminished   Abdominal:  Soft. She exhibits no distension. There is no tenderness.  Bowel sounds hypoactive   Musculoskeletal: Normal range of motion. She exhibits no edema.  Lymphadenopathy:    She has no cervical adenopathy.  Neurological: She is alert.  Skin: Skin is warm and dry. She is not diaphoretic.     ASSESSMENT/ PLAN:  TODAY:   1. Questionable pneumonia 2. Right pleural effusion 3. Tachycardia.   Will begin rocephin 1 gm IV daily will begin ASAP Will begin lopressor 12.5 mg twice daily for tachycardia Will begin lasix 20 mg daily    ADDENDUM:   HER SON HAS REQUESTED THAT SHE BE SENT TO THE HOSPITAL    MD is aware of resident's narcotic use and is in agreement with current plan of care. We will attempt to wean resident as apropriate   Ok Edwards NP [facility]  Contact [phone] Monday through Friday 8am- 5pm  After hours call [phone]

## 2017-09-10 NOTE — Progress Notes (Signed)
Pharmacy Antibiotic Note  Emily Duarte is a 67 y.o. female with hx metastatic endometrial cancer and recent candida gabrata fungemia (she was discharged on 5/11 with 2 weeks of anidulafungin, stop date schedule for 09/10/17).  She presented to the ED on 09/10/2017 with c/o SOB. CXR on 5/22 showed medium size right pleural effusion with atelectasis.  To start vancomycin and cefepime for PNA.  - Tmax 102.3, wbc 13, scr 1.86 (crcl~26), LA down 1.68  Plan: - vancomycin 1000 mg IV q8h - cefepime 2 gm IV q24h - eraxis 100 mg IV q24h per MD - monitor renal function closely - daily scr x3 starting on 5/24  ++ Please note that last day of Eraxis was scheduled for 5/22. Consider d/c antifungal after today's dose or consult ID for recommendations++  _______________________________________  Height: 5\' 5"  (165.1 cm) Weight: 134 lb (60.8 kg)(Family present and reports weight taken two days ago at [facility])) IBW/kg (Calculated) : 57  Temp (24hrs), Avg:99 F (37.2 C), Min:97.8 F (36.6 C), Max:102.3 F (39.1 C)  Recent Labs  Lab 09/09/17 09/10/17 1529 09/10/17 1541 09/10/17 1835  WBC 16.6  15.2 13.0*  --   --   CREATININE 1.6*  1.7* 1.86*  --   --   LATICACIDVEN  --   --  2.58* 1.68    Estimated Creatinine Clearance: 26 mL/min (A) (by C-G formula based on SCr of 1.86 mg/dL (H)).    No Known Allergies   Thank you for allowing pharmacy to be a part of this patient's care.  Lynelle Doctor 09/10/2017 7:51 PM

## 2017-09-10 NOTE — Telephone Encounter (Signed)
[facility] called and left message. Requesting appt to see Dr. Alvy Bimler.  Called back. Patient is having vaginal bleeding intermittently. No more than usual per Nurse. Given appt date and time for tomorrow at 38, it was already scheduled. Verbalized understanding.

## 2017-09-10 NOTE — ED Triage Notes (Signed)
Patient is from [place] and transported via [facility]. Patient was sent for further evaluation for increased shortness of breath, decreased oxygen saturation, and tachycardia. Per EMS, on arrival patient was calm. Patient reported to EMS that she has cancer and gets anxious.

## 2017-09-10 NOTE — Telephone Encounter (Signed)
[facility] called to cancel patient is in the hospital

## 2017-09-10 NOTE — ED Notes (Signed)
Pt is alert  and is verbally responsive. Pt appears weak and has a cough pt requires cuing and prompting to answer questions  l/s noted to have Rhonchi noted to rt side lung field.

## 2017-09-10 NOTE — ED Notes (Signed)
LACTIC ACID 2.58 RN AND DR Edison Nasuti

## 2017-09-11 ENCOUNTER — Inpatient Hospital Stay: Payer: Medicare Other | Admitting: Hematology and Oncology

## 2017-09-11 ENCOUNTER — Inpatient Hospital Stay: Payer: Medicare Other

## 2017-09-11 DIAGNOSIS — E43 Unspecified severe protein-calorie malnutrition: Secondary | ICD-10-CM

## 2017-09-11 DIAGNOSIS — R5081 Fever presenting with conditions classified elsewhere: Secondary | ICD-10-CM

## 2017-09-11 DIAGNOSIS — Z9221 Personal history of antineoplastic chemotherapy: Secondary | ICD-10-CM

## 2017-09-11 DIAGNOSIS — D500 Iron deficiency anemia secondary to blood loss (chronic): Secondary | ICD-10-CM

## 2017-09-11 DIAGNOSIS — J189 Pneumonia, unspecified organism: Secondary | ICD-10-CM | POA: Diagnosis present

## 2017-09-11 DIAGNOSIS — N179 Acute kidney failure, unspecified: Secondary | ICD-10-CM | POA: Diagnosis present

## 2017-09-11 DIAGNOSIS — R0603 Acute respiratory distress: Secondary | ICD-10-CM | POA: Diagnosis present

## 2017-09-11 DIAGNOSIS — I82412 Acute embolism and thrombosis of left femoral vein: Secondary | ICD-10-CM

## 2017-09-11 DIAGNOSIS — Z7901 Long term (current) use of anticoagulants: Secondary | ICD-10-CM

## 2017-09-11 DIAGNOSIS — N938 Other specified abnormal uterine and vaginal bleeding: Secondary | ICD-10-CM

## 2017-09-11 DIAGNOSIS — G893 Neoplasm related pain (acute) (chronic): Secondary | ICD-10-CM

## 2017-09-11 DIAGNOSIS — G9340 Encephalopathy, unspecified: Secondary | ICD-10-CM | POA: Diagnosis present

## 2017-09-11 DIAGNOSIS — Z6822 Body mass index (BMI) 22.0-22.9, adult: Secondary | ICD-10-CM

## 2017-09-11 DIAGNOSIS — N189 Chronic kidney disease, unspecified: Secondary | ICD-10-CM

## 2017-09-11 DIAGNOSIS — D630 Anemia in neoplastic disease: Secondary | ICD-10-CM

## 2017-09-11 LAB — BASIC METABOLIC PANEL
ANION GAP: 12 (ref 5–15)
BUN: 38 mg/dL — AB (ref 6–20)
CALCIUM: 8.6 mg/dL — AB (ref 8.9–10.3)
CO2: 26 mmol/L (ref 22–32)
Chloride: 97 mmol/L — ABNORMAL LOW (ref 101–111)
Creatinine, Ser: 1.68 mg/dL — ABNORMAL HIGH (ref 0.44–1.00)
GFR calc Af Amer: 35 mL/min — ABNORMAL LOW (ref >60)
GFR, EST NON AFRICAN AMERICAN: 30 mL/min — AB (ref >60)
GLUCOSE: 97 mg/dL (ref 65–99)
Potassium: 3.7 mmol/L (ref 3.5–5.1)
Sodium: 135 mmol/L (ref 135–145)

## 2017-09-11 LAB — CBC
HCT: 22.7 % — ABNORMAL LOW (ref 36.0–46.0)
Hemoglobin: 7.2 g/dL — ABNORMAL LOW (ref 12.0–15.0)
MCH: 29.3 pg (ref 26.0–34.0)
MCHC: 31.7 g/dL (ref 30.0–36.0)
MCV: 92.3 fL (ref 78.0–100.0)
PLATELETS: 283 10*3/uL (ref 150–400)
RBC: 2.46 MIL/uL — ABNORMAL LOW (ref 3.87–5.11)
RDW: 18.6 % — AB (ref 11.5–15.5)
WBC: 12.6 10*3/uL — AB (ref 4.0–10.5)

## 2017-09-11 LAB — MRSA PCR SCREENING: MRSA by PCR: POSITIVE — AB

## 2017-09-11 LAB — TYPE AND SCREEN
ABO/RH(D): O POS
Antibody Screen: NEGATIVE

## 2017-09-11 MED ORDER — GLYCOPYRROLATE 0.2 MG/ML IJ SOLN
0.1000 mg | Freq: Three times a day (TID) | INTRAMUSCULAR | Status: DC
  Administered 2017-09-11: 0.1 mg via INTRAVENOUS
  Filled 2017-09-11: qty 1

## 2017-09-11 MED ORDER — SODIUM CHLORIDE 0.9 % IV SOLN: INTRAVENOUS | Status: DC

## 2017-09-11 MED ORDER — LORAZEPAM 2 MG/ML IJ SOLN: 0.5000 mg | Freq: Four times a day (QID) | INTRAMUSCULAR | Status: DC | PRN

## 2017-09-11 MED ORDER — MORPHINE SULFATE (PF) 2 MG/ML IV SOLN: 2.0000 mg | INTRAVENOUS | Status: DC | PRN

## 2017-09-11 NOTE — Progress Notes (Signed)
Initial Nutrition Assessment/Brief Note (comfort care)  Nutrition Brief Note  Patient identified on the Malnutrition Screening Tool (MST) Report.   Per MD note 09/11/17 pt not tolerating treatments and poor prognosis; goals of care discussed with son about transitioning to palliative care/hospice with only comfort measures. RD spoke with RN who reports pt will be officially transitioned to comfort care with the MD to lift NPO.   No nutrition interventions warranted at this time. If goals of care change, please consult RD.   Hope Budds, Dietetic Intern

## 2017-09-11 NOTE — Progress Notes (Signed)
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[place]) She tolerated treatment very poorly with progressive weight loss and recurrent acute on chronic renal failure,recurrent hospitalization and life-threatening infection  The last dose of chemotherapy was on 08/01/2017 Last CT scan showed stable disease without significant tumor shrinkage from treatment Frankly, I am not comfortable to pursue further systemic treatment in the future This has been discussed many times with the son I discussed with her son again today He agreed that he felt that his mother is suffering He agreed to transition her care to comfort measures and residential hospice  Anemia, chronic disease and due to recurrent vaginal bleeding Blood transfusion is unlikely going to bring meaningful prolongation of life I do not recommend transfusion at this point  Acute prerenal failure ([place]),  She has acute on chronic renal failure likely secondary to poor oral fluid intake and dehydration She has received fluid resuscitation I do not recommend artificial IV fluid hydration or artificial feeding at this point  Recent fungal infection The port was removed. Blood cultures are pending  Large pleural effusion with possible pneumonia, fever Could be due to third spacing versus metastatic cancer She is receiving broad-spectrum antibiotics I recommend discontinuation of IV antibiotics and transitioning her care to comfort measures  Protein-calorie malnutrition,severe(HCC) The cause of protein calorie malnutrition is due to poor oral intake and progression of cancer I do not recommend artificial hydration or nutritional supplementation  Left femoral vein DVT ([place]) I discussed extensively with her son I will recommend stopping anticoagulation therapy due to high risk of bleeding  Cancer associated pain She has moderate cancer pain, currently  controlled  Goals of care discussion The patient's mental health illness precluded meaningful discussion about goals of care Her son is her dedicated healthcare power of attorney I updated her son today of the plan I recommend transitioning her care to palliative care/hospice with comfort measures only Ultimately, he agree with the plan of care I will change her CODE STATUS to DNR   Discharge planning She may not survive this current hospitalization I recommend transitioning her care to comfort measures The son agrees Primary service is updated I recommend consultation with social worker to get her to residential hospice facility Prognosis is less than 2 weeks  Heath Lark, MD 09/11/2017  8:04 AM   Subjective:  Was notified of her admission.  Patient is currently in the stepdown unit due to presumed pneumonia. According to caregivers, the patient has persistent vaginal bleeding. 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 However, she was transferred to [facility] on 08/06/16 for suicidal ideation. There, GYN was consulted and ordered biopsy. Pt was transferred to medical unit for blood transfusion.       08/10/2016 Imaging    US pelvis:  The uterus is prominent and heterogeneous measuring 10.9 x 6.3 x 6.1 cm. There is a probable anterior exophytic fundal fibroid measuring 4.5 cm. The endometrial stripe is normal in thickness at 6.4 mm. I see no fluid in the canal  Right ovary is not visible. Left ovary 2.3 x 1.7 x 1.9 cm with normal echogenicity. Color flow and spectral Doppler: Normal arterial inflow  and venous outflow.  There is no significant free fluid.   I see no adnexal masses.       08/11/2016 - 08/15/2016 Hospital Admission    She was admitted voluntarily to Psychiatry unit 08/06/2016 for suicidal ideation.She was evaluated in an outside facility due to vaginal bleeding and endometrial and endocervical biopsies performed. Pathology revealed a poorly differentiated cancer with positive estrogen receptors felt to be suggestive of endometrial cancer. The patient received 2 units of prbcs total during her stay for anemia.        08/11/2016 Pathology Results       1. Endocervix, curettings and polyp. (*please see comment below). --High-grade carcinoma, undifferentiated.  2. Endometrial biopsy:(*please see comment below). --High-grade carcinoma, undifferentiated. --Weakly positive for estrogen receptors suggestive of endometrial origin; Negative for progesterone receptors and neutral mucin..       08/14/2016 Imaging    CT imaging 1. Uterine mass and abnormality consistent with clinical history of endometrial carcinoma as described above with bilateral pelvic and retroperitoneal adenopathy consistent with nodal metastasis. 2. No evidence of nonnodal distant metastasis. 3. Nonspecific incidental finding of hypertrophy of the pylorus muscle. 4. incidental finding of cholelithiasis      09/04/2016 Imaging    CT chest: 1. 4 mm nodule in the anterior aspect of the right upper lobe. This is highly nonspecific. Although metastatic disease is not excluded, it is not strongly favored. Attention under routine followup examinations is recommended to ensure the stability or resolution of this finding. 2. Trace right pleural effusion lying dependently is simple in appearance. 3. Mild cardiomegaly.      09/05/2016 Procedure    Successful placement of a right internal jugular approach power injectable Port-A-Cath. The catheter is ready for immediate use.      09/05/2016 Tumor Marker    Patient's tumor was tested for the following markers: CA125 Results of the tumor marker test revealed  1150      09/09/2016 - 12/30/2016 Chemotherapy    She received chemotherapy with carboplatin and Taxol x 6 cycles      09/30/2016 Tumor Marker    Patient's tumor was tested for the following markers: CA125 Results of the tumor marker test revealed 338.1      10/28/2016 Tumor Marker    Patient's tumor was tested for the following markers: CA125 Results of the tumor marker test revealed 210.9      11/08/2016 Imaging    CT scan abdomen and pelvis 1. Soft tissue nodularity throughout the omental and perihepatic fat worrisome for peritoneal carcinomatosis. No ascites. 2. No other definitive evidence of metastatic disease. There are prominent left pelvic sidewall and right inguinal lymph nodes. 3. Nonspecific findings in the uterus, likely related to treated tumor and fibroids. Prior pelvic imaging unavailable. 4. Cholelithiasis.      12/09/2016 Tumor Marker    Patient's tumor was tested for the following markers: CA125 Results of the tumor marker test revealed 98.1      12/30/2016 Tumor Marker    Patient's tumor was tested for the following markers: CA125 Results of the tumor marker test revealed 106.1      01/07/2017 Imaging    1. Stable soft tissue nodularity along the omentum suspicious for peritoneal spread of tumor. Stable appearance of nodularity along the uterine fundus and indistinctness along the right adnexa, some of this may be due to fibroids. 2. The previously borderline enlarged left external iliac lymph node is now normal in size. 3. Stable 4 mm ill-defined right upper  lobe pulmonary nodule anteriorly, this may well in depth being benign but merits surveillance. 4. Cholelithiasis.      03/03/2017 Tumor Marker    Patient's tumor was tested for the following markers: CA125 Results of the tumor marker test revealed 694.5      03/07/2017 Imaging    1. Clear interval progression of peritoneal and omental disease as above. Diffuse mesenteric thickening and nodularity has  progressed. 2. New juxta diaphragmatic lymph nodes compatible with metastatic disease. 3. Cholelithiasis. 4. Uterine enlargement with probable fibroid change. Ill-defined hypovascular lesion in the lower uterine segment/cervix may represent primary malignancy although this is difficult to assess by CT.      03/18/2017 Imaging    ECHO: Normal LV size with EF 55-60%. Normal RV size and systolic function. Mild MR.      06/06/2017 Tumor Marker    Patient's tumor was tested for the following markers: CA125 Results of the tumor marker test revealed 710.5      06/20/2017 Imaging    1. Slightly improved omental disease. Scattered peritoneal surface disease and mesenteric disease appears relatively stable. 2. New cystic lesion peripherally in the right hepatic lobe could be treated peritoneal surface disease. 3. Persistent extensive endometrial neoplasm involving the lower uterine segment and cervix. 4. Cholelithiasis. 5. Moderate-sized right effusion with overlying atelectasis but no enhancing pleural nodules or pulmonary nodules. 6. Deep venous thrombosis in the left common femoral vein.      06/20/2017 Imaging    LV EF: 60% -  65%      07/01/2017 Tumor Marker    Patient's tumor was tested for the following markers: CA125 Results of the tumor marker test revealed 493.4      08/01/2017 Tumor Marker    Patient's tumor was tested for the following markers: CA125 Results of the tumor marker test revealed 955.6      08/27/2017 Tumor Marker    Patient's tumor was tested for the following markers: CA125 Results of the tumor marker test revealed 687      08/27/2017 Imaging    Increased size of two 1 cm low-attenuation lesions along the capsular surface of the liver dome, suspicious for peritoneal carcinoma. Other areas of soft tissue density in the omental fat are unchanged, consistent with peritoneal carcinomatosis.  No significant change in soft tissue mass in the lower uterine segment and  cervix, with probable adjacent extra-uterine extension.  Increased bilateral pleural effusions, mild ascites, and diffuse body wall edema.  Stable tiny indeterminate sub-cm bilateral upper lobe pulmonary nodules. Recommend continued follow-up by chest CT in 6 months.  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  Cholelithiasis.  No radiographic evidence of cholecystitis.        Objective:  Vitals:   09/11/17 0600 09/11/17 0700  BP: 116/60 104/65  Pulse:    Resp: (!) 21 20  Temp:    SpO2: 100% 100%     Intake/Output Summary (Last 24 hours) at 09/11/2017 0804 Last data filed at 09/11/2017 0600 Gross per 24 hour  Intake 1480 ml  Output 300 ml  Net 1180 ml    GENERAL:not alert, opened her eyes intermittently SKIN: skin color is pale, texture, turgor are normal, no rashes or significant lesions EYES: normal, Conjunctiva are pale and non-injected, sclera clear LUNGS: reduced breath sounds throughout with increased breathing effort HEART: tachycardia, no murmurs and no lower extremity edema ABDOMEN:abdomen soft, mildly distended NEURO: not alert or oriented  Labs:  Lab Results  Component Value  Date   WBC 12.6 (H) 09/11/2017   HGB 7.2 (L) 09/11/2017   HCT 22.7 (L) 09/11/2017   MCV 92.3 09/11/2017   PLT 283 09/11/2017   NEUTROABS 12 09/09/2017   NEUTROABS 12 09/09/2017    Lab Results  Component Value Date   NA 135 09/11/2017   K 3.7 09/11/2017   CL 97 (L) 09/11/2017   CO2 26 09/11/2017    Studies:  Dg Chest 2 View  Result Date: 09/10/2017 CLINICAL DATA:  Shortness of breath and tachycardia EXAM: CHEST - 2 VIEW COMPARISON:  None. FINDINGS: Right-sided PICC line tip is in the lower SVC. There is a small right pleural effusion with hazy opacities throughout the right lung. Cardiomediastinal contours are normal. Left lung is clear. IMPRESSION: Medium-sized right pleural effusion with associated atelectasis. Electronically Signed   By: Ulyses Jarred  M.D.   On: 09/10/2017 17:14

## 2017-09-11 NOTE — Discharge Summary (Signed)
Physician Discharge Summary  Emily Duarte KDT:[id number] DOB: 03-07-1951 DOA: 09/10/2017  PCP: Nolene Ebbs, MD  Admit date: 09/10/2017 Discharge date: 09/11/2017  Admitted From: Home Disposition: Residential hospice  Discharge Condition:Hospice CODE STATUS: Comfort Care Diet recommendation: Regular   Brief/Interim Summary:  Patient is a 67 year old female with past medical history of hypertension, hyperlipidemia, metastatic endometrial cancer, left femoral DVT on Xarelto, adrenal insufficiency who presented to the emergency department with complaints of cough and shortness of breath from nursing facility.  Patient was found to be altered and unable to give any history on presentation.  It was reported that, chest x-ray was a history of pneumonia in the nursing facility.  Patient follows with oncology Dr. Alvy Bimler for metastatic endometrial cancer.  Patient CT imaging of chest/abdomen/pelvis done earlier this month showed increased size of liver lesions with suspicion of adrenal carcinomatosis. Patient has been evaluated by oncology here and she has been transitioned to comfort care only.  Social worker consulted for arranging discharge to residential hospice.  She is being discharged today to residential hospice.  Current problems:  Endometrial/uterine adenocarcinoma: Last chemotherapy on 08/01/17.  There is no plan for further chemotherapy and the patient is on comfort care now.  Anemia: Chronic.  Due to recurrent vaginal bleeding.  No transfusion will be offered at this time.  Acute renal failure: Most likely secondary to poor oral intake and dehydration.  IV fluids will be discontinued.  Recent fungal infection: Status post removal of the port.  Large pleural effusion with possible pneumonia, fever on presentation: She was started on broad-spectrum antibiotics.  At this point will discontinue antibiotics.  Severe protein calorie malnutrition: Secondary to poor oral  intake and progression of cancer.  Left femoral vein DVT: Anticoagulation has been stopped  Cancer associated pain: Continue morphine as needed.      Discharge Diagnoses:  Principal Problem:   Sepsis ([place]) Active Problems:   Endometrial/uterine adenocarcinoma ([place])   Anemia, chronic disease   Schizoaffective disorder, depressive type ([facility])   Pleural effusion   Acute encephalopathy   Acute kidney injury superimposed on chronic kidney disease ([place])   HCAP (healthcare-associated pneumonia)   Acute respiratory distress    Discharge Instructions   Allergies as of 09/11/2017   No Known Allergies     Medication List    STOP taking these medications   dexamethasone 2 MG tablet Commonly known as:  DECADRON   divalproex 500 MG 24 hr tablet Commonly known as:  DEPAKOTE ER   ENSURE   magnesium oxide 400 (241.3 Mg) MG tablet Commonly known as:  MAG-OX   Melatonin 3 MG Tabs   ondansetron 8 MG tablet Commonly known as:  ZOFRAN   prochlorperazine 10 MG tablet Commonly known as:  COMPAZINE   risperiDONE 0.5 MG tablet Commonly known as:  RISPERDAL       No Known Allergies  Consultations: Oncology  Procedures/Studies: Dg Chest 2 View  Result Date: 09/10/2017 CLINICAL DATA:  Shortness of breath and tachycardia EXAM: CHEST - 2 VIEW COMPARISON:  None. FINDINGS: Right-sided PICC line tip is in the lower SVC. There is a small right pleural effusion with hazy opacities throughout the right lung. Cardiomediastinal contours are normal. Left lung is clear. IMPRESSION: Medium-sized right pleural effusion with associated atelectasis. Electronically Signed   By: Ulyses Jarred M.D.   On: 09/10/2017 17:14   Dg Chest 2 View  Result Date: 08/17/2017 CLINICAL DATA:  Hypotension today. Undergoing chemotherapy for endometrial cancer. EXAM: CHEST - 2 VIEW  COMPARISON:  08/11/2017 FINDINGS: Right IJ Port-A-Cath is present with tip over the right atrium unchanged. Patient is rotated  to the left. Lungs are adequately inflated with hazy opacification over the right mid to lower lung slightly worse compatible with moderate size effusion with associated atelectasis. Slight worsened left base opacification compatible with small effusion with atelectasis. Cardiomediastinal silhouette and remainder the exam is unchanged. IMPRESSION: Worsening moderate size right effusion and worsening small left effusion likely with associated bibasilar atelectasis. Right IJ Port-A-Cath unchanged with tip over the right atrium. Electronically Signed   By: Marin Olp M.D.   On: 08/17/2017 16:06   Ct Chest W Contrast  Result Date: 08/27/2017 CLINICAL DATA:  Endometrial adenocarcinoma. Elevated tumor markers. Progressive weight loss. Undergoing chemotherapy. EXAM: CT CHEST, ABDOMEN, AND PELVIS WITH CONTRAST TECHNIQUE: Multidetector CT imaging of the chest, abdomen and pelvis was performed following the standard protocol during bolus administration of intravenous contrast. CONTRAST:  129mL OMNIPAQUE IOHEXOL 300 MG/ML SOLN, 22mL OMNIPAQUE IOHEXOL 300 MG/ML SOLN COMPARISON:  06/20/2017 FINDINGS: CT CHEST FINDINGS Cardiovascular: No acute findings. Mediastinum/Lymph Nodes: No masses or pathologically enlarged lymph nodes identified. Lungs/Pleura: Increased size of moderate right and small left pleural effusions since previous study. Increased compressive atelectasis in both lower lobes. A few scattered sub-cm pulmonary nodules in both upper lobes remain stable. No new or enlarging pulmonary nodules or masses identified. Musculoskeletal:  No suspicious bone lesions identified. CT ABDOMEN AND PELVIS FINDINGS Hepatobiliary: New approximately 1 cm low-attenuation lesions are seen along the capsular surface the liver dome, suspicious for capsular tumor implants. Subcapsular cyst in right hepatic lobe is stable. Multiple calcified gallstones are seen, without evidence of cholecystitis or biliary ductal dilatation. Mild  periportal edema is again seen. Pancreas:  No mass or inflammatory changes. Spleen:  Within normal limits in size and appearance. Adrenals/Urinary tract:  No masses or hydronephrosis. Stomach/Bowel: No evidence of obstruction, inflammatory process, or abnormal fluid collections. Vascular/Lymphatic: No pathologically enlarged lymph nodes identified. No abdominal aortic aneurysm. 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 Reproductive: Heterogeneous soft tissue mass involving the uterine body and cervix shows no significant change in size since previous study. This measures 6.2 x 4.0 cm on image 105/2, compared to 6.1 x 4.3 cm previously. Deep myometrial invasion is seen, with probable adjacent extra uterine extension. Adnexal regions are unremarkable. Other: Mild ascites is increased since previous study. Soft tissue thickening and nodularity involving the omental fat appears stable, consistent with peritoneal carcinoma. Increased diffuse body wall edema also noted. Musculoskeletal:  No suspicious bone lesions identified. IMPRESSION: Increased size of two 1 cm low-attenuation lesions along the capsular surface of the liver dome, suspicious for peritoneal carcinoma. Other areas of soft tissue density in the omental fat are unchanged, consistent with peritoneal carcinomatosis. No significant change in soft tissue mass in the lower uterine segment and cervix, with probable adjacent extra-uterine extension. Increased bilateral pleural effusions, mild ascites, and diffuse body wall edema. Stable tiny indeterminate sub-cm bilateral upper lobe pulmonary nodules. Recommend continued follow-up by chest CT in 6 months. 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 Cholelithiasis.  No radiographic evidence of cholecystitis. Electronically Signed   By: Earle Gell M.D.   On: 08/27/2017 11:51   Ct Abdomen Pelvis W Contrast  Result Date: 08/27/2017 CLINICAL DATA:  Endometrial adenocarcinoma.  Elevated tumor markers. Progressive weight loss. Undergoing chemotherapy. EXAM: CT CHEST, ABDOMEN, AND PELVIS WITH CONTRAST TECHNIQUE: Multidetector CT imaging of the chest, abdomen and pelvis was  performed following the standard protocol during bolus administration of intravenous contrast. CONTRAST:  165mL OMNIPAQUE IOHEXOL 300 MG/ML SOLN, 23mL OMNIPAQUE IOHEXOL 300 MG/ML SOLN COMPARISON:  06/20/2017 FINDINGS: CT CHEST FINDINGS Cardiovascular: No acute findings. Mediastinum/Lymph Nodes: No masses or pathologically enlarged lymph nodes identified. Lungs/Pleura: Increased size of moderate right and small left pleural effusions since previous study. Increased compressive atelectasis in both lower lobes. A few scattered sub-cm pulmonary nodules in both upper lobes remain stable. No new or enlarging pulmonary nodules or masses identified. Musculoskeletal:  No suspicious bone lesions identified. CT ABDOMEN AND PELVIS FINDINGS Hepatobiliary: New approximately 1 cm low-attenuation lesions are seen along the capsular surface the liver dome, suspicious for capsular tumor implants. Subcapsular cyst in right hepatic lobe is stable. Multiple calcified gallstones are seen, without evidence of cholecystitis or biliary ductal dilatation. Mild periportal edema is again seen. Pancreas:  No mass or inflammatory changes. Spleen:  Within normal limits in size and appearance. Adrenals/Urinary tract:  No masses or hydronephrosis. Stomach/Bowel: No evidence of obstruction, inflammatory process, or abnormal fluid collections. Vascular/Lymphatic: No pathologically enlarged lymph nodes identified. No abdominal aortic aneurysm. 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 Reproductive: Heterogeneous soft tissue mass involving the uterine body and cervix shows no significant change in size since previous study. This measures 6.2 x 4.0 cm on image 105/2, compared to 6.1 x 4.3 cm previously. Deep myometrial invasion is seen, with  probable adjacent extra uterine extension. Adnexal regions are unremarkable. Other: Mild ascites is increased since previous study. Soft tissue thickening and nodularity involving the omental fat appears stable, consistent with peritoneal carcinoma. Increased diffuse body wall edema also noted. Musculoskeletal:  No suspicious bone lesions identified. IMPRESSION: Increased size of two 1 cm low-attenuation lesions along the capsular surface of the liver dome, suspicious for peritoneal carcinoma. Other areas of soft tissue density in the omental fat are unchanged, consistent with peritoneal carcinomatosis. No significant change in soft tissue mass in the lower uterine segment and cervix, with probable adjacent extra-uterine extension. Increased bilateral pleural effusions, mild ascites, and diffuse body wall edema. Stable tiny indeterminate sub-cm bilateral upper lobe pulmonary nodules. Recommend continued follow-up by chest CT in 6 months. 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 Cholelithiasis.  No radiographic evidence of cholecystitis. Electronically Signed   By: Earle Gell M.D.   On: 08/27/2017 11:51   Ir Removal [employer] W/ [place] W/o Fl Mod Sed  Result Date: 08/27/2017 INDICATION: 67 year old female with a history port catheter removal secondary to fungemia EXAM: REMOVAL RIGHT IJ VEIN PORT-A-CATH MEDICATIONS: 2.0 g Ancef; The antibiotic was administered within an appropriate time interval prior to skin puncture. ANESTHESIA/SEDATION: Moderate (conscious) sedation was employed during this procedure. A total of Versed 1.0 mg and Fentanyl 50 mcg was administered intravenously. Moderate Sedation Time: 10 minutes. The patient's level of consciousness and vital signs were monitored continuously by radiology nursing throughout the procedure under my direct supervision. FLUOROSCOPY TIME:  Fluoroscopy none COMPLICATIONS: None PROCEDURE: Informed consent was obtained from the patient following an  explanation of the procedure, risks, benefits and alternatives. The patient understands, agrees and consents for the procedure. All questions were addressed. A time out was performed prior to the initiation of the procedure. The patient was positioned in the operation suite in the supine position on a gantry. The previous scar on the right chest was generously infiltrated with 1% lidocaine for local anesthesia. Infiltration of the skin and subcutaneous tissues surrounding the port was  performed. Using sharp and blunt dissection, the port apparatus and subcutaneous catheter were removed in their entirety. The port pocket was then closed with interrupted Vicryl layer and a running subcuticular with 4-0 Monocryl. The skin was sealed with Derma bond. A sterile dressing was placed. The patient tolerated the procedure well and remained hemodynamically stable throughout. No complications were encountered and no significant blood loss was encountered. IMPRESSION: Status post removal of right IJ port catheter. Signed, Dulcy Fanny. Earleen Newport, DO [facility] Electronically Signed   By: Corrie Mckusick D.O.   On: 08/27/2017 09:08   Dg Abd Portable 1v  Result Date: 08/17/2017 CLINICAL DATA:  Abdominal distention. EXAM: PORTABLE ABDOMEN - 1 VIEW COMPARISON:  CT abdomen pelvis dated June 20, 2017. FINDINGS: The bowel gas pattern is normal. Moderate colonic stool burden, unchanged. No radio-opaque calculi or other significant radiographic abnormality are seen. Multiple phleboliths in the pelvis. No acute osseous abnormality. IMPRESSION: 1. Prominent stool throughout the colon favors constipation. No obstruction. Electronically Signed   By: Titus Dubin M.D.   On: 08/17/2017 17:44   [place] Ekg Site Rite  Result Date: 08/30/2017 If Site Rite image not attached, placement could not be confirmed due to current cardiac rhythm.     Subjective: Patient seen and examined the  bedside this morning.  Discussed with family at the bedside.  Patient looks severely ill and at the end of her life.  Discharge Exam: Vitals:   09/11/17 0700 09/11/17 0800  BP: 104/65 119/64  Pulse:    Resp: 20 (!) 21  Temp:  (!) 97.5 F (36.4 C)  SpO2: 100% 100%   Vitals:   09/11/17 0500 09/11/17 0600 09/11/17 0700 09/11/17 0800  BP: 132/74 116/60 104/65 119/64  Pulse:      Resp: (!) 23 (!) 21 20 (!) 21  Temp:    (!) 97.5 F (36.4 C)  TempSrc:    Oral  SpO2: 92% 100% 100% 100%  Weight:      Height:        General: Pt is not alert, awak Cardiovascular: RRR, S1/S2 +, no rubs, no gallops Respiratory: Decreased air entry bilaterally Abdominal: Soft, NT, ND, bowel sounds + Extremities: no edema, no cyanosis, cool peripheries    The results of significant diagnostics from this hospitalization (including imaging, microbiology, ancillary and laboratory) are listed below for reference.     Microbiology: Recent Results (from the past 240 hour(s))  Culture, blood (routine x 2)     Status: None (Preliminary result)   Collection Time: 09/10/17  4:50 PM  Result Value Ref Range Status   Specimen Description   Final    BLOOD LEFT ANTECUBITAL Performed at [facility], [place] [address], [place], [place] [address]    Special Requests   Final    BOTTLES DRAWN AEROBIC AND ANAEROBIC Blood Culture results may not be optimal due to an inadequate volume of blood received in culture bottles Performed at [facility] [address], [place], [place] [address]    Culture   Final    NO GROWTH < 24 HOURS Performed at [facility] [address] [place], [place] [address]    Report Status PENDING  Incomplete  MRSA PCR Screening     Status: Abnormal   Collection Time: 09/10/17 10:10 PM  Result Value Ref Range Status   MRSA by PCR POSITIVE (A) NEGATIVE Final    Comment:  The GeneXpert MRSA Assay (FDA approved for NASAL specimens only),  is one component of a comprehensive MRSA colonization surveillance program. It is not intended to diagnose MRSA infection nor to guide or monitor treatment for MRSA infections. RESULT CALLED TO, READ BACK BY AND VERIFIED WITH: FRANKLIN,S @ [id number] EG [id number] BY POTEAT,S Performed at [facility] [address] [place], [place] [address]      Labs: BNP (last 3 results) No results for input(s): BNP in the last 8760 hours. Basic Metabolic Panel: Recent Labs  Lab 09/09/17 09/10/17 1529 09/11/17 0603  NA 132*  134* 136 135  K 4.3  4.4 4.1 3.7  CL  --  92* 97*  CO2  --  30 26  GLUCOSE  --  105* 97  BUN 32*  34* 39* 38*  CREATININE 1.6*  1.7* 1.86* 1.68*  CALCIUM  --  8.7* 8.6*   Liver Function Tests: Recent Labs  Lab 09/09/17 09/10/17 1528  AST 39* 34  ALT 23 20  ALKPHOS 248* 179*  BILITOT  --  0.4  PROT  --  6.6  ALBUMIN  --  1.7*   No results for input(s): LIPASE, AMYLASE in the last 168 hours. No results for input(s): AMMONIA in the last 168 hours. CBC: Recent Labs  Lab 09/09/17 09/10/17 1529 09/11/17 0603  WBC 16.6  15.2 13.0* 12.6*  NEUTROABS 12  12  --   --   HGB 8.9*  8.4* 8.0* 7.2*  HCT 27*  26* 25.4* 22.7*  MCV  --  92.4 92.3  PLT 436*  426* 357 283   Cardiac Enzymes: No results for input(s): CKTOTAL, CKMB, CKMBINDEX, TROPONINI in the last 168 hours. BNP: Invalid input(s): POCBNP CBG: Recent Labs  Lab 09/10/17 2313  GLUCAP 100*   D-Dimer No results for input(s): DDIMER in the last 72 hours. Hgb A1c No results for input(s): HGBA1C in the last 72 hours. Lipid Profile No results for input(s): CHOL, HDL, LDLCALC, TRIG, CHOLHDL, LDLDIRECT in the last 72 hours. Thyroid function studies No results for input(s): TSH, T4TOTAL, T3FREE, THYROIDAB in the last 72 hours.  Invalid input(s): FREET3 Anemia work up No results for input(s): VITAMINB12, FOLATE, FERRITIN, TIBC, IRON, RETICCTPCT in the last 72 hours. Urinalysis     Component Value Date/Time   COLORURINE YELLOW 09/10/2017 1745   APPEARANCEUR HAZY (A) 09/10/2017 1745   LABSPEC 1.017 09/10/2017 1745   PHURINE 5.0 09/10/2017 1745   GLUCOSEU NEGATIVE 09/10/2017 1745   HGBUR LARGE (A) 09/10/2017 1745   BILIRUBINUR NEGATIVE 09/10/2017 1745   KETONESUR NEGATIVE 09/10/2017 1745   PROTEINUR 100 (A) 09/10/2017 1745   UROBILINOGEN 0.2 07/12/2009 1846   NITRITE NEGATIVE 09/10/2017 1745   LEUKOCYTESUR SMALL (A) 09/10/2017 1745   Sepsis Labs Invalid input(s): PROCALCITONIN,  WBC,  LACTICIDVEN Microbiology Recent Results (from the past 240 hour(s))  Culture, blood (routine x 2)     Status: None (Preliminary result)   Collection Time: 09/10/17  4:50 PM  Result Value Ref Range Status   Specimen Description   Final    BLOOD LEFT ANTECUBITAL Performed at [facility], [place] [address] [place], [place] [address]    Special Requests   Final    BOTTLES DRAWN AEROBIC AND ANAEROBIC Blood Culture results may not be optimal due to an inadequate volume of blood received in culture bottles Performed at [place] [address], [place], [place] [address]    Culture   Final  NO GROWTH < 24 HOURS Performed at [facility] [address], [place], [place] [address]    Report Status PENDING  Incomplete  MRSA PCR Screening     Status: Abnormal   Collection Time: 09/10/17 10:10 PM  Result Value Ref Range Status   MRSA by PCR POSITIVE (A) NEGATIVE Final    Comment:        The GeneXpert MRSA Assay (FDA approved for NASAL specimens only), is one component of a comprehensive MRSA colonization surveillance program. It is not intended to diagnose MRSA infection nor to guide or monitor treatment for MRSA infections. RESULT CALLED TO, READ BACK BY AND VERIFIED WITH: FRANKLIN,S @ [id number] YS [id number] BY POTEAT,S Performed at [facility] [address] [place], [place] [address]     Please note: You  were cared for by a hospitalist during your hospital stay. Once you are discharged, your primary care physician will handle any further medical issues. Please note that NO REFILLS for any discharge medications will be authorized once you are discharged, as it is imperative that you return to your primary care physician (or establish a relationship with a primary care physician if you do not have one) for your post hospital discharge needs so that they can reassess your need for medications and monitor your lab values.    Time coordinating discharge: 40 minutes  SIGNED:   Shelly Coss, MD  [facility] 09/11/2017, 2:00 PM Pager [phone]  If 7PM-7AM, please contact night-coverage [email or web address] Password [email or web address]

## 2017-09-11 NOTE — Progress Notes (Signed)
I met with the patient's family again. Her son, daughter-in-law and niece were present I discussed recent CT findings, overall medical condition and general decline in performance status and explained to them why further treatment or other form of supportive care will not improve her longevity or quality of life Overall, they are in agreement with the plan of care to transition her to comfort measures and eventual placement at [facility] place I have addressed all their questions and concerns

## 2017-09-11 NOTE — Progress Notes (Addendum)
PROGRESS NOTE    Emily Duarte  HUD:[id number] DOB: [birth year] DOA: 09/10/2017 PCP: Nolene Ebbs, MD   Brief Narrative: Patient is a 67 year old female with past medical history of hypertension, hyperlipidemia, metastatic endometrial cancer, left femoral DVT on Xarelto, adrenal insufficiency who presented to the emergency department with complaints of cough and shortness of breath from nursing facility.  Patient was found to be altered and unable to give any history on presentation.  It was reported that, chest x-ray was a history of pneumonia in the nursing facility.  Patient follows with oncology Dr. Alvy Bimler for metastatic endometrial cancer.  Patient CT imaging of chest/abdomen/pelvis done earlier this month showed increased size of liver lesions with suspicion of adrenal carcinomatosis. Patient has been evaluated by oncology here and she has been transitioned to comfort care only.  Social worker consulted for arranging discharge to residential hospice.  Assessment & Plan:   Principal Problem:   Sepsis ([place]) Active Problems:   Endometrial/uterine adenocarcinoma ([facility])   Anemia, chronic disease   Schizoaffective disorder, depressive type (H[facility])   Pleural effusion   Acute encephalopathy   Acute kidney injury superimposed on chronic kidney disease ([place])   HCAP (healthcare-associated pneumonia)   Acute respiratory distress  Endometrial/uterine adenocarcinoma: Last chemotherapy on 08/01/17.  There is no plan for further chemotherapy and the patient is on comfort care now.  Anemia: Chronic.  Due to recurrent vaginal bleeding.  No transfusion will be offered at this time.  Acute renal failure: Most likely secondary to poor oral intake and dehydration.  IV fluids will be discontinued.  Recent fungal infection: Status post removal of the port.  Large pleural effusion with possible pneumonia, fever on presentation: She was started on broad-spectrum antibiotics.  At this point will  discontinue antibiotics.  Severe protein calorie malnutrition: Secondary to poor oral intake and progression of cancer.  Left femoral vein DVT: Anticoagulation has been stopped  Cancer associated pain: Continue morphine as needed.   DVT prophylaxis: None Code Status: DNR, comfort care Family Communication: None present at the bedside Disposition Plan: Residential hospice as soon as  the bed is available   Consultants: Oncology  Procedures: None  Antimicrobials:None  Subjective: Patient seen and examined the bedside this morning.  Appears severely weak and lethargic.  Hardly can open her eyes.  Moaning with pain and answers questions without opening her eyes.  Objective: Vitals:   09/11/17 0500 09/11/17 0600 09/11/17 0700 09/11/17 0800  BP: 132/74 116/60 104/65 119/64  Pulse:      Resp: (!) 23 (!) 21 20 (!) 21  Temp:    (!) 97.5 F (36.4 C)  TempSrc:    Oral  SpO2: 92% 100% 100% 100%  Weight:      Height:        Intake/Output Summary (Last 24 hours) at 09/11/2017 1059 Last data filed at 09/11/2017 0600 Gross per 24 hour  Intake 1480 ml  Output 300 ml  Net 1180 ml   Filed Weights   09/10/17 1734  Weight: 60.8 kg (134 lb)    Examination:  General exam: Very weak, chronically ill HEENT:PERRL,Oral mucosa dry, Ear/Nose normal on gross exam Respiratory system: Bilateral decreased air entry Cardiovascular system: S1 & S2 heard, RRR. No JVD, murmurs, rubs, gallops or clicks. No pedal edema. Gastrointestinal system: Abdomen is nondistended, soft and nontender. No organomegaly or masses felt. Normal bowel sounds heard. Central nervous system: Not Alert and oriented.  Extremities: No edema, no clubbing ,no cyanosis, distal peripheral pulses palpable.  Cool peripheries Skin: No rashes, lesions or ulcers,no icterus ,no pallor   Data Reviewed: I have personally reviewed following labs and imaging studies  CBC: Recent Labs  Lab 09/09/17 09/10/17 1529 09/11/17 0603    WBC 16.6  15.2 13.0* 12.6*  NEUTROABS 12  12  --   --   HGB 8.9*  8.4* 8.0* 7.2*  HCT 27*  26* 25.4* 22.7*  MCV  --  92.4 92.3  PLT 436*  426* 357 166   Basic Metabolic Panel: Recent Labs  Lab 09/09/17 09/10/17 1529 09/11/17 0603  NA 132*  134* 136 135  K 4.3  4.4 4.1 3.7  CL  --  92* 97*  CO2  --  30 26  GLUCOSE  --  105* 97  BUN 32*  34* 39* 38*  CREATININE 1.6*  1.7* 1.86* 1.68*  CALCIUM  --  8.7* 8.6*   GFR: Estimated Creatinine Clearance: 28.8 mL/min (A) (by C-G formula based on SCr of 1.68 mg/dL (H)). Liver Function Tests: Recent Labs  Lab 09/09/17 09/10/17 1528  AST 39* 34  ALT 23 20  ALKPHOS 248* 179*  BILITOT  --  0.4  PROT  --  6.6  ALBUMIN  --  1.7*   No results for input(s): LIPASE, AMYLASE in the last 168 hours. No results for input(s): AMMONIA in the last 168 hours. Coagulation Profile: No results for input(s): INR, PROTIME in the last 168 hours. Cardiac Enzymes: No results for input(s): CKTOTAL, CKMB, CKMBINDEX, TROPONINI in the last 168 hours. BNP (last 3 results) No results for input(s): PROBNP in the last 8760 hours. HbA1C: No results for input(s): HGBA1C in the last 72 hours. CBG: Recent Labs  Lab 09/10/17 2313  GLUCAP 100*   Lipid Profile: No results for input(s): CHOL, HDL, LDLCALC, TRIG, CHOLHDL, LDLDIRECT in the last 72 hours. Thyroid Function Tests: No results for input(s): TSH, T4TOTAL, FREET4, T3FREE, THYROIDAB in the last 72 hours. Anemia Panel: No results for input(s): VITAMINB12, FOLATE, FERRITIN, TIBC, IRON, RETICCTPCT in the last 72 hours. Sepsis Labs: Recent Labs  Lab 09/10/17 1541 09/10/17 1835 09/10/17 2032  PROCALCITON  --   --  1.47  LATICACIDVEN 2.58* 1.68  --     Recent Results (from the past 240 hour(s))  MRSA PCR Screening     Status: Abnormal   Collection Time: 09/10/17 10:10 PM  Result Value Ref Range Status   MRSA by PCR POSITIVE (A) NEGATIVE Final    Comment:        The GeneXpert MRSA Assay  (FDA approved for NASAL specimens only), is one component of a comprehensive MRSA colonization surveillance program. It is not intended to diagnose MRSA infection nor to guide or monitor treatment for MRSA infections. RESULT CALLED TO, READ BACK BY AND VERIFIED WITH: FRANKLIN,S @ 0630 ZS [id number] BY POTEAT,S Performed at [facility] [address], [place], [place] [address]          Radiology Studies: Dg Chest 2 View  Result Date: 09/10/2017 CLINICAL DATA:  Shortness of breath and tachycardia EXAM: CHEST - 2 VIEW COMPARISON:  None. FINDINGS: Right-sided PICC line tip is in the lower SVC. There is a small right pleural effusion with hazy opacities throughout the right lung. Cardiomediastinal contours are normal. Left lung is clear. IMPRESSION: Medium-sized right pleural effusion with associated atelectasis. Electronically Signed   By: Ulyses Jarred M.D.   On: 09/10/2017 17:14        Scheduled Meds: . dexamethasone  2 mg Oral Daily  . divalproex  500 mg Oral QHS  . feeding supplement (ENSURE ENLIVE)  237 mL Oral BID BM  . guaiFENesin  600 mg Oral BID  . Melatonin  1.5 mg Oral QHS  . risperiDONE  0.5 mg Oral Daily   Continuous Infusions:   LOS: 1 day    Time spent: 35 mins.More than 50% of that time was spent in counseling and/or coordination of care.      Shelly Coss, MD [facility] Pager [phone]  If 7PM-7AM, please contact night-coverage [email or web address] Password [facility] 09/11/2017, 10:59 AM

## 2017-09-11 NOTE — Progress Notes (Signed)
[facility] Liaison RN visit  Received request from [place], [place] for family interest in [place] with request for transfer today. Chart reviewed. Met with son, Albertina Parr to confirm interest and explain services. Family agreeable to transfer today. CSW aware. Registration paperwork completed. Dr. Orpah Melter to assume care per family request. Please fax discharge summary to [phone]. RN please call report to [phone]. Please arrange transportation for patient to arrive as soon as possible.   Thank you,  Farrel Gordon, RN, [facility] Liaison [phone]  [facility] hospital liaisons are on [place]

## 2017-09-11 NOTE — Progress Notes (Addendum)
Consult- Residential Hospice  Patient family agreeable to [facility]. CSW called Velta Addison w/ [facility] and provided verbal referral. She follow up with patient Son and will complete paperwork today. Patient will transport to [facility] today. CSW informed the physician to complete discharge summary and sign DNR.   2:52pm D/C Summary sent.  DNR signed.  Nurse given number to call report.  [facility] arranged for transport.   Kathrin Greathouse, Latanya Presser, MSW Clinical Social Worker  [phone] 09/11/2017  2:03 PM

## 2017-09-15 LAB — CULTURE, BLOOD (ROUTINE X 2)
CULTURE: NO GROWTH
Culture: NO GROWTH
SPECIAL REQUESTS: ADEQUATE

## 2017-09-20 DEATH — deceased

## 2017-09-25 ENCOUNTER — Inpatient Hospital Stay: Payer: Self-pay | Admitting: Infectious Disease

## 2018-09-25 IMAGING — CT CT CHEST W/ CM
2 of 5 series · 13 of 36 positions shown, 16 images · IV contrast (APPLIED)
Comparison: 06/20/2017

CLINICAL DATA: Endometrial adenocarcinoma. Elevated tumor markers.
Progressive weight loss. Undergoing chemotherapy.

EXAM:
CT CHEST, ABDOMEN, AND PELVIS WITH CONTRAST
TECHNIQUE: Multidetector CT imaging of the chest, abdomen and pelvis was
performed following the standard protocol during bolus
administration of intravenous contrast.
CONTRAST:  100mL OMNIPAQUE IOHEXOL 300 MG/ML SOLN, 30mL OMNIPAQUE
IOHEXOL 300 MG/ML SOLN

[Series 2: cap with · axial · 0.88mm/px · z∈[-332,+183]mm · 10 of 127 slices shown, 13 images]
[im 12/127  mediastinal]
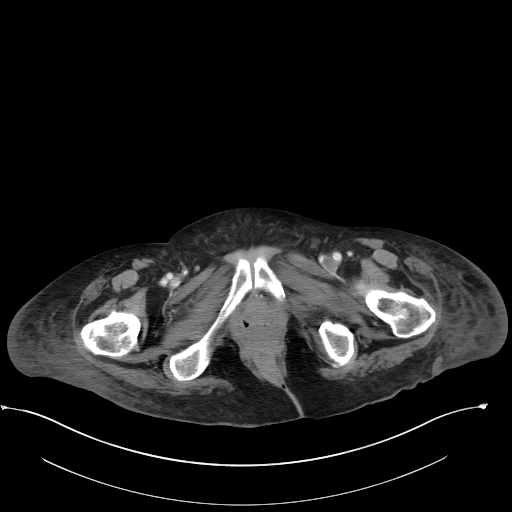
[im 12/127  lung]
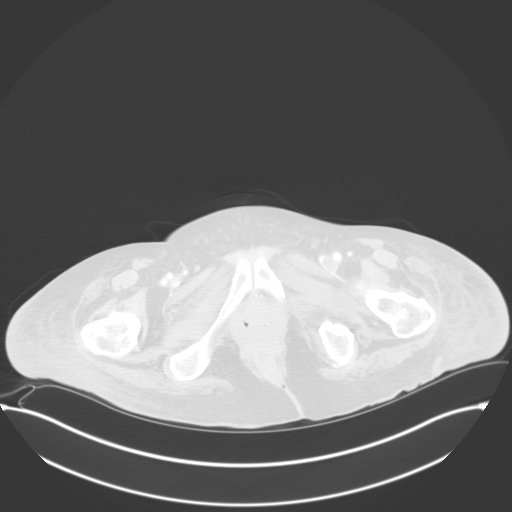
[im 23/127  lung]
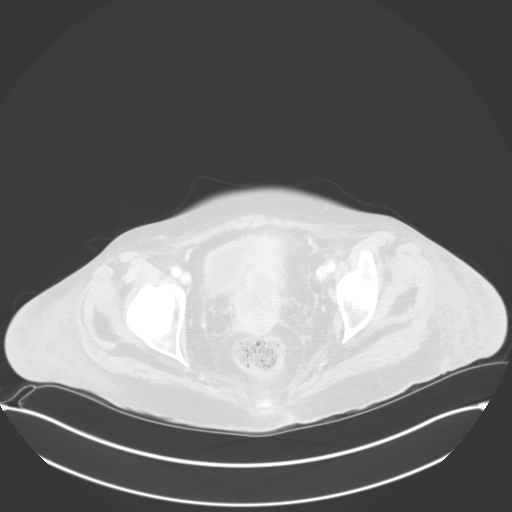
[im 35/127  lung]
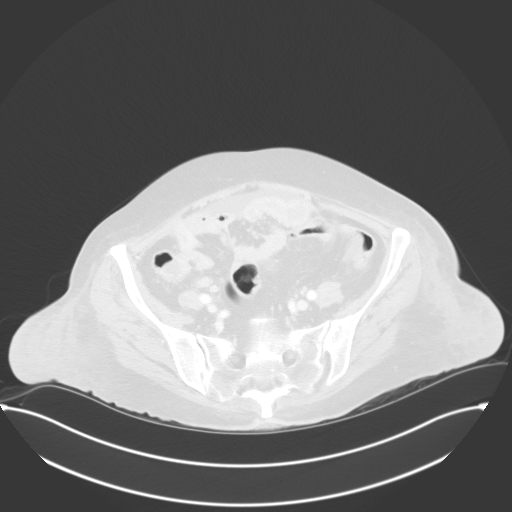
[im 46/127  lung]
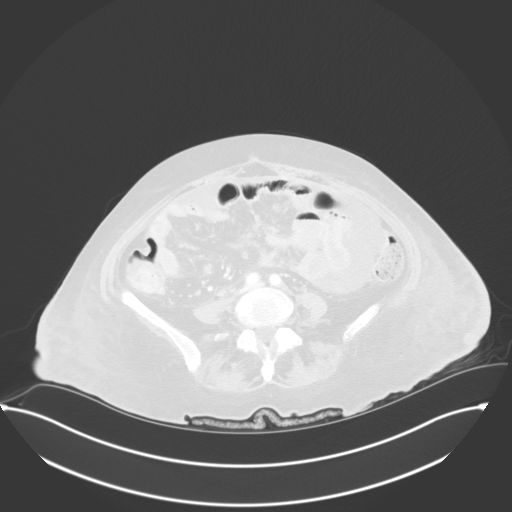
[im 58/127  mediastinal]
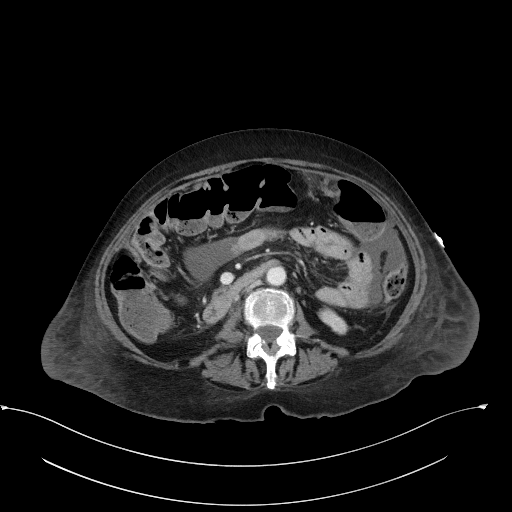
[im 58/127  lung]
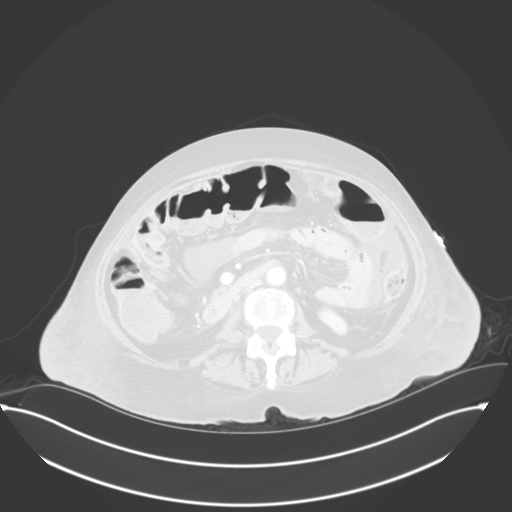
[im 69/127  lung]
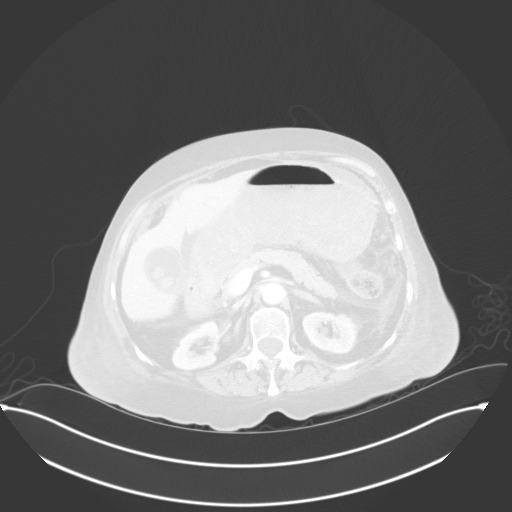
[im 81/127  lung]
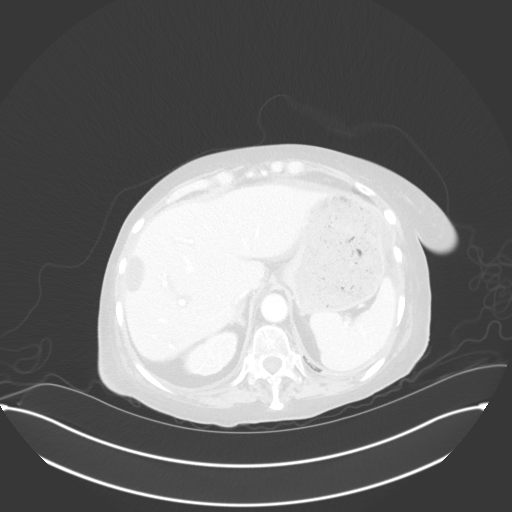
[im 92/127  lung]
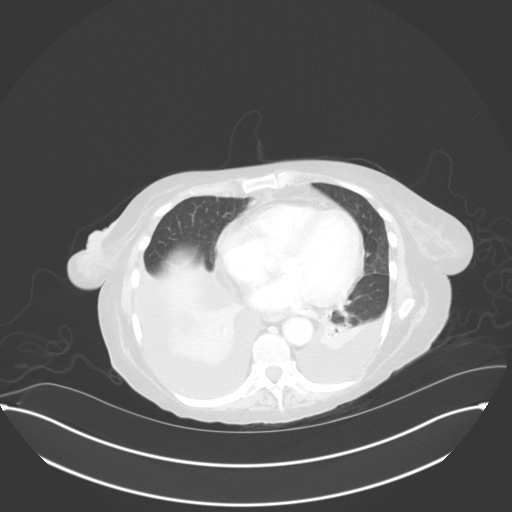
[im 104/127  mediastinal]
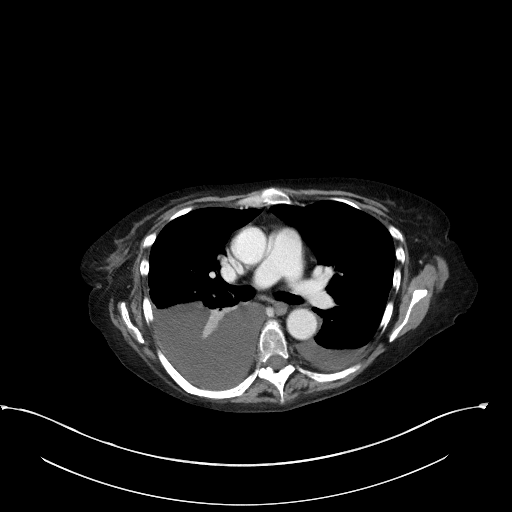
[im 104/127  lung]
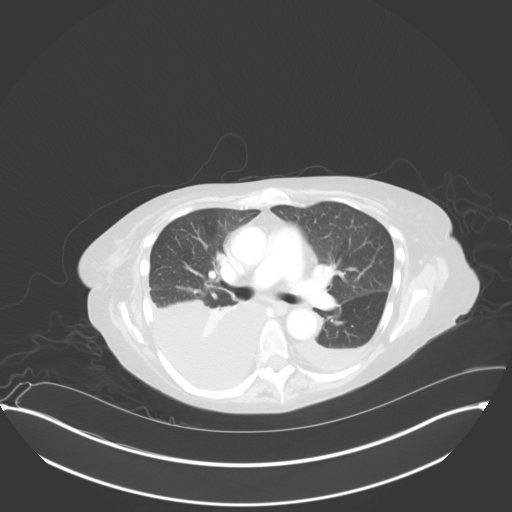
[im 115/127  lung]
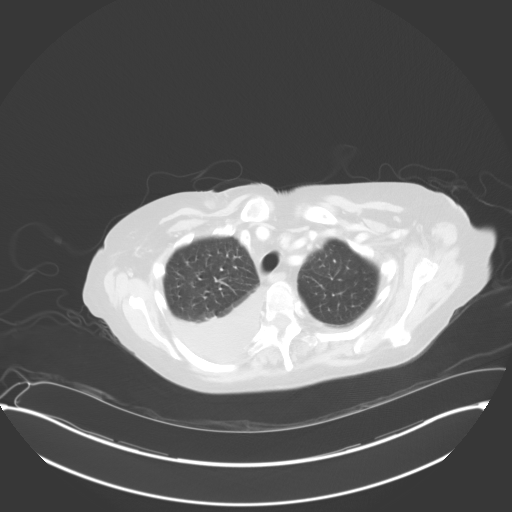

[Series 5: coronals · coronal · 0.77mm/px · 3 of 137 slices shown]
[im 28/137  lung]
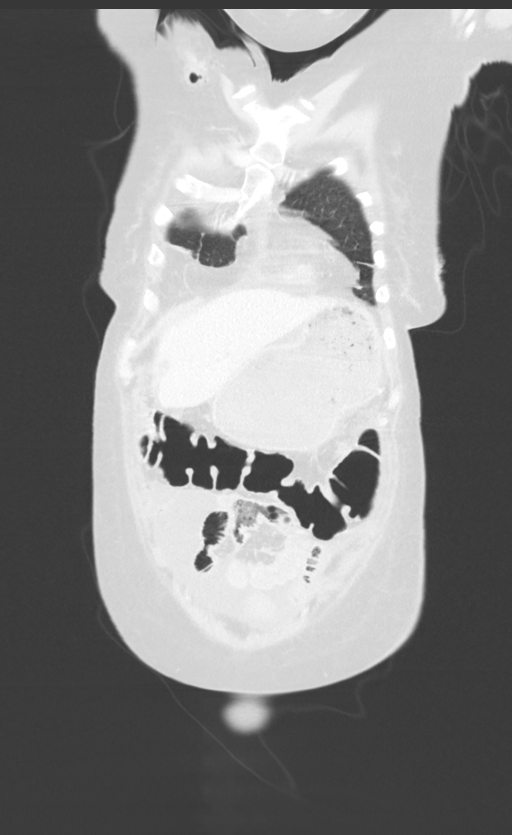
[im 55/137  lung]
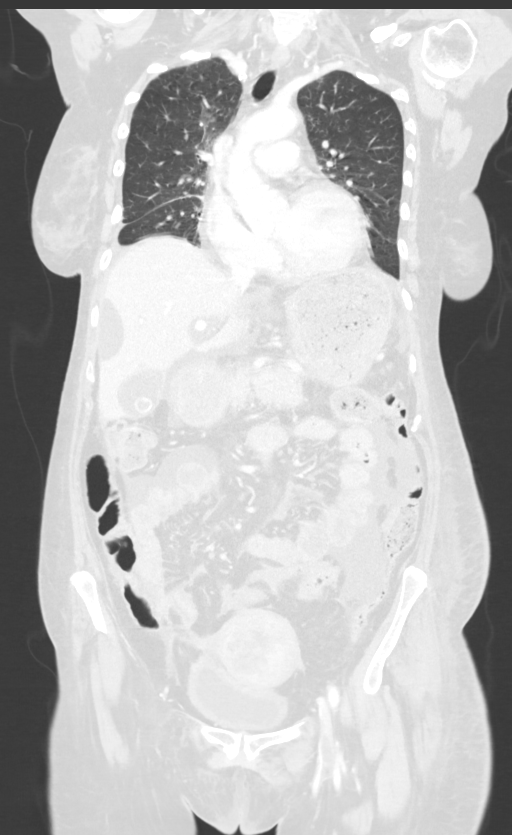
[im 82/137  lung]
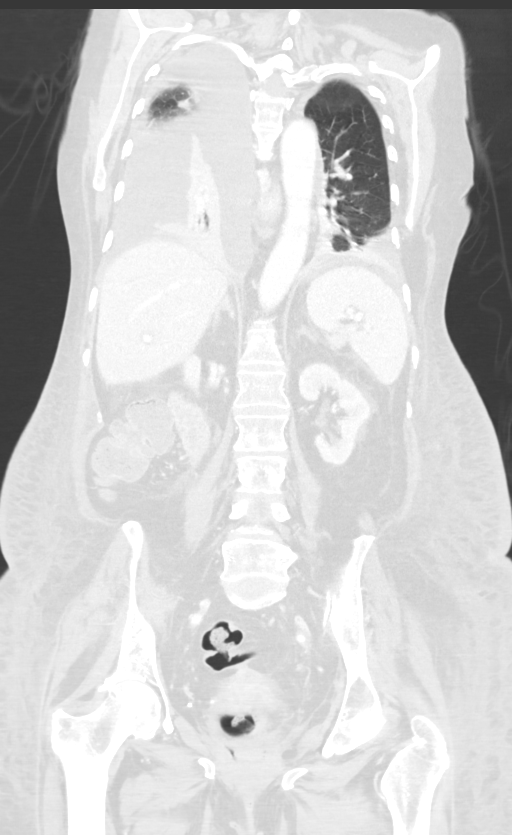

[13 of 36 positions shown; findings below may reference images not displayed]

FINDINGS: CT CHEST FINDINGS

Cardiovascular: No acute findings.

Mediastinum/Lymph Nodes: No masses or pathologically enlarged lymph
nodes identified.

Lungs/Pleura: Increased size of moderate right and small left
pleural effusions since previous study. Increased compressive
atelectasis in both lower lobes. A few scattered sub-cm pulmonary
nodules in both upper lobes remain stable. No new or enlarging
pulmonary nodules or masses identified.

Musculoskeletal:  No suspicious bone lesions identified.

CT ABDOMEN AND PELVIS FINDINGS

Hepatobiliary: New approximately 1 cm low-attenuation lesions are
seen along the capsular surface the liver dome, suspicious for
capsular tumor implants. Subcapsular cyst in right hepatic lobe is
stable.

Multiple calcified gallstones are seen, without evidence of
cholecystitis or biliary ductal dilatation. Mild periportal edema is
again seen.

Pancreas:  No mass or inflammatory changes.

Spleen:  Within normal limits in size and appearance.

Adrenals/Urinary tract:  No masses or hydronephrosis.

Stomach/Bowel: No evidence of obstruction, inflammatory process, or
abnormal fluid collections.

Vascular/Lymphatic: No pathologically enlarged lymph nodes
identified. No abdominal aortic aneurysm. Chronic DVT in the left
common femoral vein remains stable.

Reproductive: Heterogeneous soft tissue mass involving the uterine
body and cervix shows no significant change in size since previous
study. This measures 6.2 x 4.0 cm on image 105/2, compared to 6.1 x
4.3 cm previously. Deep myometrial invasion is seen, with probable
adjacent extra uterine extension. Adnexal regions are unremarkable.

Other: Mild ascites is increased since previous study. Soft tissue
thickening and nodularity involving the omental fat appears stable,
consistent with peritoneal carcinoma. Increased diffuse body wall
edema also noted.

Musculoskeletal:  No suspicious bone lesions identified.
IMPRESSION: Increased size of two 1 cm low-attenuation lesions along the
capsular surface of the liver dome, suspicious for peritoneal
carcinoma. Other areas of soft tissue density in the omental fat are
unchanged, consistent with peritoneal carcinomatosis.

No significant change in soft tissue mass in the lower uterine
segment and cervix, with probable adjacent extra-uterine extension.

Increased bilateral pleural effusions, mild ascites, and diffuse
body wall edema.

Stable tiny indeterminate sub-cm bilateral upper lobe pulmonary
nodules. Recommend continued follow-up by chest CT in 6 months.

Stable appearance of chronic DVT in left common femoral vein.

Cholelithiasis.  No radiographic evidence of cholecystitis.
